# Patient Record
Sex: Male | Born: 1935 | Race: White | Hispanic: No | Marital: Married | Smoking: Former smoker
Health system: Southern US, Community
[De-identification: ages and names within clinical notes are randomized; demographics above are authoritative.]

## PROBLEM LIST (undated history)

## (undated) DIAGNOSIS — K209 Esophagitis, unspecified without bleeding: Secondary | ICD-10-CM

## (undated) DIAGNOSIS — R0989 Other specified symptoms and signs involving the circulatory and respiratory systems: Secondary | ICD-10-CM

## (undated) DIAGNOSIS — R0602 Shortness of breath: Secondary | ICD-10-CM

## (undated) DIAGNOSIS — K529 Noninfective gastroenteritis and colitis, unspecified: Secondary | ICD-10-CM

## (undated) DIAGNOSIS — E785 Hyperlipidemia, unspecified: Secondary | ICD-10-CM

## (undated) DIAGNOSIS — H9190 Unspecified hearing loss, unspecified ear: Secondary | ICD-10-CM

## (undated) DIAGNOSIS — I219 Acute myocardial infarction, unspecified: Secondary | ICD-10-CM

## (undated) DIAGNOSIS — I10 Essential (primary) hypertension: Secondary | ICD-10-CM

## (undated) DIAGNOSIS — J449 Chronic obstructive pulmonary disease, unspecified: Secondary | ICD-10-CM

## (undated) DIAGNOSIS — R9430 Abnormal result of cardiovascular function study, unspecified: Secondary | ICD-10-CM

## (undated) DIAGNOSIS — K5730 Diverticulosis of large intestine without perforation or abscess without bleeding: Secondary | ICD-10-CM

## (undated) DIAGNOSIS — IMO0002 Reserved for concepts with insufficient information to code with codable children: Secondary | ICD-10-CM

## (undated) DIAGNOSIS — R9389 Abnormal findings on diagnostic imaging of other specified body structures: Secondary | ICD-10-CM

## (undated) DIAGNOSIS — G4730 Sleep apnea, unspecified: Secondary | ICD-10-CM

## (undated) DIAGNOSIS — Z720 Tobacco use: Secondary | ICD-10-CM

## (undated) DIAGNOSIS — I2510 Atherosclerotic heart disease of native coronary artery without angina pectoris: Secondary | ICD-10-CM

## (undated) HISTORY — DX: Reserved for concepts with insufficient information to code with codable children: IMO0002

## (undated) HISTORY — DX: Atherosclerotic heart disease of native coronary artery without angina pectoris: I25.10

## (undated) HISTORY — DX: Shortness of breath: R06.02

## (undated) HISTORY — PX: CHOLECYSTECTOMY: SHX55

## (undated) HISTORY — PX: CYST REMOVAL HAND: SHX6279

## (undated) HISTORY — PX: PERCUTANEOUS STENT INTERVENTION: SHX6019

## (undated) HISTORY — PX: APPENDECTOMY: SHX54

## (undated) HISTORY — DX: Tobacco use: Z72.0

## (undated) HISTORY — PX: CATARACT EXTRACTION: SUR2

## (undated) HISTORY — PX: CARDIAC CATHETERIZATION: SHX172

## (undated) HISTORY — DX: Essential (primary) hypertension: I10

## (undated) HISTORY — DX: Noninfective gastroenteritis and colitis, unspecified: K52.9

## (undated) HISTORY — DX: Other specified symptoms and signs involving the circulatory and respiratory systems: R09.89

## (undated) HISTORY — DX: Chronic obstructive pulmonary disease, unspecified: J44.9

## (undated) HISTORY — PX: TONSILLECTOMY: SUR1361

## (undated) HISTORY — DX: Abnormal result of cardiovascular function study, unspecified: R94.30

## (undated) HISTORY — DX: Abnormal findings on diagnostic imaging of other specified body structures: R93.89

## (undated) HISTORY — DX: Hyperlipidemia, unspecified: E78.5

---

## 2000-12-31 ENCOUNTER — Emergency Department (HOSPITAL_COMMUNITY): Admission: EM | Admit: 2000-12-31 | Discharge: 2000-12-31 | Payer: Self-pay | Admitting: Emergency Medicine

## 2000-12-31 ENCOUNTER — Encounter: Payer: Self-pay | Admitting: Emergency Medicine

## 2003-09-15 DIAGNOSIS — I2510 Atherosclerotic heart disease of native coronary artery without angina pectoris: Secondary | ICD-10-CM

## 2003-09-15 HISTORY — DX: Atherosclerotic heart disease of native coronary artery without angina pectoris: I25.10

## 2004-02-16 ENCOUNTER — Emergency Department (HOSPITAL_COMMUNITY): Admission: EM | Admit: 2004-02-16 | Discharge: 2004-02-16 | Payer: Self-pay | Admitting: Emergency Medicine

## 2004-02-18 ENCOUNTER — Inpatient Hospital Stay (HOSPITAL_COMMUNITY): Admission: EM | Admit: 2004-02-18 | Discharge: 2004-02-20 | Payer: Self-pay | Admitting: Emergency Medicine

## 2005-05-05 ENCOUNTER — Ambulatory Visit: Payer: Self-pay | Admitting: Cardiology

## 2005-05-13 ENCOUNTER — Ambulatory Visit: Payer: Self-pay | Admitting: Cardiology

## 2005-07-24 ENCOUNTER — Emergency Department (HOSPITAL_COMMUNITY): Admission: EM | Admit: 2005-07-24 | Discharge: 2005-07-24 | Payer: Self-pay | Admitting: Emergency Medicine

## 2006-07-16 ENCOUNTER — Ambulatory Visit: Payer: Self-pay | Admitting: Cardiology

## 2007-06-06 ENCOUNTER — Encounter: Payer: Self-pay | Admitting: Physician Assistant

## 2007-08-29 ENCOUNTER — Ambulatory Visit: Payer: Self-pay | Admitting: Cardiology

## 2008-10-03 ENCOUNTER — Ambulatory Visit: Payer: Self-pay | Admitting: Cardiology

## 2008-10-08 ENCOUNTER — Encounter: Payer: Self-pay | Admitting: Cardiology

## 2009-09-27 ENCOUNTER — Telehealth (INDEPENDENT_AMBULATORY_CARE_PROVIDER_SITE_OTHER): Payer: Self-pay | Admitting: *Deleted

## 2009-11-20 ENCOUNTER — Encounter: Payer: Self-pay | Admitting: Cardiology

## 2009-11-21 ENCOUNTER — Ambulatory Visit: Payer: Self-pay | Admitting: Cardiology

## 2010-01-06 ENCOUNTER — Telehealth (INDEPENDENT_AMBULATORY_CARE_PROVIDER_SITE_OTHER): Payer: Self-pay | Admitting: *Deleted

## 2010-01-07 ENCOUNTER — Ambulatory Visit: Payer: Self-pay | Admitting: Cardiology

## 2010-01-07 ENCOUNTER — Encounter (INDEPENDENT_AMBULATORY_CARE_PROVIDER_SITE_OTHER): Payer: Self-pay | Admitting: *Deleted

## 2010-01-07 LAB — CONVERTED CEMR LAB
Anion Gap: 11
BUN: 14 mg/dL
CO2: 27 meq/L
Calcium: 8.8 mg/dL
Chloride: 106 meq/L
Creatinine, Ser: 1.36 mg/dL
GFR calc non Af Amer: 52 mL/min
Glucose: 157 mg/dL
HCT: 44.9 %
Hemoglobin: 15.5 g/dL
MCHC: 34.6 g/dL
MCV: 88.8 fL
Platelets: 189 10*3/uL
Potassium: 3.6 meq/L
RBC: 5.06 M/uL
RDW: 14.4 %
Sodium: 140 meq/L
TSH: 0.59 microintl units/mL
WBC: 7.3 10*3/uL

## 2010-01-10 ENCOUNTER — Ambulatory Visit: Payer: Self-pay | Admitting: Cardiology

## 2010-01-13 ENCOUNTER — Encounter: Payer: Self-pay | Admitting: Cardiology

## 2010-01-13 ENCOUNTER — Encounter (INDEPENDENT_AMBULATORY_CARE_PROVIDER_SITE_OTHER): Payer: Self-pay | Admitting: *Deleted

## 2010-01-14 ENCOUNTER — Encounter: Payer: Self-pay | Admitting: Cardiology

## 2010-01-28 ENCOUNTER — Encounter: Payer: Self-pay | Admitting: Cardiology

## 2010-01-29 ENCOUNTER — Ambulatory Visit: Payer: Self-pay | Admitting: Cardiology

## 2010-09-18 ENCOUNTER — Ambulatory Visit (HOSPITAL_COMMUNITY)
Admission: RE | Admit: 2010-09-18 | Discharge: 2010-09-18 | Payer: Self-pay | Source: Home / Self Care | Attending: Ophthalmology | Admitting: Ophthalmology

## 2010-10-14 NOTE — Miscellaneous (Signed)
  Clinical Lists Changes  Problems: Added new problem of COPD (ICD-496) Observations: Added new observation of PAST MED HX: HYPERTENSION, UNSPECIFIED (ICD-401.9) HYPERLIPIDEMIA-MIXED (ICD-272.4) CAD...anterior MI with stent to LAD  1991 /  stent  1996  /  nuclear  no ischemia...2005 EF  55%.....echo  2005 /  55-60%....echo...01/10/2010...could not estimate RV pressure Tobacco abuse SOB  .Marland KitchenMarland KitchenCOPD Abnormal CXRay  12/2009...Marland KitchenMarland KitchenChest CT mod/severe COPD (no mediastinal abnormality)  (01/28/2010 20:29) Added new observation of PRIMARY MD: Dr. Thalia Party McInnis (01/28/2010 20:29)       Past History:  Past Medical History: HYPERTENSION, UNSPECIFIED (ICD-401.9) HYPERLIPIDEMIA-MIXED (ICD-272.4) CAD...anterior MI with stent to LAD  1991 /  stent  1996  /  nuclear  no ischemia...2005 EF  55%.....echo  2005 /  55-60%....echo...01/10/2010...could not estimate RV pressure Tobacco abuse SOB  .Marland KitchenMarland KitchenCOPD Abnormal CXRay  12/2009...Marland KitchenMarland KitchenChest CT mod/severe COPD (no mediastinal abnormality)

## 2010-10-14 NOTE — Assessment & Plan Note (Signed)
Summary: 2 WK   Visit Type:  Follow-up Primary Provider:  Dr. Butch Penny  CC:  shortness of breath.  History of Present Illness: The patient is seen for followup of shortness of breath.  I had seen him on January 07 2010.  At that time we talked about his smoking.  He told me that he would quit.  Today he returns and says that he has not smoked. He admits to the use of a very rare chewing tobacco and says he will complete the quit.  We had ordered a chest x-ray that was read as possibly abnormal.  Followup CT scan was done showing no definite tumor. Echocardiogram was done showing good LV function.  Right heart pressures could not be estimated.  The patient feels much better.  Current Medications (verified): 1)  Lisinopril 10 Mg Tabs (Lisinopril) .... Take 1 Tablet By Mouth Once A Day. Hold For Now. 2)  Simvastatin 40 Mg Tabs (Simvastatin) .... Take 1 Tablet By Mouth Once A Day 3)  Metoprolol Tartrate 50 Mg Tabs (Metoprolol Tartrate) .... Take 1/2 Tablet By Mouth Once A Day 4)  Aspirin 81 Mg Tbec (Aspirin) .... Take One Tablet By Mouth Daily 5)  Isosorbide Mononitrate Cr 60 Mg Xr24h-Tab (Isosorbide Mononitrate) .... Take One Tablet By Mouth Daily. Hold For Now.  Allergies (verified): No Known Drug Allergies  Comments:  Nurse/Medical Assistant: The patient's medications and allergies were reviewed with the patient and were updated in the Medication and Allergy Lists. Bottles reviewed.  Past History:  Past Medical History: Last updated: 01/28/2010 HYPERTENSION, UNSPECIFIED (ICD-401.9) HYPERLIPIDEMIA-MIXED (ICD-272.4) CAD...anterior MI with stent to LAD  1991 /  stent  1996  /  nuclear  no ischemia...2005 EF  55%.....echo  2005 /  55-60%....echo...01/10/2010...could not estimate RV pressure Tobacco abuse SOB  .Marland KitchenMarland KitchenCOPD Abnormal CXRay  12/2009...Marland KitchenMarland KitchenChest CT mod/severe COPD (no mediastinal abnormality)  Review of Systems       Patient denies fever, chills, headache, sweats, rash,  change in vision, change in hearing, chest pain, cough, nausea vomiting, urinary symptoms.  All of the systems are reviewed and are negative  Vital Signs:  Patient profile:   75 year old male Height:      72 inches Weight:      201 pounds Pulse rate:   66 / minute BP sitting:   116 / 80  (left arm) Cuff size:   large  Vitals Entered By: Carlye Grippe (Jan 29, 2010 1:28 PM)  Physical Exam  General:  patient is stable. Eyes:  no xanthelasma. Neck:  no jugular venous distention. Lungs:  lungs reveal decreased breath sounds. Heart:  cardiac exam reveals S1 and S2.  There no clicks or significant murmurs. Abdomen:  The abdomen is soft. Extremities:  no peripheral edema. Psych:  patient is oriented to person time and place.  Affect is normal.   Impression & Recommendations:  Problem # 1:  COPD (ICD-496) By CT scanning the patient appears to have significant COPD.  He should follow with his primary team concerning this.  He feels well currently  Problem # 2:  SHORTNESS OF BREATH (ICD-786.05)  His updated medication list for this problem includes:    Lisinopril 10 Mg Tabs (Lisinopril) .Marland Kitchen... Take 1 tablet by mouth once a day. hold for now.    Metoprolol Tartrate 50 Mg Tabs (Metoprolol tartrate) .Marland Kitchen... Take 1/2 tablet by mouth once a day    Aspirin 81 Mg Tbec (Aspirin) .Marland Kitchen... Take one tablet by mouth daily I believe  that his recent shortness of breath was related to his pulmonary disease.  He has good LV function.  Problem # 3:  HYPERTENSION, UNSPECIFIED (ICD-401.9)  His updated medication list for this problem includes:    Lisinopril 10 Mg Tabs (Lisinopril) .Marland Kitchen... Take 1 tablet by mouth once a day. hold for now.    Metoprolol Tartrate 50 Mg Tabs (Metoprolol tartrate) .Marland Kitchen... Take 1/2 tablet by mouth once a day    Aspirin 81 Mg Tbec (Aspirin) .Marland Kitchen... Take one tablet by mouth daily Blood pressure is under good control.  No change in therapy.  Problem # 4:  CAD, NATIVE VESSEL  (ICD-414.01)  His updated medication list for this problem includes:    Lisinopril 10 Mg Tabs (Lisinopril) .Marland Kitchen... Take 1 tablet by mouth once a day. hold for now.    Metoprolol Tartrate 50 Mg Tabs (Metoprolol tartrate) .Marland Kitchen... Take 1/2 tablet by mouth once a day    Aspirin 81 Mg Tbec (Aspirin) .Marland Kitchen... Take one tablet by mouth daily    Isosorbide Mononitrate Cr 60 Mg Xr24h-tab (Isosorbide mononitrate) .Marland Kitchen... Take one tablet by mouth daily. hold for now.    Nitrostat 0.4 Mg Subl (Nitroglycerin) .Marland Kitchen... 1 tablet under tongue at onset of chest pain; you may repeat every 5 minutes for up to 3 doses. Coronary disease is stable.  I've chosen not to do an exercise test at this point.  I will see him in followup and reconsider later.  Patient Instructions: 1)  Your physician wants you to follow-up in: 1 year. You will receive a reminder letter in the mail one-two months in advance. If you don't receive a letter, please call our office to schedule the follow-up appointment. 2)  Your physician recommends that you continue on your current medications as directed. Please refer to the Current Medication list given to you today. Prescriptions: NITROSTAT 0.4 MG SUBL (NITROGLYCERIN) 1 tablet under tongue at onset of chest pain; you may repeat every 5 minutes for up to 3 doses.  #25 x 3   Entered by:   Cyril Loosen, RN, BSN   Authorized by:   Talitha Givens, MD, [facility]   Signed by:   Cyril Loosen, RN, BSN on 01/29/2010   Method used:   Electronically to        [facility] # [phone]* (retail)       [address]       [place], [place]  [address]       Ph: [phone] or [phone]       Fax: [phone]   RxID:   [id number]

## 2010-10-14 NOTE — Progress Notes (Signed)
Summary: SOB   Phone Note Call from Patient   Summary of Call: Not feeling good x 2-3 days.  Does notice more in the evening.  Sleeps good.  Stated he has funny feeling in chest after coming back inside.  Not sure if related to breathing in the cold air or not.  No fever.  Cough occas, but not productive.  Does notice some SOB after this occas.  Did schedule appt. for first available (March 10).  Did suggest seeing his PMD first, but advised him that we would send note to MD also.  Patient verbalized understanding.  Initial call taken by: Hoover Brunette, LPN,  September 27, 2009 9:59 AM  Follow-up for Phone Call        Agree  Talitha Givens, MD, [facility]  Jan  Additional Follow-up for Phone Call Additional follow up Details #1::        Pt.'s wife notified.  Additional Follow-up by: Hoover Brunette, LPN,  October 02, 2009 9:04 AM

## 2010-10-14 NOTE — Assessment & Plan Note (Signed)
Summary: 1 YR FU PER JAN REMINDER-SRS   Visit Type:  Follow-up Primary Provider:  Dr. Butch Penny  CC:  CAD.  History of Present Illness: Patient is seen for followup of coronary disease.  He is stable.  He has not had any significant chest pain.  He did have an episode of shingles that was treated early.  He is going about full activities.  He stopped smoking 20 years ago.  He does continue to chew tobacco very infrequently and I encouraged him to stop completely.  Preventive Screening-Counseling & Management  Alcohol-Tobacco     Smoking Status: quit     Year Started: 1949     Year Quit: 1990  Comments: Pt chews tobacco. 1/2 pk of tobacco/day  Current Medications (verified): 1)  Alprazolam 0.5 Mg Tabs (Alprazolam) .... Take 1/2-1 Tablet By Mouth At Bedtime 2)  Lisinopril 10 Mg Tabs (Lisinopril) .... Take 1 Tablet By Mouth Once A Day 3)  Simvastatin 40 Mg Tabs (Simvastatin) .... Take 1 Tablet By Mouth Once A Day 4)  Metoprolol Tartrate 50 Mg Tabs (Metoprolol Tartrate) .... Take 1/2 Tablet By Mouth Once A Day 5)  Aspirin 81 Mg Tbec (Aspirin) .... Take One Tablet By Mouth Daily 6)  Isosorbide Mononitrate Cr 60 Mg Xr24h-Tab (Isosorbide Mononitrate) .... Take One Tablet By Mouth Daily 7)  Valacyclovir Hcl 1 Gm Tabs (Valacyclovir Hcl) .... Take 1 Tablet By Mouth Three Times A Day For Shingles  Allergies (verified): No Known Drug Allergies  Comments:  Nurse/Medical Assistant: The patient's medications were reviewed with the patient and were updated in the Medication List. Pt brought medication bottles to office visit.  Cyril Loosen, RN, BSN (November 21, 2009 1:46 PM)  Past History:  Past Medical History: Last updated: 11/20/2009 HYPERTENSION, UNSPECIFIED (ICD-401.9) HYPERLIPIDEMIA-MIXED (ICD-272.4) CAD...anterior MI with stent to LAD  1991 /  stent  1996  /  nuclear  no ischemia...2005 EF  55%.....echo  2005 Tobacco abuse  Social History: Smoking Status:  quit  Review  of Systems       Patient denies fever, chills, headache, sweats, rash, change in vision, change in hearing, chest pain, cough, nausea vomiting, urinary symptoms.  All other systems are reviewed and are negative.  Vital Signs:  Patient profile:   75 year old male Height:      72 inches Weight:      203.25 pounds BMI:     27.67 Pulse rate:   69 / minute BP sitting:   114 / 67  (left arm) Cuff size:   regular  Vitals Entered By: Cyril Loosen, RN, BSN (November 21, 2009 1:40 PM)  Nutrition Counseling: Patient's BMI is greater than 25 and therefore counseled on weight management options. CC: CAD Comments Follow up visit   Physical Exam  General:  patient is stable in general. Eyes:  no xanthelasma. Neck:  no jugular venous distention. Lungs:  lungs are clear.  Respiratory effort is not labored. Heart:  cardiac exam reveals S1-S2.  No clicks or significant murmurs. Abdomen:  abdomen is soft. Extremities:  no peripheral edema. Psych:  patient is oriented to person time and place.  Affect is normal.   Impression & Recommendations:  Problem # 1:  TOBACCO ABUSE (ICD-305.1) The patient stopped smoking 20 years ago.  He does chew tobacco rarely.  I counseled him to stop completely.  Problem # 2:  HYPERLIPIDEMIA-MIXED (ICD-272.4)  His updated medication list for this problem includes:    Simvastatin 40 Mg Tabs (Simvastatin) .Marland KitchenMarland KitchenMarland KitchenMarland Kitchen  Take 1 tablet by mouth once a day Patient's lipids are being treated.  No change in therapy.  Problem # 3:  CAD, NATIVE VESSEL (ICD-414.01)  His updated medication list for this problem includes:    Lisinopril 10 Mg Tabs (Lisinopril) .Marland Kitchen... Take 1 tablet by mouth once a day    Metoprolol Tartrate 50 Mg Tabs (Metoprolol tartrate) .Marland Kitchen... Take 1/2 tablet by mouth once a day    Aspirin 81 Mg Tbec (Aspirin) .Marland Kitchen... Take one tablet by mouth daily    Isosorbide Mononitrate Cr 60 Mg Xr24h-tab (Isosorbide mononitrate) .Marland Kitchen... Take one tablet by mouth  daily  Orders: EKG w/ Interpretation (93000)  Artery disease is stable.  Is not having any significant symptoms.  I feel that exercise testing is not necessary.  EKG is done today and reviewed by me.  There is no significant change.  Follow up in one year.  Patient Instructions: 1)  Your physician recommends that you continue on your current medications as directed. Please refer to the Current Medication list given to you today. 2)  Your physician wants you to follow-up in: . You will receive a reminder letter in the mail about two months in advance. If you don't receive a letter, please call our office to schedule the follow-up appointment.

## 2010-10-14 NOTE — Assessment & Plan Note (Signed)
Summary: ROV   Visit Type:  SOB Primary Provider:  Dr. Butch Penny  CC:  sob and dizziness.  History of Present Illness: The patient is here today as an add-on.  His wife called and stated he had some shortness of breath.  He had some shortness of breath at rest.  It seems to be most notable when he is in his recliner.  It is not affecting his activity.  He does not have true PND or orthopnea.  He has a history of good LV function.  His last echo was done in 2005.  He has coronary disease.  His last nuclear study exercise test was in 2005.  He's not had any significant chest pain.  Patient has also had generalized fatigue.  However he seems to remain active.  Preventive Screening-Counseling & Management  Alcohol-Tobacco     Smoking Status: quit     Year Quit: 1990  Current Medications (verified): 1)  Alprazolam 0.5 Mg Tabs (Alprazolam) .... Take 1/2-1 Tablet By Mouth At Bedtime 2)  Lisinopril 10 Mg Tabs (Lisinopril) .... Take 1 Tablet By Mouth Once A Day 3)  Simvastatin 40 Mg Tabs (Simvastatin) .... Take 1 Tablet By Mouth Once A Day 4)  Metoprolol Tartrate 50 Mg Tabs (Metoprolol Tartrate) .... Take 1/2 Tablet By Mouth Once A Day 5)  Aspirin 81 Mg Tbec (Aspirin) .... Take One Tablet By Mouth Daily 6)  Isosorbide Mononitrate Cr 60 Mg Xr24h-Tab (Isosorbide Mononitrate) .... Take One Tablet By Mouth Daily  Allergies (verified): No Known Drug Allergies  Past History:  Past Medical History: Last updated: 11/20/2009 HYPERTENSION, UNSPECIFIED (ICD-401.9) HYPERLIPIDEMIA-MIXED (ICD-272.4) CAD...anterior MI with stent to LAD  1991 /  stent  1996  /  nuclear  no ischemia...2005 EF  55%.....echo  2005 Tobacco abuse  Review of Systems       Patient denies fever, chills, headache, sweats, rash, change in vision, change in hearing, chest pain, cough, nausea vomiting, urinary symptoms.  All of the systems are reviewed and are negative.  Vital Signs:  Patient profile:   75 year old  male Height:      72 inches Weight:      199 pounds Pulse rate:   67 / minute Pulse (ortho):   73 / minute BP sitting:   98 / 65  (left arm) BP standing:   102 / 68 Cuff size:   large  Vitals Entered By: Carlye Grippe (January 07, 2010 1:59 PM)  Serial Vital Signs/Assessments:  Time      Position  BP       Pulse  Resp  Temp     By           Lying LA  113/67   69                    Carlye Grippe           Sitting   104/68   [address]           Standing  102/68   73                    Carlye Grippe           Standing  103/71   68  Carlye Grippe           Standing  469/62   95                    Carlye Grippe  CC: sob,dizziness   Physical Exam  General:  The patient is stable today. Head:  head is atraumatic. Eyes:  no xanthelasma. Neck:  no jugular venous distention.  No carotid bruits. Chest Wall:  no chest wall tenderness. Lungs:  lungs are clear.  Respiratory effort is nonlabored. Heart:  cardiac exam reveals S1 and S2.  No clicks or significant murmurs. Abdomen:  abdomen is soft. Msk:  no musculoskeletal deformities. Extremities:  no peripheral edema. Skin:  no skin rashes. Psych:  patient is oriented to person time and place.  Affect is normal.   Impression & Recommendations:  Problem # 1:  TOBACCO ABUSE (ICD-305.1) Patient has not smoked for greater than 20 years.  He does chew tobacco infrequently.  Today I asked him to stop, he said he would.  Problem # 2:  HYPERTENSION, UNSPECIFIED (ICD-401.9)  His updated medication list for this problem includes:    Lisinopril 10 Mg Tabs (Lisinopril) .Marland Kitchen... Take 1 tablet by mouth once a day. hold for now.    Metoprolol Tartrate 50 Mg Tabs (Metoprolol tartrate) .Marland Kitchen... Take 1/2 tablet by mouth once a day    Aspirin 81 Mg Tbec (Aspirin) .Marland Kitchen... Take one tablet by mouth daily Historically the patient has been hypertensive.  He has relative hypotension now.  We will put his lisinopril  and Imdur on hold.  Orders: T-CBC No Diff ([id number]) T-Basic Metabolic Panel ([id number]) T-TSH ([id number])  Problem # 3:  HYPERLIPIDEMIA-MIXED (ICD-272.4)  His updated medication list for this problem includes:    Simvastatin 40 Mg Tabs (Simvastatin) .Marland Kitchen... Take 1 tablet by mouth once a day The patient's lipids are being treated.  Problem # 4:  CAD, NATIVE VESSEL (ICD-414.01)  His updated medication list for this problem includes:    Lisinopril 10 Mg Tabs (Lisinopril) .Marland Kitchen... Take 1 tablet by mouth once a day. hold for now.    Metoprolol Tartrate 50 Mg Tabs (Metoprolol tartrate) .Marland Kitchen... Take 1/2 tablet by mouth once a day    Aspirin 81 Mg Tbec (Aspirin) .Marland Kitchen... Take one tablet by mouth daily    Isosorbide Mononitrate Cr 60 Mg Xr24h-tab (Isosorbide mononitrate) .Marland Kitchen... Take one tablet by mouth daily. hold for now.  Orders: EKG w/ Interpretation (93000) T-Chest x-ray, 2 views (71020) T-CBC No Diff ([id number]) T-Basic Metabolic Panel ([id number]) T-TSH ([id number]) 2-D Echocardiogram (2D Echo) Patient has known coronary disease.  His last nuclear scan was done in 2005.  I will consider a followup nuclear scan after I see him next.  Problem # 5:  SHORTNESS OF BREATH (ICD-786.05)  His updated medication list for this problem includes:    Lisinopril 10 Mg Tabs (Lisinopril) .Marland Kitchen... Take 1 tablet by mouth once a day. hold for now.    Metoprolol Tartrate 50 Mg Tabs (Metoprolol tartrate) .Marland Kitchen... Take 1/2 tablet by mouth once a day    Aspirin 81 Mg Tbec (Aspirin) .Marland Kitchen... Take one tablet by mouth daily This is a new problem for him.  He has this at rest.  I'm not convinced that he is fluid overloaded.  I am not convinced that this represents heart failure or ischemia.  EKG is done today and reviewed by me.  There is sinus rhythm with no acute changes.  Etiology is not clear.  Patient will have  chemistry and hematology and a TSH.  We'll also have chest x-ray and 2-D echo.  All then see him for a  followup.  Orders: T-Chest x-ray, 2 views (71020) T-CBC No Diff ([id number]) T-Basic Metabolic Panel ([id number]) T-TSH ([id number]) 2-D Echocardiogram (2D Echo)  Patient Instructions: 1)  Your physician recommends that you go to the [facility] for lab work. 2)  A chest x-ray takes a picture of the organs and structures inside the chest, including the heart, lungs, and blood vessels. This test can show several things, including, whether the heart is enlarged; whether fluid is building up in the lungs; and whether pacemaker / defibrillator leads are still in place. 3)  Your physician has requested that you have an echocardiogram.  Echocardiography is a painless test that uses sound waves to create images of your heart. It provides your doctor with information about the size and shape of your heart and how well your heart's chambers and valves are working.  This procedure takes approximately one hour. There are no restrictions for this procedure. 4)  Hold Lisinopril and Imdur (isosorbide) for now. 5)  Follow up appointment in 2 weeks.

## 2010-10-14 NOTE — Miscellaneous (Signed)
Summary: LABS ENTERED IN FLOWSHEET  Clinical Lists Changes  Observations: Added new observation of MCHC RBC: 34.6 g/dL (16/06/9603 5:40) Added new observation of PLATELETK/UL: 189 K/uL (01/07/2010 9:45) Added new observation of RDW: 14.4 % (01/07/2010 9:45) Added new observation of MCV: 88.8 fL (01/07/2010 9:45) Added new observation of HCT: 44.9 % (01/07/2010 9:45) Added new observation of HGB: 15.5 g/dL (98/07/9146 8:29) Added new observation of RBC M/UL: 5.06 M/uL (01/07/2010 9:45) Added new observation of WBC COUNT: 7.3 10*3/microliter (01/07/2010 9:45) Added new observation of TSH: 0.59 microintl units/mL (01/07/2010 9:45) Added new observation of CALCIUM: 8.8 mg/dL (56/21/3086 5:78) Added new observation of GFR: 52 mL/min (01/07/2010 9:45) Added new observation of CREATININE: 1.36 mg/dL (46/96/2952 8:41) Added new observation of BUN: 14 mg/dL (32/44/0102 7:25) Added new observation of ANION GAP: 11.0  (01/07/2010 9:45) Added new observation of CO2 PLSM/SER: 27.0 meq/L (01/07/2010 9:45) Added new observation of CL SERUM: 106 meq/L (01/07/2010 9:45) Added new observation of K SERUM: 3.6 meq/L (01/07/2010 9:45) Added new observation of NA: 140 meq/L (01/07/2010 9:45) Added new observation of GLU MON POC: 157 mg/dL (36/64/4034 7:42)

## 2010-10-14 NOTE — Progress Notes (Signed)
Summary: Patient is SOB  Phone Note Call from Patient Call back at Home Phone [phone] Call back at [phone]   Caller: wife calling Reason for Call: Talk to Nurse Summary of Call: complaint SOB from walking.   Patient's wife feels he needs to see someone does not want to take to his primary.  No other complaints related to his heart.   Initial call taken by: Claudette Laws,  January 06, 2010 12:25 PM  Follow-up for Phone Call        SOB since Friday with activity only. Patient didn't have problem on Sunday,denies chest pain. Paient is working out in garden today. Nurse advised  her that he did need to be evaluated either by PCP or ED. Wife insist that patient see My[facility]. Wife verbalized understanding of plan. Patient is willing to come to [place] to see MD if possible. Follow-up by: Carlye Grippe,  January 06, 2010 1:10 PM  Additional Follow-up for Phone Call Additional follow up Details #1::        Have him come to office this afternoon. I will see him. Call me to tell me if / when  he is coming.    Additional Follow-up for Phone Call Additional follow up Details #2::    called and informd wife  to have patient here today at 1:30pm.  Follow-up by: Carlye Grippe,  January 07, 2010 11:59 AM

## 2010-10-14 NOTE — Miscellaneous (Signed)
Summary: CT CHEST WITH CONTRAST  Clinical Lists Changes  Orders: Added new Referral order of CT Scan with Contrast (CTw/contrast) - Signed

## 2010-10-14 NOTE — Miscellaneous (Signed)
  Clinical Lists Changes  Problems: Added new problem of TOBACCO ABUSE (ICD-305.1) Observations: Added new observation of PAST MED HX: HYPERTENSION, UNSPECIFIED (ICD-401.9) HYPERLIPIDEMIA-MIXED (ICD-272.4) CAD...anterior MI with stent to LAD  1991 /  stent  1996  /  nuclear  no ischemia...2005 EF  55%.....echo  2005 Tobacco abuse (11/20/2009 17:16) Added new observation of PRIMARY MD: McInnis (11/20/2009 17:16)       Past History:  Past Medical History: HYPERTENSION, UNSPECIFIED (ICD-401.9) HYPERLIPIDEMIA-MIXED (ICD-272.4) CAD...anterior MI with stent to LAD  1991 /  stent  1996  /  nuclear  no ischemia...2005 EF  55%.....echo  2005 Tobacco abuse

## 2010-11-24 LAB — BASIC METABOLIC PANEL
GFR calc Af Amer: >60 mL/min (ref >60)
GFR calc non Af Amer: 58 mL/min — ABNORMAL LOW (ref >60)
Glucose, Bld: 118 mg/dL — ABNORMAL HIGH (ref 70–99)
Potassium: 3.6 mEq/L (ref 3.5–5.1)
Sodium: 142 mEq/L (ref 135–145)

## 2010-11-24 LAB — HEMOGLOBIN AND HEMATOCRIT, BLOOD
HCT: 40.4 % (ref 39.0–52.0)
Hemoglobin: 14.6 g/dL (ref 13.0–17.0)

## 2011-01-09 ENCOUNTER — Other Ambulatory Visit: Payer: Self-pay | Admitting: Cardiology

## 2011-01-09 ENCOUNTER — Other Ambulatory Visit: Payer: Self-pay | Admitting: *Deleted

## 2011-01-09 MED ORDER — SIMVASTATIN 40 MG PO TABS: 40.0000 mg | ORAL_TABLET | Freq: Every evening | ORAL | Status: DC

## 2011-01-09 MED ORDER — METOPROLOL TARTRATE 50 MG PO TABS: 50.0000 mg | ORAL_TABLET | Freq: Every day | ORAL | Status: DC

## 2011-01-27 NOTE — Assessment & Plan Note (Signed)
[facility]                          [facility]   NAME:Jeremy [name]                       MRN:          [id number]  DATE:08/29/2007                            DOB:          08-24-1936    [name] has known coronary disease. I saw him last in 2007. He is  here for a yearly followup. He is not having any chest pain. He is  active. He is going about full activities. He has stopped smoking.  However, I understand today that he is chewing tobacco and he and his  wife had a long discussion about this. He thought he had been told that  chewing tobacco did not have the negative effects of smoking and I  explained that it would have less effect on lung cancer and other  issues, but that from a cardiac viewpoint, the nicotine was still  harmful to his endocardium.   PAST MEDICAL HISTORY:   ALLERGIES:  No known drug allergies.   MEDICATIONS:  1. Fish oil.  2. Isosorbide.  3. Lopressor.  4. Simvastatin.  5. Lisinopril.  6. Aspirin.   OTHER MEDICAL PROBLEMS:  See the list below.   REVIEW OF SYSTEMS:  He is going about full activity. He has a mild  cough, but is stable in that regard. His review of systems otherwise is  negative.   PHYSICAL EXAMINATION:  Weight is 201 pounds, blood pressure 118/72 with  a pulse of 57. The patient is oriented to person, time and place. Affect  is normal. He is here with his wife today.  LUNGS:  Clear. Respiratory effort is not labored.  CARDIAC: Reveals an S1, S2. There are no clicks or significant murmurs.  ABDOMEN: Soft. There are no masses or bruits.  He has no peripheral edema.   EKG: Reveals mild sinus bradycardia.   PROBLEM LIST:  1. Coronary disease status post anterior myocardial infarction with      stent to the left anterior descending artery in 1991.  2. Status post stenting again to the left anterior descending artery      again in 1996.  3. Cardiolite in 2005 with no ischemia.  4. Dyslipidemia on medications.  5. Borderline hypertension. His blood pressure is nicely controlled      today.  6. History of smoking in the past which he stopped. However, he does      chew tobacco and we had a long discussion about this. He does need      to have followup lipids done. He actually had labs in September      2008. His LDL was 84 and his HDL is 32. He is not at this point      inclined to use Niaspan. This should be considered over time. I      have chosen not to change his medications based on these labs.   [name] is stable. I have strongly urged him to stop chewing tobacco  and we will see him back in one year.     Luis Abed, MD, [facility]  Electronically Signed  JDK/MedQ  DD: 08/29/2007  DT: 08/29/2007  Job #: [id number]   cc:   Angus G. Renard Matter, MD

## 2011-01-27 NOTE — Assessment & Plan Note (Signed)
[facility]                          [facility]   NAME:Jeremy [name]                       MRN:          [id number]  DATE:10/03/2008                            DOB:          08-04-1936    [name] is here for Cardiology followup.  He has known coronary  artery disease.  I saw him last in December 2008.  At that time, I had  spoken with him about chewing tobacco.  We had another long discussion  about quitting today.  I documented well on my last note that we had  discussed this.  He does not remember.  He is taking his meds very  reliably.  He had an anterior MI with a stent to the LAD in 1991.  He  had no ischemia by Cardiolite in 2005.  His ejection fraction at that  time was good.  His last echo was also in 2005.   ALLERGIES:  No known drug allergies.   MEDICATIONS:  Fish oil, isosorbide, metoprolol, simvastatin, lisinopril,  and aspirin.   OTHER MEDICAL PROBLEMS:  See the list below.   REVIEW OF SYSTEMS:  He is not having any GI or GU symptoms.  He has no  fevers, skin rashes, or weight loss.  He has no headaches.  His review  of systems is negative.   PHYSICAL EXAMINATION:  VITAL SIGNS:  Blood pressure is 120/80 with a  pulse of 65.  His weight is 200.  GENERAL:  The patient is oriented to person, time, and place.  Affect is  normal.  HEENT:  No xanthelasma.  He has normal extraocular motion.  There are no  carotid bruits.  There is no jugular venous distention.  LUNGS:  Clear.  Respiratory effort is not labored.  CARDIAC:  S1 and S2.  There are no clicks or significant murmurs.  ABDOMEN:  Soft.  EXTREMITIES:  He has no peripheral edema.   EKG is unchanged.  He has some mild nonspecific ST-T wave changes.   PROBLEMS INCLUDE:  1. Coronary disease post anterior MI with a stent to the LAD in 1991.      He then had stenting again to the LAD in 1996.  2. Cardiolite in 2005.  No ischemia.  3. Dyslipidemia.  I have  arranged for fasting lipid profile.  4. Borderline hypertension.  Blood pressure is well controlled.  5. History of smoking.  He did stop smoking, but he continues to chew      tobacco.  I had an extensive discussion with the patient today.      With his wife in the room, we talked at length about stopping      smokeless tobacco.  He did not commit to this, but he said he would      try to improve.   Overall, he is doing well.  I have not arranged for any testing other  than his lipid profile.  This will be carefully reviewed when we obtain  it.  I will see him for Cardiology followup in 1 year.     Tinnie Gens  Sedonia Small, MD, [facility]  Electronically Signed    JDK/MedQ  DD: 10/03/2008  DT: 10/04/2008  Job #: [id number]   cc:   Angus G. Renard Matter, MD

## 2011-01-29 ENCOUNTER — Encounter: Payer: Self-pay | Admitting: Cardiology

## 2011-01-30 ENCOUNTER — Encounter: Payer: Self-pay | Admitting: Cardiology

## 2011-01-30 DIAGNOSIS — Z720 Tobacco use: Secondary | ICD-10-CM | POA: Insufficient documentation

## 2011-01-30 DIAGNOSIS — I10 Essential (primary) hypertension: Secondary | ICD-10-CM | POA: Insufficient documentation

## 2011-01-30 DIAGNOSIS — I2510 Atherosclerotic heart disease of native coronary artery without angina pectoris: Secondary | ICD-10-CM | POA: Insufficient documentation

## 2011-01-30 DIAGNOSIS — E782 Mixed hyperlipidemia: Secondary | ICD-10-CM | POA: Insufficient documentation

## 2011-01-30 DIAGNOSIS — R9389 Abnormal findings on diagnostic imaging of other specified body structures: Secondary | ICD-10-CM | POA: Insufficient documentation

## 2011-01-30 DIAGNOSIS — J449 Chronic obstructive pulmonary disease, unspecified: Secondary | ICD-10-CM | POA: Insufficient documentation

## 2011-01-30 DIAGNOSIS — E785 Hyperlipidemia, unspecified: Secondary | ICD-10-CM | POA: Insufficient documentation

## 2011-01-30 DIAGNOSIS — R9430 Abnormal result of cardiovascular function study, unspecified: Secondary | ICD-10-CM | POA: Insufficient documentation

## 2011-01-30 NOTE — Discharge Summary (Signed)
NAME:  Jeremy [name], Jeremy [name] NO.:  [id number]   MEDICAL RECORD NO.:  [id number]                   PATIENT TYPE:  INP   LOCATION:  A226                                 FACILITY:  [facility]   PHYSICIAN:  Angus G. Renard Matter, M.D.              DATE OF BIRTH:  04-Dec-1935   DATE OF ADMISSION:  02/18/2004  DATE OF DISCHARGE:  02/20/2004                                 DISCHARGE SUMMARY   DIAGNOSES:  1. Chest pain, possible unstable angina.  2. History of coronary artery disease status post stent placement.  3. Hyperlipidemia.  4. Allergic dermatitis.  5. Normocytic anemia.   DISCHARGE CONDITION:  The patient is stable and improved at the time of  discharge.   REASON FOR ADMISSION:  This is a 75 year old white male who has a history of  coronary artery disease and hyperlipidemia and came to the emergency room  with the above complaint of chest pain.  The patient claims to have had two  heart attacks ten to 12 years prior to this admission and had stent  placement.  On the day of admission he had three episodes of chest pain  which lasted a couple of minutes. He took nitroglycerin from a friend and  this relieved his pain. He came to the emergency room with another episode  of chest pain.  He had no nausea and no diaphoresis.  The patient's pain was  mainly in the substernal area and was described as pressure.  The patient  has had itching and has some type of dermatitis over his skin for the last  few days and was given Benadryl.   PHYSICAL EXAMINATION ON ADMISSION:  GENERAL:  Alert male.  VITAL SIGNS:  Blood pressure 115/79, pulse 90, respirations 20, temperature  98.8.  HEENT:  Eyes:  Pupils equal, round and reactive to light and accommodation.  TM's are negative. Oropharynx is benign.  The neck is supple without JVD or  thyroid abnormalities.  LUNGS:  Clear to percussion and auscultation.  HEART:  Regular rhythm with no murmurs.  ABDOMEN:  No palpable organs or  masses.  EXTREMITIES:  Free of edema.   LABORATORY DATA:  Admission CBC showed a WBC of 13,400, hemoglobin 15.8,  hematocrit 45.7.  Chemistries:  Sodium 142, potassium 3.4, chloride 108, C02  25, glucose 125, BUN 20, creatinine 1.4, calcium 8.8, total protein 6.8 and  albumin 4.0.  Liver enzymes:  SGOT 19, SGPT 16, alkaline phosphatase 70,  bilirubin 0.8.  CPK 24, CPK MB 1.5, Troponin 0.02.   Chest x-ray:  Questionable COPD, minimal right basilar atelectasis.  Electrocardiogram:  Normal sinus rhythm, rate 88, sinus bradycardia on one  EKG.   HOSPITAL COURSE:  The patient was placed on a low cholesterol diet.  Cardiac  enzymes were monitored. He was continued on aspirin 325 mg daily, p.r.n.  Nitrostat 0.4 mg and the patient was continued on Zocor  20 mg daily,  lisinopril 10 mg daily, isosorbide 60 mg daily, metoprolol 50 mg daily,  Pepcid 40 mg daily and Benadryl 50 mg p.r.n. for rash and itching.  The  patient had minimal chest pain following admission. He did have significant  symptoms secondary to dermatitis on his hands and trunk. He was given Solu-  Medrol 125 mg intravenously and subsequently prednisone 60 mg daily and  Aveeno bath was given p.r.n. He was placed on Zantac 150 mg b.i.d.  The  patient was seen in consultation by Dr. Dionicio Stall who felt that he should  be evaluated with reference to his heart, but this could be done as an  outpatient. He felt that the patient had a significant allergic contact type  dermatitis and also felt that the chest pain was very atypical.  He felt  that he should be aggressively treated for the rash and dermatitis.  He felt  that he could be followed up as an outpatient after leaving the hospital  with reference to the chest pain.  He was discharged on the second hospital  day.   DISCHARGE MEDICATIONS:  1. Allegra 180 mg daily.  2. Prednisone taper.  3. Zocor 20 mg at bedtime.  4. Lisinopril 10 mg daily.  5. Imdur 60 mg daily.  6.  Metoprolol 50 mg daily.  7. Aspirin 81 mg daily.  8. Pepcid 40 mg daily.  9. P.r.n. nitroglycerin.   FOLLOW UP:  1. A stress test was scheduled to be done in [place], [place].     ___________________________________________                                         Ishmael Holter. Renard Matter, M.D.   AGM/MEDQ  D:  03/03/2004  T:  03/03/2004  Job:  [id number]   cc:   Vida Roller, M.D.  Fax: [phone]

## 2011-01-30 NOTE — Group Therapy Note (Signed)
NAME:  Jeremy [name], CHARTERS NO.:  [id number]   MEDICAL RECORD NO.:  [id number]                   PATIENT TYPE:  INP   LOCATION:  A226                                 FACILITY:  [facility]   PHYSICIAN:  Angus G. Renard Matter, M.D.              DATE OF BIRTH:  [birth year]   DATE OF PROCEDURE:  DATE OF DISCHARGE:                                   PROGRESS NOTE   This patient was admitted through the ED with chest pain.  He was admitted  to rule out coronary artery disease.  He has had minimal chest pain since  admission.  He has had problems with itching, secondary to the dermatitis  which he has.  The stress test was put on hold because of the skin problems.   OBJECTIVE:  VITAL SIGNS:  Blood pressure 95/48; respirations 20; pulse 63;  temperature 97.  HEART:  Regular rhythm.  LUNGS:  Clear to P&A.  ABDOMEN:  No palpable organs or masses.   ASSESSMENT:  The patient has coronary artery disease and hyperlipidemia.   PLAN:  Continue steroids, continue current regimen.      ___________________________________________                                            Ishmael Holter. Renard Matter, M.D.   AGM/MEDQ  D:  02/20/2004  T:  02/20/2004  Job:  [id number]

## 2011-01-30 NOTE — H&P (Signed)
NAME:  Jeremy [name], MAKIN NO.:  [id number]   MEDICAL RECORD NO.:  [id number]                   PATIENT TYPE:  INP   LOCATION:  A226                                 FACILITY:  [facility]   PHYSICIAN:  Tesfaye D. Felecia Shelling, M.D.              DATE OF BIRTH:  [birth year]   DATE OF ADMISSION:  02/18/2004  DATE OF DISCHARGE:                                HISTORY & PHYSICAL   CHIEF COMPLAINT:  Chest pain.   HISTORY OF PRESENT ILLNESS:  This is a 75 year old male patient with a  history of coronary artery disease and hyperlipidemia who came to the  emergency room with the above complaint.  The patient claims he had two  heart attacks about 10 or 12 years back, and had stent placement.  He used  to have regular cardiology followup six months to one year.  He has been  taking his medication regularly, and he has never had chest pain recently.  Today, the patient had three episodes of chest pain which lasted for a  couple of minutes.  He took nitroglycerin from a friend, and his pain was  relieved.  He came to the emergency room and had another chest pain, and he  was given nitroglycerin, and the pain went away.  The patient had no nausea,  vomiting or diaphoresis.  The pain was mainly on substernal area, and it was  like pressure.  The patient also complains of thrush and itching for the  last three days.  He had severe rash and hives about three days and came  into the emergency room.  He was given Benadryl.  However, he continued to  have frequent itching and still the thrush has persisted.   REVIEW OF SYSTEMS:  No headache, fever, cough, shortness of breath, nausea,  vomiting, abdominal pain, dysuria, urgency or frequency of urination.   PAST MEDICAL HISTORY:  1. Coronary artery disease and history of myocardial infarction about 10 or     12 years back.  2. Status post stent placement.  3. Hyperlipidemia.  4. Status post cholecystectomy.   CURRENT MEDICATIONS:  1.  Zocor 20 mg p.o. daily.  2. Lisinopril 10 mg p.o. daily.  3. Isosorbide 60 mg p.o. daily.  4. Metoprolol 50 mg p.o. daily.  5. Pepcid 40 mg p.o. daily.  6. Benadryl 25 mg p.o. p.r.n. for rash and itching.   SOCIAL HISTORY:  The patient is married.  He chews tobacco.  No history of  tobacco smoking, alcohol abuse or substance abuse.  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   PHYSICAL EXAMINATION:  GENERAL:  The patient is alert, awake and pleasant  looking.  VITAL SIGNS:  Blood pressure 115/79, pulse 90, respiratory rate 22,  temperature 98.8 degrees Fahrenheit.  HEENT:  Pupils are equal, reactive.  NECK:  Supple.  CHEST:  Clear lung fields, good air  entry.  CARDIOVASCULAR SYSTEMS:  The first and second heart sounds heard. No murmur,  no gallop.  ABDOMEN:  Soft and relaxed.  Bowel sounds are positive.  No mass or  organomegaly.  EXTREMITIES:  No leg edema.   LABORATORY DATA:  CBC:  WBC 8.8, hemoglobin 10.5, hematocrit 31.4.  Platelets 119,000.  Sodium 134, potassium 4.1.  Chloride 100.  Carbon  dioxide 24.  Glucose 102,  BUN 21, creatinine 1.4.  Calcium 8.1.  PSA 716.   ASSESSMENT:  1. Chest pain to rule out unstable angina.  2. Coronary artery disease, status post stent placement.  3. Hyperlipidemia.  4. Allergic dermatitis.  5. Normocytic anemia, etiology unclear.  6. Elevated PSA.   PLAN:  1. EKG, cardiac enzymes q.8 x3.  2. Continue to monitor on telemetry.  3. Cardiology consultations.  4. Continue the patient on his regular medications.  5. Urology consultation for abnormal PSA.  6. Anemia workup.  7. Continue his regular treatment.     ___________________________________________                                         Eustaquio Maize. Felecia Shelling, M.D.   TDF/MEDQ  D:  02/18/2004  T:  02/18/2004  Job:  [id number]

## 2011-01-30 NOTE — Assessment & Plan Note (Signed)
[facility]                            [facility] NOTE   NAME:Jeremy [name], Jeremy [name]                       MRN:          [id number]  DATE:07/16/2006                            DOB:          Nov 12, 1935    PRIMARY CARDIOLOGIST:  Luis Abed, M.D.   REASON FOR VISIT:  Scheduled annual follow up.   Jeremy [name] is a very pleasant 75 year old male with known coronary artery  disease who now presents for annual follow up.  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   The patient denies any chest discomfort associated with his vigorous  activities.   Electrocardiogram today reveals normal sinus rhythm with left axis deviation  and small Q-waves in the high lateral leads with no ischemic changes.   CURRENT MEDICATIONS:  1. Aspirin 81 mg daily.  2. Imdur 60 daily.  3. Lisinopril 10 daily.  4. Simvastatin 40 q.h.s.  5. Metoprolol 25 daily.   PHYSICAL EXAMINATION:  Blood pressure 130/82, pulse 73, regular, weight 194.  GENERAL:  A 75 year old male sitting upright in no apparent distress.  HEENT:  Normocephalic and atraumatic.  NECK:  Palpable bilateral carotid pulses without bruits.  LUNGS:  Clear to auscultation in all fields.  HEART:  Regular rate and rhythm.  S1-S2.  No murmurs, gallops, or rubs.  ABDOMEN:  Soft and nontender with intact bowel sounds and no bruits or  pulsatile mass.  EXTREMITIES:  Palpable distal pulses without edema.  There are no focal  deficits.   IMPRESSION:  1. Coronary artery disease, status post anterior myocardial      infarction/stent left anterior descending artery.      a.     Status post anterior myocardial infarction in 1991.      b.     Status post stenting of left anterior descending artery in 1996.      c.     Non-ischemic stress Cardiolite with normal  left ventricular June       2005.  2. Dyslipidemia.  3. Borderline hypertension.  4. Remote tobacco.   PLAN:  1. Schedule annual follow-up fasting lipid/liver profile.  2. Return clinic follow up with Dr. Luis Abed in one year.      Gene Serpe, PA-C  Electronically Signed     ______________________________  Luis Abed, MD, [facility]   GS/MedQ  DD: 07/16/2006  DT: 07/17/2006  Job #: [id number]   cc:   Angus G. Renard Matter, MD

## 2011-01-30 NOTE — Group Therapy Note (Signed)
NAME:  Jeremy [name], Jeremy [name] NO.:  [id number]   MEDICAL RECORD NO.:  [id number]                   PATIENT TYPE:  INP   LOCATION:  A226                                 FACILITY:  [facility]   PHYSICIAN:  Angus G. Renard Matter, M.D.              DATE OF BIRTH:  [birth year]   DATE OF PROCEDURE:  DATE OF DISCHARGE:                                   PROGRESS NOTE   This patient was admitted through the ED with chest pain.  He was admitted  to rule out coronary artery disease.  His cardiac markers have remained  normal.   OBJECTIVE:  VITAL SIGNS:  Blood pressure 115/62; respirations 18; pulse 86;  temperature 97.3.  LUNGS:  Clear to P&A.  HEART:  Regular rhythm.  ABDOMEN:  No palpable organs or masses.   ASSESSMENT:  The patient was admitted with chest pain and hyperlipidemia to  rule out coronary artery disease.  The patient does have elevated PSA.   PLAN:  Proceed with a cardiology consult and urology consult.      ___________________________________________                                            Ishmael Holter. Renard Matter, M.D.   AGM/MEDQ  D:  02/19/2004  T:  02/19/2004  Job:  [id number]

## 2011-01-30 NOTE — Procedures (Signed)
NAME:  Jeremy [name], Jeremy [name] NO.:  [id number]   MEDICAL RECORD NO.:  [id number]                   PATIENT TYPE:  INP   LOCATION:  A226                                 FACILITY:  [facility]   PHYSICIAN:  Vida Roller, M.D.                DATE OF BIRTH:  1936/07/29   DATE OF PROCEDURE:  02/19/2004  DATE OF DISCHARGE:                                  ECHOCARDIOGRAM   PRIMARY CARE PHYSICIAN:  Angus G. Renard Matter, M.D.   Tape Number [id number].  Tape count 0 through 433.   A 75 year old man with known coronary artery disease comes in with chest  pain.  This is assessment for ischemia.  Technical quality of the study is  limited.   M-MODE TRACINGS:  1. The aorta is 35 mm.  2. Left atrium is 42 mm.  3. Septum is 14 mm.  4. Posterior wall is 13 mm.  5. Left ventricular diastolic dimension 50 mm.  6. Left ventricular systolic dimension 36 mm.   2-D AND DOPPLER IMAGING:  1. The left ventricle is normal size with preserved left ventricular     systolic function, estimated ejection fraction 50 to 55%.  There is mild     inferior hypokinesis which is difficult to assess due to the technical     limitations of the study.  Diastolic function was not adequately     assessed.  There is mild concentric left ventricular hypertrophy.  2. The right ventricle appears to be top normal in size with normal systolic     function.  3. Both atria appear to be mildly enlarged, left greater than right.  There     is no obvious atrial septal defect.  4. The aortic valve was sclerotic with no stenosis or regurgitation.  5. The mitral valve is morphologically unremarkable with trivial     insufficiency.  No stenosis is seen.  6. The tricuspid valve is morphologically unremarkable with trace     insufficiency, and no stenosis is seen.  7. Pulmonic valve not well seen.  8. Inferior vena cava appears to be normal size.  9. Pericardial structures appear normal.  10.      Aorta not well  assessed.      ___________________________________________                                            Vida Roller, M.D.   JH/MEDQ  D:  02/19/2004  T:  02/19/2004  Job:  [id number]

## 2011-01-30 NOTE — Consult Note (Signed)
NAME:  Jeremy [name], Jeremy [name] NO.:  [id number]   MEDICAL RECORD NO.:  [id number]                   PATIENT TYPE:  INP   LOCATION:  A226                                 FACILITY:  [facility]   PHYSICIAN:  Vida Roller, M.D.                DATE OF BIRTH:  [birth year]   DATE OF CONSULTATION:  02/19/2004  DATE OF DISCHARGE:                                   CONSULTATION   CARDIOLOGIST:  Willa Rough, M.D., primarily followed in [place].   HISTORY OF PRESENT ILLNESS:  Mr. Paver is a 75 year old male with known  coronary disease who presented to the emergency room complaining of chest  discomfort.  Reports three episodes of chest pain at home with exertion, two  at rest.  The episodes lasted about 4-5 minutes and were separated by about  an hour.  He did take a sublingual nitroglycerin with relief of the third  symptom.  Chest pain was described as pressure substernally without  radiation.  No exacerbating or relieving symptoms or diaphoresis.  He had  been told to stop his aspirin because he has a relatively severe cutaneous  reaction which he thinks was from exposure to grass.  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   PAST MEDICAL HISTORY:  1. Coronary disease, myocardial infarction back in 1990 and again in 1996.     Status post percutaneous revascularization at that time, the details of     which are unavailable to me.  Stress test two years ago which was normal,     also, not available to me.  2. Hyperlipidemia.  3. Hypertension.   SOCIAL HISTORY:  Lives in [place] with his wife.  He is a Visual merchandiser.  He is married.  He has three boys.  He used to smoke cigarettes, but he  quite after his second heart attack.  He now chews tobacco.   FAMILY HISTORY:  Father died of a myocardial infarction at 59, mother of  unknown  causes at 18.  He has two brothers, both of whom have had bypass  surgeries.   REVIEW OF SYSTEMS:  Generally, negative.   MEDICATIONS PRIOR TO ADMISSION:  1. Zocor 20 mg.  2. Lisinopril 10 mg.  3. Imdur 60 mg.  4. Metoprolol 50 mg.  5. Pepcid 40 mg.  6. Aspirin 325 mg.  7. Benadryl.   HOSPITAL MEDICATIONS:  1. Aspirin 325.  2. Pepcid 40.  3. Imdur 60.  4. Claritin 10.  5. Lopressor 50.  6. Zocor 20.  7. Ambien 5.  8. Nitroglycerin p.r.n.  9. One dose of Solu-Medrol 125 IM when he presented to ER.   PHYSICAL EXAMINATION:  GENERAL APPEARANCE:  Well-developed, well-nourished  white male in no apparent distress.  Alert and oriented x4.  VITAL SIGNS:  Pulse 51, respirations 20, blood pressure 104/66, afebrile,  weight 194 pounds.  He does have a generalized eruption with hives all over  his body, most pronounced in his arms, but also in his legs and across his  chest.  He does not have any oral lesions.  HEENT:  His dentition is reasonably poor secondary to tobacco chewing.  NECK:  No jugular venous distention or carotid bruits.  CHEST:  Clear to auscultation bilaterally.  CARDIAC:  Regular without any ST-T wave changes.  ABDOMEN:  Soft, nontender.  GU/RECTAL:  Deferred.  EXTREMITIES:  Pulses are 2+ through.  He has no bruits.  NEUROLOGICAL:  Nonfocal.  MUSCULOSKELETAL:  Nonfocal.   STUDIES:  He has not had a chest x-ray.  Electrocardiogram shows sinus  rhythm at a rate of 88 with normal intervals, normal axis.  No ST-T wave  changes concerning for ischemia with a nonspecific pattern and no Q-wave  concerning for an old myocardial infarction.   IMPRESSION:  This is a gentleman with significant cutaneous reaction which  looks to be allergic, probably a contact dermatitis.  He has chest pain with  known coronary disease, but the chest pain is very atypical, and he has been  pretty stable for the last few years on medical therapy.  Hyperlipidemia  which is reasonably well  treated and mild dysphagia.   PLAN:  Aggressively treat his cutaneous rash. I  think he probably needs  steroids for this and a steroid taper.  This is relatively impressive.  Also, will need Benadryl probably or a nonsedating version thereof.  Will  obtain his old records to assess other things.  He probably needs an  echocardiogram to assess his left ventricular function.  If that is normal  and his enzymes are negative, I think he can probably have an outpatient  evaluation once his cutaneous rash is resolved.  This is not a gentleman  that I would necessarily want to give a radiopharmaceutical to with his  rash.  If it is not contraindicated, we could certainly do it, but I think  his chest pain is very unlikely to be coronary in etiology.  In any case, we  will continue to follow him here in the hospital.      ___________________________________________                                            Vida Roller, M.D.   JH/MEDQ  D:  02/19/2004  T:  02/19/2004  Job:  [id number]

## 2011-02-02 ENCOUNTER — Encounter: Payer: Self-pay | Admitting: Cardiology

## 2011-02-02 ENCOUNTER — Ambulatory Visit (INDEPENDENT_AMBULATORY_CARE_PROVIDER_SITE_OTHER): Payer: Medicare Other | Admitting: Cardiology

## 2011-02-02 DIAGNOSIS — I2510 Atherosclerotic heart disease of native coronary artery without angina pectoris: Secondary | ICD-10-CM

## 2011-02-02 DIAGNOSIS — F17200 Nicotine dependence, unspecified, uncomplicated: Secondary | ICD-10-CM

## 2011-02-02 DIAGNOSIS — I10 Essential (primary) hypertension: Secondary | ICD-10-CM

## 2011-02-02 DIAGNOSIS — Z720 Tobacco use: Secondary | ICD-10-CM

## 2011-02-02 MED ORDER — NITROGLYCERIN 0.4 MG SL SUBL: 0.4000 mg | SUBLINGUAL_TABLET | SUBLINGUAL | Status: DC | PRN

## 2011-02-02 NOTE — Assessment & Plan Note (Signed)
Patient's blood pressure is controlled.  No change in therapy.

## 2011-02-02 NOTE — Progress Notes (Signed)
HPI Patient is seen for followup of coronary disease.  Is not having any problems.  His blood pressure is under good control.  There is no chest pain or shortness of breath.  He takes his medicines reliably  .No Known Allergies  Current Outpatient Prescriptions  Medication Sig Dispense Refill  . aspirin 81 MG tablet Take 81 mg by mouth daily.        . metoprolol (LOPRESSOR) 50 MG tablet take 1/2 tablet by mouth once daily  45 tablet  3  . nitroGLYCERIN (NITROSTAT) 0.4 MG SL tablet Place 0.4 mg under the tongue every 5 (five) minutes as needed.        . simvastatin (ZOCOR) 40 MG tablet Take 1 tablet (40 mg total) by mouth every evening.  30 tablet  1  . DISCONTD: simvastatin (ZOCOR) 40 MG tablet Take 40 mg by mouth at bedtime.        Marland Kitchen DISCONTD: isosorbide mononitrate (IMDUR) 60 MG 24 hr tablet Take 60 mg by mouth daily.        Marland Kitchen DISCONTD: lisinopril (PRINIVIL,ZESTRIL) 10 MG tablet Take 10 mg by mouth daily.        Marland Kitchen DISCONTD: metoprolol (LOPRESSOR) 50 MG tablet Take 1 tablet (50 mg total) by mouth daily.  30 tablet  1  . DISCONTD: metoprolol (TOPROL-XL) 50 MG 24 hr tablet Take 50 mg by mouth daily.        Marland Kitchen DISCONTD: simvastatin (ZOCOR) 40 MG tablet take 1 tablet by mouth once daily  90 tablet  3    History   Social History  . Marital Status: Married    Spouse Name: N/A    Number of Children: N/A  . Years of Education: N/A   Occupational History  . Not on file.   Social History Main Topics  . Smoking status: Former Smoker -- 3.0 packs/day for 12 years    Types: Cigarettes    Quit date: 09/15/1995  . Smokeless tobacco: Current User    Types: Chew   Comment: chews one-half pack tobacco per day  . Alcohol Use: Not on file  . Drug Use: Not on file  . Sexually Active: Not on file   Other Topics Concern  . Not on file   Social History Narrative  . No narrative on file    No family history on file.  Past Medical History  Diagnosis Date  . Hypertension   . Hyperlipidemia    . CAD (coronary artery disease) 2005    anterior MI with stent to the LAD in 1991 / stent 1996 / nuclear 2005, no ischemia  . Abnormal CXR 4/20011    Chest  CT mode/serere COPD ( no mediastinal abnormality)  . Ejection fraction     55-60%, echo, April, 2011, could not estimate RV pressure  . Tobacco abuse   . COPD (chronic obstructive pulmonary disease)   . Shortness of breath     No past surgical history on file.  ROS  Patient denies fever, chills, headache, sweats, rash, change in vision, change in hearing, chest pain, cough, nausea vomiting, urinary symptoms.  All other systems are reviewed and are negative  PHYSICAL EXAM Patient is stable.  He is here with his wife.  He is oriented to person time and place.  Affect is normal.  Head is atraumatic.  There is no xanthelasma.  Lungs reveal some scattered rhonchi.  There is no respiratory distress.  Cardiac exam reveals an S1-S2.  No clicks or significant  murmurs.  The abdomen is soft.  There is no peripheral edema. Filed Vitals:   02/02/11 1524  BP: 110/71  Pulse: 71  Height: 6' (1.829 m)  Weight: 198 lb (89.812 kg)    EKG EKG is done today and reviewed by me it is normal.  ASSESSMENT & PLAN

## 2011-02-02 NOTE — Assessment & Plan Note (Signed)
Coronary disease is stable. No change in therapy. 

## 2011-02-02 NOTE — Patient Instructions (Addendum)
Continue all current medications. Your physician wants you to follow up in:  1 year.  You will receive a reminder letter in the mail one-two months in advance.  If you don't receive a letter, please call our office to schedule the follow up appointment   

## 2011-02-02 NOTE — Assessment & Plan Note (Signed)
Patient continues to smoke a small amount.  I have counseled him to stop.

## 2011-06-12 ENCOUNTER — Other Ambulatory Visit (HOSPITAL_COMMUNITY): Payer: Self-pay | Admitting: Family Medicine

## 2011-06-12 ENCOUNTER — Ambulatory Visit (HOSPITAL_COMMUNITY)
Admission: RE | Admit: 2011-06-12 | Discharge: 2011-06-12 | Disposition: A | Discharge status: Home / Self Care | Payer: Medicare Other | Source: Ambulatory Visit | Attending: Family Medicine | Admitting: Family Medicine

## 2011-06-12 DIAGNOSIS — R109 Unspecified abdominal pain: Secondary | ICD-10-CM

## 2011-06-12 DIAGNOSIS — M51379 Other intervertebral disc degeneration, lumbosacral region without mention of lumbar back pain or lower extremity pain: Secondary | ICD-10-CM | POA: Insufficient documentation

## 2011-06-12 DIAGNOSIS — R319 Hematuria, unspecified: Secondary | ICD-10-CM | POA: Insufficient documentation

## 2011-06-12 DIAGNOSIS — K5730 Diverticulosis of large intestine without perforation or abscess without bleeding: Secondary | ICD-10-CM | POA: Insufficient documentation

## 2011-06-12 DIAGNOSIS — M5137 Other intervertebral disc degeneration, lumbosacral region: Secondary | ICD-10-CM | POA: Insufficient documentation

## 2011-06-12 DIAGNOSIS — N200 Calculus of kidney: Secondary | ICD-10-CM | POA: Insufficient documentation

## 2011-06-12 DIAGNOSIS — R1031 Right lower quadrant pain: Secondary | ICD-10-CM | POA: Insufficient documentation

## 2011-11-24 ENCOUNTER — Telehealth: Payer: Self-pay | Admitting: *Deleted

## 2011-11-24 DIAGNOSIS — Z79899 Other long term (current) drug therapy: Secondary | ICD-10-CM

## 2011-11-24 DIAGNOSIS — E785 Hyperlipidemia, unspecified: Secondary | ICD-10-CM

## 2011-11-24 NOTE — Telephone Encounter (Signed)
Pt left message at [phone] asking for paperwork to be sent so he can have some lab work done.  Spoke with pt who states he had FLP/LFT done year before last but not last year. He would like an order sent so that he can have this done. Order will be faced to the [facility] center. Pt is aware to be fasting for this lab work.

## 2012-01-06 ENCOUNTER — Other Ambulatory Visit: Payer: Self-pay | Admitting: *Deleted

## 2012-01-06 MED ORDER — SIMVASTATIN 40 MG PO TABS: 40.0000 mg | ORAL_TABLET | Freq: Every evening | ORAL | Status: DC

## 2012-01-06 MED ORDER — METOPROLOL TARTRATE 50 MG PO TABS: 25.0000 mg | ORAL_TABLET | Freq: Every day | ORAL | Status: DC

## 2012-02-25 ENCOUNTER — Ambulatory Visit (INDEPENDENT_AMBULATORY_CARE_PROVIDER_SITE_OTHER): Payer: Medicare Other | Admitting: Cardiology

## 2012-02-25 ENCOUNTER — Encounter: Payer: Self-pay | Admitting: Cardiology

## 2012-02-25 VITALS — BP 133/67 | HR 63 | Ht 72.0 in | Wt 200.0 lb

## 2012-02-25 DIAGNOSIS — I10 Essential (primary) hypertension: Secondary | ICD-10-CM

## 2012-02-25 DIAGNOSIS — E785 Hyperlipidemia, unspecified: Secondary | ICD-10-CM

## 2012-02-25 DIAGNOSIS — I2510 Atherosclerotic heart disease of native coronary artery without angina pectoris: Secondary | ICD-10-CM

## 2012-02-25 DIAGNOSIS — F17200 Nicotine dependence, unspecified, uncomplicated: Secondary | ICD-10-CM

## 2012-02-25 DIAGNOSIS — Z720 Tobacco use: Secondary | ICD-10-CM

## 2012-02-25 DIAGNOSIS — R0989 Other specified symptoms and signs involving the circulatory and respiratory systems: Secondary | ICD-10-CM

## 2012-02-25 MED ORDER — METOPROLOL TARTRATE 25 MG PO TABS: 25.0000 mg | ORAL_TABLET | Freq: Every day | ORAL | Status: DC

## 2012-02-25 MED ORDER — SIMVASTATIN 40 MG PO TABS: 40.0000 mg | ORAL_TABLET | Freq: Every evening | ORAL | Status: DC

## 2012-02-25 MED ORDER — NITROGLYCERIN 0.4 MG SL SUBL: 0.4000 mg | SUBLINGUAL_TABLET | SUBLINGUAL | Status: DC | PRN

## 2012-02-25 NOTE — Assessment & Plan Note (Signed)
Coronary disease is stable. He does not need any exercise testing at this time. Same therapy.

## 2012-02-25 NOTE — Assessment & Plan Note (Signed)
I talked with the patient about his chewing tobacco. I urged him to stop.

## 2012-02-25 NOTE — Progress Notes (Signed)
   HPI  Patient returns today for followup of coronary artery disease. I saw him last one year ago. He's not having any significant chest pain. He works every day. He is still chewing some tobacco. I talk with him about that and I urged him to stop. He said he will try. He had a stent to his LAD in 1991. Nuclear study in 2005 revealed no ischemia. Echo in April, 2011 revealed an ejection fraction of 55%. I have carefully reviewed the records and there is no record of a carotid Doppler.  No Known Allergies  Current Outpatient Prescriptions  Medication Sig Dispense Refill  . aspirin 81 MG tablet Take 81 mg by mouth daily.        . metoprolol (LOPRESSOR) 50 MG tablet Take 0.5 tablets (25 mg total) by mouth daily.  45 tablet  3  . nitroGLYCERIN (NITROSTAT) 0.4 MG SL tablet Place 1 tablet (0.4 mg total) under the tongue every 5 (five) minutes as needed.  25 tablet  3  . simvastatin (ZOCOR) 40 MG tablet Take 1 tablet (40 mg total) by mouth every evening.  30 tablet  2    History   Social History  . Marital Status: Married    Spouse Name: N/A    Number of Children: N/A  . Years of Education: N/A   Occupational History  . Not on file.   Social History Main Topics  . Smoking status: Former Smoker -- 3.0 packs/day for 12 years    Types: Cigarettes    Quit date: 09/15/1995  . Smokeless tobacco: Current User    Types: Chew   Comment: chews one-half pack tobacco per day  . Alcohol Use: Not on file  . Drug Use: Not on file  . Sexually Active: Not on file   Other Topics Concern  . Not on file   Social History Narrative  . No narrative on file    No family history on file.  Past Medical History  Diagnosis Date  . Hypertension   . Hyperlipidemia   . CAD (coronary artery disease) 2005    anterior MI with stent to the LAD in 1991 / stent 1996 / nuclear 2005, no ischemia  . Abnormal CXR 4/20011    Chest  CT mode/serere COPD ( no mediastinal abnormality)  . Ejection fraction    55-60%, echo, April, 2011, could not estimate RV pressure  . Tobacco abuse   . COPD (chronic obstructive pulmonary disease)   . Shortness of breath     No past surgical history on file.  ROS   Patient denies fever, chills, headache, sweats, rash, change in vision, change in hearing, chest pain, cough, nausea vomiting, urinary symptoms. All other systems are reviewed and are negative.  PHYSICAL EXAM   Patient's ear with his wife. He is oriented to person time and place. Affect is normal. There is no jugulovenous distention. There is question of a soft left carotid bruit. Lungs are clear. Respiratory effort is nonlabored. Cardiac exam reveals S1 and S2. There no clicks or significant murmurs. The abdomen is soft. There is no peripheral edema. There are no musculoskeletal deformities. There are no skin rashes.  Filed Vitals:   02/25/12 0824  BP: 133/67  Pulse: 63  Height: 6' (1.829 m)  Weight: 200 lb (90.719 kg)   EKG is done today and reviewed by me. There is normal sinus rhythm. There is no significant change from the prior tracing.  ASSESSMENT & PLAN

## 2012-02-25 NOTE — Assessment & Plan Note (Signed)
Blood pressure is controlled. No change in therapy. 

## 2012-02-25 NOTE — Assessment & Plan Note (Signed)
The patient takes his cholesterol medication. We do not have labs in greater than one year. Fasting lipid will be obtained.

## 2012-02-25 NOTE — Patient Instructions (Signed)
Your physician recommends that you go to the [facility] lab for blood work for fasting lipid and liver panel.  Reminder:  Nothing to eat or drink after 12 midnight prior to labs. Carotid Dopplers If the results of your test are normal or stable, you will receive a letter.  If they are abnormal, the nurse will contact you by phone. Your physician wants you to follow up in:  1 year.  You will receive a reminder letter in the mail one-two months in advance.  If you don't receive a letter, please call our office to schedule the follow up appointment

## 2012-02-25 NOTE — Assessment & Plan Note (Signed)
There is question of a soft left carotid bruit. The patient has known coronary artery disease. I have no records of a carotid Doppler being done at any time. This is necessary and will be scheduled. Otherwise I'll see him back in one year.

## 2012-03-09 ENCOUNTER — Encounter (INDEPENDENT_AMBULATORY_CARE_PROVIDER_SITE_OTHER): Payer: Medicare Other

## 2012-03-09 DIAGNOSIS — I6529 Occlusion and stenosis of unspecified carotid artery: Secondary | ICD-10-CM

## 2012-03-09 DIAGNOSIS — R0989 Other specified symptoms and signs involving the circulatory and respiratory systems: Secondary | ICD-10-CM

## 2012-03-15 ENCOUNTER — Encounter: Payer: Self-pay | Admitting: Cardiology

## 2012-03-15 ENCOUNTER — Telehealth: Payer: Self-pay | Admitting: *Deleted

## 2012-03-15 NOTE — Telephone Encounter (Signed)
Patient's wife informed

## 2012-03-15 NOTE — Telephone Encounter (Signed)
Message copied by Eustace Moore on Tue Mar 15, 2012  2:09 PM ------      Message from: Myrtis Ser, [place] D      Created: Thu Mar 10, 2012  5:19 PM       Please let him know that his lipids are good. Continue the same medication

## 2012-03-15 NOTE — Telephone Encounter (Signed)
Left message on machine for patient to call office.

## 2012-03-15 NOTE — Telephone Encounter (Signed)
Message copied by Eustace Moore on Tue Mar 15, 2012  5:02 PM ------      Message from: [place], [place] D      Created: Tue Mar 15, 2012  2:23 PM       Please let him know that Doppler of his carotid shows only very slight abnormality. This is a very minor finding

## 2012-03-16 NOTE — Telephone Encounter (Signed)
Patient's wife informed

## 2012-07-14 ENCOUNTER — Encounter (INDEPENDENT_AMBULATORY_CARE_PROVIDER_SITE_OTHER): Payer: Self-pay | Admitting: *Deleted

## 2012-08-17 ENCOUNTER — Other Ambulatory Visit (INDEPENDENT_AMBULATORY_CARE_PROVIDER_SITE_OTHER): Payer: Self-pay | Admitting: *Deleted

## 2012-08-17 DIAGNOSIS — Z1211 Encounter for screening for malignant neoplasm of colon: Secondary | ICD-10-CM

## 2012-08-18 ENCOUNTER — Encounter (INDEPENDENT_AMBULATORY_CARE_PROVIDER_SITE_OTHER): Payer: Self-pay | Admitting: *Deleted

## 2012-08-18 ENCOUNTER — Telehealth (INDEPENDENT_AMBULATORY_CARE_PROVIDER_SITE_OTHER): Payer: Self-pay | Admitting: *Deleted

## 2012-08-18 DIAGNOSIS — Z1211 Encounter for screening for malignant neoplasm of colon: Secondary | ICD-10-CM

## 2012-08-18 MED ORDER — PEG-KCL-NACL-NASULF-NA ASC-C 100 G PO SOLR: 1.0000 | Freq: Once | ORAL | Status: DC

## 2012-08-18 NOTE — Telephone Encounter (Signed)
Patient needs movi prep 

## 2012-09-08 ENCOUNTER — Telehealth (INDEPENDENT_AMBULATORY_CARE_PROVIDER_SITE_OTHER): Payer: Self-pay | Admitting: *Deleted

## 2012-09-08 NOTE — Telephone Encounter (Signed)
agree

## 2012-09-08 NOTE — Telephone Encounter (Signed)
  Procedure: tcs  Reason/Indication:  screening  Has patient had this procedure before?  no  If so, when, by whom and where?    Is there a family history of colon cancer?  no  Who?  What age when diagnosed?    Is patient diabetic?   no      Does patient have prosthetic heart valve?  no  Do you have a pacemaker?  no  Has patient had joint replacement within last 12 months?  no  Is patient on Coumadin, Plavix and/or Aspirin? yes  Medications: asa 81 mg daily, simvastatin 40 mg daily, metoprolol 50 mg 1/2 tab daily, advil pm PRN  Allergies: nkda  Medication Adjustment: asa 2 days  Procedure date & time: 10/05/12 at 1030

## 2012-09-20 ENCOUNTER — Encounter (HOSPITAL_COMMUNITY): Payer: Self-pay | Admitting: Pharmacy Technician

## 2012-09-28 ENCOUNTER — Ambulatory Visit (HOSPITAL_COMMUNITY)
Admission: RE | Admit: 2012-09-28 | Discharge: 2012-09-28 | Disposition: A | Discharge status: Home / Self Care | Payer: Medicare Other | Source: Ambulatory Visit | Attending: Family Medicine | Admitting: Family Medicine

## 2012-09-28 ENCOUNTER — Other Ambulatory Visit (HOSPITAL_COMMUNITY): Payer: Self-pay | Admitting: Family Medicine

## 2012-09-28 DIAGNOSIS — R079 Chest pain, unspecified: Secondary | ICD-10-CM | POA: Insufficient documentation

## 2012-09-28 DIAGNOSIS — R0602 Shortness of breath: Secondary | ICD-10-CM | POA: Insufficient documentation

## 2012-10-05 ENCOUNTER — Encounter (HOSPITAL_COMMUNITY)
Admission: RE | Disposition: A | Discharge status: Home / Self Care | Payer: Self-pay | Source: Ambulatory Visit | Attending: Internal Medicine

## 2012-10-05 ENCOUNTER — Ambulatory Visit (HOSPITAL_COMMUNITY)
Admission: RE | Admit: 2012-10-05 | Discharge: 2012-10-05 | Disposition: A | Discharge status: Home / Self Care | Payer: Medicare Other | Source: Ambulatory Visit | Attending: Internal Medicine | Admitting: Internal Medicine

## 2012-10-05 ENCOUNTER — Encounter (HOSPITAL_COMMUNITY): Payer: Self-pay

## 2012-10-05 DIAGNOSIS — K644 Residual hemorrhoidal skin tags: Secondary | ICD-10-CM

## 2012-10-05 DIAGNOSIS — Z1211 Encounter for screening for malignant neoplasm of colon: Secondary | ICD-10-CM

## 2012-10-05 DIAGNOSIS — D126 Benign neoplasm of colon, unspecified: Secondary | ICD-10-CM

## 2012-10-05 DIAGNOSIS — I10 Essential (primary) hypertension: Secondary | ICD-10-CM | POA: Insufficient documentation

## 2012-10-05 DIAGNOSIS — D128 Benign neoplasm of rectum: Secondary | ICD-10-CM | POA: Insufficient documentation

## 2012-10-05 DIAGNOSIS — D129 Benign neoplasm of anus and anal canal: Secondary | ICD-10-CM | POA: Insufficient documentation

## 2012-10-05 DIAGNOSIS — K5730 Diverticulosis of large intestine without perforation or abscess without bleeding: Secondary | ICD-10-CM

## 2012-10-05 DIAGNOSIS — J449 Chronic obstructive pulmonary disease, unspecified: Secondary | ICD-10-CM | POA: Insufficient documentation

## 2012-10-05 DIAGNOSIS — J4489 Other specified chronic obstructive pulmonary disease: Secondary | ICD-10-CM | POA: Insufficient documentation

## 2012-10-05 HISTORY — PX: COLONOSCOPY: SHX5424

## 2012-10-05 SURGERY — COLONOSCOPY
Anesthesia: Moderate Sedation

## 2012-10-05 MED ORDER — SODIUM CHLORIDE 0.45 % IV SOLN
INTRAVENOUS | Status: DC
  Administered 2012-10-05: 09:00:00 via INTRAVENOUS

## 2012-10-05 MED ORDER — MEPERIDINE HCL 50 MG/ML IJ SOLN
INTRAMUSCULAR | Status: AC
  Filled 2012-10-05: qty 1

## 2012-10-05 MED ORDER — MIDAZOLAM HCL 5 MG/5ML IJ SOLN
INTRAMUSCULAR | Status: AC
  Filled 2012-10-05: qty 10

## 2012-10-05 MED ORDER — MEPERIDINE HCL 50 MG/ML IJ SOLN
INTRAMUSCULAR | Status: DC | PRN
  Administered 2012-10-05 (×2): 25 mg via INTRAVENOUS

## 2012-10-05 MED ORDER — MIDAZOLAM HCL 5 MG/5ML IJ SOLN
INTRAMUSCULAR | Status: DC | PRN
  Administered 2012-10-05 (×3): 1 mg via INTRAVENOUS
  Administered 2012-10-05: 2 mg via INTRAVENOUS

## 2012-10-05 MED ORDER — STERILE WATER FOR IRRIGATION IR SOLN
Status: DC | PRN
  Administered 2012-10-05: 10:00:00

## 2012-10-05 NOTE — Op Note (Signed)
COLONOSCOPY PROCEDURE REPORT  PATIENT:  Jeremy [name]  MR#:  [id number] Birthdate:  June 20, 1936, 77 y.o., male Endoscopist:  Dr. Malissa Hippo, MD Referred By:  Dr. Alice Reichert, MD Procedure Date: 10/05/2012  Procedure:   Colonoscopy with snare polypectomy.  Indications:  Patient is 77 year old Caucasian male who is undergoing average risk screening colonoscopy. This is patient's first exam.  Informed Consent:  The procedure and risks were reviewed with the patient and informed consent was obtained.  Medications:  Demerol 50 mg IV Versed 5 mg IV  Description of procedure:  After a digital rectal exam was performed, that colonoscope was advanced from the anus through the rectum and colon to the area of the cecum, ileocecal valve and appendiceal orifice. The cecum was deeply intubated. These structures were well-seen and photographed for the record. From the level of the cecum and ileocecal valve, the scope was slowly and cautiously withdrawn. The mucosal surfaces were carefully surveyed utilizing scope tip to flexion to facilitate fold flattening as needed. The scope was pulled down into the rectum where a thorough exam including retroflexion was performed.  Findings:   Prep satisfactory. Multiple diverticula noted at sigmoid colon. Two small polyps ablated via cold biopsy and submitted together(transverse and descending colon). Three small polyps ablated via cold biopsy from proximal sigmoid colon and submitted together. One polyp had surface erosion. Two  polyps measuring 7 mm and 5 mm snared and submitted together(still sigmoid colon and rectosigmoid junction. Small hemorrhoids below the dentate line.  Therapeutic/Diagnostic Maneuvers Performed:  See above  Complications:  None  Cecal Withdrawal Time:  28 minutes  Impression:  Examination performed to cecum. Multiple diverticula at sigmoid colon. 7 polyps were removed as follows;    -Two were ablated via cold biopsy and  submitted together      as above.     -Three were ablated via cold biopsy from proximal                   sigmoid and submitted together.      -Two polyps were snared and submitted together(distal           sigmoid colon and rectosigmoid junction). Small external hemorrhoids.  Recommendations:  Standard instructions given. I will contact patient with biopsy results and further recommendations.  REHMAN,NAJEEB U  10/05/2012 10:27 AM  CC: Dr. Alice Reichert, MD & Dr. Bonnetta Barry ref. provider found

## 2012-10-05 NOTE — H&P (Signed)
Jeremy [name] is an 77 y.o. male.   Chief Complaint: Patient is here for colonoscopy. HPI: Patient is 77 year old Caucasian male who is in for screening colonoscopy. He denies abdominal pain change in his bowel habits or rectal bleeding. This is patient's first exam. Family history is negative for colorectal carcinoma.  Past Medical History  Diagnosis Date  . Hypertension   . Hyperlipidemia   . CAD (coronary artery disease) 2005    anterior MI with stent to the LAD in 1991 / stent 1996 / nuclear 2005, no ischemia  . Abnormal CXR 4/20011    Chest  CT mode/serere COPD ( no mediastinal abnormality)  . Ejection fraction     55-60%, echo, April, 2011, could not estimate RV pressure  . Tobacco abuse   . COPD (chronic obstructive pulmonary disease)   . Shortness of breath   . Carotid bruit     Doppler, June, 2013, 0-39% bilateral  . History of cataract surgery     right eye 3 years ago  . Hx of appendectomy     60 years ago    Past Surgical History  Procedure Date  . Cholecystectomy     8 years ago [facility]  . Cyst removal hand     right wrist    History reviewed. No pertinent family history. Social History:  reports that he quit smoking about 17 years ago. His smoking use included Cigarettes. He has a 36 pack-year smoking history. His smokeless tobacco use includes Chew. His alcohol and drug histories not on file.  Allergies: No Known Allergies  Medications Prior to Admission  Medication Sig Dispense Refill  . aspirin 81 MG tablet Take 81 mg by mouth daily.        . metoprolol (LOPRESSOR) 50 MG tablet Take 25 mg by mouth at bedtime.      . simvastatin (ZOCOR) 40 MG tablet Take 1 tablet (40 mg total) by mouth every evening.  90 tablet  3  . nitroGLYCERIN (NITROSTAT) 0.4 MG SL tablet Place 1 tablet (0.4 mg total) under the tongue every 5 (five) minutes as needed.  25 tablet  3    No results found for this or any previous visit (from the past 48 hour(s)). No results  found.  ROS  Blood pressure 154/95, pulse 63, temperature 98.3 F (36.8 C), temperature source Oral, resp. rate 20, height 6' (1.829 m), weight 203 lb (92.08 kg), SpO2 96.00%. Physical Exam  Constitutional: He appears well-developed and well-nourished.  HENT:  Mouth/Throat: Oropharynx is clear and moist.  Eyes: Conjunctivae normal are normal. No scleral icterus.  Neck: No thyromegaly present.  Cardiovascular: Normal rate, regular rhythm and normal heart sounds.   Respiratory: Effort normal and breath sounds normal.  GI: Soft. He exhibits no distension and no mass. There is no tenderness.  Musculoskeletal: He exhibits no edema.  Lymphadenopathy:    He has no cervical adenopathy.  Neurological: He is alert.  Skin: Skin is warm and dry.     Assessment/Plan Average risk screening colonoscopy.   REHMAN,NAJEEB U 10/05/2012, 9:27 AM

## 2012-10-07 ENCOUNTER — Encounter (HOSPITAL_COMMUNITY): Payer: Self-pay | Admitting: Internal Medicine

## 2012-10-10 ENCOUNTER — Encounter (INDEPENDENT_AMBULATORY_CARE_PROVIDER_SITE_OTHER): Payer: Self-pay | Admitting: *Deleted

## 2013-01-04 ENCOUNTER — Other Ambulatory Visit: Payer: Self-pay

## 2013-01-10 ENCOUNTER — Other Ambulatory Visit: Payer: Self-pay

## 2013-01-10 MED ORDER — METOPROLOL TARTRATE 50 MG PO TABS: 25.0000 mg | ORAL_TABLET | Freq: Every day | ORAL | Status: DC

## 2013-01-19 ENCOUNTER — Other Ambulatory Visit: Payer: Self-pay | Admitting: *Deleted

## 2013-01-19 MED ORDER — METOPROLOL TARTRATE 50 MG PO TABS: 25.0000 mg | ORAL_TABLET | Freq: Every day | ORAL | Status: DC

## 2013-02-28 ENCOUNTER — Ambulatory Visit (INDEPENDENT_AMBULATORY_CARE_PROVIDER_SITE_OTHER): Payer: Medicare Other | Admitting: Cardiology

## 2013-02-28 ENCOUNTER — Encounter: Payer: Self-pay | Admitting: Cardiology

## 2013-02-28 VITALS — BP 114/69 | HR 58 | Ht 72.0 in | Wt 194.4 lb

## 2013-02-28 DIAGNOSIS — I2510 Atherosclerotic heart disease of native coronary artery without angina pectoris: Secondary | ICD-10-CM

## 2013-02-28 DIAGNOSIS — F17200 Nicotine dependence, unspecified, uncomplicated: Secondary | ICD-10-CM

## 2013-02-28 DIAGNOSIS — Z720 Tobacco use: Secondary | ICD-10-CM

## 2013-02-28 DIAGNOSIS — E785 Hyperlipidemia, unspecified: Secondary | ICD-10-CM

## 2013-02-28 DIAGNOSIS — Z1329 Encounter for screening for other suspected endocrine disorder: Secondary | ICD-10-CM

## 2013-02-28 DIAGNOSIS — R0989 Other specified symptoms and signs involving the circulatory and respiratory systems: Secondary | ICD-10-CM

## 2013-02-28 DIAGNOSIS — I10 Essential (primary) hypertension: Secondary | ICD-10-CM

## 2013-02-28 DIAGNOSIS — Z79899 Other long term (current) drug therapy: Secondary | ICD-10-CM

## 2013-02-28 MED ORDER — METOPROLOL TARTRATE 50 MG PO TABS: 25.0000 mg | ORAL_TABLET | Freq: Every day | ORAL | Status: DC

## 2013-02-28 MED ORDER — SIMVASTATIN 40 MG PO TABS: 40.0000 mg | ORAL_TABLET | Freq: Every evening | ORAL | Status: DC

## 2013-02-28 MED ORDER — NITROGLYCERIN 0.4 MG SL SUBL: 0.4000 mg | SUBLINGUAL_TABLET | SUBLINGUAL | Status: DC | PRN

## 2013-02-28 NOTE — Assessment & Plan Note (Signed)
He has mild carotid disease. This will be followed.

## 2013-02-28 NOTE — Assessment & Plan Note (Signed)
Fasting lipid profile will be obtained.

## 2013-02-28 NOTE — Assessment & Plan Note (Signed)
Coronary disease is stable. Last nuclear scan 2005. He does not have any of his symptoms. Ejection fraction was normal in 2011. I will plan to not do any tests this year. We will consider a stress test next year.

## 2013-02-28 NOTE — Patient Instructions (Addendum)
Your physician recommends that you schedule a follow-up appointment in: 1 year. You will receive a reminder letter in the mail in about 10 months reminding you to call and schedule your appointment. If you don't receive this letter, please contact our office. Your physician recommends that you continue on your current medications as directed. Please refer to the Current Medication list given to you today. Your physician recommends that you return for a FASTING lipid/liver, &CBC profile: Please have this done at [facility]. Do not eat or drink after midnight when you have this done. Please stop chewing tobacco.

## 2013-02-28 NOTE — Assessment & Plan Note (Signed)
Blood pressure is controlled. No change in therapy. 

## 2013-02-28 NOTE — Progress Notes (Signed)
HPI  Patient is seen in followup coronary disease. He has been very stable over many years. He is still using a small amount of chewing tobacco. He had a stent to his LAD in 1991. Originally he had chest pain and significant chest tightness as his symptoms. He has not had any chest tightness. He works very actively. His last exercise test was 2005 with no ischemia. An echo in April, 2011 revealed an ejection fraction of 55%. We did arrange for carotid Doppler last year. In June, 2014 he had mild disease.  No Known Allergies  Current Outpatient Prescriptions  Medication Sig Dispense Refill  . aspirin 81 MG tablet Take 81 mg by mouth daily.      . metoprolol (LOPRESSOR) 50 MG tablet Take 0.5 tablets (25 mg total) by mouth at bedtime.  15 tablet  2  . nitroGLYCERIN (NITROSTAT) 0.4 MG SL tablet Place 1 tablet (0.4 mg total) under the tongue every 5 (five) minutes as needed.  25 tablet  3  . simvastatin (ZOCOR) 40 MG tablet Take 40 mg by mouth every evening.       No current facility-administered medications for this visit.    History   Social History  . Marital Status: Married    Spouse Name: N/A    Number of Children: N/A  . Years of Education: N/A   Occupational History  . Not on file.   Social History Main Topics  . Smoking status: Former Smoker -- 3.00 packs/day for 12 years    Types: Cigarettes    Quit date: 09/15/1995  . Smokeless tobacco: Current User    Types: Chew     Comment: chews one-half pack tobacco per day  . Alcohol Use: Not on file  . Drug Use: Not on file  . Sexually Active: Not on file   Other Topics Concern  . Not on file   Social History Narrative  . No narrative on file    No family history on file.  Past Medical History  Diagnosis Date  . Hypertension   . Hyperlipidemia   . CAD (coronary artery disease) 2005    anterior MI with stent to the LAD in 1991 / stent 1996 / nuclear 2005, no ischemia  . Abnormal CXR 4/20011    Chest  CT mode/serere  COPD ( no mediastinal abnormality)  . Ejection fraction     55-60%, echo, April, 2011, could not estimate RV pressure  . Tobacco abuse   . COPD (chronic obstructive pulmonary disease)   . Shortness of breath   . Carotid bruit     Doppler, June, 2013, 0-39% bilateral  . History of cataract surgery     right eye 3 years ago  . Hx of appendectomy     60 years ago    Past Surgical History  Procedure Laterality Date  . Cholecystectomy      8 years ago [facility]  . Cyst removal hand      right wrist  . Colonoscopy  10/05/2012    Procedure: COLONOSCOPY;  Surgeon: Malissa Hippo, MD;  Location: [facility];  Service: Endoscopy;  Laterality: N/A;  1030    Patient Active Problem List   Diagnosis Date Noted  . Carotid bruit   . Hypertension   . Hyperlipidemia   . CAD (coronary artery disease)   . Abnormal CXR   . Ejection fraction   . Tobacco abuse   . COPD (chronic obstructive pulmonary disease)  ROS   Patient denies fever, chills, headache, sweats, rash, change in vision, change in hearing, chest pain, cough, nausea vomiting, urinary symptoms. All other systems are reviewed and are negative.  PHYSICAL EXAM  Patient is oriented to person time and place. Affect is normal. He's here with his wife. Lungs reveal decreased breath sounds. There is no respiratory distress. There is no jugulovenous distention. Cardiac exam reveals S1 and S2. There no clicks or significant murmurs. The abdomen is soft. There is no peripheral edema.  Filed Vitals:   02/28/13 1358  BP: 114/69  Pulse: 58  Height: 6' (1.829 m)  Weight: 194 lb 6.4 oz (88.179 kg)  SpO2: 98%   EKG is done today and reviewed by me. There is normal sinus rhythm. There is no significant change. EKG is normal.  ASSESSMENT & PLAN

## 2013-02-28 NOTE — Assessment & Plan Note (Signed)
He's using a small amount of chewing tobacco. I have once again urged him to stop completely.

## 2013-03-14 ENCOUNTER — Encounter (INDEPENDENT_AMBULATORY_CARE_PROVIDER_SITE_OTHER): Payer: Medicare Other

## 2013-03-14 DIAGNOSIS — I6529 Occlusion and stenosis of unspecified carotid artery: Secondary | ICD-10-CM

## 2013-03-28 ENCOUNTER — Telehealth: Payer: Self-pay | Admitting: *Deleted

## 2013-03-28 NOTE — Telephone Encounter (Signed)
Message copied by Eustace Moore on Tue Mar 28, 2013  9:32 AM ------      Message from: Eustace Moore      Created: Fri Mar 24, 2013  1:45 PM                   ----- Message -----         From: Prescott Parma, PA-C         Sent: 03/24/2013  11:02 AM           To: Lesle Chris, LPN            Mild ICA disease. F/u 1 year ------

## 2013-03-28 NOTE — Telephone Encounter (Signed)
Patient informed. 

## 2013-04-04 ENCOUNTER — Telehealth: Payer: Self-pay | Admitting: *Deleted

## 2013-04-04 NOTE — Telephone Encounter (Signed)
Message copied by Eustace Moore on Tue Apr 04, 2013  2:53 PM ------      Message from: [place], [place] D      Created: Tue Apr 04, 2013  1:51 PM       Please let him know that his labs including his cholesterol are good. Plan to continue the same medications. ------

## 2013-04-04 NOTE — Telephone Encounter (Signed)
Patient informed. 

## 2013-09-14 HISTORY — PX: SKIN CANCER EXCISION: SHX779

## 2013-10-22 ENCOUNTER — Encounter (HOSPITAL_COMMUNITY): Payer: Self-pay | Admitting: Emergency Medicine

## 2013-10-22 ENCOUNTER — Emergency Department (HOSPITAL_COMMUNITY)
Admission: EM | Admit: 2013-10-22 | Discharge: 2013-10-22 | Disposition: A | Discharge status: Home / Self Care | Payer: Medicare Other | Attending: Emergency Medicine | Admitting: Emergency Medicine

## 2013-10-22 ENCOUNTER — Emergency Department (HOSPITAL_COMMUNITY): Payer: Medicare Other

## 2013-10-22 DIAGNOSIS — J449 Chronic obstructive pulmonary disease, unspecified: Secondary | ICD-10-CM | POA: Insufficient documentation

## 2013-10-22 DIAGNOSIS — Z87891 Personal history of nicotine dependence: Secondary | ICD-10-CM | POA: Insufficient documentation

## 2013-10-22 DIAGNOSIS — Z9889 Other specified postprocedural states: Secondary | ICD-10-CM | POA: Insufficient documentation

## 2013-10-22 DIAGNOSIS — I2510 Atherosclerotic heart disease of native coronary artery without angina pectoris: Secondary | ICD-10-CM | POA: Insufficient documentation

## 2013-10-22 DIAGNOSIS — I10 Essential (primary) hypertension: Secondary | ICD-10-CM | POA: Insufficient documentation

## 2013-10-22 DIAGNOSIS — J4489 Other specified chronic obstructive pulmonary disease: Secondary | ICD-10-CM | POA: Insufficient documentation

## 2013-10-22 DIAGNOSIS — Z9849 Cataract extraction status, unspecified eye: Secondary | ICD-10-CM | POA: Insufficient documentation

## 2013-10-22 DIAGNOSIS — N200 Calculus of kidney: Secondary | ICD-10-CM

## 2013-10-22 DIAGNOSIS — Z9861 Coronary angioplasty status: Secondary | ICD-10-CM | POA: Insufficient documentation

## 2013-10-22 DIAGNOSIS — Z7982 Long term (current) use of aspirin: Secondary | ICD-10-CM | POA: Insufficient documentation

## 2013-10-22 DIAGNOSIS — E785 Hyperlipidemia, unspecified: Secondary | ICD-10-CM | POA: Insufficient documentation

## 2013-10-22 LAB — CBC WITH DIFFERENTIAL/PLATELET
BASOS ABS: 0 10*3/uL (ref 0.0–0.1)
Basophils Relative: 0 % (ref 0–1)
Eosinophils Absolute: 0.2 10*3/uL (ref 0.0–0.7)
Eosinophils Relative: 4 % (ref 0–5)
HCT: 45.9 % (ref 39.0–52.0)
HEMOGLOBIN: 15.8 g/dL (ref 13.0–17.0)
Lymphocytes Relative: 29 % (ref 12–46)
Lymphs Abs: 1.8 10*3/uL (ref 0.7–4.0)
MCH: 29.8 pg (ref 26.0–34.0)
MCHC: 34.4 g/dL (ref 30.0–36.0)
MCV: 86.4 fL (ref 78.0–100.0)
Monocytes Absolute: 0.4 10*3/uL (ref 0.1–1.0)
Monocytes Relative: 7 % (ref 3–12)
NEUTROS ABS: 3.7 10*3/uL (ref 1.7–7.7)
NEUTROS PCT: 60 % (ref 43–77)
PLATELETS: 193 10*3/uL (ref 150–400)
RBC: 5.31 MIL/uL (ref 4.22–5.81)
RDW: 13.4 % (ref 11.5–15.5)
WBC: 6.2 10*3/uL (ref 4.0–10.5)

## 2013-10-22 LAB — BASIC METABOLIC PANEL
BUN: 12 mg/dL (ref 6–23)
CHLORIDE: 103 meq/L (ref 96–112)
CO2: 26 mEq/L (ref 19–32)
Calcium: 9.1 mg/dL (ref 8.4–10.5)
Creatinine, Ser: 1.19 mg/dL (ref 0.50–1.35)
GFR calc non Af Amer: 58 mL/min — ABNORMAL LOW (ref >90)
GFR, EST AFRICAN AMERICAN: 67 mL/min — AB (ref >90)
Glucose, Bld: 135 mg/dL — ABNORMAL HIGH (ref 70–99)
Potassium: 4.2 mEq/L (ref 3.7–5.3)
SODIUM: 140 meq/L (ref 137–147)

## 2013-10-22 LAB — URINALYSIS, ROUTINE W REFLEX MICROSCOPIC
Glucose, UA: NEGATIVE mg/dL
KETONES UR: NEGATIVE mg/dL
LEUKOCYTES UA: NEGATIVE
NITRITE: NEGATIVE
Specific Gravity, Urine: >1.03 — ABNORMAL HIGH (ref 1.005–1.030)
Urobilinogen, UA: 0.2 mg/dL (ref 0.0–1.0)
pH: 5 (ref 5.0–8.0)

## 2013-10-22 LAB — URINE MICROSCOPIC-ADD ON

## 2013-10-22 MED ORDER — KETOROLAC TROMETHAMINE 30 MG/ML IJ SOLN
30.0000 mg | Freq: Once | INTRAMUSCULAR | Status: AC
  Administered 2013-10-22: 30 mg via INTRAVENOUS
  Filled 2013-10-22: qty 1

## 2013-10-22 MED ORDER — TAMSULOSIN HCL 0.4 MG PO CAPS: 0.4000 mg | ORAL_CAPSULE | Freq: Every day | ORAL | Status: DC

## 2013-10-22 MED ORDER — SODIUM CHLORIDE 0.9 % IV SOLN
Freq: Once | INTRAVENOUS | Status: AC
  Administered 2013-10-22: 09:00:00 via INTRAVENOUS

## 2013-10-22 MED ORDER — HYDROCODONE-ACETAMINOPHEN 5-325 MG PO TABS: ORAL_TABLET | ORAL | Status: DC

## 2013-10-22 MED ORDER — MORPHINE SULFATE 4 MG/ML IJ SOLN
4.0000 mg | Freq: Once | INTRAMUSCULAR | Status: AC
  Administered 2013-10-22: 4 mg via INTRAVENOUS
  Filled 2013-10-22: qty 1

## 2013-10-22 MED ORDER — ONDANSETRON HCL 4 MG PO TABS: 4.0000 mg | ORAL_TABLET | Freq: Four times a day (QID) | ORAL | Status: DC

## 2013-10-22 MED ORDER — ONDANSETRON HCL 4 MG/2ML IJ SOLN
4.0000 mg | Freq: Once | INTRAMUSCULAR | Status: AC
  Administered 2013-10-22: 4 mg via INTRAVENOUS
  Filled 2013-10-22: qty 2

## 2013-10-22 NOTE — ED Provider Notes (Signed)
  Face-to-face evaluation   History: Right flank pain and vomiting started. This morning. Pain is typical of prior kidney stone pain. Recent kidney stones were passed. He does not have a urologist  Physical exam: Elderly, alert, calm, pain free after treatment with morphine, and Toradol. After with mild, midepigastric tenderness. No rebound tenderness. No palpable mass  Medical screening examination/treatment/procedure(s) were conducted as a shared visit with non-physician practitioner(s) and myself.  I personally evaluated the patient during the encounter  Richarda Blade, MD 10/22/13 1944

## 2013-10-22 NOTE — ED Notes (Signed)
Patient with no complaints at this time. Respirations even and unlabored. Skin warm/dry. Discharge instructions reviewed with patient at this time. Patient given opportunity to voice concerns/ask questions. IV removed per policy and band-aid applied to site. Patient discharged at this time and left Emergency Department with steady gait.  

## 2013-10-22 NOTE — ED Notes (Signed)
Patient with c/o right flank pain and vomiting that started this morning at 0700. Reports urinary frequency, but denies pain with urination.

## 2013-10-22 NOTE — ED Provider Notes (Signed)
CSN: [id number]     Arrival date & time 10/22/13  0808 History   First MD Initiated Contact with Patient 10/22/13 [phone]     Chief Complaint  Patient presents with  . Flank Pain   (Consider location/radiation/quality/duration/timing/severity/associated sxs/prior Treatment) Patient is a 78 y.o. male presenting with flank pain. The history is provided by the patient.  Flank Pain This is a new problem. The current episode started today. The problem occurs constantly. The problem has been unchanged. Associated symptoms include nausea, urinary symptoms and vomiting. Pertinent negatives include no abdominal pain, chest pain, chills, diaphoresis, fever, headaches, neck pain, numbness, rash, vertigo or weakness. Exacerbated by: urination. He has tried nothing for the symptoms. The treatment provided no relief.   Patient with hx of recurrent kidney stones, c/o sudden onset of right flank pain that began at 6:30 am this morning.  States he vomited x 2 and has noticed dark colored urine for 2 days.  He states the pain feels similar to previous kidney stones.  He states that he has always been able to pass the stones and has not been evaluated by a urologist before.  He denies chest pain, abdominal pain, upper back pain, dyspnea, diaphoresis, fever, or chills.   Past Medical History  Diagnosis Date  . Hypertension   . Hyperlipidemia   . CAD (coronary artery disease) 2005    anterior MI with stent to the LAD in 1991 / stent 1996 / nuclear 2005, no ischemia  . Abnormal CXR 4/20011    Chest  CT mode/serere COPD ( no mediastinal abnormality)  . Ejection fraction     55-60%, echo, April, 2011, could not estimate RV pressure  . Tobacco abuse   . COPD (chronic obstructive pulmonary disease)   . Shortness of breath   . Carotid bruit     Doppler, June, 2013, 0-39% bilateral  . History of cataract surgery     right eye 3 years ago  . Hx of appendectomy     60 years ago   Past Surgical History  Procedure  Laterality Date  . Cholecystectomy      8 years ago [facility]  . Cyst removal hand      right wrist  . Colonoscopy  10/05/2012    Procedure: COLONOSCOPY;  Surgeon: Rogene Houston, MD;  Location: [facility];  Service: Endoscopy;  Laterality: N/A;  1030  . Appendectomy     No family history on file. History  Substance Use Topics  . Smoking status: Former Smoker -- 3.00 packs/day for 12 years    Types: Cigarettes    Quit date: 09/15/1995  . Smokeless tobacco: Current User    Types: Chew     Comment: chews one-half pack tobacco per day  . Alcohol Use: No    Review of Systems  Constitutional: Negative for fever, chills, diaphoresis and appetite change.  Respiratory: Negative for chest tightness and shortness of breath.   Cardiovascular: Negative for chest pain.  Gastrointestinal: Positive for nausea and vomiting. Negative for abdominal pain and blood in stool.  Genitourinary: Positive for dysuria and flank pain. Negative for hematuria, decreased urine volume, discharge, scrotal swelling, difficulty urinating, penile pain and testicular pain.  Musculoskeletal: Negative for back pain and neck pain.  Skin: Negative for color change and rash.  Neurological: Negative for dizziness, vertigo, syncope, speech difficulty, weakness, numbness and headaches.  Hematological: Negative for adenopathy.  All other systems reviewed and are negative.    Allergies  Review of patient's  allergies indicates no known allergies.  Home Medications   Current Outpatient Rx  Name  Route  Sig  Dispense  Refill  . aspirin 81 MG tablet   Oral   Take 81 mg by mouth daily.         . metoprolol (LOPRESSOR) 50 MG tablet   Oral   Take 0.5 tablets (25 mg total) by mouth at bedtime.   15 tablet   6   . nitroGLYCERIN (NITROSTAT) 0.4 MG SL tablet   Sublingual   Place 1 tablet (0.4 mg total) under the tongue every 5 (five) minutes as needed.   25 tablet   3   . simvastatin (ZOCOR) 40 MG tablet   Oral    Take 1 tablet (40 mg total) by mouth every evening.   30 tablet   6    BP 144/64  Pulse 52  Temp(Src) 98.1 F (36.7 C) (Oral)  Resp 18  Ht 6' (1.829 m)  Wt 203 lb (92.08 kg)  BMI 27.53 kg/m2  SpO2 97% Physical Exam  Nursing note and vitals reviewed. Constitutional: He is oriented to person, place, and time. He appears well-developed and well-nourished. No distress.  HENT:  Head: Normocephalic and atraumatic.  Mouth/Throat: Oropharynx is clear and moist.  Neck: Normal range of motion. Neck supple.  Cardiovascular: Normal rate, regular rhythm, normal heart sounds and intact distal pulses.   No murmur heard. Pulmonary/Chest: Effort normal and breath sounds normal. No respiratory distress.  Abdominal: Soft. Normal appearance and bowel sounds are normal. He exhibits no distension and no mass. There is no hepatosplenomegaly. There is no tenderness. There is no rigidity, no rebound, no guarding, no CVA tenderness and no tenderness at McBurney's point.  Patient points to right flank as area of tenderness.  No CVA tenderness on exam  Musculoskeletal: Normal range of motion. He exhibits no edema.  Neurological: He is alert and oriented to person, place, and time. He exhibits normal muscle tone. Coordination normal.  Skin: Skin is warm and dry.    ED Course  Procedures (including critical care time) Labs Review Labs Reviewed  URINALYSIS, ROUTINE W REFLEX MICROSCOPIC - Abnormal; Notable for the following:    APPearance CLOUDY (*)    Specific Gravity, Urine >1.030 (*)    Hgb urine dipstick LARGE (*)    Bilirubin Urine SMALL (*)    Protein, ur TRACE (*)    All other components within normal limits  BASIC METABOLIC PANEL - Abnormal; Notable for the following:    Glucose, Bld 135 (*)    GFR calc non Af Amer 58 (*)    GFR calc Af Amer 67 (*)    All other components within normal limits  CBC WITH DIFFERENTIAL  URINE MICROSCOPIC-ADD ON   Imaging Review Ct Abdomen Pelvis Wo  Contrast  10/22/2013   CLINICAL DATA:  Right-sided flank pain  EXAM: CT ABDOMEN AND PELVIS WITHOUT CONTRAST  TECHNIQUE: Multidetector CT imaging of the abdomen and pelvis was performed following the standard protocol without intravenous contrast.  COMPARISON:  06/12/2011  FINDINGS: Mild emphysematous changes are noted in the lung bases. No focal infiltrate is seen.  The gallbladder is been surgically removed. The liver, spleen, adrenal glands and pancreas are within normal limits. The left kidney shows a tiny nonobstructing stone in the lower pole. The collecting system and left ureter are within normal limits. On the right, there is mild hydronephrosis secondary to a tiny right mid ureteral stone best seen on image number 44  of series 2. A tiny nonobstructing stone is noted in the lower pole of the right kidney as well. Mild diverticular change is noted without diverticulitis. The appendix is not well visualized. This is consistent with patient's surgical history. The bladder is decompressed. The bony structures show degenerative change of the lumbar spine. No acute abnormality is noted.  IMPRESSION: Tiny bilateral nonobstructing renal stones.  Right mid ureteral stone measuring 1-2 mm with mild hydronephrotic changes.  Diverticular change without diverticulitis.   Electronically Signed   By: Inez Catalina M.D.   On: 10/22/2013 09:33    EKG Interpretation   None       MDM   Patient with sudden onset of right flank pain, typical of previous kidney stones.  Appears uncomfortable, VSS.  Will check labs , CT abd/pelvis and order anti-emetic and pain medication  1000  Patient is resting comfortably, pain free at this time.  Discussed CT findings and lab results with patient and spouse.  He agrees to strain all urine, zofran and percocet for pain, and close f/u with urology.  He appears stable for discharge.     Ishi Danser L. Fotios Amos, PA-C 10/22/13 1015

## 2013-10-22 NOTE — Discharge Instructions (Signed)
Kidney Stones Kidney stones (urolithiasis) are deposits that form inside your kidneys. The intense pain is caused by the stone moving through the urinary tract. When the stone moves, the ureter goes into spasm around the stone. The stone is usually passed in the urine.  CAUSES   A disorder that makes certain neck glands produce too much parathyroid hormone (primary hyperparathyroidism).  A buildup of uric acid crystals, similar to gout in your joints.  Narrowing (stricture) of the ureter.  A kidney obstruction present at birth (congenital obstruction).  Previous surgery on the kidney or ureters.  Numerous kidney infections. SYMPTOMS   Feeling sick to your stomach (nauseous).  Throwing up (vomiting).  Blood in the urine (hematuria).  Pain that usually spreads (radiates) to the groin.  Frequency or urgency of urination. DIAGNOSIS   Taking a history and physical exam.  Blood or urine tests.  CT scan.  Occasionally, an examination of the inside of the urinary bladder (cystoscopy) is performed. TREATMENT   Observation.  Increasing your fluid intake.  Extracorporeal shock wave lithotripsy This is a noninvasive procedure that uses shock waves to break up kidney stones.  Surgery may be needed if you have severe pain or persistent obstruction. There are various surgical procedures. Most of the procedures are performed with the use of small instruments. Only small incisions are needed to accommodate these instruments, so recovery time is minimized. The size, location, and chemical composition are all important variables that will determine the proper choice of action for you. Talk to your health care provider to better understand your situation so that you will minimize the risk of injury to yourself and your kidney.  HOME CARE INSTRUCTIONS   Drink enough water and fluids to keep your urine clear or pale yellow. This will help you to pass the stone or stone fragments.  Strain  all urine through the provided strainer. Keep all particulate matter and stones for your health care provider to see. The stone causing the pain may be as small as a grain of salt. It is very important to use the strainer each and every time you pass your urine. The collection of your stone will allow your health care provider to analyze it and verify that a stone has actually passed. The stone analysis will often identify what you can do to reduce the incidence of recurrences.  Only take over-the-counter or prescription medicines for pain, discomfort, or fever as directed by your health care provider.  Make a follow-up appointment with your health care provider as directed.  Get follow-up X-rays if required. The absence of pain does not always mean that the stone has passed. It may have only stopped moving. If the urine remains completely obstructed, it can cause loss of kidney function or even complete destruction of the kidney. It is your responsibility to make sure X-rays and follow-ups are completed. Ultrasounds of the kidney can show blockages and the status of the kidney. Ultrasounds are not associated with any radiation and can be performed easily in a matter of minutes. SEEK MEDICAL CARE IF:  You experience pain that is progressive and unresponsive to any pain medicine you have been prescribed. SEEK IMMEDIATE MEDICAL CARE IF:   Pain cannot be controlled with the prescribed medicine.  You have a fever or shaking chills.  The severity or intensity of pain increases over 18 hours and is not relieved by pain medicine.  You develop a new onset of abdominal pain.  You feel faint or pass  out.  You are unable to urinate. MAKE SURE YOU:   Understand these instructions.  Will watch your condition.  Will get help right away if you are not doing well or get worse. Document Released: 08/31/2005 Document Revised: 05/03/2013 Document Reviewed: 02/01/2013 [facility] Patient Information 2014  [place].  Diet for Kidney Stones Kidney stones are small, hard masses that form inside your kidneys. They are made up of salts and minerals and often form when high levels build up in the urine. The minerals can then start to build up, crystalize, and stick together to form stones. There are several different types of kidney stones. The following types of stones may be influenced by dietary factors:   Calcium Oxalate Stones. An oxalate is a salt found in certain foods. Within the body, calcium can combine with oxalates to form calcium oxalate stones, which can be excreted in the urine in high amounts. This is the most common type of kidney stone.  Calcium Phosphate Stones. These stones may occur when the pH of the urine becomes too high, or less acidic, from too much calcium being excreted in the urine. The pH is a measure of how acidic or basic a substance is.  Uric Acid Stones. This type of stone occurs when the pH of the urine becomes too low, or very acidic, because substances called purines build up in the urine. Purines are found in animal proteins. When the urine is highly concentrated with acid, uric acid kidney stones can form.  Other risk factors for kidney stones include genetics, environment, and being overweight. Your caregiver may ask you to follow specific diet guidelines based on the type of stone you have to lessen the chances of your body making more kidney stones.  GENERAL GUIDELINES FOR ALL TYPES OF STONES  Drink plenty of fluid. Drink 12 16 cups of fluid a day, drinking mainly water.This is the most important thing you can do to prevent the formation of future kidney stones.  Maintain a healthy weight. Your caregiver or dietitian can help you determine what a healthy weight is for you. If you are overweight, weight loss may help prevent the formation of future kidney stones.  Eat a diet adequate in animal protein. Too much animal protein can contribute to the formation  of stones. Your dietitian can help you determine how much protein you should be eating. Avoid low carbohydrate, high protein diets.  Follow a balanced eating approach. The DASH diet, which stands for "Dietary Approaches to Stop Hypertension," is an effective meal plan for reducing stone formation. This diet is high in fruits, vegetables, dairy, and whole grains and low in animal protein. Ask your caregiver or dietitian for information about the DASH diet. ADDITIONAL DIET GUIDELINES FOR CALCIUM STONES Avoid foods high in salt. This includes table salt, salt seasonings, MSG, soy sauce, cured and processed meats, salted crackers and snack foods, fast food, and canned soups and foods. Ask your caregiver or dietitian for information about reducing sodium in your diet or following the low sodium diet.  Ensure adequate calcium intake. Use the following table for calcium guidelines:  Men 81 years old and younger  1000 mg/day.  Men 46 years old and older  1500 mg/day.  Women 51 76 years old  1000 mg/day.  Women 50 years and older  1500 mg/day. Your dietitian can help you determine if you are getting enough calcium in your diet. Foods that are high in calcium include dairy products, broccoli, cheese, yogurt, and  pudding. If you need to take a calcium supplement, take it only in the form of calcium citrate.  °Avoid foods high in oxalate. Be sure that any supplements you take do not contain more than 500 mg of vitamin C. Vitamin C is converted into oxalate in the body. You do not need to avoid fruits and vegetables high in vitamin C.  °· Grains: High-fiber or bran cereal, whole-wheat bread, grits, barley, buckwheat, amaranth, pretzels, and fruitcake. °· Vegetables: Dried beans, wax beans, dark leafy greens, eggplant, leeks, okra, parsley, rutabaga, tomato paste, watercress, zucchini, and escarole. °· Fruit: Dried apricots, red currants, figs, kiwi, and rhubarb. °· Meat and Meat Substitutes: Soybeans and foods made  from soy (soyburger, miso), dried beans, peanut butter. °· Milk: Chocolate milk mixes and soymilk. °· Fats and Oils: Nuts (peanuts, almonds, pecans, cashews, hazelnuts) and nut butters, sesame seeds, and tDahini paste. °· Condiments/Miscellaneous: Chocolate, carob, marmalade, poppy seeds, instant iced tea, and juice from high-oxalate fruits.    °Document Released: 12/26/2010 Document Revised: 03/01/2012 Document Reviewed: 02/15/2012 °[employer]® Patient Information ©2014 [employer]. ° °

## 2013-10-31 ENCOUNTER — Encounter (HOSPITAL_COMMUNITY): Payer: Self-pay | Admitting: Emergency Medicine

## 2013-10-31 ENCOUNTER — Emergency Department (HOSPITAL_COMMUNITY)
Admission: EM | Admit: 2013-10-31 | Discharge: 2013-10-31 | Disposition: A | Discharge status: Home / Self Care | Payer: Medicare Other | Attending: Emergency Medicine | Admitting: Emergency Medicine

## 2013-10-31 DIAGNOSIS — IMO0002 Reserved for concepts with insufficient information to code with codable children: Secondary | ICD-10-CM | POA: Insufficient documentation

## 2013-10-31 DIAGNOSIS — Z23 Encounter for immunization: Secondary | ICD-10-CM | POA: Insufficient documentation

## 2013-10-31 DIAGNOSIS — Y9389 Activity, other specified: Secondary | ICD-10-CM | POA: Insufficient documentation

## 2013-10-31 DIAGNOSIS — I10 Essential (primary) hypertension: Secondary | ICD-10-CM | POA: Insufficient documentation

## 2013-10-31 DIAGNOSIS — J4489 Other specified chronic obstructive pulmonary disease: Secondary | ICD-10-CM | POA: Insufficient documentation

## 2013-10-31 DIAGNOSIS — S0502XA Injury of conjunctiva and corneal abrasion without foreign body, left eye, initial encounter: Secondary | ICD-10-CM

## 2013-10-31 DIAGNOSIS — Z9089 Acquired absence of other organs: Secondary | ICD-10-CM | POA: Insufficient documentation

## 2013-10-31 DIAGNOSIS — Z87442 Personal history of urinary calculi: Secondary | ICD-10-CM | POA: Insufficient documentation

## 2013-10-31 DIAGNOSIS — Z8669 Personal history of other diseases of the nervous system and sense organs: Secondary | ICD-10-CM | POA: Insufficient documentation

## 2013-10-31 DIAGNOSIS — J449 Chronic obstructive pulmonary disease, unspecified: Secondary | ICD-10-CM | POA: Insufficient documentation

## 2013-10-31 DIAGNOSIS — E785 Hyperlipidemia, unspecified: Secondary | ICD-10-CM | POA: Insufficient documentation

## 2013-10-31 DIAGNOSIS — I2510 Atherosclerotic heart disease of native coronary artery without angina pectoris: Secondary | ICD-10-CM | POA: Insufficient documentation

## 2013-10-31 DIAGNOSIS — Z87891 Personal history of nicotine dependence: Secondary | ICD-10-CM | POA: Insufficient documentation

## 2013-10-31 DIAGNOSIS — S058X9A Other injuries of unspecified eye and orbit, initial encounter: Secondary | ICD-10-CM | POA: Insufficient documentation

## 2013-10-31 DIAGNOSIS — Y929 Unspecified place or not applicable: Secondary | ICD-10-CM | POA: Insufficient documentation

## 2013-10-31 DIAGNOSIS — Z7982 Long term (current) use of aspirin: Secondary | ICD-10-CM | POA: Insufficient documentation

## 2013-10-31 MED ORDER — ERYTHROMYCIN 5 MG/GM OP OINT
TOPICAL_OINTMENT | Freq: Once | OPHTHALMIC | Status: AC
  Administered 2013-10-31: 17:00:00 via OPHTHALMIC
  Filled 2013-10-31: qty 3.5

## 2013-10-31 MED ORDER — TETANUS-DIPHTH-ACELL PERTUSSIS 5-2.5-18.5 LF-MCG/0.5 IM SUSP
INTRAMUSCULAR | Status: AC
  Administered 2013-10-31: 0.5 mL via INTRAMUSCULAR
  Filled 2013-10-31: qty 0.5

## 2013-10-31 MED ORDER — TETRACAINE HCL 0.5 % OP SOLN
OPHTHALMIC | Status: AC
  Administered 2013-10-31: 17:00:00
  Filled 2013-10-31: qty 2

## 2013-10-31 MED ORDER — TOBRAMYCIN 0.3 % OP OINT
TOPICAL_OINTMENT | Freq: Three times a day (TID) | OPHTHALMIC | Status: DC
Start: 2013-10-31 — End: 2013-10-31
  Filled 2013-10-31: qty 3.5

## 2013-10-31 NOTE — Discharge Instructions (Signed)
Corneal Abrasion The cornea is the clear covering at the front and center of the eye. When looking at the colored portion of the eye (iris), you are looking through the cornea. This very thin tissue is made up of many layers. The surface layer is a single layer of cells (corneal epithelium) and is one of the most sensitive tissues in the body. If a scratch or injury causes the corneal epithelium to come off, it is called a corneal abrasion. If the injury extends to the tissues below the epithelium, the condition is called a corneal ulcer. CAUSES   Scratches.  Trauma.  Foreign body in the eye. Some people have recurrences of abrasions in the area of the original injury even after it has healed (recurrent erosion syndrome). Recurrent erosion syndrome generally improves and goes away with time. SYMPTOMS   Eye pain.  Difficulty or inability to keep the injured eye open.  The eye becomes very sensitive to light.  Recurrent erosions tend to happen suddenly, first thing in the morning, usually after waking up and opening the eye. DIAGNOSIS  Your health care provider can diagnose a corneal abrasion during an eye exam. Dye is usually placed in the eye using a drop or a small paper strip moistened by your tears. When the eye is examined with a special light, the abrasion shows up clearly because of the dye. TREATMENT   Small abrasions may be treated with antibiotic drops or ointment alone.  Usually a pressure patch is specially applied. Pressure patches prevent the eye from blinking, allowing the corneal epithelium to heal. A pressure patch also reduces the amount of pain present in the eye during healing. Most corneal abrasions heal within 2 3 days with no effect on vision. If the abrasion becomes infected and spreads to the deeper tissues of the cornea, a corneal ulcer can result. This is serious because it can cause corneal scarring. Corneal scars interfere with light passing through the cornea  and cause a loss of vision in the involved eye. HOME CARE INSTRUCTIONS  Use medicine or ointment as directed. Only take over-the-counter or prescription medicines for pain, discomfort, or fever as directed by your health care provider.  Do not drive or operate machinery while your eye is patched. Your ability to judge distances is impaired.  If your health care provider has given you a follow-up appointment, it is very important to keep that appointment. Not keeping the appointment could result in a severe eye infection or permanent loss of vision. If there is any problem keeping the appointment, let your health care provider know. SEEK MEDICAL CARE IF:   You have pain, light sensitivity, and a scratchy feeling in one eye or both eyes.  Your pressure patch keeps loosening up, and you can blink your eye under the patch after treatment.  Any kind of discharge develops from the eye after treatment or if the lids stick together in the morning.  You have the same symptoms in the morning as you did with the original abrasion days, weeks, or months after the abrasion healed. MAKE SURE YOU:   Understand these instructions.  Will watch your condition.  Will get help right away if you are not doing well or get worse. Document Released: 08/28/2000 Document Revised: 06/21/2013 Document Reviewed: 05/08/2013 [facility] Patient Information 2014 [place].   Apply a small ribbon of the eye antibiotic to your left eye 3 times daily for the next 7 days.

## 2013-10-31 NOTE — ED Notes (Signed)
Per patient working on lawnmower when a piece of metal came off and hit him in the eye. Patient c/o pain and blurred vision. Patient has watery drainage.

## 2013-10-31 NOTE — ED Notes (Signed)
?   FB of lt eye for 2 hours, metallic.  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

## 2013-11-04 ENCOUNTER — Encounter (HOSPITAL_COMMUNITY): Payer: Self-pay | Admitting: Emergency Medicine

## 2013-11-04 NOTE — ED Provider Notes (Signed)
CSN: [id number]     Arrival date & time 10/31/13  1354 History   First MD Initiated Contact with Patient 10/31/13 1540     Chief Complaint  Patient presents with  . Foreign Body in [place]     (Consider location/radiation/quality/duration/timing/severity/associated sxs/prior Treatment) Patient is a 78 y.o. male presenting with foreign body in eye. The history is provided by the patient.  Foreign Body in Eye This is a new problem. The current episode started today. The problem occurs constantly. The problem has been unchanged. Associated symptoms include a visual change. Pertinent negatives include no fever, headaches, numbness, rash, sore throat or weakness. Associated symptoms comments: Watery drainage causing blurred vision.. Exacerbated by: blinking, bright lights. Treatments tried: flushing with water. The treatment provided no relief.    Past Medical History  Diagnosis Date  . Hypertension   . Hyperlipidemia   . CAD (coronary artery disease) 2005    anterior MI with stent to the LAD in 1991 / stent 1996 / nuclear 2005, no ischemia  . Abnormal CXR 4/20011    Chest  CT mode/serere COPD ( no mediastinal abnormality)  . Ejection fraction     55-60%, echo, April, 2011, could not estimate RV pressure  . Tobacco abuse   . COPD (chronic obstructive pulmonary disease)   . Shortness of breath   . Carotid bruit     Doppler, June, 2013, 0-39% bilateral  . History of cataract surgery     right eye 3 years ago  . Hx of appendectomy     60 years ago  . Kidney stone   . Cataract     right eye   Past Surgical History  Procedure Laterality Date  . Cholecystectomy      8 years ago [facility]  . Cyst removal hand      right wrist  . Colonoscopy  10/05/2012    Procedure: COLONOSCOPY;  Surgeon: Rogene Houston, MD;  Location: [facility]ITE;  Service: Endoscopy;  Laterality: N/A;  1030  . Appendectomy     No family history on file. History  Substance Use Topics  . Smoking status: Former Smoker  -- 3.00 packs/day for 12 years    Types: Cigarettes    Quit date: 09/15/1995  . Smokeless tobacco: Current User    Types: Chew     Comment: chews one-half pack tobacco per day  . Alcohol Use: No    Review of Systems  Constitutional: Negative for fever.  HENT: Positive for rhinorrhea. Negative for sore throat.   Eyes: Positive for photophobia, pain and visual disturbance.  Respiratory: Negative.   Cardiovascular: Negative.   Skin: Negative.  Negative for rash and wound.  Neurological: Negative for dizziness, weakness, light-headedness, numbness and headaches.  Psychiatric/Behavioral: Negative.       Allergies  Review of patient's allergies indicates no known allergies.  Home Medications   Current Outpatient Rx  Name  Route  Sig  Dispense  Refill  . aspirin 81 MG tablet   Oral   Take 81 mg by mouth daily.         . metoprolol (LOPRESSOR) 50 MG tablet   Oral   Take 0.5 tablets (25 mg total) by mouth at bedtime.   15 tablet   6   . nitroGLYCERIN (NITROSTAT) 0.4 MG SL tablet   Sublingual   Place 1 tablet (0.4 mg total) under the tongue every 5 (five) minutes as needed.   25 tablet   3   . simvastatin (  ZOCOR) 40 MG tablet   Oral   Take 1 tablet (40 mg total) by mouth every evening.   30 tablet   6    BP 135/70  Pulse 70  Temp(Src) 98.2 F (36.8 C) (Oral)  Resp 18  Ht 6' (1.829 m)  Wt 200 lb (90.719 kg)  BMI 27.12 kg/m2  SpO2 94% Physical Exam  Constitutional: He is oriented to person, place, and time. He appears well-developed and well-nourished.  HENT:  Head: Normocephalic and atraumatic.  Nose: Rhinorrhea present. No mucosal edema.  Mouth/Throat: Uvula is midline, oropharynx is clear and moist and mucous membranes are normal.  Eyes: EOM are normal. Pupils are equal, round, and reactive to light. Lids are everted and swept, no foreign bodies found. Right eye exhibits no chemosis. No foreign body present in the right eye. Left eye exhibits no chemosis.  No foreign body present in the left eye. Left conjunctiva is injected. Left conjunctiva has no hemorrhage.  Slit lamp exam:      The left eye shows corneal abrasion and fluorescein uptake. The left eye shows no foreign body and no hyphema.  Small round corneal abrasion 7 oclock position.  No remaining fb.  Visual Acuity - Bilateral Near: 20 ; Bilateral Distance: 20 ; R Near: 20 (Per patient has cataract) ; R Distance: 100 ; L Near: 20 ; L Distance: 20  Cardiovascular: Normal rate and normal heart sounds.   Pulmonary/Chest: Effort normal. No respiratory distress. He has no wheezes. He has no rales.  Abdominal: Soft. There is no tenderness.  Musculoskeletal: Normal range of motion.  Neurological: He is alert and oriented to person, place, and time.  Skin: Skin is warm and dry. No rash noted.  Psychiatric: He has a normal mood and affect.    ED Course  Procedures (including critical care time) Labs Review Labs Reviewed - No data to display Imaging Review No results found.  EKG Interpretation   None       MDM   Final diagnoses:  Corneal abrasion, left    Pt given erythromycin ointment and applied to left eye,  He will continue treating with this med at home. F/u with his ophthalmologist in 2 days for recheck of this injury.  Pt understands plan.  He obtained improvement in pain with application of the ointment.    Evalee Jefferson, PA-C 11/04/13 [phone]

## 2013-11-05 NOTE — ED Provider Notes (Signed)
Medical screening examination/treatment/procedure(s) were performed by non-physician practitioner and as supervising physician I was immediately available for consultation/collaboration.     Veryl Speak, MD 11/05/13 0157

## 2013-11-10 ENCOUNTER — Other Ambulatory Visit: Payer: Self-pay | Admitting: *Deleted

## 2013-11-10 MED ORDER — METOPROLOL TARTRATE 50 MG PO TABS: 25.0000 mg | ORAL_TABLET | Freq: Every day | ORAL | Status: DC

## 2013-11-10 MED ORDER — SIMVASTATIN 40 MG PO TABS: 40.0000 mg | ORAL_TABLET | Freq: Every evening | ORAL | Status: DC

## 2014-01-09 ENCOUNTER — Encounter (HOSPITAL_COMMUNITY): Payer: Self-pay | Admitting: Pharmacy Technician

## 2014-01-16 ENCOUNTER — Encounter (HOSPITAL_COMMUNITY): Payer: Self-pay

## 2014-01-16 ENCOUNTER — Encounter (HOSPITAL_COMMUNITY)
Admission: RE | Admit: 2014-01-16 | Discharge: 2014-01-16 | Disposition: A | Discharge status: Home / Self Care | Payer: Medicare Other | Source: Ambulatory Visit | Attending: Ophthalmology | Admitting: Ophthalmology

## 2014-01-16 DIAGNOSIS — Z01812 Encounter for preprocedural laboratory examination: Secondary | ICD-10-CM | POA: Insufficient documentation

## 2014-01-16 HISTORY — DX: Sleep apnea, unspecified: G47.30

## 2014-01-16 HISTORY — DX: Unspecified hearing loss, unspecified ear: H91.90

## 2014-01-16 LAB — HEMOGLOBIN AND HEMATOCRIT, BLOOD
HCT: 44.2 % (ref 39.0–52.0)
Hemoglobin: 15.1 g/dL (ref 13.0–17.0)

## 2014-01-16 LAB — BASIC METABOLIC PANEL
BUN: 12 mg/dL (ref 6–23)
CALCIUM: 9.1 mg/dL (ref 8.4–10.5)
CO2: 29 mEq/L (ref 19–32)
Chloride: 106 mEq/L (ref 96–112)
Creatinine, Ser: 1.19 mg/dL (ref 0.50–1.35)
GFR calc Af Amer: 67 mL/min — ABNORMAL LOW (ref >90)
GFR calc non Af Amer: 58 mL/min — ABNORMAL LOW (ref >90)
Glucose, Bld: 96 mg/dL (ref 70–99)
Potassium: 4.2 mEq/L (ref 3.7–5.3)
Sodium: 145 mEq/L (ref 137–147)

## 2014-01-16 NOTE — Progress Notes (Signed)
01/16/14 0756  OBSTRUCTIVE SLEEP APNEA  Have you ever been diagnosed with sleep apnea through a sleep study? No  Do you snore loudly (loud enough to be heard through closed doors)?  1  Do you often feel tired, fatigued, or sleepy during the daytime? 0  Has anyone observed you stop breathing during your sleep? 0  Do you have, or are you being treated for high blood pressure? 1  BMI more than 35 kg/m2? 0  Age over 76 years old? 1  Neck circumference greater than 40 cm/16 inches? 1  Gender: 1  Obstructive Sleep Apnea Score 5  Score 4 or greater  Results sent to PCP

## 2014-01-16 NOTE — Patient Instructions (Addendum)
Your procedure is scheduled on:  01/22/14  Report to [facility] at 06:15 AM.  Call this number if you have problems the morning of surgery: [phone]   Remember:   Do not eat food or drink liquids after midnight.   Take these medicines the morning of surgery with A SIP OF WATER: None   Do not wear jewelry, make-up or nail polish.  Do not wear lotions, powders, or perfumes. You may wear deodorant.  Do not bring valuables to the hospital.  [facility] is not responsible for any belongings or valuables.               Contacts, dentures or bridgework may not be worn into surgery.               Patients discharged the day of surgery will not be allowed to drive home.   Special Instructions: Start using your eye drops prior to surgery as directed by your eye doctor.   Please read over the following fact sheets that you were given: Anesthesia Post-op Instructions and Care and Recovery After Surgery     Cataract Surgery  A cataract is a clouding of the lens of the eye. When a lens becomes cloudy, vision is reduced based on the degree and nature of the clouding. Surgery may be needed to improve vision. Surgery removes the cloudy lens and usually replaces it with a substitute lens (intraocular lens, IOL). LET YOUR EYE DOCTOR KNOW ABOUT:  Allergies to food or medicine.  Medicines taken including herbs, eyedrops, over-the-counter medicines, and creams.  Use of steroids (by mouth or creams).  Previous problems with anesthetics or numbing medicine.  History of bleeding problems or blood clots.  Previous surgery.  Other health problems, including diabetes and kidney problems.  Possibility of pregnancy, if this applies. RISKS AND COMPLICATIONS  Infection.  Inflammation of the eyeball (endophthalmitis) that can spread to both eyes (sympathetic ophthalmia).  Poor wound healing.  If an IOL is inserted, it can later fall out of proper position. This is very uncommon.  Clouding of  the part of your eye that holds an IOL in place. This is called an "after-cataract." These are uncommon, but easily treated. BEFORE THE PROCEDURE  Do not eat or drink anything except small amounts of water for 8 to 12 before your surgery, or as directed by your caregiver.  Unless you are told otherwise, continue any eyedrops you have been prescribed.  Talk to your primary caregiver about all other medicines that you take (both prescription and non-prescription). In some cases, you may need to stop or change medicines near the time of your surgery. This is most important if you are taking blood-thinning medicine.Do not stop medicines unless you are told to do so.  Arrange for someone to drive you to and from the procedure.  Do not put contact lenses in either eye on the day of your surgery. PROCEDURE There is more than one method for safely removing a cataract. Your doctor can explain the differences and help determine which is best for you. Phacoemulsification surgery is the most common form of cataract surgery.  An injection is given behind the eye or eyedrops are given to make this a painless procedure.  A small cut (incision) is made on the edge of the clear, dome-shaped surface that covers the front of the eye (cornea).  A tiny probe is painlessly inserted into the eye. This device gives off ultrasound waves that soften and break up the  cloudy center of the lens. This makes it easier for the cloudy lens to be removed by suction.  An IOL may be implanted.  The normal lens of the eye is covered by a clear capsule. Part of that capsule is intentionally left in the eye to support the IOL.  Your surgeon may or may not use stitches to close the incision. There are other forms of cataract surgery that require a larger incision and stiches to close the eye. This approach is taken in cases where the doctor feels that the cataract cannot be easily removed using phacoemulsification. AFTER THE  PROCEDURE  When an IOL is implanted, it does not need care. It becomes a permanent part of your eye and cannot be seen or felt.  Your doctor will schedule follow-up exams to check on your progress.  Review your other medicines with your doctor to see which can be resumed after surgery.  Use eyedrops or take medicine as prescribed by your doctor. Document Released: 08/20/2011 Document Revised: 11/23/2011 Document Reviewed: 08/20/2011 [facility] Patient Information 2013 [place].    PATIENT INSTRUCTIONS POST-ANESTHESIA  IMMEDIATELY FOLLOWING SURGERY:  Do not drive or operate machinery for the first twenty four hours after surgery.  Do not make any important decisions for twenty four hours after surgery or while taking narcotic pain medications or sedatives.  If you develop intractable nausea and vomiting or a severe headache please notify your doctor immediately.  FOLLOW-UP:  Please make an appointment with your surgeon as instructed. You do not need to follow up with anesthesia unless specifically instructed to do so.  WOUND CARE INSTRUCTIONS (if applicable):  Keep a dry clean dressing on the anesthesia/puncture wound site if there is drainage.  Once the wound has quit draining you may leave it open to air.  Generally you should leave the bandage intact for twenty four hours unless there is drainage.  If the epidural site drains for more than 36-48 hours please call the anesthesia department.  QUESTIONS?:  Please feel free to call your physician or the hospital operator if you have any questions, and they will be happy to assist you.

## 2014-01-22 ENCOUNTER — Ambulatory Visit (HOSPITAL_COMMUNITY)
Admission: RE | Admit: 2014-01-22 | Discharge: 2014-01-22 | Disposition: A | Discharge status: Home / Self Care | Payer: Medicare Other | Source: Ambulatory Visit | Attending: Ophthalmology | Admitting: Ophthalmology

## 2014-01-22 ENCOUNTER — Encounter (HOSPITAL_COMMUNITY): Payer: Self-pay | Admitting: *Deleted

## 2014-01-22 ENCOUNTER — Encounter (HOSPITAL_COMMUNITY)
Admission: RE | Disposition: A | Discharge status: Home / Self Care | Payer: Self-pay | Source: Ambulatory Visit | Attending: Ophthalmology

## 2014-01-22 ENCOUNTER — Encounter (HOSPITAL_COMMUNITY): Payer: Medicare Other | Admitting: Anesthesiology

## 2014-01-22 ENCOUNTER — Ambulatory Visit (HOSPITAL_COMMUNITY): Payer: Medicare Other | Admitting: Anesthesiology

## 2014-01-22 DIAGNOSIS — I252 Old myocardial infarction: Secondary | ICD-10-CM | POA: Insufficient documentation

## 2014-01-22 DIAGNOSIS — J449 Chronic obstructive pulmonary disease, unspecified: Secondary | ICD-10-CM | POA: Insufficient documentation

## 2014-01-22 DIAGNOSIS — J4489 Other specified chronic obstructive pulmonary disease: Secondary | ICD-10-CM | POA: Insufficient documentation

## 2014-01-22 DIAGNOSIS — H2589 Other age-related cataract: Secondary | ICD-10-CM | POA: Insufficient documentation

## 2014-01-22 DIAGNOSIS — Z79899 Other long term (current) drug therapy: Secondary | ICD-10-CM | POA: Insufficient documentation

## 2014-01-22 DIAGNOSIS — I2510 Atherosclerotic heart disease of native coronary artery without angina pectoris: Secondary | ICD-10-CM | POA: Insufficient documentation

## 2014-01-22 DIAGNOSIS — N289 Disorder of kidney and ureter, unspecified: Secondary | ICD-10-CM | POA: Insufficient documentation

## 2014-01-22 DIAGNOSIS — I498 Other specified cardiac arrhythmias: Secondary | ICD-10-CM | POA: Insufficient documentation

## 2014-01-22 DIAGNOSIS — I10 Essential (primary) hypertension: Secondary | ICD-10-CM | POA: Insufficient documentation

## 2014-01-22 DIAGNOSIS — Z87891 Personal history of nicotine dependence: Secondary | ICD-10-CM | POA: Insufficient documentation

## 2014-01-22 DIAGNOSIS — G4730 Sleep apnea, unspecified: Secondary | ICD-10-CM | POA: Insufficient documentation

## 2014-01-22 DIAGNOSIS — Z9861 Coronary angioplasty status: Secondary | ICD-10-CM | POA: Insufficient documentation

## 2014-01-22 HISTORY — PX: CATARACT EXTRACTION W/PHACO: SHX586

## 2014-01-22 SURGERY — PHACOEMULSIFICATION, CATARACT, WITH IOL INSERTION
Anesthesia: Monitor Anesthesia Care | Site: Eye | Laterality: Right

## 2014-01-22 MED ORDER — CYCLOPENTOLATE-PHENYLEPHRINE 0.2-1 % OP SOLN
1.0000 [drp] | OPHTHALMIC | Status: AC
  Administered 2014-01-22 (×3): 1 [drp] via OPHTHALMIC

## 2014-01-22 MED ORDER — LACTATED RINGERS IV SOLN
INTRAVENOUS | Status: DC
  Administered 2014-01-22: 07:00:00 via INTRAVENOUS

## 2014-01-22 MED ORDER — CYCLOPENTOLATE-PHENYLEPHRINE OP SOLN OPTIME - NO CHARGE
OPHTHALMIC | Status: AC
  Filled 2014-01-22: qty 2

## 2014-01-22 MED ORDER — PHENYLEPHRINE HCL 2.5 % OP SOLN
OPHTHALMIC | Status: AC
  Filled 2014-01-22: qty 15

## 2014-01-22 MED ORDER — FENTANYL CITRATE 0.05 MG/ML IJ SOLN
INTRAMUSCULAR | Status: AC
  Filled 2014-01-22: qty 2

## 2014-01-22 MED ORDER — BSS IO SOLN
INTRAOCULAR | Status: DC | PRN
  Administered 2014-01-22: 15 mL via INTRAOCULAR

## 2014-01-22 MED ORDER — MIDAZOLAM HCL 2 MG/2ML IJ SOLN
INTRAMUSCULAR | Status: AC
  Filled 2014-01-22: qty 2

## 2014-01-22 MED ORDER — NEOMYCIN-POLYMYXIN-DEXAMETH 3.5-10000-0.1 OP SUSP
OPHTHALMIC | Status: DC | PRN
  Administered 2014-01-22: 2 [drp] via OPHTHALMIC

## 2014-01-22 MED ORDER — PROVISC 10 MG/ML IO SOLN
INTRAOCULAR | Status: DC | PRN
  Administered 2014-01-22: 0.85 mL via INTRAOCULAR

## 2014-01-22 MED ORDER — LIDOCAINE HCL 3.5 % OP GEL
OPHTHALMIC | Status: AC
  Filled 2014-01-22: qty 1

## 2014-01-22 MED ORDER — TETRACAINE HCL 0.5 % OP SOLN
1.0000 [drp] | OPHTHALMIC | Status: AC
  Administered 2014-01-22 (×3): 1 [drp] via OPHTHALMIC

## 2014-01-22 MED ORDER — LIDOCAINE HCL (PF) 1 % IJ SOLN
INTRAMUSCULAR | Status: AC
  Filled 2014-01-22: qty 2

## 2014-01-22 MED ORDER — EPINEPHRINE HCL 1 MG/ML IJ SOLN
INTRAOCULAR | Status: DC | PRN
  Administered 2014-01-22: 08:00:00

## 2014-01-22 MED ORDER — LIDOCAINE HCL (PF) 1 % IJ SOLN
INTRAMUSCULAR | Status: DC | PRN
  Administered 2014-01-22: .7 mL

## 2014-01-22 MED ORDER — EPINEPHRINE HCL 1 MG/ML IJ SOLN
INTRAMUSCULAR | Status: AC
  Filled 2014-01-22: qty 1

## 2014-01-22 MED ORDER — POVIDONE-IODINE 5 % OP SOLN
OPHTHALMIC | Status: DC | PRN
  Administered 2014-01-22: 1 via OPHTHALMIC

## 2014-01-22 MED ORDER — LIDOCAINE HCL 3.5 % OP GEL: 1.0000 "application " | Freq: Once | OPHTHALMIC | Status: DC

## 2014-01-22 MED ORDER — NEOMYCIN-POLYMYXIN-DEXAMETH 3.5-10000-0.1 OP SUSP
OPHTHALMIC | Status: AC
  Filled 2014-01-22: qty 5

## 2014-01-22 MED ORDER — PHENYLEPHRINE HCL 2.5 % OP SOLN
1.0000 [drp] | OPHTHALMIC | Status: AC
  Administered 2014-01-22 (×3): 1 [drp] via OPHTHALMIC

## 2014-01-22 MED ORDER — FENTANYL CITRATE 0.05 MG/ML IJ SOLN
25.0000 ug | INTRAMUSCULAR | Status: AC
  Administered 2014-01-22 (×2): 25 ug via INTRAVENOUS

## 2014-01-22 MED ORDER — TETRACAINE HCL 0.5 % OP SOLN
OPHTHALMIC | Status: AC
  Filled 2014-01-22: qty 2

## 2014-01-22 MED ORDER — MIDAZOLAM HCL 2 MG/2ML IJ SOLN
1.0000 mg | INTRAMUSCULAR | Status: DC | PRN
  Administered 2014-01-22: 2 mg via INTRAVENOUS

## 2014-01-22 SURGICAL SUPPLY — 33 items
CAPSULAR TENSION RING-AMO (OPHTHALMIC RELATED) IMPLANT
CLOTH BEACON ORANGE TIMEOUT ST (SAFETY) ×3 IMPLANT
EYE SHIELD UNIVERSAL CLEAR (GAUZE/BANDAGES/DRESSINGS) ×3 IMPLANT
GLOVE BIO SURGEON STRL SZ 6.5 (GLOVE) IMPLANT
GLOVE BIO SURGEONS STRL SZ 6.5 (GLOVE)
GLOVE BIOGEL PI IND STRL 6.5 (GLOVE) IMPLANT
GLOVE BIOGEL PI IND STRL 7.0 (GLOVE) ×1 IMPLANT
GLOVE BIOGEL PI IND STRL 7.5 (GLOVE) ×1 IMPLANT
GLOVE BIOGEL PI INDICATOR 6.5 (GLOVE)
GLOVE BIOGEL PI INDICATOR 7.0 (GLOVE) ×2
GLOVE BIOGEL PI INDICATOR 7.5 (GLOVE) ×2
GLOVE ECLIPSE 6.5 STRL STRAW (GLOVE) IMPLANT
GLOVE ECLIPSE 7.0 STRL STRAW (GLOVE) IMPLANT
GLOVE ECLIPSE 7.5 STRL STRAW (GLOVE) IMPLANT
GLOVE EXAM NITRILE LRG STRL (GLOVE) IMPLANT
GLOVE EXAM NITRILE MD LF STRL (GLOVE) IMPLANT
GLOVE SKINSENSE NS SZ6.5 (GLOVE)
GLOVE SKINSENSE NS SZ7.0 (GLOVE)
GLOVE SKINSENSE STRL SZ6.5 (GLOVE) IMPLANT
GLOVE SKINSENSE STRL SZ7.0 (GLOVE) IMPLANT
KIT VITRECTOMY (OPHTHALMIC RELATED) IMPLANT
PAD ARMBOARD 7.5X6 YLW CONV (MISCELLANEOUS) ×3 IMPLANT
PROC W NO LENS (INTRAOCULAR LENS)
PROC W SPEC LENS (INTRAOCULAR LENS)
PROCESS W NO LENS (INTRAOCULAR LENS) IMPLANT
PROCESS W SPEC LENS (INTRAOCULAR LENS) IMPLANT
RING MALYGIN (MISCELLANEOUS) IMPLANT
SIGHTPATH CAT PROC W REG LENS (Ophthalmic Related) ×3 IMPLANT
SYR TB 1ML LL NO SAFETY (SYRINGE) ×3 IMPLANT
TAPE SURG TRANSPORE 1 IN (GAUZE/BANDAGES/DRESSINGS) ×1 IMPLANT
TAPE SURGICAL TRANSPORE 1 IN (GAUZE/BANDAGES/DRESSINGS) ×2
VISCOELASTIC ADDITIONAL (OPHTHALMIC RELATED) IMPLANT
WATER STERILE IRR 250ML POUR (IV SOLUTION) ×3 IMPLANT

## 2014-01-22 NOTE — Anesthesia Preprocedure Evaluation (Addendum)
Anesthesia Evaluation  Patient identified by MRN, date of birth, ID band Patient awake    Reviewed: Allergy & Precautions, H&P , NPO status , Patient's Chart, lab work & pertinent test results, reviewed documented beta blocker date and time   Airway Mallampati: II TM Distance: >3 FB     Dental  (+) Edentulous Upper, Edentulous Lower   Pulmonary shortness of breath and with exertion, sleep apnea , COPDformer smoker,  breath sounds clear to auscultation        Cardiovascular hypertension, Pt. on medications and Pt. on home beta blockers + CAD, + Past MI and + Cardiac Stents Rhythm:Regular Rate:Bradycardia     Neuro/Psych    GI/Hepatic   Endo/Other    Renal/GU Renal disease     Musculoskeletal   Abdominal   Peds  Hematology   Anesthesia Other Findings   Reproductive/Obstetrics                          Anesthesia Physical Anesthesia Plan  ASA: III  Anesthesia Plan: MAC   Post-op Pain Management:    Induction: Intravenous  Airway Management Planned: Nasal Cannula  Additional Equipment:   Intra-op Plan:   Post-operative Plan:   Informed Consent: I have reviewed the patients History and Physical, chart, labs and discussed the procedure including the risks, benefits and alternatives for the proposed anesthesia with the patient or authorized representative who has indicated his/her understanding and acceptance.     Plan Discussed with:   Anesthesia Plan Comments:         Anesthesia Quick Evaluation

## 2014-01-22 NOTE — Op Note (Signed)
Date of Admission: 01/22/2014  Date of Surgery: 01/22/2014   Pre-Op Dx: Cataract Right Eye  Post-Op Dx: Combined Cataract Right  Eye,  Dx Code 366.19  Surgeon: Tonny Branch, M.D.  Assistants: None  Anesthesia: Topical with MAC  Indications: Painless, progressive loss of vision with compromise of daily activities.  Surgery: Cataract Extraction with Intraocular lens Implant Right Eye  Discription: The patient had dilating drops and viscous lidocaine placed into the Right eye in the pre-op holding area. After transfer to the operating room, a time out was performed. The patient was then prepped and draped. Beginning with a 66 degree blade a paracentesis port was made at the surgeon's 2 o'clock position. The anterior chamber was then filled with 1% non-preserved lidocaine. This was followed by filling the anterior chamber with Provisc.  A 2.14mm keratome blade was used to make a clear corneal incision at the temporal limbus.  A bent cystatome needle was used to create a continuous tear capsulotomy. Hydrodissection was performed with balanced salt solution on a Fine canula. The lens nucleus was then removed using the phacoemulsification handpiece. Residual cortex was removed with the I&A handpiece. The anterior chamber and capsular bag were refilled with Provisc. A posterior chamber intraocular lens was placed into the capsular bag with it's injector. The implant was positioned with the Kuglan hook. The Provisc was then removed from the anterior chamber and capsular bag with the I&A handpiece. Stromal hydration of the main incision and paracentesis port was performed with BSS on a Fine canula. The wounds were tested for leak which was negative. The patient tolerated the procedure well. There were no operative complications. The patient was then transferred to the recovery room in stable condition.  Complications: None  Specimen: None  EBL: None  Prosthetic device: Hoya iSert 250, power 21.0 D, SN  [id number].

## 2014-01-22 NOTE — H&P (Signed)
I have reviewed the H&P, the patient was re-examined, and I have identified no interval changes in medical condition and plan of care since the history and physical of record  

## 2014-01-22 NOTE — Anesthesia Postprocedure Evaluation (Signed)
  Anesthesia Post-op Note  Patient: Jeremy [name]  Procedure(s) Performed: Procedure(s) with comments: CATARACT EXTRACTION PHACO AND INTRAOCULAR LENS PLACEMENT (IOC) (Right) - CDE 7.46  Patient Location: Short Stay  Anesthesia Type:MAC  Level of Consciousness: awake, alert , oriented and patient cooperative  Airway and Oxygen Therapy: Patient Spontanous Breathing  Post-op Pain: none  Post-op Assessment: Post-op Vital signs reviewed, Patient's Cardiovascular Status Stable, Respiratory Function Stable, Patent Airway and Pain level controlled  Post-op Vital Signs: Reviewed and stable  Last Vitals:  Filed Vitals:   01/22/14 0622  Temp: 35.6 C    Complications: No apparent anesthesia complications

## 2014-01-22 NOTE — Transfer of Care (Signed)
Immediate Anesthesia Transfer of Care Note  Patient: Jeremy [name]  Procedure(s) Performed: Procedure(s) with comments: CATARACT EXTRACTION PHACO AND INTRAOCULAR LENS PLACEMENT (IOC) (Right) - CDE 7.46  Patient Location: Short Stay  Anesthesia Type:MAC  Level of Consciousness: awake, alert , oriented and patient cooperative  Airway & Oxygen Therapy: Patient Spontanous Breathing  Post-op Assessment: Report given to PACU RN, Post -op Vital signs reviewed and stable and Patient moving all extremities  Post vital signs: Reviewed and stable  Complications: No apparent anesthesia complications

## 2014-01-23 ENCOUNTER — Encounter (HOSPITAL_COMMUNITY): Payer: Self-pay | Admitting: Ophthalmology

## 2014-03-15 ENCOUNTER — Encounter: Payer: Self-pay | Admitting: Cardiology

## 2014-03-15 DIAGNOSIS — I739 Peripheral vascular disease, unspecified: Secondary | ICD-10-CM

## 2014-03-15 DIAGNOSIS — I779 Disorder of arteries and arterioles, unspecified: Secondary | ICD-10-CM | POA: Insufficient documentation

## 2014-03-19 ENCOUNTER — Encounter: Payer: Self-pay | Admitting: *Deleted

## 2014-03-19 ENCOUNTER — Encounter: Payer: Self-pay | Admitting: Cardiology

## 2014-03-19 ENCOUNTER — Ambulatory Visit (INDEPENDENT_AMBULATORY_CARE_PROVIDER_SITE_OTHER): Payer: Medicare Other | Admitting: Cardiology

## 2014-03-19 VITALS — BP 122/80 | HR 51 | Ht 72.0 in | Wt 193.0 lb

## 2014-03-19 DIAGNOSIS — E785 Hyperlipidemia, unspecified: Secondary | ICD-10-CM

## 2014-03-19 DIAGNOSIS — R001 Bradycardia, unspecified: Secondary | ICD-10-CM | POA: Insufficient documentation

## 2014-03-19 DIAGNOSIS — I739 Peripheral vascular disease, unspecified: Secondary | ICD-10-CM

## 2014-03-19 DIAGNOSIS — I2510 Atherosclerotic heart disease of native coronary artery without angina pectoris: Secondary | ICD-10-CM

## 2014-03-19 DIAGNOSIS — F17200 Nicotine dependence, unspecified, uncomplicated: Secondary | ICD-10-CM

## 2014-03-19 DIAGNOSIS — Z720 Tobacco use: Secondary | ICD-10-CM

## 2014-03-19 DIAGNOSIS — I779 Disorder of arteries and arterioles, unspecified: Secondary | ICD-10-CM

## 2014-03-19 DIAGNOSIS — I10 Essential (primary) hypertension: Secondary | ICD-10-CM

## 2014-03-19 NOTE — Patient Instructions (Addendum)
Your physician recommends that you schedule a follow-up appointment in: 3 months. You will receive a reminder letter in the mail in about 4 months reminding you to call and schedule your appointment. If you don't receive this letter, please contact our office. Your physician has recommended you make the following change in your medication:  Stop metoprolol. Continue all other medications the same. Your physician has requested that you have a lexiscan myoview. For further information please visit [email or web address]. Please follow instruction sheet, as given.

## 2014-03-19 NOTE — Assessment & Plan Note (Signed)
He continues to chew some tobacco. I've counseled him to stop.

## 2014-03-19 NOTE — Assessment & Plan Note (Addendum)
The patient has known coronary disease. His last stress study was 2005. Recently he was in extreme heat and he is not felt completely normal since then. He's not having chest pain. His last stress test was done 10 years ago. We will proceed with a limited stress Lexiscan Myoview. Hold be in touch with her with the information. Otherwise I'll see him in 3 months to reassess his overall status.  Part of today's evaluation I spent greater than 25 minutes for this total care. More than half of this time was with a complete discussion with the patient and his wife about his overall symptoms and the plans.

## 2014-03-19 NOTE — Progress Notes (Signed)
Patient ID: Jeremy [name], male   DOB: 01/01/1936, 78 y.o.   MRN: [id number]    HPI  Patient is seen for followup of coronary disease. I saw him last in June, 2014. He has been stable over many many years. He has stent to his LAD in 1991. At that time he had significant chest pain. He has never had return of chest discomfort. He had an exercise test in 2005. He had an echo in 2011 revealing ejection fraction of 55%. His carotid Doppler in July, 2014 was stable with mild disease. Recently during a period of extreme heat he did outside work that was excessive. He had some weakness over several days and is now feeling better. He did not have any chest pain. However he does not yet feel back to normal.  No Known Allergies  Current Outpatient Prescriptions  Medication Sig Dispense Refill  . aspirin 81 MG tablet Take 81 mg by mouth daily.      . metoprolol (LOPRESSOR) 50 MG tablet Take 0.5 tablets (25 mg total) by mouth at bedtime.  15 tablet  6  . nitroGLYCERIN (NITROSTAT) 0.4 MG SL tablet Place 1 tablet (0.4 mg total) under the tongue every 5 (five) minutes as needed.  25 tablet  3  . simvastatin (ZOCOR) 40 MG tablet Take 1 tablet (40 mg total) by mouth every evening.  30 tablet  6   No current facility-administered medications for this visit.    History   Social History  . Marital Status: Married    Spouse Name: N/A    Number of Children: N/A  . Years of Education: N/A   Occupational History  . Not on file.   Social History Main Topics  . Smoking status: Former Smoker -- 3.00 packs/day for 12 years    Types: Cigarettes    Quit date: 09/15/1995  . Smokeless tobacco: Current User    Types: Chew     Comment: chews 80% of a pack tobacco per day  . Alcohol Use: No  . Drug Use: No  . Sexual Activity: Not on file   Other Topics Concern  . Not on file   Social History Narrative  . No narrative on file    No family history on file.  Past Medical History  Diagnosis Date  .  Hypertension   . Hyperlipidemia   . CAD (coronary artery disease) 2005    anterior MI with stent to the LAD in 1991 / stent 1996 / nuclear 2005, no ischemia  . Abnormal CXR 4/20011    Chest  CT mode/serere COPD ( no mediastinal abnormality)  . Ejection fraction     55-60%, echo, April, 2011, could not estimate RV pressure  . Tobacco abuse   . COPD (chronic obstructive pulmonary disease)   . Carotid bruit     Doppler, June, 2013, 0-39% bilateral  . Kidney stone   . Shortness of breath     with exertion  . HOH (hard of hearing)   . Sleep apnea     Stop Bang score of 5    Past Surgical History  Procedure Laterality Date  . Cholecystectomy      8 years ago [facility]  . Cyst removal hand      right wrist  . Colonoscopy  10/05/2012    Procedure: COLONOSCOPY;  Surgeon: Rogene Houston, MD;  Location: [facility];  Service: Endoscopy;  Laterality: N/A;  1030  . Appendectomy    . Cataract extraction  Left   . Cardiac catheterization    . Tonsillectomy    . Skin cancer excision  2015    ear & neck  . Cataract extraction w/phaco Right 01/22/2014    Procedure: CATARACT EXTRACTION PHACO AND INTRAOCULAR LENS PLACEMENT (IOC);  Surgeon: Tonny Branch, MD;  Location: [facility];  Service: Ophthalmology;  Laterality: Right;  CDE 7.46    Patient Active Problem List   Diagnosis Date Noted  . Carotid artery disease 03/15/2014  . Carotid bruit   . Hypertension   . Hyperlipidemia   . CAD (coronary artery disease)   . Abnormal CXR   . Ejection fraction   . Tobacco abuse   . COPD (chronic obstructive pulmonary disease)     ROS   Patient denies fever, chills, headache, sweats, rash, change in vision, change in hearing, chest pain, cough, nausea or vomiting, urinary symptoms. All other systems are reviewed and are negative.  PHYSICAL EXAM  Patient oriented to person time and place. Affect is normal. He's here with his wife. Head is atraumatic. Sclera and conjunctiva are normal. There is no jugulovenous  distention. Lungs are clear. Respiratory effort is nonlabored. Cardiac exam reveals S1 and S2. Abdomen is soft. Is no peripheral edema. There is no musculoskeletal deformities. There are no skin rashes.  Filed Vitals:   03/19/14 1339  BP: 122/80  Pulse: 51  Height: 6' (1.829 m)  Weight: 193 lb (87.544 kg)    EKG is done today and reviewed by me. There is sinus bradycardia with a rate of 51. The QRS is normal. ASSESSMENT & PLAN

## 2014-03-19 NOTE — Assessment & Plan Note (Signed)
Blood pressure is controlled. No change in therapy. 

## 2014-03-19 NOTE — Assessment & Plan Note (Signed)
He has mild carotid disease. This study can be repeated next year.

## 2014-03-19 NOTE — Assessment & Plan Note (Signed)
His lipids are being treated. No change in therapy.   

## 2014-03-19 NOTE — Assessment & Plan Note (Signed)
The patient is having some fatigue and he has mild sinus bradycardia at rest. We will stop the low dose of metoprolol.

## 2014-03-27 ENCOUNTER — Encounter: Payer: Self-pay | Admitting: Cardiology

## 2014-04-04 ENCOUNTER — Ambulatory Visit (HOSPITAL_COMMUNITY): Payer: Medicare Other | Attending: Cardiovascular Disease | Admitting: Radiology

## 2014-04-04 VITALS — Ht 72.0 in | Wt 186.0 lb

## 2014-04-04 DIAGNOSIS — Z87891 Personal history of nicotine dependence: Secondary | ICD-10-CM | POA: Insufficient documentation

## 2014-04-04 DIAGNOSIS — R5383 Other fatigue: Principal | ICD-10-CM

## 2014-04-04 DIAGNOSIS — Z8249 Family history of ischemic heart disease and other diseases of the circulatory system: Secondary | ICD-10-CM | POA: Diagnosis not present

## 2014-04-04 DIAGNOSIS — R42 Dizziness and giddiness: Secondary | ICD-10-CM | POA: Diagnosis not present

## 2014-04-04 DIAGNOSIS — Z9861 Coronary angioplasty status: Secondary | ICD-10-CM | POA: Insufficient documentation

## 2014-04-04 DIAGNOSIS — I2510 Atherosclerotic heart disease of native coronary artery without angina pectoris: Secondary | ICD-10-CM | POA: Diagnosis not present

## 2014-04-04 DIAGNOSIS — R0989 Other specified symptoms and signs involving the circulatory and respiratory systems: Secondary | ICD-10-CM | POA: Insufficient documentation

## 2014-04-04 DIAGNOSIS — I252 Old myocardial infarction: Secondary | ICD-10-CM | POA: Insufficient documentation

## 2014-04-04 DIAGNOSIS — R0602 Shortness of breath: Secondary | ICD-10-CM

## 2014-04-04 DIAGNOSIS — R5381 Other malaise: Secondary | ICD-10-CM | POA: Insufficient documentation

## 2014-04-04 DIAGNOSIS — I10 Essential (primary) hypertension: Secondary | ICD-10-CM | POA: Insufficient documentation

## 2014-04-04 DIAGNOSIS — R0609 Other forms of dyspnea: Secondary | ICD-10-CM | POA: Insufficient documentation

## 2014-04-04 MED ORDER — TECHNETIUM TC 99M SESTAMIBI GENERIC - CARDIOLITE
11.0000 | Freq: Once | INTRAVENOUS | Status: AC | PRN
  Administered 2014-04-04: 11 via INTRAVENOUS

## 2014-04-04 MED ORDER — REGADENOSON 0.4 MG/5ML IV SOLN
0.4000 mg | Freq: Once | INTRAVENOUS | Status: AC
  Administered 2014-04-04: 0.4 mg via INTRAVENOUS

## 2014-04-04 MED ORDER — TECHNETIUM TC 99M SESTAMIBI GENERIC - CARDIOLITE
33.0000 | Freq: Once | INTRAVENOUS | Status: AC | PRN
  Administered 2014-04-04: 33 via INTRAVENOUS

## 2014-04-04 NOTE — Progress Notes (Signed)
Jeremy [name] [address] [place], [place] [address] [phone]    Cardiology Nuclear Med Study  Jeremy [name] is a 78 y.o. male     MRN : [id number]     DOB: 1935/12/17  Procedure Date: 04/04/2014  Nuclear Med Background Indication for Stress Test:  Evaluation for Ischemia and Stent Patency History:CAD;MI;CATH;STENT (LAD 91',STENT 96')ECHO 2011 EF:55%;PREVIOUS NUCLEAR STUDY 2005 (SCAR EF:64%/NO ISCHEMIA;SM PRIOR INFEROAPICAL INFARCT)COPD/OSA Cardiac Risk Factors: Carotid Disease, Family History - CAD, History of Smoking, Hypertension and Lipids  Symptoms:  DOE and Fatigue   Nuclear Pre-Procedure Caffeine/Decaff Intake:  None NPO After: 6 pm   Lungs:  clear O2 Sat: 96% on room air. IV 0.9% NS with Angio Cath:  22g  IV Site: R Hand  IV Started by:  Crissie Figures, RN  Chest Size (in):  46 Cup Size: n/a  Height: 6' (1.829 m)  Weight:  186 lb (84.369 kg)  BMI:  Body mass index is 25.22 kg/(m^2). Tech Comments:  N/A    Nuclear Med Study 1 or 2 day study: 1 day  Stress Test Type:  Lexiscan  Reading MD: N/A  Order Authorizing Provider:  Dola Argyle, MD  Resting Radionuclide: Technetium 74m Sestamibi  Resting Radionuclide Dose: 11.0 mCi   Stress Radionuclide:  Technetium 5m Sestamibi  Stress Radionuclide Dose: 33.0 mCi           Stress Protocol Rest HR: 64 Stress HR: 96  Rest BP: 126/83 Stress BP: 136/77  Exercise Time (min): n/a METS: n/a           Dose of Adenosine (mg):  n/a Dose of Lexiscan: 0.4 mg  Dose of Atropine (mg): n/a Dose of Dobutamine: n/a mcg/kg/min (at max HR)  Stress Test Technologist: Ileene Hutchinson, EMT-P  Nuclear Technologist:  Vedia Pereyra, CNMT     Rest Procedure:  Myocardial perfusion imaging was performed at rest 45 minutes following the intravenous administration of Technetium 69m Sestamibi. Rest ECG: NSR - Normal EKG  Stress Procedure:  The patient received IV Lexiscan 0.4 mg over 15-seconds.  Technetium 24m  Sestamibi injected at 30-seconds.  Quantitative spect images were obtained after a 45 minute delay. Stress ECG: No significant change from baseline ECG  QPS Raw Data Images:  Mild diaphragmatic attenuation.  Normal left ventricular size. Stress Images:  There is decreased uptake in the inferior wall. Rest Images:  There is decreased uptake in the inferior wall. Subtraction (SDS):  No evidence of ischemia. Transient Ischemic Dilatation (Normal <1.22):  1.07 Lung/Heart Ratio (Normal <0.45):  0.28  Quantitative Gated Spect Images QGS EDV:  109 ml QGS ESV:  58 ml  Impression Exercise Capacity:  Lexiscan with no exercise. BP Response:  Normal blood pressure response. Clinical Symptoms:  dizziness ECG Impression:  No significant ST segment change suggestive of ischemia. Comparison with Prior Nuclear Study: No images to compare  Overall Impression:  Low risk stress nuclear study with a fixed defect in the inferior wall consistent with diaphragmatic attenuation..  LV Ejection Fraction: 47%.  LV Wall Motion:  NL LV Function; NL Wall Motion  Signed: Fransico Him, MD [place]

## 2014-04-12 ENCOUNTER — Telehealth: Payer: Self-pay | Admitting: *Deleted

## 2014-04-12 ENCOUNTER — Encounter: Payer: Self-pay | Admitting: Cardiology

## 2014-04-12 NOTE — Telephone Encounter (Signed)
Message copied by Merlene Laughter on Thu Apr 12, 2014  9:03 AM ------      Message from: Ron Parker, [place]      Created: Thu Apr 12, 2014  8:47 AM       Please let the patient know that the nuclear stress test looks good. No further evaluation at this time. ------

## 2014-04-12 NOTE — Telephone Encounter (Signed)
Patient informed. 

## 2014-06-04 ENCOUNTER — Other Ambulatory Visit: Payer: Self-pay

## 2014-06-04 MED ORDER — SIMVASTATIN 40 MG PO TABS: 40.0000 mg | ORAL_TABLET | Freq: Every evening | ORAL | Status: DC

## 2014-06-25 ENCOUNTER — Ambulatory Visit (INDEPENDENT_AMBULATORY_CARE_PROVIDER_SITE_OTHER): Payer: Medicare Other | Admitting: Cardiology

## 2014-06-25 ENCOUNTER — Encounter: Payer: Self-pay | Admitting: Cardiology

## 2014-06-25 VITALS — BP 110/76 | HR 102 | Ht 72.0 in | Wt 191.5 lb

## 2014-06-25 DIAGNOSIS — R001 Bradycardia, unspecified: Secondary | ICD-10-CM

## 2014-06-25 DIAGNOSIS — R9430 Abnormal result of cardiovascular function study, unspecified: Secondary | ICD-10-CM

## 2014-06-25 DIAGNOSIS — I779 Disorder of arteries and arterioles, unspecified: Secondary | ICD-10-CM

## 2014-06-25 DIAGNOSIS — I2510 Atherosclerotic heart disease of native coronary artery without angina pectoris: Secondary | ICD-10-CM

## 2014-06-25 DIAGNOSIS — R0989 Other specified symptoms and signs involving the circulatory and respiratory systems: Secondary | ICD-10-CM

## 2014-06-25 DIAGNOSIS — I739 Peripheral vascular disease, unspecified: Secondary | ICD-10-CM

## 2014-06-25 MED ORDER — NITROGLYCERIN 0.4 MG SL SUBL: 0.4000 mg | SUBLINGUAL_TABLET | SUBLINGUAL | Status: DC | PRN

## 2014-06-25 NOTE — Assessment & Plan Note (Signed)
Patient had mild sinus bradycardia in the office on very low-dose metoprolol. We stopped it he feels much better. However his resting heart rate today in the office is mildly increased. He feels so much better I am not inclined to make any changes at this time.

## 2014-06-25 NOTE — Assessment & Plan Note (Signed)
His ejection fraction by nuclear was lower in July than his prior echo many years ago. When I see him back I will consider followup echo.

## 2014-06-25 NOTE — Assessment & Plan Note (Signed)
Coronary disease is stable. He has no ischemia by recent nuclear study. His EF is low normal binuclear criteria. No further workup at this time.

## 2014-06-25 NOTE — Assessment & Plan Note (Signed)
The patient has very mild carotid disease. I will arrange for a followup Doppler when I see him next.

## 2014-06-25 NOTE — Progress Notes (Signed)
Patient ID: Jeremy [name], male   DOB: [birth year], 78 y.o.   MRN: [id number]    HPI  Patient is seen today to followup coronary disease. I saw him last July, 2015. His resting heart rate was 51. He was having some fatigue. He was on only a small dose of metoprolol. He had not undergone any exercise testing for many many years. We stopped his metoprolol. He says he feels much better. His nuclear stress study showed no ischemia. Ejection fraction by nuclear study was 47%.  No Known Allergies  Current Outpatient Prescriptions  Medication Sig Dispense Refill  . aspirin 81 MG tablet Take 81 mg by mouth daily.      . nitroGLYCERIN (NITROSTAT) 0.4 MG SL tablet Place 1 tablet (0.4 mg total) under the tongue every 5 (five) minutes as needed.  25 tablet  3  . simvastatin (ZOCOR) 40 MG tablet Take 1 tablet (40 mg total) by mouth every evening.  30 tablet  2   No current facility-administered medications for this visit.    History   Social History  . Marital Status: Married    Spouse Name: N/A    Number of Children: N/A  . Years of Education: N/A   Occupational History  . Not on file.   Social History Main Topics  . Smoking status: Former Smoker -- 3.00 packs/day for 12 years    Types: Cigarettes    Quit date: 09/15/1995  . Smokeless tobacco: Current User    Types: Chew     Comment: chews 80% of a pack tobacco per day -- 10/12 doesnt chew too much  . Alcohol Use: No  . Drug Use: No  . Sexual Activity: Not on file   Other Topics Concern  . Not on file   Social History Narrative  . No narrative on file    No family history on file.  Past Medical History  Diagnosis Date  . Hypertension   . Hyperlipidemia   . CAD (coronary artery disease) 2005    anterior MI with stent to the LAD in 1991 / stent 1996 / nuclear 2005, no ischemia  . Abnormal CXR 4/20011    Chest  CT mode/serere COPD ( no mediastinal abnormality)  . Ejection fraction     55-60%, echo, April, 2011, could not  estimate RV pressure  . Tobacco abuse   . COPD (chronic obstructive pulmonary disease)   . Carotid bruit     Doppler, June, 2013, 0-39% bilateral  . Kidney stone   . Shortness of breath     with exertion  . HOH (hard of hearing)   . Sleep apnea     Stop Bang score of 5    Past Surgical History  Procedure Laterality Date  . Cholecystectomy      8 years ago [facility]  . Cyst removal hand      right wrist  . Colonoscopy  10/05/2012    Procedure: COLONOSCOPY;  Surgeon: Rogene Houston, MD;  Location: [facility];  Service: Endoscopy;  Laterality: N/A;  1030  . Appendectomy    . Cataract extraction Left   . Cardiac catheterization    . Tonsillectomy    . Skin cancer excision  2015    ear & neck  . Cataract extraction w/phaco Right 01/22/2014    Procedure: CATARACT EXTRACTION PHACO AND INTRAOCULAR LENS PLACEMENT (IOC);  Surgeon: Tonny Branch, MD;  Location: [facility];  Service: Ophthalmology;  Laterality: Right;  CDE 7.46  Patient Active Problem List   Diagnosis Date Noted  . Sinus bradycardia 03/19/2014  . Carotid artery disease 03/15/2014  . Carotid bruit   . Hypertension   . Hyperlipidemia   . CAD (coronary artery disease)   . Abnormal CXR   . Ejection fraction   . Tobacco abuse   . COPD (chronic obstructive pulmonary disease)     ROS   Patient denies fever, chills, headache, sweats, rash, change in vision, change in hearing, chest pain, cough, nausea or vomiting, urinary symptoms. All other systems are reviewed and are negative.  PHYSICAL EXAM  Patient is oriented to person time and place. Affect is normal. Head is atraumatic. There is no jugulovenous distention. Lungs are clear. Respiratory effort is nonlabored. Cardiac exam reveals S1 and S2. The abdomen is soft. There's no peripheral edema.  Filed Vitals:   06/25/14 1305  BP: 110/76  Pulse: 102  Height: 6' (1.829 m)  Weight: 191 lb 8 oz (86.864 kg)  SpO2: 95%     ASSESSMENT & PLAN

## 2014-06-25 NOTE — Patient Instructions (Signed)
Your physician recommends that you schedule a follow-up appointment in: 9 months. You will receive a reminder letter in the mail in about 6 months reminding you to call and schedule your appointment. If you don't receive this letter, please contact our office. Your physician recommends that you continue on your current medications as directed. Please refer to the Current Medication list given to you today.

## 2014-07-30 ENCOUNTER — Emergency Department (HOSPITAL_COMMUNITY)
Admission: EM | Admit: 2014-07-30 | Discharge: 2014-07-30 | Disposition: A | Discharge status: Home / Self Care | Payer: Medicare Other | Attending: Emergency Medicine | Admitting: Emergency Medicine

## 2014-07-30 ENCOUNTER — Encounter (HOSPITAL_COMMUNITY): Payer: Self-pay | Admitting: Emergency Medicine

## 2014-07-30 DIAGNOSIS — I2510 Atherosclerotic heart disease of native coronary artery without angina pectoris: Secondary | ICD-10-CM | POA: Insufficient documentation

## 2014-07-30 DIAGNOSIS — R000 Tachycardia, unspecified: Secondary | ICD-10-CM | POA: Insufficient documentation

## 2014-07-30 DIAGNOSIS — N200 Calculus of kidney: Secondary | ICD-10-CM | POA: Insufficient documentation

## 2014-07-30 DIAGNOSIS — R05 Cough: Secondary | ICD-10-CM | POA: Diagnosis not present

## 2014-07-30 DIAGNOSIS — J449 Chronic obstructive pulmonary disease, unspecified: Secondary | ICD-10-CM | POA: Diagnosis not present

## 2014-07-30 DIAGNOSIS — H9190 Unspecified hearing loss, unspecified ear: Secondary | ICD-10-CM | POA: Insufficient documentation

## 2014-07-30 DIAGNOSIS — Z9089 Acquired absence of other organs: Secondary | ICD-10-CM | POA: Insufficient documentation

## 2014-07-30 DIAGNOSIS — Z87891 Personal history of nicotine dependence: Secondary | ICD-10-CM | POA: Diagnosis not present

## 2014-07-30 DIAGNOSIS — M79662 Pain in left lower leg: Secondary | ICD-10-CM | POA: Diagnosis present

## 2014-07-30 DIAGNOSIS — Z9049 Acquired absence of other specified parts of digestive tract: Secondary | ICD-10-CM | POA: Insufficient documentation

## 2014-07-30 DIAGNOSIS — Z9889 Other specified postprocedural states: Secondary | ICD-10-CM | POA: Diagnosis not present

## 2014-07-30 DIAGNOSIS — Z87442 Personal history of urinary calculi: Secondary | ICD-10-CM | POA: Insufficient documentation

## 2014-07-30 DIAGNOSIS — Z7982 Long term (current) use of aspirin: Secondary | ICD-10-CM | POA: Diagnosis not present

## 2014-07-30 DIAGNOSIS — I10 Essential (primary) hypertension: Secondary | ICD-10-CM | POA: Insufficient documentation

## 2014-07-30 DIAGNOSIS — E785 Hyperlipidemia, unspecified: Secondary | ICD-10-CM | POA: Insufficient documentation

## 2014-07-30 NOTE — Discharge Instructions (Signed)
Follow-up tomorrow for the ultrasound. Return for shortness of breath or chest pain.

## 2014-07-30 NOTE — ED Notes (Signed)
Pt was walking today, moving something out of a house, pt had sharp pain in left leg and pain will not stop.

## 2014-07-30 NOTE — ED Provider Notes (Signed)
CSN: [id number]     Arrival date & time 07/30/14  1620 History   First MD Initiated Contact with Patient 07/30/14 2002     Chief Complaint  Patient presents with  . Leg Pain     (Consider location/radiation/quality/duration/timing/severity/associated sxs/prior Treatment) Patient is a 78 y.o. male presenting with leg pain. The history is provided by the patient.  Leg Pain Associated symptoms: no neck pain   patient's pain in his left calf. Began rather acutely earlier today while walking. States it felt as if there was a tearing pain. Is worse with walking and with palpitations. Worse with flexing at his calf. No other injury. No chest pain. He has had a cough somewhat recently but states she was treated for bronchitis. No fevers. No chest pain. No difficulty breathing.  Past Medical History  Diagnosis Date  . Hypertension   . Hyperlipidemia   . CAD (coronary artery disease) 2005    anterior MI with stent to the LAD in 1991 / stent 1996 / nuclear 2005, no ischemia  . Abnormal CXR 4/20011    Chest  CT mode/serere COPD ( no mediastinal abnormality)  . Ejection fraction     55-60%, echo, April, 2011, could not estimate RV pressure  . Tobacco abuse   . COPD (chronic obstructive pulmonary disease)   . Carotid bruit     Doppler, June, 2013, 0-39% bilateral  . Kidney stone   . Shortness of breath     with exertion  . HOH (hard of hearing)   . Sleep apnea     Stop Bang score of 5   Past Surgical History  Procedure Laterality Date  . Cholecystectomy      8 years ago [facility]  . Cyst removal hand      right wrist  . Colonoscopy  10/05/2012    Procedure: COLONOSCOPY;  Surgeon: Rogene Houston, MD;  Location: [facility];  Service: Endoscopy;  Laterality: N/A;  1030  . Appendectomy    . Cataract extraction Left   . Cardiac catheterization    . Tonsillectomy    . Skin cancer excision  2015    ear & neck  . Cataract extraction w/phaco Right 01/22/2014    Procedure: CATARACT EXTRACTION  PHACO AND INTRAOCULAR LENS PLACEMENT (IOC);  Surgeon: Tonny Branch, MD;  Location: [facility];  Service: Ophthalmology;  Laterality: Right;  CDE 7.46   No family history on file. History  Substance Use Topics  . Smoking status: Former Smoker -- 3.00 packs/day for 12 years    Types: Cigarettes    Quit date: 09/15/1995  . Smokeless tobacco: Current User    Types: Chew     Comment: chews 80% of a pack tobacco per day -- 10/12 doesnt chew too much  . Alcohol Use: No    Review of Systems  Constitutional: Negative for activity change and appetite change.  Respiratory: Positive for cough.   Cardiovascular: Negative for chest pain.  Gastrointestinal: Negative for anal bleeding.  Musculoskeletal: Negative for joint swelling, gait problem and neck pain.  Skin: Negative for pallor and wound.  Neurological: Negative for weakness and numbness.      Allergies  Review of patient's allergies indicates no known allergies.  Home Medications   Prior to Admission medications   Medication Sig Start Date End Date Taking? Authorizing Provider  aspirin 81 MG tablet Take 81 mg by mouth daily.    Historical Provider, MD  nitroGLYCERIN (NITROSTAT) 0.4 MG SL tablet Place 1 tablet (  0.4 mg total) under the tongue every 5 (five) minutes x 3 doses as needed. 06/25/14   Carlena Bjornstad, MD  simvastatin (ZOCOR) 40 MG tablet Take 1 tablet (40 mg total) by mouth every evening. 06/04/14   Carlena Bjornstad, MD   BP 127/75 mmHg  Pulse 105  Temp(Src) 99.1 F (37.3 C) (Oral)  Resp 18  Ht 6' (1.829 m)  Wt 192 lb (87.091 kg)  BMI 26.03 kg/m2  SpO2 98% Physical Exam  Constitutional: He is oriented to person, place, and time. He appears well-developed.  HENT:  Head: Normocephalic.  Eyes: Pupils are equal, round, and reactive to light.  Cardiovascular: Normal rate and regular rhythm.   Pulmonary/Chest: Effort normal.  Slightly harsh diffuse breath sounds  Abdominal: Soft. There is no tenderness.  Musculoskeletal:  Normal range of motion. He exhibits tenderness.  Tenderness to right calf medially. No deformity. Good flexion extension ankle. Sensation intact over foot. Dorsalis pedis pulse intact. Posterior tibial pulses also intact. Good capillary refill.  Neurological: He is alert and oriented to person, place, and time.  Skin: Skin is warm.    ED Course  Procedures (including critical care time) Labs Review Labs Reviewed - No data to display  Imaging Review No results found.   EKG Interpretation None      MDM   Final diagnoses:  Calf pain, left    Patient with calf pain. Likely strained muscle. DVT felt less likely, however we'll get Doppler tomorrow. Doubt pulmonary embolism. He does have a bronchitis history and has had a little bit of a cough. Slight tachycardia. Will not give anticoagulation pre-Doppler. Will discharge home.    Jasper Riling. Alvino Chapel, MD 07/30/14 2032

## 2014-07-31 ENCOUNTER — Ambulatory Visit (HOSPITAL_COMMUNITY)
Admission: RE | Admit: 2014-07-31 | Discharge: 2014-07-31 | Disposition: A | Discharge status: Home / Self Care | Payer: Medicare Other | Source: Ambulatory Visit | Attending: Emergency Medicine | Admitting: Emergency Medicine

## 2014-07-31 ENCOUNTER — Other Ambulatory Visit (HOSPITAL_COMMUNITY): Payer: Self-pay | Admitting: Emergency Medicine

## 2014-07-31 DIAGNOSIS — M79662 Pain in left lower leg: Secondary | ICD-10-CM | POA: Insufficient documentation

## 2014-07-31 NOTE — ED Provider Notes (Signed)
Pt here for next day US DVT study.  He had left LE pain Left LE DVT study negative He feels improved Stable for d/c home   Sharyon Cable, MD 07/31/14 1253

## 2014-08-30 ENCOUNTER — Other Ambulatory Visit: Payer: Self-pay | Admitting: Cardiology

## 2015-01-15 DIAGNOSIS — K1379 Other lesions of oral mucosa: Secondary | ICD-10-CM | POA: Diagnosis not present

## 2015-01-15 DIAGNOSIS — N200 Calculus of kidney: Secondary | ICD-10-CM | POA: Diagnosis not present

## 2015-01-23 DIAGNOSIS — K1120 Sialoadenitis, unspecified: Secondary | ICD-10-CM | POA: Diagnosis not present

## 2015-02-05 ENCOUNTER — Other Ambulatory Visit: Payer: Self-pay | Admitting: Otolaryngology

## 2015-02-05 DIAGNOSIS — K1120 Sialoadenitis, unspecified: Secondary | ICD-10-CM | POA: Diagnosis not present

## 2015-02-05 DIAGNOSIS — D1039 Benign neoplasm of other parts of mouth: Secondary | ICD-10-CM | POA: Diagnosis not present

## 2015-03-04 ENCOUNTER — Other Ambulatory Visit (HOSPITAL_COMMUNITY): Payer: Self-pay | Admitting: Family Medicine

## 2015-03-04 DIAGNOSIS — R109 Unspecified abdominal pain: Secondary | ICD-10-CM | POA: Diagnosis not present

## 2015-03-04 DIAGNOSIS — N200 Calculus of kidney: Secondary | ICD-10-CM

## 2015-03-06 ENCOUNTER — Ambulatory Visit (HOSPITAL_COMMUNITY)
Admission: RE | Admit: 2015-03-06 | Discharge: 2015-03-06 | Disposition: A | Discharge status: Home / Self Care | Payer: Medicare Other | Source: Ambulatory Visit | Attending: Family Medicine | Admitting: Family Medicine

## 2015-03-06 ENCOUNTER — Other Ambulatory Visit (HOSPITAL_COMMUNITY): Payer: Medicare Other

## 2015-03-06 DIAGNOSIS — K5730 Diverticulosis of large intestine without perforation or abscess without bleeding: Secondary | ICD-10-CM | POA: Diagnosis not present

## 2015-03-06 DIAGNOSIS — J449 Chronic obstructive pulmonary disease, unspecified: Secondary | ICD-10-CM | POA: Diagnosis not present

## 2015-03-06 DIAGNOSIS — Z87442 Personal history of urinary calculi: Secondary | ICD-10-CM | POA: Diagnosis not present

## 2015-03-06 DIAGNOSIS — N200 Calculus of kidney: Secondary | ICD-10-CM

## 2015-03-06 DIAGNOSIS — R109 Unspecified abdominal pain: Secondary | ICD-10-CM | POA: Diagnosis not present

## 2015-03-06 DIAGNOSIS — I723 Aneurysm of iliac artery: Secondary | ICD-10-CM | POA: Diagnosis not present

## 2015-03-06 DIAGNOSIS — E785 Hyperlipidemia, unspecified: Secondary | ICD-10-CM | POA: Insufficient documentation

## 2015-03-06 DIAGNOSIS — Z87891 Personal history of nicotine dependence: Secondary | ICD-10-CM | POA: Diagnosis not present

## 2015-03-06 DIAGNOSIS — I2510 Atherosclerotic heart disease of native coronary artery without angina pectoris: Secondary | ICD-10-CM | POA: Insufficient documentation

## 2015-04-10 ENCOUNTER — Other Ambulatory Visit: Payer: Self-pay | Admitting: Cardiology

## 2015-05-01 ENCOUNTER — Encounter: Payer: Self-pay | Admitting: Cardiology

## 2015-05-01 ENCOUNTER — Ambulatory Visit (INDEPENDENT_AMBULATORY_CARE_PROVIDER_SITE_OTHER): Payer: Medicare Other | Admitting: Cardiology

## 2015-05-01 VITALS — BP 102/70 | HR 70 | Ht 72.0 in | Wt 181.0 lb

## 2015-05-01 DIAGNOSIS — I10 Essential (primary) hypertension: Secondary | ICD-10-CM | POA: Diagnosis not present

## 2015-05-01 DIAGNOSIS — I2510 Atherosclerotic heart disease of native coronary artery without angina pectoris: Secondary | ICD-10-CM | POA: Diagnosis not present

## 2015-05-01 DIAGNOSIS — I779 Disorder of arteries and arterioles, unspecified: Secondary | ICD-10-CM | POA: Diagnosis not present

## 2015-05-01 DIAGNOSIS — I739 Peripheral vascular disease, unspecified: Secondary | ICD-10-CM

## 2015-05-01 DIAGNOSIS — E785 Hyperlipidemia, unspecified: Secondary | ICD-10-CM

## 2015-05-01 NOTE — Assessment & Plan Note (Signed)
The patient is taking the dose of statin that he can tolerate. No change in therapy.

## 2015-05-01 NOTE — Assessment & Plan Note (Signed)
The patient had mild carotid disease in the past. It is now been 2 years since his last study. We will arrange for a follow-up carotid Doppler.

## 2015-05-01 NOTE — Patient Instructions (Signed)

## 2015-05-01 NOTE — Progress Notes (Signed)
Cardiology Office Note   Date:  05/01/2015   ID:  [name], DOB [birth year], MRN [id number]  PCP:  Lanette Hampshire, MD  Cardiologist:  Dola Argyle, MD   Chief Complaint  Patient presents with  . Appointment    Follow-up coronary artery disease      History of Present Illness: Jeremy [name] is a 79 y.o. male who presents today to follow up coronary disease. He continues to work daily and he is very active. He has no significant symptoms. He is taking his medications appropriately.    Past Medical History  Diagnosis Date  . Hypertension   . Hyperlipidemia   . CAD (coronary artery disease) 2005    anterior MI with stent to the LAD in 1991 / stent 1996 / nuclear 2005, no ischemia  . Abnormal CXR 4/20011    Chest  CT mode/serere COPD ( no mediastinal abnormality)  . Ejection fraction     55-60%, echo, April, 2011, could not estimate RV pressure  . Tobacco abuse   . COPD (chronic obstructive pulmonary disease)   . Carotid bruit     Doppler, June, 2013, 0-39% bilateral  . Kidney stone   . Shortness of breath     with exertion  . HOH (hard of hearing)   . Sleep apnea     Stop Bang score of 5    Past Surgical History  Procedure Laterality Date  . Cholecystectomy      8 years ago [facility]  . Cyst removal hand      right wrist  . Colonoscopy  10/05/2012    Procedure: COLONOSCOPY;  Surgeon: Rogene Houston, MD;  Location: [facility];  Service: Endoscopy;  Laterality: N/A;  1030  . Appendectomy    . Cataract extraction Left   . Cardiac catheterization    . Tonsillectomy    . Skin cancer excision  2015    ear & neck  . Cataract extraction w/phaco Right 01/22/2014    Procedure: CATARACT EXTRACTION PHACO AND INTRAOCULAR LENS PLACEMENT (IOC);  Surgeon: Tonny Branch, MD;  Location: [facility];  Service: Ophthalmology;  Laterality: Right;  CDE 7.46    Patient Active Problem List   Diagnosis Date Noted  . Sinus bradycardia 03/19/2014  . Carotid artery disease 03/15/2014    . Hypertension   . Hyperlipidemia   . CAD (coronary artery disease)   . Abnormal CXR   . Ejection fraction   . Tobacco abuse   . COPD (chronic obstructive pulmonary disease)       Current Outpatient Prescriptions  Medication Sig Dispense Refill  . aspirin 81 MG tablet Take 81 mg by mouth daily.    . Ibuprofen (ADVIL PO) Take by mouth at bedtime.    . nitroGLYCERIN (NITROSTAT) 0.4 MG SL tablet Place 1 tablet (0.4 mg total) under the tongue every 5 (five) minutes x 3 doses as needed. 25 tablet 3  . simvastatin (ZOCOR) 40 MG tablet TAKE 1 TABLET BY MOUTH EVERY EVENING 30 tablet 11   No current facility-administered medications for this visit.    Allergies:   Review of patient's allergies indicates no known allergies.    Social History:  The patient  reports that he quit smoking about 19 years ago. His smoking use included Cigarettes. He has a 36 pack-year smoking history. His smokeless tobacco use includes Chew. He reports that he does not drink alcohol or use illicit drugs.   Family History:  The patient's family history includes  Heart disease in his brother, father, and mother.    ROS:  Please see the history of present illness.     Patient denies fever, chills, headache, sweats, rash, change in vision, change in hearing, chest pain, cough, nausea or vomiting, urinary symptoms. All other systems are reviewed and are negative.   PHYSICAL EXAM: VS:  BP 102/70 mmHg  Pulse 70  Ht 6' (1.829 m)  Wt 181 lb (82.101 kg)  BMI 24.54 kg/m2  SpO2 98% , Patient is oriented to person time and place. Affect is normal. He looks good. He is here with his wife. Head is atraumatic. Sclera and conjunctiva are normal. There is no jugulovenous distention. Lungs are clear. Respiratory effort is nonlabored. Cardiac exam reveals an S1 and S2. Abdomen is soft. There is no peripheral edema. There are no musculoskeletal deformities. There are no skin rashes. Neurologic is grossly intact.   EKG:   EKG is  done today and reviewed by me. There is no significant change from the past.   Recent Labs: No results found for requested labs within last 365 days.    Lipid Panel No results found for: CHOL, TRIG, HDL, CHOLHDL, VLDL, LDLCALC, LDLDIRECT    Wt Readings from Last 3 Encounters:  05/01/15 181 lb (82.101 kg)  07/30/14 192 lb (87.091 kg)  06/25/14 191 lb 8 oz (86.864 kg)      Current medicines are reviewed  The patient understands his medications.     ASSESSMENT AND PLAN:

## 2015-05-01 NOTE — Assessment & Plan Note (Signed)
The patient has had stents to the LAD in the past. His nuclear study in 2015 revealed an ejection fraction of 47% with no ischemia. He is stable and needs no further workup.

## 2015-05-15 ENCOUNTER — Ambulatory Visit (INDEPENDENT_AMBULATORY_CARE_PROVIDER_SITE_OTHER): Payer: Medicare Other

## 2015-05-15 DIAGNOSIS — I779 Disorder of arteries and arterioles, unspecified: Secondary | ICD-10-CM | POA: Diagnosis not present

## 2015-05-15 DIAGNOSIS — I739 Peripheral vascular disease, unspecified: Principal | ICD-10-CM

## 2015-05-21 ENCOUNTER — Encounter: Payer: Self-pay | Admitting: Gastroenterology

## 2015-05-21 ENCOUNTER — Ambulatory Visit (INDEPENDENT_AMBULATORY_CARE_PROVIDER_SITE_OTHER): Payer: Medicare Other | Admitting: Gastroenterology

## 2015-05-21 ENCOUNTER — Ambulatory Visit: Payer: Medicare Other | Admitting: Gastroenterology

## 2015-05-21 ENCOUNTER — Other Ambulatory Visit: Payer: Self-pay

## 2015-05-21 ENCOUNTER — Telehealth: Payer: Self-pay | Admitting: General Practice

## 2015-05-21 ENCOUNTER — Telehealth (INDEPENDENT_AMBULATORY_CARE_PROVIDER_SITE_OTHER): Payer: Self-pay | Admitting: Internal Medicine

## 2015-05-21 ENCOUNTER — Telehealth: Payer: Self-pay | Admitting: *Deleted

## 2015-05-21 VITALS — BP 126/75 | HR 78 | Temp 97.4°F | Ht 72.0 in | Wt 182.0 lb

## 2015-05-21 DIAGNOSIS — R1013 Epigastric pain: Secondary | ICD-10-CM

## 2015-05-21 DIAGNOSIS — R1310 Dysphagia, unspecified: Secondary | ICD-10-CM | POA: Insufficient documentation

## 2015-05-21 DIAGNOSIS — K625 Hemorrhage of anus and rectum: Secondary | ICD-10-CM

## 2015-05-21 DIAGNOSIS — I2510 Atherosclerotic heart disease of native coronary artery without angina pectoris: Secondary | ICD-10-CM | POA: Diagnosis not present

## 2015-05-21 DIAGNOSIS — R1314 Dysphagia, pharyngoesophageal phase: Secondary | ICD-10-CM | POA: Insufficient documentation

## 2015-05-21 MED ORDER — HYDROCORTISONE 2.5 % RE CREA: 1.0000 "application " | TOPICAL_CREAM | Freq: Two times a day (BID) | RECTAL | Status: DC

## 2015-05-21 MED ORDER — PANTOPRAZOLE SODIUM 40 MG PO TBEC: 40.0000 mg | DELAYED_RELEASE_TABLET | Freq: Every day | ORAL | Status: DC

## 2015-05-21 MED ORDER — ONDANSETRON HCL 4 MG PO TABS
4.0000 mg | ORAL_TABLET | Freq: Three times a day (TID) | ORAL | Status: DC | PRN
Start: 2015-05-21 — End: 2015-10-18

## 2015-05-21 NOTE — Telephone Encounter (Signed)
I received a call from Dr. Everette Rank office stating Jeremy [name] a patient of Dr. Laural Golden needs to be seen today.  Per RMR in Dr. Olevia Perches absence we will see the patient, however Dr. Laural Golden will stay his primary GI doctor.

## 2015-05-21 NOTE — Telephone Encounter (Signed)
Received call from Dr. Everette Rank this am. Patient had had heavy rectal bleeding this am. I advised Dr. Everette Rank patient should go to the ED for rectal bleeding. Dr. Laural Golden is not here. He stated he would call [facility].

## 2015-05-21 NOTE — Progress Notes (Signed)
Primary Care Physician:  Lanette Hampshire, MD Primary Gastroenterologist:  Dr. Laural Golden, seen urgently in clinic today due to request by PCP. Dr. Gala Romney to perform upcoming procedure  Chief Complaint  Patient presents with  . Rectal Bleeding    HPI:   Jeremy [name] is a 79 y.o. male presenting today at the request of  Dr. Everette Rank as an urgent evaluation. He is a patient of Dr. Laural Golden. Noted to have several days of rectal bleeding. Hgb 13.9 today through Dr. Everette Rank' office. Dr Laural Golden unavailable today, so our practice has agreed to see him.   Noted new onset of rectal bleeding 3-4 days ago. First episode "wasn't bad", second time "tough", this morning the worst. Has had 3-4 episodes of blood mixed in stool. Notes nausea. No abdominal pain. Has lower back discomfort, which has been going on for a few months. Bothered him so bad he woke up with back discomfort. Notes he has been working hard and straining. Mild rectal burning;  recently had rectal exam by Dr. Everette Rank. No diarrhea. 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 Has felt more weak but not dizzy. No pain with eating. Doesn't have much appetite. Has been taking Advil pm for sleep chronically but has stopped in the past 3-4 days. Still taking baby aspirin. Was told by Dr. Everette Rank to come off a baby aspirin.   States when eating, sometimes he will get choked. Meat is the worst culprit. If he doesn't chew well and take his time, he will regurgitate every bit of it. No prior dilation or EGD. Chronic GERD. Nausea for the past 2-3 days.   Last colonoscopy in 2014 by Dr. Laural Golden with multiple tubular adenomas, external hemorrhoids. Multiple sigmoid diverticula.   Past Medical History  Diagnosis Date  . Hypertension   . Hyperlipidemia   . CAD (coronary artery disease) 2005    anterior MI with stent to the LAD in 1991 / stent 1996 / nuclear 2005, no ischemia  . Abnormal CXR 4/20011    Chest   CT mode/serere COPD ( no mediastinal abnormality)  . Ejection fraction     55-60%, echo, April, 2011, could not estimate RV pressure  . Tobacco abuse   . COPD (chronic obstructive pulmonary disease)   . Carotid bruit     Doppler, June, 2013, 0-39% bilateral  . Kidney stone   . Shortness of breath     with exertion  . HOH (hard of hearing)   . Sleep apnea     Stop Bang score of 5    Past Surgical History  Procedure Laterality Date  . Cholecystectomy      8 years ago [facility]  . Cyst removal hand      right wrist  . Colonoscopy  10/05/2012    Dr. Laural Golden: multiple diverticula at sigmoid colon, multiple polyps (tubular adenomas), small external hemorrhoids  . Appendectomy    . Cataract extraction Left   . Cardiac catheterization    . Tonsillectomy    . Skin cancer excision  2015    ear & neck  . Cataract extraction w/phaco Right 01/22/2014    Procedure: CATARACT EXTRACTION PHACO AND INTRAOCULAR LENS PLACEMENT (IOC);  Surgeon: Tonny Branch, MD;  Location: [facility];  Service: Ophthalmology;  Laterality: Right;  CDE 7.46    Current Outpatient Prescriptions  Medication Sig Dispense Refill  . aspirin 81 MG tablet Take 81 mg by mouth daily.    Marland Kitchen  nitroGLYCERIN (NITROSTAT) 0.4 MG SL tablet Place 1 tablet (0.4 mg total) under the tongue every 5 (five) minutes x 3 doses as needed. 25 tablet 3  . simvastatin (ZOCOR) 40 MG tablet TAKE 1 TABLET BY MOUTH EVERY EVENING 30 tablet 11  . hydrocortisone (ANUSOL-HC) 2.5 % rectal cream Place 1 application rectally 2 (two) times daily. 30 g 1  . ondansetron (ZOFRAN) 4 MG tablet Take 1 tablet (4 mg total) by mouth every 8 (eight) hours as needed for nausea or vomiting. 30 tablet 1  . pantoprazole (PROTONIX) 40 MG tablet Take 1 tablet (40 mg total) by mouth daily. 30 minutes before breakfast. 90 tablet 3   No current facility-administered medications for this visit.    Allergies as of 05/21/2015  . (No Known Allergies)    Family History  Problem Relation  Age of Onset  . Heart disease Mother   . Heart disease Father   . Heart disease Brother   . Colon cancer Neg Hx   . Stomach cancer Brother     Social History   Social History  . Marital Status: Married    Spouse Name: N/A  . Number of Children: N/A  . Years of Education: N/A   Occupational History  . Not on file.   Social History Main Topics  . Smoking status: Former Smoker -- 3.00 packs/day for 12 years    Types: Cigarettes    Quit date: 09/15/1995  . Smokeless tobacco: Current User    Types: Chew     Comment: chews 80% of a pack tobacco per day -- 10/12 doesnt chew too much  . Alcohol Use: No  . Drug Use: No  . Sexual Activity: Not on file   Other Topics Concern  . Not on file   Social History Narrative    Review of Systems: Gen: see HPI CV: Denies chest pain, heart palpitations, peripheral edema, syncope.  Resp: +DOE GI: see HPI GU : Denies urinary burning, urinary frequency, urinary hesitancy MS: Denies joint pain, muscle weakness, cramps, or limitation of movement.  Derm: Denies rash, itching, dry skin Psych: Denies depression, anxiety, memory loss, and confusion Heme: see HPI.  Physical Exam: BP 126/75 mmHg  Pulse 78  Temp(Src) 97.4 F (36.3 C) (Oral)  Ht 6' (1.829 m)  Wt 182 lb (82.555 kg)  BMI 24.68 kg/m2 General:   Alert and oriented. Pleasant and cooperative. Well-nourished and well-developed.  Head:  Normocephalic and atraumatic. Eyes:  Without icterus, sclera clear and conjunctiva pink.  Ears:  Normal auditory acuity. Nose:  No deformity, discharge,  or lesions. Mouth:  No deformity or lesions, oral mucosa pink.  Lungs:  Clear to auscultation bilaterally. No wheezes, rales, or rhonchi. No distress.  Heart:  S1, S2 present without murmurs appreciated.  Abdomen:  +BS, soft, mild discomfort epigastric and non-distended. No HSM noted. No guarding or rebound. No masses appreciated.  Rectal:  No obvious fissure, no thrombosed hemorrhoids, query  anterior internal hemorrhoid. Scant amount of burgundy/red blood on gloved finger.  Msk:  Symmetrical without gross deformities. Normal posture.  Extremities:  Without  edema. Neurologic:  Alert and  oriented x4;  grossly normal neurologically. Psych:  Alert and cooperative. Normal mood and affect.  Hgb today through Dr. Everette Rank' office: 87.9

## 2015-05-21 NOTE — Telephone Encounter (Signed)
-----   Message from [place], LPN sent at 03/22/382  7:12 AM EDT -----   ----- Message -----    From: Carlena Bjornstad, MD    Sent: 05/19/2015   6:16 PM      To: Deliah [place] Via, LPN  Please notify patient that doppler looks quite stable.

## 2015-05-21 NOTE — Telephone Encounter (Signed)
Patient informed. 

## 2015-05-21 NOTE — Telephone Encounter (Signed)
error 

## 2015-05-21 NOTE — Patient Instructions (Signed)
For rectal bleeding: start using anusol cream per rectum twice a day for 7 days. IF YOU HAVE SEVERE RECTAL BLEEDING, severe abdominal pain, or other symptoms, go to the ED. For now, your blood counts are stable, and it may be able to be treated with supportive measures only.   STOP TAKING ADVIL PM as you have done.  Start taking Protonix once each morning, 30 minutes before breakfast. I have also provided nausea medication to take every 8 hours as needed.  We have scheduled you for an upper endoscopy with dilation with Dr. Gala Romney.

## 2015-05-22 ENCOUNTER — Encounter: Payer: Self-pay | Admitting: Gastroenterology

## 2015-05-22 NOTE — Assessment & Plan Note (Addendum)
79 year old male seen urgently in office, patient of Dr. Laural Golden, for new onset moderate volume rectal bleeding. Hgb 13.9 today. Does not appear to be hemodynamically significant. Differentials broad to include benign anorectal source, stuttering diverticular bleed, NSAID-induced injury, doubt ischemic colitis. Doubt rapid transit upper GI bleed. Last colonoscopy 2014 with multiple adenomas, diverticula, external hemorrhoids. Discussed in detail with Dr. Gala Romney. As Hgb is stable and colonoscopy fairly up-to-date, will monitor closely for now and provide supportive treatment. Anusol cream sent to pharmacy. Patient counseled on signs/symptoms that would necessitate urgent evaluation. Hold on colonoscopy unless bleeding persists/worsens, or evidence of acute blood loss anemia.

## 2015-05-22 NOTE — Assessment & Plan Note (Signed)
New onset dyspepsia to include nausea, reflux, in setting of chronic Advil pm. Associated dysphagia to solid foods. Recommend EGD with dilation in near future, start Protonix, and add Zofran for supportive measures. Counseled to avoid all NSAIDs.   Proceed with upper endoscopy/dilation in the near future with Dr. Gala Romney. The risks, benefits, and alternatives have been discussed in detail with patient. They have stated understanding and desire to proceed.  Protonix sent to pharmacy Zofran sent to pharmacy

## 2015-05-22 NOTE — Assessment & Plan Note (Signed)
Likely secondary to uncontrolled GERD, unable to rule out web, ring, stricture. EGD/dil as planned.

## 2015-05-23 ENCOUNTER — Ambulatory Visit (HOSPITAL_COMMUNITY)
Admission: RE | Admit: 2015-05-23 | Discharge: 2015-05-23 | Disposition: A | Discharge status: Home / Self Care | Payer: Medicare Other | Source: Ambulatory Visit | Attending: Internal Medicine | Admitting: Internal Medicine

## 2015-05-23 ENCOUNTER — Encounter (HOSPITAL_COMMUNITY): Payer: Self-pay

## 2015-05-23 ENCOUNTER — Encounter (HOSPITAL_COMMUNITY)
Admission: RE | Disposition: A | Discharge status: Home / Self Care | Payer: Self-pay | Source: Ambulatory Visit | Attending: Internal Medicine

## 2015-05-23 DIAGNOSIS — K2950 Unspecified chronic gastritis without bleeding: Secondary | ICD-10-CM | POA: Diagnosis not present

## 2015-05-23 DIAGNOSIS — K2100 Gastro-esophageal reflux disease with esophagitis, without bleeding: Secondary | ICD-10-CM | POA: Insufficient documentation

## 2015-05-23 DIAGNOSIS — R001 Bradycardia, unspecified: Secondary | ICD-10-CM | POA: Insufficient documentation

## 2015-05-23 DIAGNOSIS — K449 Diaphragmatic hernia without obstruction or gangrene: Secondary | ICD-10-CM | POA: Diagnosis not present

## 2015-05-23 DIAGNOSIS — Z87442 Personal history of urinary calculi: Secondary | ICD-10-CM | POA: Insufficient documentation

## 2015-05-23 DIAGNOSIS — K222 Esophageal obstruction: Secondary | ICD-10-CM | POA: Diagnosis not present

## 2015-05-23 DIAGNOSIS — K644 Residual hemorrhoidal skin tags: Secondary | ICD-10-CM | POA: Diagnosis not present

## 2015-05-23 DIAGNOSIS — E785 Hyperlipidemia, unspecified: Secondary | ICD-10-CM | POA: Diagnosis not present

## 2015-05-23 DIAGNOSIS — G4730 Sleep apnea, unspecified: Secondary | ICD-10-CM | POA: Insufficient documentation

## 2015-05-23 DIAGNOSIS — K219 Gastro-esophageal reflux disease without esophagitis: Secondary | ICD-10-CM | POA: Diagnosis present

## 2015-05-23 DIAGNOSIS — I2510 Atherosclerotic heart disease of native coronary artery without angina pectoris: Secondary | ICD-10-CM | POA: Insufficient documentation

## 2015-05-23 DIAGNOSIS — Z9841 Cataract extraction status, right eye: Secondary | ICD-10-CM | POA: Insufficient documentation

## 2015-05-23 DIAGNOSIS — A048 Other specified bacterial intestinal infections: Secondary | ICD-10-CM | POA: Diagnosis not present

## 2015-05-23 DIAGNOSIS — K625 Hemorrhage of anus and rectum: Secondary | ICD-10-CM | POA: Insufficient documentation

## 2015-05-23 DIAGNOSIS — K5730 Diverticulosis of large intestine without perforation or abscess without bleeding: Secondary | ICD-10-CM | POA: Diagnosis not present

## 2015-05-23 DIAGNOSIS — B9681 Helicobacter pylori [H. pylori] as the cause of diseases classified elsewhere: Secondary | ICD-10-CM | POA: Diagnosis not present

## 2015-05-23 DIAGNOSIS — Z87891 Personal history of nicotine dependence: Secondary | ICD-10-CM | POA: Insufficient documentation

## 2015-05-23 DIAGNOSIS — I10 Essential (primary) hypertension: Secondary | ICD-10-CM | POA: Insufficient documentation

## 2015-05-23 DIAGNOSIS — Z7982 Long term (current) use of aspirin: Secondary | ICD-10-CM | POA: Diagnosis not present

## 2015-05-23 DIAGNOSIS — R1013 Epigastric pain: Secondary | ICD-10-CM

## 2015-05-23 DIAGNOSIS — K3189 Other diseases of stomach and duodenum: Secondary | ICD-10-CM | POA: Diagnosis not present

## 2015-05-23 DIAGNOSIS — Z961 Presence of intraocular lens: Secondary | ICD-10-CM | POA: Diagnosis not present

## 2015-05-23 DIAGNOSIS — J449 Chronic obstructive pulmonary disease, unspecified: Secondary | ICD-10-CM | POA: Insufficient documentation

## 2015-05-23 DIAGNOSIS — Z86010 Personal history of colonic polyps: Secondary | ICD-10-CM | POA: Insufficient documentation

## 2015-05-23 DIAGNOSIS — R0989 Other specified symptoms and signs involving the circulatory and respiratory systems: Secondary | ICD-10-CM | POA: Insufficient documentation

## 2015-05-23 DIAGNOSIS — R1314 Dysphagia, pharyngoesophageal phase: Secondary | ICD-10-CM

## 2015-05-23 DIAGNOSIS — I252 Old myocardial infarction: Secondary | ICD-10-CM | POA: Diagnosis not present

## 2015-05-23 HISTORY — PX: ESOPHAGEAL DILATION: SHX303

## 2015-05-23 HISTORY — PX: ESOPHAGOGASTRODUODENOSCOPY: SHX5428

## 2015-05-23 SURGERY — EGD (ESOPHAGOGASTRODUODENOSCOPY)
Anesthesia: Moderate Sedation

## 2015-05-23 MED ORDER — MIDAZOLAM HCL 5 MG/5ML IJ SOLN
INTRAMUSCULAR | Status: DC | PRN
  Administered 2015-05-23: 2 mg via INTRAVENOUS
  Administered 2015-05-23 (×2): 1 mg via INTRAVENOUS

## 2015-05-23 MED ORDER — LIDOCAINE VISCOUS 2 % MT SOLN
OROMUCOSAL | Status: DC | PRN
  Administered 2015-05-23: 3 mL via OROMUCOSAL

## 2015-05-23 MED ORDER — LIDOCAINE VISCOUS 2 % MT SOLN
OROMUCOSAL | Status: AC
  Filled 2015-05-23: qty 15

## 2015-05-23 MED ORDER — ONDANSETRON HCL 4 MG/2ML IJ SOLN
INTRAMUSCULAR | Status: AC
  Filled 2015-05-23: qty 2

## 2015-05-23 MED ORDER — MEPERIDINE HCL 100 MG/ML IJ SOLN
INTRAMUSCULAR | Status: AC
  Filled 2015-05-23: qty 2

## 2015-05-23 MED ORDER — ONDANSETRON HCL 4 MG/2ML IJ SOLN
INTRAMUSCULAR | Status: DC | PRN
  Administered 2015-05-23: 4 mg via INTRAVENOUS

## 2015-05-23 MED ORDER — SODIUM CHLORIDE 0.9 % IV SOLN
INTRAVENOUS | Status: DC
  Administered 2015-05-23: 14:00:00 via INTRAVENOUS

## 2015-05-23 MED ORDER — STERILE WATER FOR IRRIGATION IR SOLN
Status: DC | PRN
  Administered 2015-05-23: 14:00:00

## 2015-05-23 MED ORDER — MEPERIDINE HCL 100 MG/ML IJ SOLN
INTRAMUSCULAR | Status: DC | PRN
  Administered 2015-05-23 (×2): 25 mg via INTRAVENOUS

## 2015-05-23 MED ORDER — MIDAZOLAM HCL 5 MG/5ML IJ SOLN
INTRAMUSCULAR | Status: AC
  Filled 2015-05-23: qty 10

## 2015-05-23 NOTE — H&P (View-Only) (Signed)
Primary Care Physician:  Lanette Hampshire, MD Primary Gastroenterologist:  Dr. Laural Golden, seen urgently in clinic today due to request by PCP. Dr. Gala Romney to perform upcoming procedure  Chief Complaint  Patient presents with  . Rectal Bleeding    HPI:   Jeremy [name] is a 79 y.o. male presenting today at the request of  Dr. Everette Rank as an urgent evaluation. He is a patient of Dr. Laural Golden. Noted to have several days of rectal bleeding. Hgb 13.9 today through Dr. Everette Rank' office. Dr Laural Golden unavailable today, so our practice has agreed to see him.   Noted new onset of rectal bleeding 3-4 days ago. First episode "wasn't bad", second time "tough", this morning the worst. Has had 3-4 episodes of blood mixed in stool. Notes nausea. No abdominal pain. Has lower back discomfort, which has been going on for a few months. Bothered him so bad he woke up with back discomfort. Notes he has been working hard and straining. Mild rectal burning;  recently had rectal exam by Dr. Everette Rank. No diarrhea. 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 Has felt more weak but not dizzy. No pain with eating. Doesn't have much appetite. Has been taking Advil pm for sleep chronically but has stopped in the past 3-4 days. Still taking baby aspirin. Was told by Dr. Everette Rank to come off a baby aspirin.   States when eating, sometimes he will get choked. Meat is the worst culprit. If he doesn't chew well and take his time, he will regurgitate every bit of it. No prior dilation or EGD. Chronic GERD. Nausea for the past 2-3 days.   Last colonoscopy in 2014 by Dr. Laural Golden with multiple tubular adenomas, external hemorrhoids. Multiple sigmoid diverticula.   Past Medical History  Diagnosis Date  . Hypertension   . Hyperlipidemia   . CAD (coronary artery disease) 2005    anterior MI with stent to the LAD in 1991 / stent 1996 / nuclear 2005, no ischemia  . Abnormal CXR 4/20011    Chest   CT mode/serere COPD ( no mediastinal abnormality)  . Ejection fraction     55-60%, echo, April, 2011, could not estimate RV pressure  . Tobacco abuse   . COPD (chronic obstructive pulmonary disease)   . Carotid bruit     Doppler, June, 2013, 0-39% bilateral  . Kidney stone   . Shortness of breath     with exertion  . HOH (hard of hearing)   . Sleep apnea     Stop Bang score of 5    Past Surgical History  Procedure Laterality Date  . Cholecystectomy      8 years ago [facility]  . Cyst removal hand      right wrist  . Colonoscopy  10/05/2012    Dr. Laural Golden: multiple diverticula at sigmoid colon, multiple polyps (tubular adenomas), small external hemorrhoids  . Appendectomy    . Cataract extraction Left   . Cardiac catheterization    . Tonsillectomy    . Skin cancer excision  2015    ear & neck  . Cataract extraction w/phaco Right 01/22/2014    Procedure: CATARACT EXTRACTION PHACO AND INTRAOCULAR LENS PLACEMENT (IOC);  Surgeon: Tonny Branch, MD;  Location: [facility];  Service: Ophthalmology;  Laterality: Right;  CDE 7.46    Current Outpatient Prescriptions  Medication Sig Dispense Refill  . aspirin 81 MG tablet Take 81 mg by mouth daily.    Marland Kitchen  nitroGLYCERIN (NITROSTAT) 0.4 MG SL tablet Place 1 tablet (0.4 mg total) under the tongue every 5 (five) minutes x 3 doses as needed. 25 tablet 3  . simvastatin (ZOCOR) 40 MG tablet TAKE 1 TABLET BY MOUTH EVERY EVENING 30 tablet 11  . hydrocortisone (ANUSOL-HC) 2.5 % rectal cream Place 1 application rectally 2 (two) times daily. 30 g 1  . ondansetron (ZOFRAN) 4 MG tablet Take 1 tablet (4 mg total) by mouth every 8 (eight) hours as needed for nausea or vomiting. 30 tablet 1  . pantoprazole (PROTONIX) 40 MG tablet Take 1 tablet (40 mg total) by mouth daily. 30 minutes before breakfast. 90 tablet 3   No current facility-administered medications for this visit.    Allergies as of 05/21/2015  . (No Known Allergies)    Family History  Problem Relation  Age of Onset  . Heart disease Mother   . Heart disease Father   . Heart disease Brother   . Colon cancer Neg Hx   . Stomach cancer Brother     Social History   Social History  . Marital Status: Married    Spouse Name: N/A  . Number of Children: N/A  . Years of Education: N/A   Occupational History  . Not on file.   Social History Main Topics  . Smoking status: Former Smoker -- 3.00 packs/day for 12 years    Types: Cigarettes    Quit date: 09/15/1995  . Smokeless tobacco: Current User    Types: Chew     Comment: chews 80% of a pack tobacco per day -- 10/12 doesnt chew too much  . Alcohol Use: No  . Drug Use: No  . Sexual Activity: Not on file   Other Topics Concern  . Not on file   Social History Narrative    Review of Systems: Gen: see HPI CV: Denies chest pain, heart palpitations, peripheral edema, syncope.  Resp: +DOE GI: see HPI GU : Denies urinary burning, urinary frequency, urinary hesitancy MS: Denies joint pain, muscle weakness, cramps, or limitation of movement.  Derm: Denies rash, itching, dry skin Psych: Denies depression, anxiety, memory loss, and confusion Heme: see HPI.  Physical Exam: BP 126/75 mmHg  Pulse 78  Temp(Src) 97.4 F (36.3 C) (Oral)  Ht 6' (1.829 m)  Wt 182 lb (82.555 kg)  BMI 24.68 kg/m2 General:   Alert and oriented. Pleasant and cooperative. Well-nourished and well-developed.  Head:  Normocephalic and atraumatic. Eyes:  Without icterus, sclera clear and conjunctiva pink.  Ears:  Normal auditory acuity. Nose:  No deformity, discharge,  or lesions. Mouth:  No deformity or lesions, oral mucosa pink.  Lungs:  Clear to auscultation bilaterally. No wheezes, rales, or rhonchi. No distress.  Heart:  S1, S2 present without murmurs appreciated.  Abdomen:  +BS, soft, mild discomfort epigastric and non-distended. No HSM noted. No guarding or rebound. No masses appreciated.  Rectal:  No obvious fissure, no thrombosed hemorrhoids, query  anterior internal hemorrhoid. Scant amount of burgundy/red blood on gloved finger.  Msk:  Symmetrical without gross deformities. Normal posture.  Extremities:  Without  edema. Neurologic:  Alert and  oriented x4;  grossly normal neurologically. Psych:  Alert and cooperative. Normal mood and affect.  Hgb today through Dr. Everette Rank' office: 38.9

## 2015-05-23 NOTE — Discharge Instructions (Signed)
EGD Discharge instructions Please read the instructions outlined below and refer to this sheet in the next few weeks. These discharge instructions provide you with general information on caring for yourself after you leave the hospital. Your doctor may also give you specific instructions. While your treatment has been planned according to the most current medical practices available, unavoidable complications occasionally occur. If you have any problems or questions after discharge, please call your doctor. ACTIVITY  You may resume your regular activity but move at a slower pace for the next 24 hours.   Take frequent rest periods for the next 24 hours.   Walking will help expel (get rid of) the air and reduce the bloated feeling in your abdomen.   No driving for 24 hours (because of the anesthesia (medicine) used during the test).   You may shower.   Do not sign any important legal documents or operate any machinery for 24 hours (because of the anesthesia used during the test).  NUTRITION  Drink plenty of fluids.   You may resume your normal diet.   Begin with a light meal and progress to your normal diet.   Avoid alcoholic beverages for 24 hours or as instructed by your caregiver.  MEDICATIONS  You may resume your normal medications unless your caregiver tells you otherwise.  WHAT YOU CAN EXPECT TODAY  You may experience abdominal discomfort such as a feeling of fullness or gas pains.  FOLLOW-UP  Your doctor will discuss the results of your test with you.  SEEK IMMEDIATE MEDICAL ATTENTION IF ANY OF THE FOLLOWING OCCUR:  Excessive nausea (feeling sick to your stomach) and/or vomiting.   Severe abdominal pain and distention (swelling).   Trouble swallowing.   Temperature over 101 F (37.8 C).  Rectal bleeding or vomiting of blood.     GERD information provided  Protonix 40 mg twice daily  Chloraseptic spray for any transient sore throat you may  experience  Further recommendations to follow pending review of pathology report  Stop using tobacco products  Office visit with [place] in 3 months   *****APPOINTMENT December 1st, 2016 at 8am.       Gastroesophageal Reflux Disease, Adult Gastroesophageal reflux disease (GERD) happens when acid from your stomach flows up into the esophagus. When acid comes in contact with the esophagus, the acid causes soreness (inflammation) in the esophagus. Over time, GERD may create small holes (ulcers) in the lining of the esophagus. CAUSES   Increased body weight. This puts pressure on the stomach, making acid rise from the stomach into the esophagus.  Smoking. This increases acid production in the stomach.  Drinking alcohol. This causes decreased pressure in the lower esophageal sphincter (valve or ring of muscle between the esophagus and stomach), allowing acid from the stomach into the esophagus.  Late evening meals and a full stomach. This increases pressure and acid production in the stomach.  A malformed lower esophageal sphincter. Sometimes, no cause is found. SYMPTOMS   Burning pain in the lower part of the mid-chest behind the breastbone and in the mid-stomach area. This may occur twice a week or more often.  Trouble swallowing.  Sore throat.  Dry cough.  Asthma-like symptoms including chest tightness, shortness of breath, or wheezing. DIAGNOSIS  Your caregiver may be able to diagnose GERD based on your symptoms. In some cases, X-rays and other tests may be done to check for complications or to check the condition of your stomach and esophagus. TREATMENT  Your caregiver may  recommend over-the-counter or prescription medicines to help decrease acid production. Ask your caregiver before starting or adding any new medicines.  HOME CARE INSTRUCTIONS   Change the factors that you can control. Ask your caregiver for guidance concerning weight loss, quitting smoking, and alcohol  consumption.  Avoid foods and drinks that make your symptoms worse, such as:  Caffeine or alcoholic drinks.  Chocolate.  Peppermint or mint flavorings.  Garlic and onions.  Spicy foods.  Citrus fruits, such as oranges, lemons, or limes.  Tomato-based foods such as sauce, chili, salsa, and pizza.  Fried and fatty foods.  Avoid lying down for the 3 hours prior to your bedtime or prior to taking a nap.  Eat small, frequent meals instead of large meals.  Wear loose-fitting clothing. Do not wear anything tight around your waist that causes pressure on your stomach.  Raise the head of your bed 6 to 8 inches with wood blocks to help you sleep. Extra pillows will not help.  Only take over-the-counter or prescription medicines for pain, discomfort, or fever as directed by your caregiver.  Do not take aspirin, ibuprofen, or other nonsteroidal anti-inflammatory drugs (NSAIDs). SEEK IMMEDIATE MEDICAL CARE IF:   You have pain in your arms, neck, jaw, teeth, or back.  Your pain increases or changes in intensity or duration.  You develop nausea, vomiting, or sweating (diaphoresis).  You develop shortness of breath, or you faint.  Your vomit is green, yellow, black, or looks like coffee grounds or blood.  Your stool is red, bloody, or black. These symptoms could be signs of other problems, such as heart disease, gastric bleeding, or esophageal bleeding. MAKE SURE YOU:   Understand these instructions.  Will watch your condition.  Will get help right away if you are not doing well or get worse. Document Released: 06/10/2005 Document Revised: 11/23/2011 Document Reviewed: 03/20/2011 [facility] Patient Information [address], [place]. This information is not intended to replace advice given to you by your health care provider. Make sure you discuss any questions you have with your health care provider.

## 2015-05-23 NOTE — Progress Notes (Signed)
cc'ed to pcp °

## 2015-05-23 NOTE — Op Note (Signed)
[facility] [address] [place], [address]   ENDOSCOPY PROCEDURE REPORT  PATIENT: Jeremy [name], Jeremy [name]  MR#: [id number] BIRTHDATE: [birth year] , 79  yrs. old GENDER: male ENDOSCOPIST: R.  Garfield Cornea, MD FACP FACG REFERRED BY:  Marjean Donna, M.D. PROCEDURE DATE:  06-10-2015 PROCEDURE:  EGD w/ gastric biopsy and Maloney dilation of esophagus  INDICATIONS:  Long-standing GERD; esophageal dysphagia. MEDICATIONS: Versed 4 mg IV and Demerol 50 mg IV in divided doses. Zofran 4 mg IV.  Xylocaine gel orally. ASA CLASS:      Class II  CONSENT: The risks, benefits, limitations, alternatives and imponderables have been discussed.  The potential for biopsy, esophogeal dilation, etc. have also been reviewed.  Questions have been answered.  All parties agreeable.  Please see the history and physical in the medical record for more information.  DESCRIPTION OF PROCEDURE: After the risks benefits and alternatives of the procedure were thoroughly explained, informed consent was obtained.  The EG-[id number] ([id number]) endoscope was introduced through the mouth and advanced to the second portion of the duodenum , limited by Without limitations. The instrument was slowly withdrawn as the mucosa was fully examined. Estimated blood loss is zero unless otherwise noted in this procedure report.    Patient had a benign-appearing peptic stricture at the GE junction. Overlying erosions and superficial ulcerration along with linear erosions extending 5-7 cm up into the distal esophagus.  No nodularity.  No Barrett's esophagus seen.  EG junction easily traversed with the scope.  When the scope did traverse the GE junction, the stricture was partially dilated with minimal bleeding.  Stomach empty.  Gastric mucosa had somewhat of a mosaic appearance.  No ulcer or infiltrating process.  2 cm hiatal hernia. Patent pylorus.  Normal-appearing first and second portion of the duodenum.  Retroflexed  views revealed a hiatal hernia.  Scope was withdrawn and a 52 [place] Maloney dilator was passed to full insertion with mild resistance.   A look back revealed the stricture had been dilated additionally with minimal bleeding and without apparent complication. Finally, biopsies of the abnormal appearing gastric mucosa were taken for histologic study.  The scope was then withdrawn from the patient and the procedure completed.  COMPLICATIONS: There were no immediate complications. 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 Status post dilation as described. Hiatal hernia. Abnormal gastric mucosa of uncertain significance?"status post gastric biopsy  RECOMMENDATIONS: Protonix 40 mg twice daily. Follow up on pathology.  Patient should stop using tobacco products. Further recommendations to follow.  REPEAT EXAM:  eSigned:  R. Garfield Cornea, MD [facility] 06-10-2015 2:58 PM    CC:  CPT CODES: ICD CODES:  The ICD and CPT codes recommended by this software are interpretations from the data that the clinical staff has captured with the software.  The verification of the translation of this report to the ICD and CPT codes and modifiers is the sole responsibility of the health care institution and practicing physician where this report was generated.  [employer]. will not be held responsible for the validity of the ICD and CPT codes included on this report.  AMA assumes no liability for data contained or not contained herein. CPT is a Designer, television/film set of the [employer].  PATIENT NAME:  Jeremy [name], Jeremy [name] MR#: [id number]

## 2015-05-23 NOTE — Interval H&P Note (Signed)
History and Physical Interval Note:  05/23/2015 2:18 PM  Jeremy [name]  has presented today for surgery, with the diagnosis of DYSPEPSIA/DYSPHAGIA  The various methods of treatment have been discussed with the patient and family. After consideration of risks, benefits and other options for treatment, the patient has consented to  Procedure(s) with comments: ESOPHAGOGASTRODUODENOSCOPY (EGD) (N/A) - 230  ESOPHAGEAL DILATION (N/A) as a surgical intervention .  The patient's history has been reviewed, patient examined, no change in status, stable for surgery.  I have reviewed the patient's chart and labs.  Questions were answered to the patient's satisfaction.     Robert Rourk  No change. EGD with EGD, etc. as appropriate/feasible..  The risks, benefits, limitations, alternatives and imponderables have been reviewed with the patient. Potential for esophageal dilation, biopsy, etc. have also been reviewed.  Questions have been answered. All parties agreeable.

## 2015-05-27 ENCOUNTER — Encounter: Payer: Self-pay | Admitting: Internal Medicine

## 2015-05-28 ENCOUNTER — Encounter (HOSPITAL_COMMUNITY): Payer: Self-pay | Admitting: Internal Medicine

## 2015-05-29 ENCOUNTER — Telehealth: Payer: Self-pay

## 2015-05-29 NOTE — Telephone Encounter (Signed)
Per RMR-  Send letter to patient.  Send copy of letter with path to referring provider and PCP.   Need prevpak or generic equivalent x 14 days ; hold protonix and zocor during treatment and then resume

## 2015-06-03 NOTE — Telephone Encounter (Signed)
Tried to call pt- NA 

## 2015-06-04 ENCOUNTER — Other Ambulatory Visit: Payer: Self-pay | Admitting: Internal Medicine

## 2015-06-04 MED ORDER — AMOXICILL-CLARITHRO-LANSOPRAZ PO MISC: Freq: Two times a day (BID) | ORAL | Status: DC

## 2015-06-04 NOTE — Telephone Encounter (Signed)
Pt is aware. rx sent to the pharmacy. pts wife is aware that he needs to stop his protonix and zocor while taking abx and then restart after finishing abx.

## 2015-08-05 DIAGNOSIS — Z23 Encounter for immunization: Secondary | ICD-10-CM | POA: Diagnosis not present

## 2015-08-15 ENCOUNTER — Encounter: Payer: Self-pay | Admitting: Gastroenterology

## 2015-08-15 ENCOUNTER — Ambulatory Visit (INDEPENDENT_AMBULATORY_CARE_PROVIDER_SITE_OTHER): Payer: Medicare Other | Admitting: Gastroenterology

## 2015-08-15 ENCOUNTER — Telehealth: Payer: Self-pay | Admitting: Gastroenterology

## 2015-08-15 VITALS — BP 142/81 | HR 66 | Temp 97.8°F | Ht 67.0 in | Wt 182.0 lb

## 2015-08-15 DIAGNOSIS — B9681 Helicobacter pylori [H. pylori] as the cause of diseases classified elsewhere: Secondary | ICD-10-CM | POA: Diagnosis not present

## 2015-08-15 DIAGNOSIS — I2510 Atherosclerotic heart disease of native coronary artery without angina pectoris: Secondary | ICD-10-CM | POA: Diagnosis not present

## 2015-08-15 DIAGNOSIS — A048 Other specified bacterial intestinal infections: Secondary | ICD-10-CM

## 2015-08-15 NOTE — Assessment & Plan Note (Signed)
79 year old male with recent EGD as described above, doing well s/p dilation and completion of prevpac therapy for H.pylori. Will check urea breath test now to ensure eradication, then resume Protonix once daily. As of note, he has not been taking Protonix daily or any PPI (known ulcerative esophagitis). Next colonoscopy 2019 if age permits.

## 2015-08-15 NOTE — Progress Notes (Signed)
Referring Provider: Marjean Donna, MD Primary Care Physician:  Lanette Hampshire, MD  Primary GI: Dr. Laural Golden previously, seen urgently by our office in Sept 2016 and underwent endoscopy by Dr. Gala Romney   Chief Complaint  Patient presents with  . Follow-up    HPI:   Jeremy [name] is a 79 y.o. male presenting today with a history of likely benign anorectal bleeding, last colonoscopy 2014, with recent EGD due to dyspepsia by Dr. Gala Romney. Findings of ulcerative/erosive reflux esophagitis s/p dilation of peptic stricture. +H.pylori and prescribed prevpak.   Has not had any issues overall. Last night had some nausea after eating meat but resolved on own. No dysphagia. No rectal bleeding. Takes tums every once in awhile. NOT taking Protonix. Sweet potatoes gives him heartburn. Forgets to take it.   Past Medical History  Diagnosis Date  . Hypertension   . Hyperlipidemia   . CAD (coronary artery disease) 2005    anterior MI with stent to the LAD in 1991 / stent 1996 / nuclear 2005, no ischemia  . Abnormal CXR 4/20011    Chest  CT mode/serere COPD ( no mediastinal abnormality)  . Ejection fraction     55-60%, echo, April, 2011, could not estimate RV pressure  . Tobacco abuse   . COPD (chronic obstructive pulmonary disease) ([place])   . Carotid bruit     Doppler, June, 2013, 0-39% bilateral  . Kidney stone   . Shortness of breath     with exertion  . HOH (hard of hearing)   . Sleep apnea     Stop Bang score of 5    Past Surgical History  Procedure Laterality Date  . Cholecystectomy      8 years ago [facility]  . Cyst removal hand      right wrist  . Colonoscopy  10/05/2012    Dr. Laural Golden: multiple diverticula at sigmoid colon, multiple polyps (tubular adenomas), small external hemorrhoids  . Appendectomy    . Cataract extraction Left   . Cardiac catheterization    . Tonsillectomy    . Skin cancer excision  2015    ear & neck  . Cataract extraction w/phaco Right 01/22/2014    Procedure:  CATARACT EXTRACTION PHACO AND INTRAOCULAR LENS PLACEMENT (IOC);  Surgeon: Tonny Branch, MD;  Location: [facility] ORS;  Service: Ophthalmology;  Laterality: Right;  CDE 7.46  . Esophagogastroduodenoscopy N/A 05/23/2015    Dr. Gala Romney: Ulcerative/ erosive reflux esophagitis with peptic stricture formation. Status post dilation as described. Hiatal hernia. Abnormal gastric mucosa of uncertain significance status post gastric biospy. +H.pylori  . Esophageal dilation N/A 05/23/2015    Procedure: ESOPHAGEAL DILATION;  Surgeon: Daneil Dolin, MD;  Location: [facility] ENDO SUITE;  Service: Endoscopy;  Laterality: N/A;    Current Outpatient Prescriptions  Medication Sig Dispense Refill  . aspirin 81 MG tablet Take 81 mg by mouth daily.    . hydrocortisone (ANUSOL-HC) 2.5 % rectal cream Place 1 application rectally 2 (two) times daily. 30 g 1  . nitroGLYCERIN (NITROSTAT) 0.4 MG SL tablet Place 1 tablet (0.4 mg total) under the tongue every 5 (five) minutes x 3 doses as needed. 25 tablet 3  . ondansetron (ZOFRAN) 4 MG tablet Take 1 tablet (4 mg total) by mouth every 8 (eight) hours as needed for nausea or vomiting. 30 tablet 1  . pantoprazole (PROTONIX) 40 MG tablet Take 1 tablet (40 mg total) by mouth daily. 30 minutes before breakfast. 90 tablet 3  . simvastatin (ZOCOR)  40 MG tablet TAKE 1 TABLET BY MOUTH EVERY EVENING 30 tablet 11  . amoxicillin-clarithromycin-lansoprazole (PREVPAC) combo pack Take by mouth 2 (two) times daily. Follow package directions. (Patient not taking: Reported on 08/15/2015) 1 kit 0   No current facility-administered medications for this visit.    Allergies as of 08/15/2015  . (No Known Allergies)    Family History  Problem Relation Age of Onset  . Heart disease Mother   . Heart disease Father   . Heart disease Brother   . Colon cancer Neg Hx   . Stomach cancer Brother     Social History   Social History  . Marital Status: Married    Spouse Name: N/A  . Number of Children: N/A  .  Years of Education: N/A   Social History Main Topics  . Smoking status: Former Smoker -- 3.00 packs/day for 12 years    Types: Cigarettes    Quit date: 09/15/1995  . Smokeless tobacco: Current User    Types: Chew     Comment: chews 80% of a pack tobacco per day -- 10/12 doesnt chew too much  . Alcohol Use: No  . Drug Use: No  . Sexual Activity: Not Asked   Other Topics Concern  . None   Social History Narrative    Review of Systems: Negative unless mentioned in HPI   Physical Exam: BP 142/81 mmHg  Pulse 66  Temp(Src) 97.8 F (36.6 C) (Oral)  Ht 5' 7"  (1.702 m)  Wt 182 lb (82.555 kg)  BMI 28.50 kg/m2 General:   Alert and oriented. No distress noted. Pleasant and cooperative.  Head:  Normocephalic and atraumatic. Abdomen:  +BS, soft, non-tender and non-distended. No rebound or guarding. No HSM or masses noted. Msk:  Symmetrical without gross deformities. Normal posture. Extremities:  Without edema. Neurologic:  Alert and  oriented x4;  grossly normal neurologically. Psych:  Alert and cooperative. Normal mood and affect.

## 2015-08-15 NOTE — Telephone Encounter (Signed)
Please nic patient to have colonoscopy 2019.

## 2015-08-15 NOTE — Patient Instructions (Signed)
Complete the breath test today.   Then, start taking Protonix once a day. This is important to protect your esophagus due to reflux changes.

## 2015-08-15 NOTE — Telephone Encounter (Signed)
ON RECALL  °

## 2015-08-15 NOTE — Progress Notes (Signed)
CC'D TO PCP °

## 2015-08-28 DIAGNOSIS — B9681 Helicobacter pylori [H. pylori] as the cause of diseases classified elsewhere: Secondary | ICD-10-CM | POA: Diagnosis not present

## 2015-08-29 LAB — H. PYLORI BREATH TEST: H. PYLORI BREATH TEST: NOT DETECTED

## 2015-09-01 NOTE — Progress Notes (Signed)
Quick Note:  H.pylori breath test negative. It has been eradicated. Needs to resume PPI. Otherwise, no further recommendations. ______

## 2015-09-12 DIAGNOSIS — J441 Chronic obstructive pulmonary disease with (acute) exacerbation: Secondary | ICD-10-CM | POA: Diagnosis not present

## 2015-09-12 DIAGNOSIS — J069 Acute upper respiratory infection, unspecified: Secondary | ICD-10-CM | POA: Diagnosis not present

## 2015-10-18 ENCOUNTER — Ambulatory Visit (INDEPENDENT_AMBULATORY_CARE_PROVIDER_SITE_OTHER): Payer: Medicare Other | Admitting: Cardiology

## 2015-10-18 ENCOUNTER — Encounter: Payer: Self-pay | Admitting: Cardiology

## 2015-10-18 VITALS — BP 122/84 | HR 74 | Ht 72.0 in | Wt 187.0 lb

## 2015-10-18 DIAGNOSIS — R0602 Shortness of breath: Secondary | ICD-10-CM

## 2015-10-18 DIAGNOSIS — I2510 Atherosclerotic heart disease of native coronary artery without angina pectoris: Secondary | ICD-10-CM | POA: Diagnosis not present

## 2015-10-18 DIAGNOSIS — I10 Essential (primary) hypertension: Secondary | ICD-10-CM

## 2015-10-18 MED ORDER — ALBUTEROL SULFATE HFA 108 (90 BASE) MCG/ACT IN AERS: 2.0000 | INHALATION_SPRAY | Freq: Four times a day (QID) | RESPIRATORY_TRACT | Status: DC | PRN

## 2015-10-18 MED ORDER — PREDNISONE 20 MG PO TABS: 20.0000 mg | ORAL_TABLET | Freq: Every day | ORAL | Status: DC

## 2015-10-18 MED ORDER — DOXYCYCLINE HYCLATE 50 MG PO CAPS: 100.0000 mg | ORAL_CAPSULE | Freq: Every day | ORAL | Status: DC

## 2015-10-18 NOTE — Progress Notes (Signed)
Patient ID: Jeremy [name], male   DOB: [birth year], 80 y.o.   MRN: [id number]     Clinical Summary [name] is a 80 y.o.male previous patient of Dr Ron Parker, this is our first visit together. He is seen for the following medical problems.   1. CAD - history of prior stenting to LAD - nuclear stress 03/2014 with no ischemia. LVEF 47%.  - denies any recent chest pain. Has had some sob over the last several weeks.    2. SOB - ongoing for 6 weeks. Reports episode of "bronchitis" around Christmas time and has not been right sine. SOB mainly with exertion. DOE at about 1 block, shorter distances with incline or carrying something - no chest pain, no palpitations. No LE edema, no orthopnea - Continues to have intermittent wheezing and cough.  - + tobacco 30 years, quit 20 years ago. Reports has never had formal PFTs.    Past Medical History  Diagnosis Date  . Hypertension   . Hyperlipidemia   . CAD (coronary artery disease) 2005    anterior MI with stent to the LAD in 1991 / stent 1996 / nuclear 2005, no ischemia  . Abnormal CXR 4/20011    Chest  CT mode/serere COPD ( no mediastinal abnormality)  . Ejection fraction     55-60%, echo, April, 2011, could not estimate RV pressure  . Tobacco abuse   . COPD (chronic obstructive pulmonary disease) ([place])   . Carotid bruit     Doppler, June, 2013, 0-39% bilateral  . Kidney stone   . Shortness of breath     with exertion  . HOH (hard of hearing)   . Sleep apnea     Stop Bang score of 5     No Known Allergies   Current Outpatient Prescriptions  Medication Sig Dispense Refill  . aspirin 81 MG tablet Take 81 mg by mouth daily.    . nitroGLYCERIN (NITROSTAT) 0.4 MG SL tablet Place 1 tablet (0.4 mg total) under the tongue every 5 (five) minutes x 3 doses as needed. 25 tablet 3  . pantoprazole (PROTONIX) 40 MG tablet Take 40 mg by mouth daily.    . simvastatin (ZOCOR) 40 MG tablet TAKE 1 TABLET BY MOUTH EVERY EVENING 30 tablet 11   No  current facility-administered medications for this visit.     Past Surgical History  Procedure Laterality Date  . Cholecystectomy      8 years ago [facility]  . Cyst removal hand      right wrist  . Colonoscopy  10/05/2012    Dr. Laural Golden: multiple diverticula at sigmoid colon, multiple polyps (tubular adenomas), small external hemorrhoids  . Appendectomy    . Cataract extraction Left   . Cardiac catheterization    . Tonsillectomy    . Skin cancer excision  2015    ear & neck  . Cataract extraction w/phaco Right 01/22/2014    Procedure: CATARACT EXTRACTION PHACO AND INTRAOCULAR LENS PLACEMENT (IOC);  Surgeon: Tonny Samanthajo Payano, MD;  Location: [facility] ORS;  Service: Ophthalmology;  Laterality: Right;  CDE 7.46  . Esophagogastroduodenoscopy N/A 05/23/2015    Dr. Gala Romney: Ulcerative/ erosive reflux esophagitis with peptic stricture formation. Status post dilation as described. Hiatal hernia. Abnormal gastric mucosa of uncertain significance status post gastric biospy. +H.pylori  . Esophageal dilation N/A 05/23/2015    Procedure: ESOPHAGEAL DILATION;  Surgeon: Daneil Dolin, MD;  Location: [facility];  Service: Endoscopy;  Laterality: N/A;     No Known  Allergies    Family History  Problem Relation Age of Onset  . Heart disease Mother   . Heart disease Father   . Heart disease Brother   . Colon cancer Neg Hx   . Stomach cancer Brother      Social History Jeremy [name] reports that he quit smoking about 20 years ago. His smoking use included Cigarettes. He has a 36 pack-year smoking history. His smokeless tobacco use includes Chew. Jeremy [name] reports that he does not drink alcohol.   Review of Systems CONSTITUTIONAL: No weight loss, fever, chills, weakness or fatigue.  HEENT: Eyes: No visual loss, blurred vision, double vision or yellow sclerae.No hearing loss, sneezing, congestion, runny nose or sore throat.  SKIN: No rash or itching.  CARDIOVASCULAR: per hpi RESPIRATORY: per hpi GASTROINTESTINAL:  No anorexia, nausea, vomiting or diarrhea. No abdominal pain or blood.  GENITOURINARY: No burning on urination, no polyuria NEUROLOGICAL: No headache, dizziness, syncope, paralysis, ataxia, numbness or tingling in the extremities. No change in bowel or bladder control.  MUSCULOSKELETAL: No muscle, back pain, joint pain or stiffness.  LYMPHATICS: No enlarged nodes. No history of splenectomy.  PSYCHIATRIC: No history of depression or anxiety.  ENDOCRINOLOGIC: No reports of sweating, cold or heat intolerance. No polyuria or polydipsia.  Marland Kitchen   Physical Examination Filed Vitals:   10/18/15 0818  BP: 122/84  Pulse: 74   Filed Weights   10/18/15 0818  Weight: 187 lb (84.823 kg)    Gen: resting comfortably, no acute distress HEENT: no scleral icterus, pupils equal round and reactive, no palptable cervical adenopathy,  CV: RRR, no m/r/g, no jvd Resp: Clear to auscultation bilaterally GI: abdomen is soft, non-tender, non-distended, normal bowel sounds, no hepatosplenomegaly MSK: extremities are warm, no edema.  Skin: warm, no rash Neuro:  no focal deficits Psych: appropriate affect   Diagnostic Studies 10/18/15 Clinic EKG (performed and reviewed in clinic): NSR    Assessment and Plan  1. CAD - no current chest pain, continue current meds  2. SOB - symptoms suggestive of pulmonary cause. Given his long smoking history he likely has some component of COPD, potentially exacerbated by recent URI. Will treat with 7 days of doxycycline as well as prednisone 40mg  daily. Will given prn albuterol. Once acute symptoms improve will need formal PFTs.   F/u 3 months      Arnoldo Lenis, M.D.

## 2015-10-18 NOTE — Patient Instructions (Signed)
Your physician recommends that you schedule a follow-up appointment in: 3 MONTHS WITH DR. BRANCH  Your physician has recommended you make the following change in your medication:   TAKE PREDNISONE 40 MG DAILY FOR 7 DAYS  TAKE DOXYCYCLINE 100 MG DAILY FOR 7 DAYS   START ALBUTEROL INHALER 2 PUFFS EVERY 6 HOURS AS NEEDED FOR SHORTNESS OF BREATH  PLEASE CALL us IN 1 WEEK WITH AN UPDATE ON YOUR SYMPTOMS  Thank you for choosing [facility]!!

## 2015-10-29 ENCOUNTER — Telehealth: Payer: Self-pay | Admitting: *Deleted

## 2015-10-29 DIAGNOSIS — R0602 Shortness of breath: Secondary | ICD-10-CM

## 2015-10-29 NOTE — Telephone Encounter (Signed)
1 week f/u with pt on symptoms, pt says he feel much better, finished prednisone and doxycycline and using inhaler daily. Pt appreciative of call, will forward to provider as Juluis Rainier

## 2015-10-29 NOTE — Telephone Encounter (Signed)
Glad to hear he is doing better. Can we order PFTs for him   J Neco Kling MD

## 2015-10-29 NOTE — Addendum Note (Signed)
Addended by: Julian Hy T on: 10/29/2015 04:46 PM   Modules accepted: Orders

## 2015-10-29 NOTE — Telephone Encounter (Signed)
Pt wife called back to confirm that pt did not have a breathing test, had

## 2015-10-29 NOTE — Telephone Encounter (Signed)
Pt says he had PFT's done in the last week. Didn't remember where at but not done at [facility]. Pt is going to have wife call back with info so that we may request those results

## 2015-10-29 NOTE — Telephone Encounter (Signed)
Hydrogen breath test, orders placed for PFT'S, will forward to schedulers and call pt back with date and time.

## 2015-11-01 ENCOUNTER — Telehealth: Payer: Self-pay | Admitting: *Deleted

## 2015-11-01 NOTE — Telephone Encounter (Signed)
-----   Message from Chanda Busing sent at 10/31/2015  1:27 PM EST ----- Friday, 11-08-15 Arrive at 12:45pm for registration No smoking---4 hours pior to appointment no breathing inhalers no caffeine   ----- Message -----    From: Massie Maroon, CMA    Sent: 10/29/2015   4:45 PM      To: Chanda Busing  Can you please schedule PFT's for this pt? I can call pt with date and time. Thank you.  Freddye Cardamone

## 2015-11-01 NOTE — Telephone Encounter (Signed)
Pt aware and voices understanding of instructions

## 2015-11-08 ENCOUNTER — Ambulatory Visit (HOSPITAL_COMMUNITY)
Admission: RE | Admit: 2015-11-08 | Discharge: 2015-11-08 | Disposition: A | Discharge status: Home / Self Care | Payer: Medicare Other | Source: Ambulatory Visit | Attending: Cardiology | Admitting: Cardiology

## 2015-11-08 DIAGNOSIS — R0602 Shortness of breath: Secondary | ICD-10-CM | POA: Insufficient documentation

## 2015-11-08 LAB — PULMONARY FUNCTION TEST
DL/VA % PRED: 50 %
DL/VA: 2.36 ml/min/mmHg/L
DLCO unc % pred: 37 %
DLCO unc: 13.17 ml/min/mmHg
FEF 25-75 POST: 0.99 L/s
FEF 25-75 Pre: 0.69 L/sec
FEF2575-%Change-Post: 44 %
FEF2575-%Pred-Post: 43 %
FEF2575-%Pred-Pre: 30 %
FEV1-%CHANGE-POST: 10 %
FEV1-%PRED-PRE: 52 %
FEV1-%Pred-Post: 58 %
FEV1-POST: 1.89 L
FEV1-PRE: 1.7 L
FEV1FVC-%Change-Post: 0 %
FEV1FVC-%PRED-PRE: 68 %
FEV6-%Change-Post: 10 %
FEV6-%PRED-POST: 88 %
FEV6-%PRED-PRE: 79 %
FEV6-POST: 3.69 L
FEV6-PRE: 3.33 L
FEV6FVC-%CHANGE-POST: 0 %
FEV6FVC-%PRED-POST: 102 %
FEV6FVC-%PRED-PRE: 102 %
FVC-%Change-Post: 10 %
FVC-%PRED-PRE: 77 %
FVC-%Pred-Post: 85 %
FVC-POST: 3.82 L
FVC-Pre: 3.45 L
POST FEV6/FVC RATIO: 96 %
PRE FEV6/FVC RATIO: 97 %
Post FEV1/FVC ratio: 49 %
Pre FEV1/FVC ratio: 49 %
RV % PRED: 115 %
RV: 3.14 L
TLC % PRED: 98 %
TLC: 7.36 L

## 2015-11-08 MED ORDER — ALBUTEROL SULFATE (2.5 MG/3ML) 0.083% IN NEBU
2.5000 mg | INHALATION_SOLUTION | Freq: Once | RESPIRATORY_TRACT | Status: AC
  Administered 2015-11-08: 2.5 mg via RESPIRATORY_TRACT

## 2015-11-11 ENCOUNTER — Telehealth: Payer: Self-pay | Admitting: *Deleted

## 2015-11-11 MED ORDER — TIOTROPIUM BROMIDE MONOHYDRATE 18 MCG IN CAPS: 18.0000 ug | ORAL_CAPSULE | Freq: Every day | RESPIRATORY_TRACT | Status: DC

## 2015-11-11 NOTE — Telephone Encounter (Signed)
Can we look at his insurance tiers and see what inhalers are preferred?   Zandra Abts MD

## 2015-11-11 NOTE — Telephone Encounter (Signed)
Spoke with pharmacy and the cost actually refected a deductible that pt had to meet, and this would apply no matter what medication was sent. LM for pt to call back

## 2015-11-11 NOTE — Telephone Encounter (Signed)
He needs to establish with Dr Maudie Mercury who took Dr Orson Gear place, please ask him to do so   Zandra Abts MD

## 2015-11-11 NOTE — Telephone Encounter (Signed)
Pt went to pick up spiriva inhaler and will cost him $368 (cannot afford this). Would like to know if there is another alternative.

## 2015-11-11 NOTE — Telephone Encounter (Signed)
Pt aware, Dr Everette Rank retired no pcp. Medication sent to pharmacy.

## 2015-11-11 NOTE — Telephone Encounter (Signed)
Pt aware and says he will let us know when he establishes

## 2015-11-11 NOTE — Telephone Encounter (Signed)
-----   Message from Arnoldo Lenis, MD sent at 11/11/2015  9:47 AM EST ----- Breathing tests show COPD. Please start spirivia 51mcg daily, and forward results to pcp  Zandra Abts MD

## 2015-12-09 DIAGNOSIS — I2510 Atherosclerotic heart disease of native coronary artery without angina pectoris: Secondary | ICD-10-CM | POA: Diagnosis not present

## 2015-12-09 DIAGNOSIS — J449 Chronic obstructive pulmonary disease, unspecified: Secondary | ICD-10-CM | POA: Diagnosis not present

## 2015-12-09 DIAGNOSIS — I10 Essential (primary) hypertension: Secondary | ICD-10-CM | POA: Diagnosis not present

## 2015-12-09 DIAGNOSIS — M25511 Pain in right shoulder: Secondary | ICD-10-CM | POA: Diagnosis not present

## 2015-12-17 DIAGNOSIS — H9312 Tinnitus, left ear: Secondary | ICD-10-CM | POA: Insufficient documentation

## 2015-12-17 DIAGNOSIS — R0989 Other specified symptoms and signs involving the circulatory and respiratory systems: Secondary | ICD-10-CM | POA: Insufficient documentation

## 2015-12-17 DIAGNOSIS — H903 Sensorineural hearing loss, bilateral: Secondary | ICD-10-CM | POA: Insufficient documentation

## 2015-12-17 DIAGNOSIS — H6123 Impacted cerumen, bilateral: Secondary | ICD-10-CM | POA: Diagnosis not present

## 2015-12-17 DIAGNOSIS — F17200 Nicotine dependence, unspecified, uncomplicated: Secondary | ICD-10-CM | POA: Insufficient documentation

## 2016-01-03 DIAGNOSIS — H9319 Tinnitus, unspecified ear: Secondary | ICD-10-CM | POA: Diagnosis not present

## 2016-01-03 DIAGNOSIS — H00015 Hordeolum externum left lower eyelid: Secondary | ICD-10-CM | POA: Diagnosis not present

## 2016-01-03 DIAGNOSIS — R05 Cough: Secondary | ICD-10-CM | POA: Diagnosis not present

## 2016-01-03 DIAGNOSIS — J449 Chronic obstructive pulmonary disease, unspecified: Secondary | ICD-10-CM | POA: Diagnosis not present

## 2016-01-13 ENCOUNTER — Ambulatory Visit (INDEPENDENT_AMBULATORY_CARE_PROVIDER_SITE_OTHER): Payer: Medicare Other | Admitting: Cardiology

## 2016-01-13 ENCOUNTER — Encounter: Payer: Self-pay | Admitting: Cardiology

## 2016-01-13 VITALS — BP 126/74 | HR 71 | Ht 72.0 in | Wt 188.0 lb

## 2016-01-13 DIAGNOSIS — E785 Hyperlipidemia, unspecified: Secondary | ICD-10-CM | POA: Diagnosis not present

## 2016-01-13 DIAGNOSIS — I2510 Atherosclerotic heart disease of native coronary artery without angina pectoris: Secondary | ICD-10-CM | POA: Diagnosis not present

## 2016-01-13 DIAGNOSIS — J449 Chronic obstructive pulmonary disease, unspecified: Secondary | ICD-10-CM | POA: Diagnosis not present

## 2016-01-13 NOTE — Progress Notes (Signed)
Patient ID: Jeremy [name], male   DOB: 1936-01-04, 80 y.o.   MRN: [id number]     Clinical Summary Jeremy [name] is a 80 y.o.male seen for the following medical problems.    1. CAD - history of prior stenting to LAD - nuclear stress 03/2014 with no ischemia. LVEF 47%.   - denies any chest pain. No SOB or DOE  2. COPD - PFTs 10/2015 with moderate COPD - recently started on spiriva and albuterol with improved symptoms.    3. Hyperlipidemia - reports upcoming labs with pcp. Has been on zocor for several years   Past Medical History  Diagnosis Date  . Hypertension   . Hyperlipidemia   . CAD (coronary artery disease) 2005    anterior MI with stent to the LAD in 1991 / stent 1996 / nuclear 2005, no ischemia  . Abnormal CXR 4/20011    Chest  CT mode/serere COPD ( no mediastinal abnormality)  . Ejection fraction     55-60%, echo, April, 2011, could not estimate RV pressure  . Tobacco abuse   . COPD (chronic obstructive pulmonary disease) ([place])   . Carotid bruit     Doppler, June, 2013, 0-39% bilateral  . Kidney stone   . Shortness of breath     with exertion  . HOH (hard of hearing)   . Sleep apnea     Stop Bang score of 5     No Known Allergies   Current Outpatient Prescriptions  Medication Sig Dispense Refill  . albuterol (PROVENTIL HFA;VENTOLIN HFA) 108 (90 Base) MCG/ACT inhaler Inhale 2 puffs into the lungs every 6 (six) hours as needed for wheezing or shortness of breath. 1 Inhaler 3  . aspirin 81 MG tablet Take 81 mg by mouth daily.    Marland Kitchen doxycycline (VIBRAMYCIN) 50 MG capsule Take 2 capsules (100 mg total) by mouth daily. 14 capsule 0  . nitroGLYCERIN (NITROSTAT) 0.4 MG SL tablet Place 1 tablet (0.4 mg total) under the tongue every 5 (five) minutes x 3 doses as needed. 25 tablet 3  . pantoprazole (PROTONIX) 40 MG tablet Take 40 mg by mouth daily.    . predniSONE (DELTASONE) 20 MG tablet Take 1 tablet (20 mg total) by mouth daily with breakfast. 14 tablet 0  .  simvastatin (ZOCOR) 40 MG tablet TAKE 1 TABLET BY MOUTH EVERY EVENING 30 tablet 11  . tiotropium (SPIRIVA HANDIHALER) 18 MCG inhalation capsule Place 1 capsule (18 mcg total) into inhaler and inhale daily. 30 capsule 12   No current facility-administered medications for this visit.     Past Surgical History  Procedure Laterality Date  . Cholecystectomy      8 years ago [facility]  . Cyst removal hand      right wrist  . Colonoscopy  10/05/2012    Dr. Laural Golden: multiple diverticula at sigmoid colon, multiple polyps (tubular adenomas), small external hemorrhoids  . Appendectomy    . Cataract extraction Left   . Cardiac catheterization    . Tonsillectomy    . Skin cancer excision  2015    ear & neck  . Cataract extraction w/phaco Right 01/22/2014    Procedure: CATARACT EXTRACTION PHACO AND INTRAOCULAR LENS PLACEMENT (IOC);  Surgeon: Tonny Hatsuko Bizzarro, MD;  Location: [facility];  Service: Ophthalmology;  Laterality: Right;  CDE 7.46  . Esophagogastroduodenoscopy N/A 05/23/2015    Dr. Gala Romney: Ulcerative/ erosive reflux esophagitis with peptic stricture formation. Status post dilation as described. Hiatal hernia. Abnormal gastric mucosa of uncertain significance  status post gastric biospy. +H.pylori  . Esophageal dilation N/A 05/23/2015    Procedure: ESOPHAGEAL DILATION;  Surgeon: Daneil Dolin, MD;  Location: [facility];  Service: Endoscopy;  Laterality: N/A;     No Known Allergies    Family History  Problem Relation Age of Onset  . Heart disease Mother   . Heart disease Father   . Heart disease Brother   . Colon cancer Neg Hx   . Stomach cancer Brother      Social History Jeremy [name] reports that he quit smoking about 20 years ago. His smoking use included Cigarettes. He has a 36 pack-year smoking history. His smokeless tobacco use includes Chew. Jeremy [name] reports that he does not drink alcohol.   Review of Systems CONSTITUTIONAL: No weight loss, fever, chills, weakness or fatigue.  HEENT:  Eyes: No visual loss, blurred vision, double vision or yellow sclerae.No hearing loss, sneezing, congestion, runny nose or sore throat.  SKIN: No rash or itching.  CARDIOVASCULAR: per HPI RESPIRATORY: No shortness of breath, cough or sputum.  GASTROINTESTINAL: No anorexia, nausea, vomiting or diarrhea. No abdominal pain or blood.  GENITOURINARY: No burning on urination, no polyuria NEUROLOGICAL: No headache, dizziness, syncope, paralysis, ataxia, numbness or tingling in the extremities. No change in bowel or bladder control.  MUSCULOSKELETAL: No muscle, back pain, joint pain or stiffness.  LYMPHATICS: No enlarged nodes. No history of splenectomy.  PSYCHIATRIC: No history of depression or anxiety.  ENDOCRINOLOGIC: No reports of sweating, cold or heat intolerance. No polyuria or polydipsia.  Marland Kitchen   Physical Examination Filed Vitals:   01/13/16 0815  BP: 126/74  Pulse: 71   Filed Vitals:   01/13/16 0815  Height: 6' (1.829 m)  Weight: 188 lb (85.276 kg)    Gen: resting comfortably, no acute distress HEENT: no scleral icterus, pupils equal round and reactive, no palptable cervical adenopathy,  CV: RRR, no m/r/g, no jvd Resp: Clear to auscultation bilaterally GI: abdomen is soft, non-tender, non-distended, normal bowel sounds, no hepatosplenomegaly MSK: extremities are warm, no edema.  Skin: warm, no rash Neuro:  no focal deficits Psych: appropriate affect      Assessment and Plan  1. CAD - no current chest pain, we will continue his current meds  2. COPD -  PFTs ordered last visit 10/2015 showed moderate COPD, symptoms improved since starting inhalers - continue current meds  3. Hyperlipidemia - upcoming labs with pcp. Pending results, would consider changing to high dose statin in the setting of known CAD, consider atorva 80mg  daily.     F/u 6 months  Arnoldo Lenis, M.D.

## 2016-01-13 NOTE — Patient Instructions (Signed)

## 2016-01-16 DIAGNOSIS — L570 Actinic keratosis: Secondary | ICD-10-CM | POA: Diagnosis not present

## 2016-01-16 DIAGNOSIS — L118 Other specified acantholytic disorders: Secondary | ICD-10-CM | POA: Diagnosis not present

## 2016-01-16 DIAGNOSIS — D2361 Other benign neoplasm of skin of right upper limb, including shoulder: Secondary | ICD-10-CM | POA: Diagnosis not present

## 2016-01-16 DIAGNOSIS — D0421 Carcinoma in situ of skin of right ear and external auricular canal: Secondary | ICD-10-CM | POA: Diagnosis not present

## 2016-01-16 DIAGNOSIS — D225 Melanocytic nevi of trunk: Secondary | ICD-10-CM | POA: Diagnosis not present

## 2016-01-16 DIAGNOSIS — L820 Inflamed seborrheic keratosis: Secondary | ICD-10-CM | POA: Diagnosis not present

## 2016-01-16 DIAGNOSIS — X32XXXD Exposure to sunlight, subsequent encounter: Secondary | ICD-10-CM | POA: Diagnosis not present

## 2016-04-15 ENCOUNTER — Other Ambulatory Visit: Payer: Self-pay | Admitting: Cardiology

## 2016-05-02 ENCOUNTER — Emergency Department (HOSPITAL_COMMUNITY): Payer: Medicare Other

## 2016-05-02 ENCOUNTER — Encounter (HOSPITAL_COMMUNITY): Payer: Self-pay | Admitting: Emergency Medicine

## 2016-05-02 ENCOUNTER — Observation Stay (HOSPITAL_COMMUNITY)
Admission: EM | Admit: 2016-05-02 | Discharge: 2016-05-03 | Disposition: A | Discharge status: Home / Self Care | Payer: Medicare Other | Attending: Internal Medicine | Admitting: Internal Medicine

## 2016-05-02 DIAGNOSIS — E785 Hyperlipidemia, unspecified: Secondary | ICD-10-CM | POA: Diagnosis not present

## 2016-05-02 DIAGNOSIS — Z87891 Personal history of nicotine dependence: Secondary | ICD-10-CM | POA: Diagnosis not present

## 2016-05-02 DIAGNOSIS — J449 Chronic obstructive pulmonary disease, unspecified: Secondary | ICD-10-CM | POA: Diagnosis not present

## 2016-05-02 DIAGNOSIS — Z7982 Long term (current) use of aspirin: Secondary | ICD-10-CM | POA: Insufficient documentation

## 2016-05-02 DIAGNOSIS — I2510 Atherosclerotic heart disease of native coronary artery without angina pectoris: Secondary | ICD-10-CM | POA: Diagnosis not present

## 2016-05-02 DIAGNOSIS — K2100 Gastro-esophageal reflux disease with esophagitis, without bleeding: Secondary | ICD-10-CM | POA: Diagnosis present

## 2016-05-02 DIAGNOSIS — I10 Essential (primary) hypertension: Secondary | ICD-10-CM | POA: Diagnosis present

## 2016-05-02 DIAGNOSIS — Z23 Encounter for immunization: Secondary | ICD-10-CM | POA: Insufficient documentation

## 2016-05-02 DIAGNOSIS — R072 Precordial pain: Secondary | ICD-10-CM | POA: Diagnosis not present

## 2016-05-02 DIAGNOSIS — R079 Chest pain, unspecified: Secondary | ICD-10-CM | POA: Diagnosis not present

## 2016-05-02 DIAGNOSIS — E782 Mixed hyperlipidemia: Secondary | ICD-10-CM | POA: Diagnosis present

## 2016-05-02 HISTORY — DX: Acute myocardial infarction, unspecified: I21.9

## 2016-05-02 LAB — BASIC METABOLIC PANEL
Anion gap: 5 (ref 5–15)
BUN: 17 mg/dL (ref 6–20)
CALCIUM: 9 mg/dL (ref 8.9–10.3)
CO2: 30 mmol/L (ref 22–32)
CREATININE: 1.13 mg/dL (ref 0.61–1.24)
Chloride: 107 mmol/L (ref 101–111)
Glucose, Bld: 91 mg/dL (ref 65–99)
Potassium: 3.6 mmol/L (ref 3.5–5.1)
SODIUM: 142 mmol/L (ref 135–145)

## 2016-05-02 LAB — CBC
HCT: 46.2 % (ref 39.0–52.0)
HEMOGLOBIN: 15.8 g/dL (ref 13.0–17.0)
MCH: 30.2 pg (ref 26.0–34.0)
MCHC: 34.2 g/dL (ref 30.0–36.0)
MCV: 88.2 fL (ref 78.0–100.0)
PLATELETS: 188 10*3/uL (ref 150–400)
RBC: 5.24 MIL/uL (ref 4.22–5.81)
RDW: 13.7 % (ref 11.5–15.5)
WBC: 7.1 10*3/uL (ref 4.0–10.5)

## 2016-05-02 LAB — TROPONIN I: Troponin I: <0.03 ng/mL (ref <0.03)

## 2016-05-02 LAB — I-STAT TROPONIN, ED: TROPONIN I, POC: 0 ng/mL (ref 0.00–0.08)

## 2016-05-02 MED ORDER — MORPHINE SULFATE (PF) 2 MG/ML IV SOLN: 2.0000 mg | INTRAVENOUS | Status: DC | PRN

## 2016-05-02 MED ORDER — ASPIRIN EC 81 MG PO TBEC
81.0000 mg | DELAYED_RELEASE_TABLET | Freq: Every day | ORAL | Status: DC
  Administered 2016-05-02 – 2016-05-03 (×2): 81 mg via ORAL
  Filled 2016-05-02 (×2): qty 1

## 2016-05-02 MED ORDER — PNEUMOCOCCAL VAC POLYVALENT 25 MCG/0.5ML IJ INJ
0.5000 mL | INJECTION | INTRAMUSCULAR | Status: AC
  Administered 2016-05-03: 0.5 mL via INTRAMUSCULAR
  Filled 2016-05-02: qty 0.5

## 2016-05-02 MED ORDER — LEVOFLOXACIN 750 MG PO TABS
750.0000 mg | ORAL_TABLET | Freq: Once | ORAL | Status: AC
  Administered 2016-05-02: 750 mg via ORAL
  Filled 2016-05-02: qty 1

## 2016-05-02 MED ORDER — ACETAMINOPHEN 325 MG PO TABS: 650.0000 mg | ORAL_TABLET | ORAL | Status: DC | PRN

## 2016-05-02 MED ORDER — ASPIRIN 81 MG PO TABS: 81.0000 mg | ORAL_TABLET | Freq: Every evening | ORAL | Status: DC

## 2016-05-02 MED ORDER — ONDANSETRON HCL 4 MG/2ML IJ SOLN: 4.0000 mg | Freq: Four times a day (QID) | INTRAMUSCULAR | Status: DC | PRN

## 2016-05-02 MED ORDER — LEVOFLOXACIN IN D5W 750 MG/150ML IV SOLN: 750.0000 mg | INTRAVENOUS | Status: DC

## 2016-05-02 MED ORDER — ASPIRIN 81 MG PO CHEW
324.0000 mg | CHEWABLE_TABLET | Freq: Once | ORAL | Status: AC
  Administered 2016-05-02: 324 mg via ORAL
  Filled 2016-05-02: qty 4

## 2016-05-02 MED ORDER — ENOXAPARIN SODIUM 40 MG/0.4ML ~~LOC~~ SOLN
40.0000 mg | Freq: Every day | SUBCUTANEOUS | Status: DC
  Administered 2016-05-02: 40 mg via SUBCUTANEOUS
  Filled 2016-05-02: qty 0.4

## 2016-05-02 MED ORDER — TIOTROPIUM BROMIDE MONOHYDRATE 18 MCG IN CAPS
18.0000 ug | ORAL_CAPSULE | Freq: Every day | RESPIRATORY_TRACT | Status: DC
  Administered 2016-05-02 – 2016-05-03 (×2): 18 ug via RESPIRATORY_TRACT
  Filled 2016-05-02: qty 5

## 2016-05-02 MED ORDER — TIOTROPIUM BROMIDE MONOHYDRATE 18 MCG IN CAPS
ORAL_CAPSULE | RESPIRATORY_TRACT | Status: AC
  Filled 2016-05-02: qty 5

## 2016-05-02 MED ORDER — SIMVASTATIN 20 MG PO TABS
40.0000 mg | ORAL_TABLET | Freq: Every evening | ORAL | Status: DC
  Administered 2016-05-02: 40 mg via ORAL
  Filled 2016-05-02: qty 2

## 2016-05-02 NOTE — Progress Notes (Signed)
Pharmacy Antibiotic Note  Jeremy [name] is a 80 y.o. male admitted on 05/02/2016 with pneumonia.  Pharmacy has been consulted for levaquin dosing.  Plan: Levaquin 750 mg IV q24 hours F/u renal function, cultures and clinical course  Height: 6' (182.9 cm) Weight: 181 lb 11.2 oz (82.4 kg) IBW/kg (Calculated) : 77.6  Temp (24hrs), Avg:98.3 F (36.8 C), Min:98.1 F (36.7 C), Max:98.5 F (36.9 C)   Recent Labs Lab 05/02/16 1711  WBC 7.1  CREATININE 1.13    Estimated Creatinine Clearance: 59.1 mL/min (by C-G formula based on SCr of 1.13 mg/dL).    No Known Allergies  Antimicrobials this admission: levaquin 8/19 >>    Thank you for allowing pharmacy to be a part of this patient's care.  Excell Seltzer Poteet 05/02/2016 9:24 PM

## 2016-05-02 NOTE — ED Triage Notes (Signed)
Pt reports pain in central chest no radiation x30 minutes.  Wife gave pt 1 nitro with little relief.  Pt 4/10 pain at this time.  Pt takes 81 mg asa each day. Pt lightheaded and has been diaphoretic.

## 2016-05-02 NOTE — ED Provider Notes (Signed)
[facility] DEPT Provider Note   CSN: [id number] Arrival date & time: 05/02/16  1631     History   Chief Complaint Chief Complaint  Patient presents with  . Chest Pain    HPI Jeremy [name] is a 80 y.o. male.Presents today complaining of substernal chest pain that began while he was resting. He states that it felt heavy and tight in nature. It was substernal without radiation. The pain lasted approximately 40 minutes during which time he came to the hospital. He states it resolved prior to my evaluation. He took one sublingual nitroglycerin. He takes aspirin daily at night and has not had any today. He states he's had coronary artery disease with 2 stents and MI in 1990 and 1996. He states he has not had any chest pain for a long time. This pain felt like pain with his MI.  HPI  Past Medical History:  Diagnosis Date  . Abnormal CXR 4/20011   Chest  CT mode/serere COPD ( no mediastinal abnormality)  . CAD (coronary artery disease) 2005   anterior MI with stent to the LAD in 1991 / stent 1996 / nuclear 2005, no ischemia  . Carotid bruit    Doppler, June, 2013, 0-39% bilateral  . COPD (chronic obstructive pulmonary disease) ([place])   . Ejection fraction    55-60%, echo, April, 2011, could not estimate RV pressure  . HOH (hard of hearing)   . Hyperlipidemia   . Hypertension   . Kidney stone   . Myocardial infarction ([place])   . Shortness of breath    with exertion  . Sleep apnea    Stop Bang score of 5  . Tobacco abuse     Patient Active Problem List   Diagnosis Date Noted  . H. pylori infection 08/15/2015  . Mucosal abnormality of stomach   . Reflux esophagitis   . Peptic stricture of esophagus   . Rectal bleeding 05/21/2015  . Dyspepsia 05/21/2015  . Dysphagia, pharyngoesophageal phase 05/21/2015  . Sinus bradycardia 03/19/2014  . Carotid artery disease ([place]) 03/15/2014  . Hypertension   . Hyperlipidemia   . CAD (coronary artery disease)   . Abnormal CXR   .  Ejection fraction   . Tobacco abuse   . COPD (chronic obstructive pulmonary disease) ([place])     Past Surgical History:  Procedure Laterality Date  . APPENDECTOMY    . CARDIAC CATHETERIZATION    . CATARACT EXTRACTION Left   . CATARACT EXTRACTION W/PHACO Right 01/22/2014   Procedure: CATARACT EXTRACTION PHACO AND INTRAOCULAR LENS PLACEMENT (IOC);  Surgeon: Tonny Branch, MD;  Location: [facility];  Service: Ophthalmology;  Laterality: Right;  CDE 7.46  . CHOLECYSTECTOMY     8 years ago [facility]  . COLONOSCOPY  10/05/2012   Dr. Laural Golden: multiple diverticula at sigmoid colon, multiple polyps (tubular adenomas), small external hemorrhoids  . CYST REMOVAL HAND     right wrist  . ESOPHAGEAL DILATION N/A 05/23/2015   Procedure: ESOPHAGEAL DILATION;  Surgeon: Daneil Dolin, MD;  Location: [facility];  Service: Endoscopy;  Laterality: N/A;  . ESOPHAGOGASTRODUODENOSCOPY N/A 05/23/2015   Dr. Gala Romney: Ulcerative/ erosive reflux esophagitis with peptic stricture formation. Status post dilation as described. Hiatal hernia. Abnormal gastric mucosa of uncertain significance status post gastric biospy. +H.pylori  . PERCUTANEOUS STENT INTERVENTION    . SKIN CANCER EXCISION  2015   ear & neck  . TONSILLECTOMY         Home Medications    Prior to  Admission medications   Medication Sig Start Date End Date Taking? Authorizing Provider  albuterol (PROVENTIL HFA;VENTOLIN HFA) 108 (90 Base) MCG/ACT inhaler Inhale 2 puffs into the lungs every 6 (six) hours as needed for wheezing or shortness of breath. 10/18/15  Yes Arnoldo Lenis, MD  aspirin 81 MG tablet Take 81 mg by mouth every evening.    Yes Historical Provider, MD  Aspirin-Salicylamide-Caffeine (BC HEADACHE) 325-95-16 MG TABS Take 1 packet by mouth daily as needed (for pain).   Yes Historical Provider, MD  nitroGLYCERIN (NITROSTAT) 0.4 MG SL tablet Place 1 tablet (0.4 mg total) under the tongue every 5 (five) minutes x 3 doses as needed. 06/25/14  Yes Carlena Bjornstad, MD  simvastatin (ZOCOR) 40 MG tablet TAKE 1 TABLET BY MOUTH EVERY EVENING 04/16/16  Yes Arnoldo Lenis, MD  tiotropium (SPIRIVA HANDIHALER) 18 MCG inhalation capsule Place 1 capsule (18 mcg total) into inhaler and inhale daily. Patient taking differently: Place 18 mcg into inhaler and inhale daily as needed (for shortness of breath).  11/11/15   Arnoldo Lenis, MD    Family History Family History  Problem Relation Age of Onset  . Heart disease Mother   . Heart disease Father   . Heart disease Brother   . Stomach cancer Brother   . Colon cancer Neg Hx     Social History Social History  Substance Use Topics  . Smoking status: Former Smoker    Packs/day: 3.00    Years: 12.00    Types: Cigarettes    Start date: 09/15/1983    Quit date: 09/14/1994  . Smokeless tobacco: Current User    Types: Chew     Comment: chews 80% of a pack tobacco per day -- 10/12 doesnt chew too much  . Alcohol use No     Allergies   Review of patient's allergies indicates no known allergies.   Review of Systems Review of Systems  All other systems reviewed and are negative.    Physical Exam Updated Vital Signs BP 119/72   Pulse 60   Temp 98.1 F (36.7 C) (Oral)   Resp 22   Ht 6' (1.829 m)   Wt 86.2 kg   SpO2 96%   BMI 25.77 kg/m   Physical Exam  Constitutional: He is oriented to person, place, and time. He appears well-developed and well-nourished. No distress.  HENT:  Head: Normocephalic and atraumatic.  Right Ear: External ear normal.  Eyes: Conjunctivae and EOM are normal. Pupils are equal, round, and reactive to light.  Neck: Normal range of motion.  Cardiovascular: Normal rate, regular rhythm, normal heart sounds and intact distal pulses.   Pulmonary/Chest: Effort normal and breath sounds normal.  Abdominal: Soft.  Musculoskeletal: Normal range of motion.  Neurological: He is alert and oriented to person, place, and time. He has normal reflexes.  Skin: Skin is warm and  dry. Capillary refill takes less than 2 seconds.  Psychiatric: He has a normal mood and affect.  Nursing note and vitals reviewed.    ED Treatments / Results  Labs (all labs ordered are listed, but only abnormal results are displayed) Labs Reviewed  CBC  BASIC METABOLIC PANEL  I-STAT [place], ED    EKG  EKG Interpretation  Date/Time:  Saturday May 02 2016 16:36:27 EDT Ventricular Rate:  78 PR Interval:    QRS Duration: 95 QT Interval:  381 QTC Calculation: 434 R Axis:   25 Text Interpretation:  Sinus rhythm No significant change since  last tracing 04/04/14 Confirmed by Othmar Ringer MD, Andee Poles [phone]) on 05/02/2016 4:53:33 PM       Radiology Dg Chest 2 View  Result Date: 05/02/2016 CLINICAL DATA:  Acute onset of generalized chest pain. Initial encounter. EXAM: CHEST  2 VIEW COMPARISON:  Chest radiograph performed 09/28/2012 FINDINGS: The lungs are well-aerated. Minimal right basilar and left midlung opacities could reflect atelectasis or possibly mild infection. There is no evidence of pleural effusion or pneumothorax. The heart is normal in size; the mediastinal contour is within normal limits. No acute osseous abnormalities are seen. IMPRESSION: Minimal right basilar and left midlung opacities could reflect atelectasis or possibly mild infection. Electronically Signed   By: Garald Balding M.D.   On: 05/02/2016 18:15    Procedures Procedures (including critical care time)  Medications Ordered in ED Medications  aspirin chewable tablet 324 mg (324 mg Oral Given 05/02/16 1728)     Initial Impression / Assessment and Plan / ED Course  I have reviewed the triage vital signs and the nursing notes.  Pertinent labs & imaging results that were available during my care of the patient were reviewed by me and considered in my medical decision making (see chart for details).  Clinical Course    6:19 PM Patient states feels fine.  Laughing and joking with wife and niece at  bedside.   1- chest pain- Given patient's history of mi, pain similar to previous mi, and no recent evaluation.  Plan admission for monitoring, serial enzymes, and further diagnostic testing.  - history of prior stenting to LAD - nuclear stress 03/2014 with no ischemia. LVEF 47%.        2. COPD- lung opacities on cxr- will treat with levaquin - PFTs 10/2015 with moderate COPD - recently started on spiriva and albuterol with improved symptoms.      3. Hyperlipidemia - reports upcoming labs with pcp. Has been on zocor for several years     Final Clinical Impressions(s) / ED Diagnoses   Final diagnoses:  Chest pain, unspecified chest pain type    New Prescriptions New Prescriptions   No medications on file     Pattricia Boss, MD 05/04/16 1539

## 2016-05-02 NOTE — ED Notes (Signed)
Pt returned from xray/CT/MRI

## 2016-05-02 NOTE — H&P (Signed)
TRH H&P   Patient Demographics:    Jeremy [name], is a 80 y.o. male  MRN: [id number]  DOB - March 07, 1936  Admit Date - 05/02/2016  Outpatient Primary MD for the patient is Jani Gravel, MD  Referring MD/NP/PA: Dr. Jeanell Sparrow  Patient coming from: Home  Chief Complaint  Patient presents with  . Chest Pain      HPI:    Jeremy [name]  is a 80 y.o. male, with history of CAD, status post*stents to LAD in 1996, nuclear study in 2015 showed ejection fraction of 47% with no ischemia, but it came to the ED with chest pain that started after patient mowed the lawn. Patient says that he slept after mowing, and when he woke up he felt chest pressure. He denies shortness of breath. No nausea vomiting. Patient took nitroglycerin and the chest pain is off. At this time he is chest pain-free In the ED EKG showed no significant ST-T changes. Patient admits to coughing up phlegm Chest x-ray showed minimal right basilar and left medial opacities could reflect atelectasis or possible mild infection   Review of systems:    In addition to the HPI above, * No Fever-chills, No Headache, No changes with Vision or hearing, No problems swallowing food or Liquids, No Abdominal pain, No Nausea or Vomiting, bowel movements are regular, No Blood in stool or Urine, No dysuria, No new skin rashes or bruises, No new joints pains-aches,  No new weakness, tingling, numbness in any extremity, No recent weight gain or loss, No polyuria, polydypsia or polyphagia, No significant Mental Stressors.  A full 10 point Review of Systems was done, except as stated above, all other Review of Systems were negative.   With Past History of the following :    Past Medical History:  Diagnosis Date  . Abnormal CXR 4/20011   Chest  CT mode/serere COPD ( no mediastinal abnormality)  . CAD (coronary artery disease) 2005   anterior MI with stent to the  LAD in 1991 / stent 1996 / nuclear 2005, no ischemia  . Carotid bruit    Doppler, June, 2013, 0-39% bilateral  . COPD (chronic obstructive pulmonary disease) ([place])   . Ejection fraction    55-60%, echo, April, 2011, could not estimate RV pressure  . HOH (hard of hearing)   . Hyperlipidemia   . Hypertension   . Kidney stone   . Myocardial infarction ([place])   . Shortness of breath    with exertion  . Sleep apnea    Stop Bang score of 5  . Tobacco abuse       Past Surgical History:  Procedure Laterality Date  . APPENDECTOMY    . CARDIAC CATHETERIZATION    . CATARACT EXTRACTION Left   . CATARACT EXTRACTION W/PHACO Right 01/22/2014   Procedure: CATARACT EXTRACTION PHACO AND INTRAOCULAR LENS PLACEMENT (IOC);  Surgeon: Tonny Branch, MD;  Location: [facility];  Service: Ophthalmology;  Laterality: Right;  CDE 7.46  . CHOLECYSTECTOMY     8 years  ago [facility]  . COLONOSCOPY  10/05/2012   Dr. Laural Golden: multiple diverticula at sigmoid colon, multiple polyps (tubular adenomas), small external hemorrhoids  . CYST REMOVAL HAND     right wrist  . ESOPHAGEAL DILATION N/A 05/23/2015   Procedure: ESOPHAGEAL DILATION;  Surgeon: Daneil Dolin, MD;  Location: [facility];  Service: Endoscopy;  Laterality: N/A;  . ESOPHAGOGASTRODUODENOSCOPY N/A 05/23/2015   Dr. Gala Romney: Ulcerative/ erosive reflux esophagitis with peptic stricture formation. Status post dilation as described. Hiatal hernia. Abnormal gastric mucosa of uncertain significance status post gastric biospy. +H.pylori  . PERCUTANEOUS STENT INTERVENTION    . SKIN CANCER EXCISION  2015   ear & neck  . TONSILLECTOMY        Social History:     Social History  Substance Use Topics  . Smoking status: Former Smoker    Packs/day: 3.00    Years: 12.00    Types: Cigarettes    Start date: 09/15/1983    Quit date: 09/14/1994  . Smokeless tobacco: Current User    Types: Chew     Comment: chews 80% of a pack tobacco per day -- 10/12 doesnt chew too much  .  Alcohol use No        Family History :     Family History  Problem Relation Age of Onset  . Heart disease Mother   . Heart disease Father   . Heart disease Brother   . Stomach cancer Brother   . Colon cancer Neg Hx       Home Medications:   Prior to Admission medications   Medication Sig Start Date End Date Taking? Authorizing Provider  albuterol (PROVENTIL HFA;VENTOLIN HFA) 108 (90 Base) MCG/ACT inhaler Inhale 2 puffs into the lungs every 6 (six) hours as needed for wheezing or shortness of breath. 10/18/15  Yes Arnoldo Lenis, MD  aspirin 81 MG tablet Take 81 mg by mouth every evening.    Yes Historical Provider, MD  Aspirin-Salicylamide-Caffeine (BC HEADACHE) 325-95-16 MG TABS Take 1 packet by mouth daily as needed (for pain).   Yes Historical Provider, MD  nitroGLYCERIN (NITROSTAT) 0.4 MG SL tablet Place 1 tablet (0.4 mg total) under the tongue every 5 (five) minutes x 3 doses as needed. 06/25/14  Yes Carlena Bjornstad, MD  simvastatin (ZOCOR) 40 MG tablet TAKE 1 TABLET BY MOUTH EVERY EVENING 04/16/16  Yes Arnoldo Lenis, MD  tiotropium (SPIRIVA HANDIHALER) 18 MCG inhalation capsule Place 1 capsule (18 mcg total) into inhaler and inhale daily. Patient taking differently: Place 18 mcg into inhaler and inhale daily as needed (for shortness of breath).  11/11/15   Arnoldo Lenis, MD     Allergies:    No Known Allergies   Physical Exam:   Vitals  Blood pressure 124/76, pulse (!) 56, temperature 98.1 F (36.7 C), temperature source Oral, resp. rate 18, height 6' (1.829 m), weight 86.2 kg (190 lb), SpO2 96 %.   1. General Caucasian male* lying in bed in NAD, cooperative with exam  2. Normal affect and insight, Awake Alert, Oriented X 3.  3. No F.N deficits, ALL C.Nerves Intact, Strength 5/5 all 4 extremities, Sensation intact all 4 extremities, Plantars down going.  4. Ears and Eyes appear Normal, Conjunctivae clear, PERRLA. Moist Oral Mucosa.  5. Supple Neck, No  JVD, No cervical lymphadenopathy appriciated, No Carotid Bruits.  6. Symmetrical Chest wall movement, Good air movement bilaterally, CTAB.  7. RRR, No Gallops, Rubs or Murmurs, No Parasternal Heave.No  Leg edema  8. Positive Bowel Sounds, Abdomen Soft, No tenderness, No organomegaly appriciated,No rebound -guarding or rigidity.  9.  No Cyanosis, Normal Skin Turgor, No Skin Rash or Bruise.  10. Good muscle tone,  joints appear normal , no effusions, Normal ROM.      Data Review:    CBC  Recent Labs Lab 05/02/16 1711  WBC 7.1  HGB 15.8  HCT 46.2  PLT 188  MCV 88.2  MCH 30.2  MCHC 34.2  RDW 13.7   ------------------------------------------------------------------------------------------------------------------  Chemistries   Recent Labs Lab 05/02/16 1711  NA 142  K 3.6  CL 107  CO2 30  GLUCOSE 91  BUN 17  CREATININE 1.13  CALCIUM 9.0   ------------------------------------------------------------------------------------------------------------------  ------------------------------------------------------------------------------------------------------------------  --------------------------------------------------------------------------------------------------------------- Urine analysis:    Component Value Date/Time   COLORURINE YELLOW 10/22/2013 0820   APPEARANCEUR CLOUDY (A) 10/22/2013 0820   LABSPEC >1.030 (H) 10/22/2013 0820   PHURINE 5.0 10/22/2013 0820   GLUCOSEU NEGATIVE 10/22/2013 0820   HGBUR LARGE (A) 10/22/2013 0820   BILIRUBINUR SMALL (A) 10/22/2013 0820   KETONESUR NEGATIVE 10/22/2013 0820   PROTEINUR TRACE (A) 10/22/2013 0820   UROBILINOGEN 0.2 10/22/2013 0820   NITRITE NEGATIVE 10/22/2013 0820   LEUKOCYTESUR NEGATIVE 10/22/2013 0820      ----------------------------------------------------------------------------------------------------------------   Imaging Results:    Dg Chest 2 View  Result Date: 05/02/2016 CLINICAL DATA:   Acute onset of generalized chest pain. Initial encounter. EXAM: CHEST  2 VIEW COMPARISON:  Chest radiograph performed 09/28/2012 FINDINGS: The lungs are well-aerated. Minimal right basilar and left midlung opacities could reflect atelectasis or possibly mild infection. There is no evidence of pleural effusion or pneumothorax. The heart is normal in size; the mediastinal contour is within normal limits. No acute osseous abnormalities are seen. IMPRESSION: Minimal right basilar and left midlung opacities could reflect atelectasis or possibly mild infection. Electronically Signed   By: Garald Balding M.D.   On: 05/02/2016 18:15    My personal review of EKG: Rhythm NSR   Assessment & Plan:    Active Problems:   Hypertension   Hyperlipidemia   CAD (coronary artery disease)   Chest pain   1. Chest pain- admit the patient to telemetry to rule out ACS, cycle cardiac enzymes, morphine when necessary for pain. 2. Coronary artery disease- status post stents to LAD, continue aspirin 3. Hyperlipidemia-continue Zocor 4. ? Pneumonia/acute bronchitis-start Levaquin   DVT Prophylaxis-   Lovenox   AM Labs Ordered, also please review Full Orders  Family Communication: Admission, patients condition and plan of care including tests being ordered have been discussed with the patient and *his wife at bedside who indicate understanding and agree with the plan and Code Status.  Code Status:  Full code  Admission status: Observation    Time spent in minutes : 55 minutes   Genesis Paget S M.D on 05/02/2016 at 7:24 PM  Between 7am to 7pm - Pager - [phone]. After 7pm go to [email or web address] - password [facility]  [facility] - Office  [phone]

## 2016-05-03 DIAGNOSIS — R0789 Other chest pain: Secondary | ICD-10-CM | POA: Diagnosis not present

## 2016-05-03 DIAGNOSIS — K210 Gastro-esophageal reflux disease with esophagitis: Secondary | ICD-10-CM

## 2016-05-03 DIAGNOSIS — R072 Precordial pain: Secondary | ICD-10-CM | POA: Diagnosis not present

## 2016-05-03 DIAGNOSIS — E785 Hyperlipidemia, unspecified: Secondary | ICD-10-CM

## 2016-05-03 DIAGNOSIS — I10 Essential (primary) hypertension: Secondary | ICD-10-CM | POA: Diagnosis not present

## 2016-05-03 LAB — TROPONIN I: Troponin I: <0.03 ng/mL (ref <0.03)

## 2016-05-03 MED ORDER — TIOTROPIUM BROMIDE MONOHYDRATE 18 MCG IN CAPS: 18.0000 ug | ORAL_CAPSULE | Freq: Every day | RESPIRATORY_TRACT | Status: DC

## 2016-05-03 MED ORDER — TIOTROPIUM BROMIDE MONOHYDRATE 18 MCG IN CAPS
18.0000 ug | ORAL_CAPSULE | Freq: Every day | RESPIRATORY_TRACT | Status: DC
  Filled 2016-05-03: qty 5

## 2016-05-03 NOTE — Discharge Summary (Signed)
Physician Discharge Summary  Jeremy [name] [id number] DOB: 1935-09-30 DOA: 05/02/2016  PCP: Jani Gravel, MD  Admit date: 05/02/2016 Discharge date: 05/03/2016  Admitted From: Home Disposition:  Home  Recommendations for Outpatient Follow-up:  1. Follow up with PCP in 2-3 weeks  Discharge Condition:Stable, improved CODE STATUS:Full Diet recommendation: Heart healthy   Brief/Interim Summary: 80 y.o. male, with history of CAD, status post*stents to LAD in 1996, nuclear study in 2015 showed ejection fraction of 47% with no ischemia, but it came to the ED with chest pain that started after patient mowed the lawn. Patient says that he slept after mowing, and when he woke up he felt chest pressure. He denies shortness of breath. No nausea vomiting. Patient took nitroglycerin and the chest pain is off. At this time he is chest pain-free In the ED EKG showed no significant ST-T changes. Patient admits to coughing up phlegm Chest x-ray showed minimal right basilar and left medial opacities could reflect atelectasis or possible mild infection  Hospital course Patient was admitted to the floor. Troponins were found to be serially negative 4. During this admission, patient reportedly had a large belching episode which spontaneously resolved chest pain. On further questioning, patient has a history of significant reflux disease and prior history of esophageal narrowing requiring dilatation. Patient's family states that the patient should be taking scheduled PPI, however has not been taking this at home. By the following morning, patient remained medically stable, symptom free, and very eager to go home. Chest x-ray reviewed with suspicion for atelectasis versus early pneumonia. Patient remained without leukocytosis and remained afebrile. Patient also reports no recent worsening cough or sputum production. As such, pneumonia is unlikely and further antibiotics will be discontinued for now. Patient has been  reminded to take his PPI as originally recommended. Patient otherwise clear for discharge with close outpatient follow-up with his PCP.  Discharge Diagnoses:  Principal Problem:   Reflux esophagitis Active Problems:   Hypertension   Hyperlipidemia   CAD (coronary artery disease)   Chest pain    Discharge Instructions     Medication List    TAKE these medications   albuterol 108 (90 Base) MCG/ACT inhaler Commonly known as:  PROVENTIL HFA;VENTOLIN HFA Inhale 2 puffs into the lungs every 6 (six) hours as needed for wheezing or shortness of breath.   aspirin 81 MG tablet Take 81 mg by mouth every evening.   BC HEADACHE 325-95-16 MG Tabs Generic drug:  Aspirin-Salicylamide-Caffeine Take 1 packet by mouth daily as needed (for pain).   nitroGLYCERIN 0.4 MG SL tablet Commonly known as:  NITROSTAT Place 1 tablet (0.4 mg total) under the tongue every 5 (five) minutes x 3 doses as needed.   simvastatin 40 MG tablet Commonly known as:  ZOCOR TAKE 1 TABLET BY MOUTH EVERY EVENING   tiotropium 18 MCG inhalation capsule Commonly known as:  SPIRIVA HANDIHALER Place 1 capsule (18 mcg total) into inhaler and inhale daily. What changed:  when to take this  reasons to take this      Follow-up Information    Jani Gravel, MD. Schedule an appointment as soon as possible for a visit in 2 week(s).   Specialty:  Internal Medicine Contact information: [place] [address] [phone]          No Known Allergies   Procedures/Studies: Dg Chest 2 View  Result Date: 05/02/2016 CLINICAL DATA:  Acute onset of generalized chest pain. Initial encounter. EXAM: CHEST  2 VIEW COMPARISON:  Chest radiograph performed 09/28/2012 FINDINGS: The lungs are well-aerated. Minimal right basilar and left midlung opacities could reflect atelectasis or possibly mild infection. There is no evidence of pleural effusion or pneumothorax. The heart is normal in size; the mediastinal contour  is within normal limits. No acute osseous abnormalities are seen. IMPRESSION: Minimal right basilar and left midlung opacities could reflect atelectasis or possibly mild infection. Electronically Signed   By: Garald Balding M.D.   On: 05/02/2016 18:15    Subjective: No complaints. Denies chest pains. Very eager to go home.  Discharge Exam: Vitals:   05/03/16 0030 05/03/16 0400  BP: 125/77 124/75  Pulse: 67 61  Resp: 18   Temp: 97.8 F (36.6 C) 97.8 F (36.6 C)   Vitals:   05/02/16 2212 05/03/16 0030 05/03/16 0400 05/03/16 0745  BP:  125/77 124/75   Pulse:  67 61   Resp:  18    Temp:  97.8 F (36.6 C) 97.8 F (36.6 C)   TempSrc:  Oral Oral   SpO2: 94% 96% 97% 94%  Weight:      Height:        General: Pt is alert, awake, not in acute distress Cardiovascular: RRR, S1/S2 +, no rubs, no gallops Respiratory: CTA bilaterally, no wheezing, no rhonchi Abdominal: Soft, NT, ND, bowel sounds + Extremities: no edema, no cyanosis   The results of significant diagnostics from this hospitalization (including imaging, microbiology, ancillary and laboratory) are listed below for reference.     Microbiology: No results found for this or any previous visit (from the past 240 hour(s)).   Labs: BNP (last 3 results) No results for input(s): BNP in the last 8760 hours. Basic Metabolic Panel:  Recent Labs Lab 05/02/16 1711  NA 142  K 3.6  CL 107  CO2 30  GLUCOSE 91  BUN 17  CREATININE 1.13  CALCIUM 9.0   Liver Function Tests: No results for input(s): AST, ALT, ALKPHOS, BILITOT, PROT, ALBUMIN in the last 168 hours. No results for input(s): LIPASE, AMYLASE in the last 168 hours. No results for input(s): AMMONIA in the last 168 hours. CBC:  Recent Labs Lab 05/02/16 1711  WBC 7.1  HGB 15.8  HCT 46.2  MCV 88.2  PLT 188   Cardiac Enzymes:  Recent Labs Lab 05/02/16 2133 05/03/16 0308 05/03/16 0644  TROPONINI <0.03 <0.03 <0.03   BNP: Invalid input(s):  POCBNP CBG: No results for input(s): GLUCAP in the last 168 hours. D-Dimer No results for input(s): DDIMER in the last 72 hours. Hgb A1c No results for input(s): HGBA1C in the last 72 hours. Lipid Profile No results for input(s): CHOL, HDL, LDLCALC, TRIG, CHOLHDL, LDLDIRECT in the last 72 hours. Thyroid function studies No results for input(s): TSH, T4TOTAL, T3FREE, THYROIDAB in the last 72 hours.  Invalid input(s): FREET3 Anemia work up No results for input(s): VITAMINB12, FOLATE, FERRITIN, TIBC, IRON, RETICCTPCT in the last 72 hours. Urinalysis    Component Value Date/Time   COLORURINE YELLOW 10/22/2013 0820   APPEARANCEUR CLOUDY (A) 10/22/2013 0820   LABSPEC >1.030 (H) 10/22/2013 0820   PHURINE 5.0 10/22/2013 0820   GLUCOSEU NEGATIVE 10/22/2013 0820   HGBUR LARGE (A) 10/22/2013 0820   BILIRUBINUR SMALL (A) 10/22/2013 0820   KETONESUR NEGATIVE 10/22/2013 0820   PROTEINUR TRACE (A) 10/22/2013 0820   UROBILINOGEN 0.2 10/22/2013 0820   NITRITE NEGATIVE 10/22/2013 0820   LEUKOCYTESUR NEGATIVE 10/22/2013 0820   Sepsis Labs Invalid input(s): PROCALCITONIN,  WBC,  LACTICIDVEN Microbiology No results found  for this or any previous visit (from the past 240 hour(s)).   SIGNED:   Donne Hazel, MD  [facility] 05/03/2016, 11:22 AM  If 7PM-7AM, please contact night-coverage [email or web address] Password [email or web address]

## 2016-05-03 NOTE — Progress Notes (Signed)
IV discontinued, pt. Discharged home with discharge instructions.

## 2016-05-03 NOTE — Care Management Obs Status (Signed)
[place] NOTIFICATION   Patient Details  Name: Jeremy [name] MRN: [id number] Date of Birth: 12/07/1935   Medicare Observation Status Notification Given:  Yes    Briant Sites, RN 05/03/2016, 11:51 AM

## 2016-05-20 ENCOUNTER — Encounter: Payer: Self-pay | Admitting: Physician Assistant

## 2016-05-20 ENCOUNTER — Ambulatory Visit (INDEPENDENT_AMBULATORY_CARE_PROVIDER_SITE_OTHER): Payer: Medicare Other | Admitting: Physician Assistant

## 2016-05-20 VITALS — BP 130/78 | HR 82 | Ht 72.0 in | Wt 186.0 lb

## 2016-05-20 DIAGNOSIS — E785 Hyperlipidemia, unspecified: Secondary | ICD-10-CM

## 2016-05-20 DIAGNOSIS — I2510 Atherosclerotic heart disease of native coronary artery without angina pectoris: Secondary | ICD-10-CM | POA: Diagnosis not present

## 2016-05-20 DIAGNOSIS — I10 Essential (primary) hypertension: Secondary | ICD-10-CM

## 2016-05-20 NOTE — Patient Instructions (Signed)
Medication Instructions:  Your physician recommends that you continue on your current medications as directed. Please refer to the Current Medication list given to you today.   Labwork: none  Testing/Procedures: none  Follow-Up: Your physician recommends that you schedule a follow-up appointment in: November with Dr. Harl Bowie   Any Other Special Instructions Will Be Listed Below (If Applicable).     If you need a refill on your cardiac medications before your next appointment, please call your pharmacy.

## 2016-05-20 NOTE — Progress Notes (Signed)
Cardiology Office Note    Date:  05/20/2016   ID:  Jeremy [name], DOB [birth year], MRN [id number]  PCP:  Jani Gravel, MD  Cardiologist: Dr. Harl Bowie   No chief complaint on file.   History of Present Illness:  Jeremy [name] is a 80 y.o. male  with history of CAD status post stenting of the LAD in 1996, nuclear stress 03/2014 with no ischemia LVEF 47%. Also has COPD and hyperlipidemia. Last seen by Dr. Harl Bowie 01/13/16 and doing well. Patient was admitted to the hospital 05/02/16 with chest pain and had negative troponins. EKG was without change in the chest pain was relieved with belching.History of GERD and erosive esophagitis He was not seen by cardiology.  Patient comes in today accompanied by his wife. He says he had not been taking his Protonix and was having a lot of GI symptoms. Once he started this back his chest pains and GI symptoms greatly improved. 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 He denies any exertional chest pain, palpitations, dyspnea, dizziness or presyncope. He has chronic dyspnea on exertion.    Past Medical History:  Diagnosis Date  . Abnormal CXR 4/20011   Chest  CT mode/serere COPD ( no mediastinal abnormality)  . CAD (coronary artery disease) 2005   anterior MI with stent to the LAD in 1991 / stent 1996 / nuclear 2005, no ischemia  . Carotid bruit    Doppler, June, 2013, 0-39% bilateral  . COPD (chronic obstructive pulmonary disease) ([place])   . Ejection fraction    55-60%, echo, April, 2011, could not estimate RV pressure  . HOH (hard of hearing)   . Hyperlipidemia   . Hypertension   . Kidney stone   . Myocardial infarction ([place])   . Shortness of breath    with exertion  . Sleep apnea    Stop Bang score of 5  . Tobacco abuse     Past Surgical History:  Procedure Laterality Date  . APPENDECTOMY    . CARDIAC CATHETERIZATION    . CATARACT EXTRACTION Left   . CATARACT EXTRACTION W/PHACO Right 01/22/2014   Procedure: CATARACT  EXTRACTION PHACO AND INTRAOCULAR LENS PLACEMENT (IOC);  Surgeon: Tonny Branch, MD;  Location: [facility];  Service: Ophthalmology;  Laterality: Right;  CDE 7.46  . CHOLECYSTECTOMY     8 years ago [facility]  . COLONOSCOPY  10/05/2012   Dr. Laural Golden: multiple diverticula at sigmoid colon, multiple polyps (tubular adenomas), small external hemorrhoids  . CYST REMOVAL HAND     right wrist  . ESOPHAGEAL DILATION N/A 05/23/2015   Procedure: ESOPHAGEAL DILATION;  Surgeon: Daneil Dolin, MD;  Location: [facility];  Service: Endoscopy;  Laterality: N/A;  . ESOPHAGOGASTRODUODENOSCOPY N/A 05/23/2015   Dr. Gala Romney: Ulcerative/ erosive reflux esophagitis with peptic stricture formation. Status post dilation as described. Hiatal hernia. Abnormal gastric mucosa of uncertain significance status post gastric biospy. +H.pylori  . PERCUTANEOUS STENT INTERVENTION    . SKIN CANCER EXCISION  2015   ear & neck  . TONSILLECTOMY      Current Medications: Outpatient Medications Prior to Visit  Medication Sig Dispense Refill  . aspirin 81 MG tablet Take 81 mg by mouth every evening.     . nitroGLYCERIN (NITROSTAT) 0.4 MG SL tablet Place 1 tablet (0.4 mg total) under the tongue every 5 (five) minutes x 3 doses as needed. 25 tablet 3  . simvastatin (ZOCOR) 40 MG tablet TAKE 1 TABLET  BY MOUTH EVERY EVENING 30 tablet 8  . tiotropium (SPIRIVA HANDIHALER) 18 MCG inhalation capsule Place 1 capsule (18 mcg total) into inhaler and inhale daily. (Patient taking differently: Place 18 mcg into inhaler and inhale daily as needed (for shortness of breath). ) 30 capsule 12  . albuterol (PROVENTIL HFA;VENTOLIN HFA) 108 (90 Base) MCG/ACT inhaler Inhale 2 puffs into the lungs every 6 (six) hours as needed for wheezing or shortness of breath. 1 Inhaler 3  . Aspirin-Salicylamide-Caffeine (BC HEADACHE) 325-95-16 MG TABS Take 1 packet by mouth daily as needed (for pain).     No facility-administered medications prior to visit.      Allergies:   Review  of patient's allergies indicates no known allergies.   Social History   Social History  . Marital status: Married    Spouse name: N/A  . Number of children: N/A  . Years of education: N/A   Social History Main Topics  . Smoking status: Former Smoker    Packs/day: 3.00    Years: 12.00    Types: Cigarettes    Start date: 09/15/1983    Quit date: 09/14/1994  . Smokeless tobacco: Current User    Types: Chew     Comment: chews 80% of a pack tobacco per day -- 10/12 doesnt chew too much  . Alcohol use No  . Drug use: No  . Sexual activity: Yes    Partners: Female   Other Topics Concern  . None   Social History Narrative  . None     Family History:  The patient's   family history includes Heart disease in his brother, father, and mother; Stomach cancer in his brother.   ROS:   Please see the history of present illness.    Review of Systems  Constitution: Negative.  HENT: Positive for hearing loss.   Cardiovascular: Negative.   Respiratory: Negative.   Endocrine: Negative.   Hematologic/Lymphatic: Bruises/bleeds easily.  Musculoskeletal: Negative.   Gastrointestinal: Negative.   Genitourinary: Negative.   Neurological: Negative.    All other systems reviewed and are negative.   PHYSICAL EXAM:   VS:  BP 130/78   Pulse 82   Ht 6' (1.829 m)   Wt 186 lb (84.4 kg)   SpO2 91%   BMI 25.23 kg/m   Physical Exam  GEN: Well nourished, well developed, in no acute distress  Neck: no JVD, carotid bruits, or masses Cardiac:RRR; S4, no murmurs, rubs Respiratory:  clear to auscultation bilaterally, normal work of breathing GI: soft, nontender, nondistended, + BS Ext: without cyanosis, clubbing, or edema, Good distal pulses bilaterally MS: no deformity or atrophy  Skin: warm and dry, no rash Psych: euthymic mood, full affect  Wt Readings from Last 3 Encounters:  05/20/16 186 lb (84.4 kg)  05/02/16 181 lb 11.2 oz (82.4 kg)  01/13/16 188 lb (85.3 kg)      Studies/Labs  Reviewed:   EKG:  EKG is  ordered today.  The ekg ordered today demonstrates NSR with old ant MI no acute change  Recent Labs: 05/02/2016: BUN 17; Creatinine, Ser 1.13; Hemoglobin 15.8; Platelets 188; Potassium 3.6; Sodium 142   Lipid Panel No results found for: CHOL, TRIG, HDL, CHOLHDL, VLDL, LDLCALC, LDLDIRECT  Additional studies/ records that were reviewed today include:  Endoscopy 05/2015 ENDOSCOPIC IMPRESSION: Ulcerative / erosive reflux esophagitis with peptic stricture formation. Status post dilation as described. Hiatal hernia. Abnormal gastric mucosa of uncertain significance-status post gastric biopsy RECOMMENDATIONS: Protonix 40 mg twice daily. Follow up  on pathology. Patient should stop using tobacco products. Further recommendations to follow.  Carotid ultrasound 05/15/15 1-39% bilateral ICA stenosis   ASSESSMENT:    1. Coronary artery disease involving native coronary artery of native heart without angina pectoris   2. Essential hypertension   3. Hyperlipidemia      PLAN:  In order of problems listed above:  CAD  status post stenting of the LAD in 1996, nuclear stress 03/2014 with no ischemia LVEF 47%. Recent hospitalization for chest pain, Troponins negative and symptoms improved With Protonix. No exertional symptoms at this time. Continue current medications. Follow-up with Dr.Branch in November.  Essential hypertension controlled  Hyperlipidemia continue Zocor    Medication Adjustments/Labs and Tests Ordered: Current medicines are reviewed at length with the patient today.  Concerns regarding medicines are outlined above.  Medication changes, Labs and Tests ordered today are listed in the Patient Instructions below. Patient Instructions  Medication Instructions:  Your physician recommends that you continue on your current medications as directed. Please refer to the Current Medication list given to you  today.   Labwork: none  Testing/Procedures: none  Follow-Up: Your physician recommends that you schedule a follow-up appointment in: November with Dr. Harl Bowie   Any Other Special Instructions Will Be Listed Below (If Applicable).     If you need a refill on your cardiac medications before your next appointment, please call your pharmacy.      Signed, Ermalinda Barrios, PA-C  05/20/2016 1:48 PM    [facility] [address], [place], [place]  [address] Phone: [phone]; Fax: [phone]

## 2016-08-06 ENCOUNTER — Other Ambulatory Visit: Payer: Self-pay | Admitting: Gastroenterology

## 2016-08-11 ENCOUNTER — Other Ambulatory Visit: Payer: Self-pay | Admitting: Cardiology

## 2016-08-11 ENCOUNTER — Ambulatory Visit (INDEPENDENT_AMBULATORY_CARE_PROVIDER_SITE_OTHER): Payer: Medicare Other | Admitting: Cardiology

## 2016-08-11 ENCOUNTER — Encounter: Payer: Self-pay | Admitting: Cardiology

## 2016-08-11 VITALS — BP 111/68 | HR 68 | Ht 72.0 in | Wt 181.4 lb

## 2016-08-11 DIAGNOSIS — R7309 Other abnormal glucose: Secondary | ICD-10-CM | POA: Diagnosis not present

## 2016-08-11 DIAGNOSIS — I2510 Atherosclerotic heart disease of native coronary artery without angina pectoris: Secondary | ICD-10-CM | POA: Diagnosis not present

## 2016-08-11 DIAGNOSIS — Z136 Encounter for screening for cardiovascular disorders: Secondary | ICD-10-CM | POA: Diagnosis not present

## 2016-08-11 DIAGNOSIS — E785 Hyperlipidemia, unspecified: Secondary | ICD-10-CM

## 2016-08-11 MED ORDER — NITROGLYCERIN 0.4 MG SL SUBL: 0.4000 mg | SUBLINGUAL_TABLET | SUBLINGUAL | 3 refills | Status: DC | PRN

## 2016-08-11 NOTE — Progress Notes (Signed)
Clinical Summary Jeremy [name] is a 80 y.o.male seen for the following medical problems.   1. CAD - history of prior stenting to LAD - nuclear stress 03/2014 with no ischemia. LVEF 47%.  - admit 04/2016 with chest pain, negative EKG and enzymes. Symptoms better with belching, thought GI etiology. He had been off his protonix at the time. Symptoms improved after restarting.    - no recent chest pain. No SOB or DOE.   2. COPD - PFTs 10/2015 with moderate COPD - recently started on spiriva and albuterol with improved symptoms.  - denies any recent SOB/DOE.    3. 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    Past Medical History:  Diagnosis Date  . Abnormal CXR 4/20011   Chest  CT mode/serere COPD ( no mediastinal abnormality)  . CAD (coronary artery disease) 2005   anterior MI with stent to the LAD in 1991 / stent 1996 / nuclear 2005, no ischemia  . Carotid bruit    Doppler, June, 2013, 0-39% bilateral  . COPD (chronic obstructive pulmonary disease) ([place])   . Ejection fraction    55-60%, echo, April, 2011, could not estimate RV pressure  . HOH (hard of hearing)   . Hyperlipidemia   . Hypertension   . Kidney stone   . Myocardial infarction   . Shortness of breath    with exertion  . Sleep apnea    Stop Bang score of 5  . Tobacco abuse      No Known Allergies   Current Outpatient Prescriptions  Medication Sig Dispense Refill  . aspirin 81 MG tablet Take 81 mg by mouth every evening.     . nitroGLYCERIN (NITROSTAT) 0.4 MG SL tablet Place 1 tablet (0.4 mg total) under the tongue every 5 (five) minutes x 3 doses as needed. 25 tablet 3  . pantoprazole (PROTONIX) 40 MG tablet     . simvastatin (ZOCOR) 40 MG tablet TAKE 1 TABLET BY MOUTH EVERY EVENING 30 tablet 8  . tiotropium (SPIRIVA HANDIHALER) 18 MCG inhalation capsule Place 1 capsule (18 mcg total) into inhaler and inhale daily.  (Patient taking differently: Place 18 mcg into inhaler and inhale daily as needed (for shortness of breath). ) 30 capsule 12   No current facility-administered medications for this visit.      Past Surgical History:  Procedure Laterality Date  . APPENDECTOMY    . CARDIAC CATHETERIZATION    . CATARACT EXTRACTION Left   . CATARACT EXTRACTION W/PHACO Right 01/22/2014   Procedure: CATARACT EXTRACTION PHACO AND INTRAOCULAR LENS PLACEMENT (IOC);  Surgeon: Tonny Branch, MD;  Location: [facility];  Service: Ophthalmology;  Laterality: Right;  CDE 7.46  . CHOLECYSTECTOMY     8 years ago [facility]  . COLONOSCOPY  10/05/2012   Dr. Laural Golden: multiple diverticula at sigmoid colon, multiple polyps (tubular adenomas), small external hemorrhoids  . CYST REMOVAL HAND     right wrist  . ESOPHAGEAL DILATION N/A 05/23/2015   Procedure: ESOPHAGEAL DILATION;  Surgeon: Daneil Dolin, MD;  Location: [facility];  Service: Endoscopy;  Laterality: N/A;  . ESOPHAGOGASTRODUODENOSCOPY N/A 05/23/2015   Dr. Gala Romney: Ulcerative/ erosive reflux esophagitis with peptic stricture formation. Status post dilation as described. Hiatal hernia. Abnormal gastric mucosa of uncertain significance status post gastric biospy. +H.pylori  . PERCUTANEOUS STENT INTERVENTION    . SKIN CANCER EXCISION  2015   ear & neck  . TONSILLECTOMY       No Known Allergies    Family History  Problem Relation Age of Onset  . Heart disease Mother   . Heart disease Father   . Heart disease Brother   . Stomach cancer Brother   . Colon cancer Neg Hx      Social History Jeremy [name] reports that he quit smoking about 21 years ago. His smoking use included Cigarettes. He started smoking about 32 years ago. He has a 36.00 pack-year smoking history. His smokeless tobacco use includes Chew. Jeremy [name] reports that he does not drink alcohol.   Review of Systems CONSTITUTIONAL: No weight loss, fever, chills, weakness or fatigue.  HEENT: Eyes: No visual loss,  blurred vision, double vision or yellow sclerae.No hearing loss, sneezing, congestion, runny nose or sore throat.  SKIN: No rash or itching.  CARDIOVASCULAR: per HPI RESPIRATORY: No shortness of breath, cough or sputum.  GASTROINTESTINAL: No anorexia, nausea, vomiting or diarrhea. No abdominal pain or blood.  GENITOURINARY: No burning on urination, no polyuria NEUROLOGICAL: No headache, dizziness, syncope, paralysis, ataxia, numbness or tingling in the extremities. No change in bowel or bladder control.  MUSCULOSKELETAL: No muscle, back pain, joint pain or stiffness.  LYMPHATICS: No enlarged nodes. No history of splenectomy.  PSYCHIATRIC: No history of depression or anxiety.  ENDOCRINOLOGIC: No reports of sweating, cold or heat intolerance. No polyuria or polydipsia.  Marland Kitchen   Physical Examination Vitals:   08/11/16 1456  BP: 111/68  Pulse: 68   Vitals:   08/11/16 1456  Weight: 181 lb 6.4 oz (82.3 kg)  Height: 6' (1.829 m)    Gen: resting comfortably, no acute distress HEENT: no scleral icterus, pupils equal round and reactive, no palptable cervical adenopathy,  CV: RRR, no m/r/g, no jvd Resp: Clear to auscultation bilaterally GI: abdomen is soft, non-tender, non-distended, normal bowel sounds, no hepatosplenomegaly MSK: extremities are warm, no edema.  Skin: warm, no rash Neuro:  no focal deficits Psych: appropriate affect     Assessment and Plan  1. CAD - no current symptoms - continue current meds  2. COPD -  PFTs 10/2015 showed moderate COPD, symptoms improved since starting inhalers - continue current therapy  3. Hyperlipidemia - repeat lipid panel. Consider high dose statin in setting of known CAD, atrova 80mg   4. AAA screen - male over 63 with smoking history, order AAA screening US.    F/u 6 months    Arnoldo Lenis, M.D.

## 2016-08-11 NOTE — Patient Instructions (Signed)
Your physician wants you to follow-up in: [place] DR. BRANCH  You will receive a reminder letter in the mail two months in advance. If you don't receive a letter, please call our office to schedule the follow-up appointment.  Your physician recommends that you continue on your current medications as directed. Please refer to the Current Medication list given to you today.  Your physician has requested that you have an abdominal aorta duplex. During this test, an ultrasound is used to evaluate the aorta. Allow 30 minutes for this exam. Do not eat after midnight the day before and avoid carbonated beverages  Your physician recommends that you return for lab work LIPIDS/CBC/BMP/HGBA1C/MG/TSH - PLEASE FAST PRIOR TO LAB WORK  Thank you for choosing [facility]!!

## 2016-08-15 ENCOUNTER — Encounter (HOSPITAL_COMMUNITY): Payer: Self-pay | Admitting: Emergency Medicine

## 2016-08-15 ENCOUNTER — Emergency Department (HOSPITAL_COMMUNITY): Payer: Medicare Other

## 2016-08-15 ENCOUNTER — Emergency Department (HOSPITAL_COMMUNITY)
Admission: EM | Admit: 2016-08-15 | Discharge: 2016-08-15 | Disposition: A | Discharge status: Home / Self Care | Payer: Medicare Other | Attending: Emergency Medicine | Admitting: Emergency Medicine

## 2016-08-15 DIAGNOSIS — Z85828 Personal history of other malignant neoplasm of skin: Secondary | ICD-10-CM | POA: Insufficient documentation

## 2016-08-15 DIAGNOSIS — R109 Unspecified abdominal pain: Secondary | ICD-10-CM

## 2016-08-15 DIAGNOSIS — Z23 Encounter for immunization: Secondary | ICD-10-CM | POA: Diagnosis not present

## 2016-08-15 DIAGNOSIS — I2510 Atherosclerotic heart disease of native coronary artery without angina pectoris: Secondary | ICD-10-CM | POA: Insufficient documentation

## 2016-08-15 DIAGNOSIS — I10 Essential (primary) hypertension: Secondary | ICD-10-CM | POA: Diagnosis not present

## 2016-08-15 DIAGNOSIS — J449 Chronic obstructive pulmonary disease, unspecified: Secondary | ICD-10-CM | POA: Diagnosis not present

## 2016-08-15 DIAGNOSIS — Z79899 Other long term (current) drug therapy: Secondary | ICD-10-CM | POA: Insufficient documentation

## 2016-08-15 DIAGNOSIS — F17220 Nicotine dependence, chewing tobacco, uncomplicated: Secondary | ICD-10-CM | POA: Diagnosis not present

## 2016-08-15 DIAGNOSIS — R350 Frequency of micturition: Secondary | ICD-10-CM | POA: Insufficient documentation

## 2016-08-15 DIAGNOSIS — I252 Old myocardial infarction: Secondary | ICD-10-CM | POA: Insufficient documentation

## 2016-08-15 DIAGNOSIS — Z7982 Long term (current) use of aspirin: Secondary | ICD-10-CM | POA: Insufficient documentation

## 2016-08-15 DIAGNOSIS — Z791 Long term (current) use of non-steroidal anti-inflammatories (NSAID): Secondary | ICD-10-CM | POA: Insufficient documentation

## 2016-08-15 LAB — URINALYSIS, ROUTINE W REFLEX MICROSCOPIC
BILIRUBIN URINE: NEGATIVE
Glucose, UA: NEGATIVE mg/dL
HGB URINE DIPSTICK: NEGATIVE
KETONES UR: NEGATIVE mg/dL
Leukocytes, UA: NEGATIVE
NITRITE: NEGATIVE
PROTEIN: NEGATIVE mg/dL
Specific Gravity, Urine: 1.015 (ref 1.005–1.030)
pH: 7 (ref 5.0–8.0)

## 2016-08-15 MED ORDER — HYDROCODONE-ACETAMINOPHEN 5-325 MG PO TABS
1.0000 | ORAL_TABLET | Freq: Once | ORAL | Status: AC
  Administered 2016-08-15: 1 via ORAL
  Filled 2016-08-15: qty 1

## 2016-08-15 MED ORDER — METHOCARBAMOL 500 MG PO TABS: 500.0000 mg | ORAL_TABLET | Freq: Three times a day (TID) | ORAL | 0 refills | Status: DC | PRN

## 2016-08-15 MED ORDER — HYDROCODONE-ACETAMINOPHEN 5-325 MG PO TABS: 2.0000 | ORAL_TABLET | ORAL | 0 refills | Status: DC | PRN

## 2016-08-15 NOTE — ED Notes (Signed)
Pt returned from X-ray.  

## 2016-08-15 NOTE — ED Triage Notes (Signed)
Patient c/o right flank pain that started approx 3 days ago. Patient does reports some dysuria and frequency. Denies any fevers, nausea, vomiting, or blood in urine. Hx of kidney stones.

## 2016-08-15 NOTE — ED Provider Notes (Signed)
[facility] DEPT Provider Note   CSN: [id number] Arrival date & time: 08/15/16  1239     History   Chief Complaint Chief Complaint  Patient presents with  . Flank Pain    HPI Jeremy [name] is a 80 y.o. male.  HPI Patient presents with right flank pain over the last 3 days. States it feels like previous kidney stones. It is sharp. Has had some urinary frequency. Denies fevers. Denies nausea. Denies constipation or diarrhea. States that he almost came in last night but felt a little better after taking half of a hydrocodone. States he other night he urinated 17 times in 6 hours. No dysuria now. The pain is sharp in his back. Not worse with movements. The pain is similar to his stones in the past.   Past Medical History:  Diagnosis Date  . Abnormal CXR 4/20011   Chest  CT mode/serere COPD ( no mediastinal abnormality)  . CAD (coronary artery disease) 2005   anterior MI with stent to the LAD in 1991 / stent 1996 / nuclear 2005, no ischemia  . Carotid bruit    Doppler, June, 2013, 0-39% bilateral  . COPD (chronic obstructive pulmonary disease) ([place])   . Ejection fraction    55-60%, echo, April, 2011, could not estimate RV pressure  . HOH (hard of hearing)   . Hyperlipidemia   . Hypertension   . Kidney stone   . Myocardial infarction   . Shortness of breath    with exertion  . Sleep apnea    Stop Bang score of 5  . Tobacco abuse     Patient Active Problem List   Diagnosis Date Noted  . Chest pain 05/02/2016  . H. pylori infection 08/15/2015  . Mucosal abnormality of stomach   . Reflux esophagitis   . Peptic stricture of esophagus   . Rectal bleeding 05/21/2015  . Dyspepsia 05/21/2015  . Dysphagia, pharyngoesophageal phase 05/21/2015  . Sinus bradycardia 03/19/2014  . Carotid artery disease ([place]) 03/15/2014  . Hypertension   . Hyperlipidemia   . CAD (coronary artery disease)   . Abnormal CXR   . Ejection fraction   . Tobacco abuse   . COPD (chronic  obstructive pulmonary disease) ([place])     Past Surgical History:  Procedure Laterality Date  . APPENDECTOMY    . CARDIAC CATHETERIZATION    . CATARACT EXTRACTION Left   . CATARACT EXTRACTION W/PHACO Right 01/22/2014   Procedure: CATARACT EXTRACTION PHACO AND INTRAOCULAR LENS PLACEMENT (IOC);  Surgeon: Tonny Branch, MD;  Location: [facility];  Service: Ophthalmology;  Laterality: Right;  CDE 7.46  . CHOLECYSTECTOMY     8 years ago [facility]  . COLONOSCOPY  10/05/2012   Dr. Laural Golden: multiple diverticula at sigmoid colon, multiple polyps (tubular adenomas), small external hemorrhoids  . CYST REMOVAL HAND     right wrist  . ESOPHAGEAL DILATION N/A 05/23/2015   Procedure: ESOPHAGEAL DILATION;  Surgeon: Daneil Dolin, MD;  Location: [facility];  Service: Endoscopy;  Laterality: N/A;  . ESOPHAGOGASTRODUODENOSCOPY N/A 05/23/2015   Dr. Gala Romney: Ulcerative/ erosive reflux esophagitis with peptic stricture formation. Status post dilation as described. Hiatal hernia. Abnormal gastric mucosa of uncertain significance status post gastric biospy. +H.pylori  . PERCUTANEOUS STENT INTERVENTION    . SKIN CANCER EXCISION  2015   ear & neck  . TONSILLECTOMY         Home Medications    Prior to Admission medications   Medication Sig Start Date End Date  Taking? Authorizing Provider  albuterol (PROVENTIL HFA;VENTOLIN HFA) 108 (90 Base) MCG/ACT inhaler Inhale 1-2 puffs into the lungs every 6 (six) hours as needed for wheezing or shortness of breath.   Yes Historical Provider, MD  aspirin 81 MG tablet Take 81 mg by mouth every evening.    Yes Historical Provider, MD  aspirin-acetaminophen-caffeine (EXCEDRIN MIGRAINE) [phone] MG tablet Take 1 tablet by mouth every 6 (six) hours as needed (pain).   Yes Historical Provider, MD  Aspirin-Salicylamide-Caffeine (ARTHRITIS STRENGTH BC POWDER PO) Take 1 Package by mouth daily as needed (pain).   Yes Historical Provider, MD  ibuprofen (ADVIL,MOTRIN) 200 MG tablet Take 400 mg by  mouth every 6 (six) hours as needed for moderate pain.   Yes Historical Provider, MD  nitroGLYCERIN (NITROSTAT) 0.4 MG SL tablet PLACE 1 TABLET UNDER THE TONGUE EVERY 5 MINUTES FOR 3 DOSES AS NEEDED FOR CHEST PAIN 08/11/16  Yes Arnoldo Lenis, MD  pantoprazole (PROTONIX) 40 MG tablet TAKE 1 TABLET BY MOUTH DAILY 30 MINUTES BEFORE BREAKFAST 08/11/16  Yes Annitta Needs, NP  simvastatin (ZOCOR) 40 MG tablet TAKE 1 TABLET BY MOUTH EVERY EVENING 04/16/16  Yes Arnoldo Lenis, MD  tiotropium (SPIRIVA HANDIHALER) 18 MCG inhalation capsule Place 1 capsule (18 mcg total) into inhaler and inhale daily. Patient taking differently: Place 18 mcg into inhaler and inhale daily as needed (for shortness of breath).  11/11/15  Yes Arnoldo Lenis, MD  HYDROcodone-acetaminophen (NORCO/VICODIN) 5-325 MG tablet Take 2 tablets by mouth every 4 (four) hours as needed. 08/15/16   Davonna Belling, MD  methocarbamol (ROBAXIN) 500 MG tablet Take 1 tablet (500 mg total) by mouth every 8 (eight) hours as needed for muscle spasms. 08/15/16   Davonna Belling, MD    Family History Family History  Problem Relation Age of Onset  . Heart disease Mother   . Heart disease Father   . Heart disease Brother   . Stomach cancer Brother   . Colon cancer Neg Hx     Social History Social History  Substance Use Topics  . Smoking status: Former Smoker    Packs/day: 3.00    Years: 12.00    Types: Cigarettes    Start date: 09/15/1983    Quit date: 09/14/1994  . Smokeless tobacco: Current User    Types: Chew     Comment: chews 80% of a pack tobacco per day -- 10/12 doesnt chew too much  . Alcohol use No     Allergies   Patient has no known allergies.   Review of Systems Review of Systems  Constitutional: Negative for appetite change and fever.  HENT: Negative for congestion.   Respiratory: Negative for shortness of breath.   Cardiovascular: Negative for chest pain.  Gastrointestinal: Negative for abdominal pain.    Genitourinary: Positive for flank pain and frequency.  Musculoskeletal: Negative for back pain.  Skin: Negative for rash.  Neurological: Negative for headaches.  Hematological: Negative for adenopathy.  Psychiatric/Behavioral: Negative for confusion.     Physical Exam Updated Vital Signs BP 144/88 (BP Location: Right Arm)   Pulse (!) 57   Temp 98.4 F (36.9 C) (Oral)   Resp 16   Ht 6' (1.829 m)   Wt 181 lb 6.4 oz (82.3 kg)   SpO2 96%   BMI 24.60 kg/m   Physical Exam  Constitutional: He appears well-developed.  Patient appears uncomfortable with his right hand held to his right flank.  HENT:  Head: Normocephalic.  Neck: Neck supple.  Cardiovascular: Normal rate.   Pulmonary/Chest: Effort normal.  Abdominal: There is no tenderness.  Genitourinary:  Genitourinary Comments: No CVA tenderness.  Musculoskeletal: He exhibits no edema.  Neurological: He is alert.  Patient is somewhat hard of hearing.  Skin: Skin is warm.  Psychiatric: He has a normal mood and affect.     ED Treatments / Results  Labs (all labs ordered are listed, but only abnormal results are displayed) Labs Reviewed  URINALYSIS, ROUTINE W REFLEX MICROSCOPIC (NOT AT [facility])    EKG  EKG Interpretation None       Radiology Ct Renal Stone Study  Result Date: 08/15/2016 CLINICAL DATA:  80 year old male with right flank pain beginning approximately 3 days ago EXAM: CT ABDOMEN AND PELVIS WITHOUT CONTRAST TECHNIQUE: Multidetector CT imaging of the abdomen and pelvis was performed following the standard protocol without IV contrast. COMPARISON:  Prior CT abdomen/pelvis 03/06/2015 and 06/12/2011 FINDINGS: Lower chest: Centrilobular emphysema. No focal airspace consolidation or suspicious nodule or mass. Visualized cardiac structures are within normal limits for size. Unremarkable visualized distal thoracic esophagus. No pericardial effusion. Hepatobiliary: No focal liver abnormality is seen. Status post  cholecystectomy. No biliary dilatation. Pancreas: Unremarkable. No pancreatic ductal dilatation or surrounding inflammatory changes. Spleen: Normal in size without focal abnormality. Adrenals/Urinary Tract: Normal adrenal glands. Punctate nonobstructing stone in the lower pole collecting system of the left kidney. Second punctate stone identified in the upper pole collecting system of the left kidney. No evidence of hydronephrosis. Stable low-attenuation lesion exophytic from the interpolar right kidney most consistent with a simple cyst. Stomach/Bowel: Colonic diverticular disease without CT evidence of active inflammation. No focal bowel wall thickening or evidence of obstruction. Nonspecific 1.4 cm intermediate attenuation nodule in the gastrohepatic ligament remains essentially unchanged dating back to September of 2012 and likely represents a benign lymph node. The appendix is surgically absent. Vascular/Lymphatic: Atherosclerotic vascular calcifications scattered throughout the abdominal aorta. No evidence of aneurysm. Incompletely evaluated in the absence of intravenous contrast. No suspicious lymphadenopathy. Reproductive: Prostate is unremarkable. Other: No abdominal wall hernia or abnormality. No abdominopelvic ascites. Musculoskeletal: No acute or significant osseous findings. Multilevel degenerative disc disease of a mild to moderate severity. Smooth margined erosion in the S3 vertebral body with a narrow zone of transition and thin sclerotic margin. Centrally, there is a water attenuation cystic structure. Overall, this structure has slowly enlarged compared September of 2012 and likely represents a Tarlov cyst. IMPRESSION: 1. No acute abnormality in the abdomen or pelvis to explain the patient's clinical symptoms. Specifically, no evidence of right-sided hydronephrosis or nephroureterolithiasis. 2. There are 2 punctate nonobstructing stones in the left kidney. 3. Colonic diverticular disease without CT  evidence of active inflammation. 4. Centrilobular pulmonary emphysema. 5. Surgical changes of prior cholecystectomy and appendectomy. 6. Slowly enlarging S3 Tarlov cyst. 7.  Aortic Atherosclerosis (ICD10-170.0) 8. Multilevel degenerative disc disease. Electronically Signed   By: Jacqulynn Cadet M.D.   On: 08/15/2016 14:27    Procedures Procedures (including critical care time)  Medications Ordered in ED Medications  HYDROcodone-acetaminophen (NORCO/VICODIN) 5-325 MG per tablet 1 tablet (1 tablet Oral Given 08/15/16 1326)     Initial Impression / Assessment and Plan / ED Course  I have reviewed the triage vital signs and the nursing notes.  Pertinent labs & imaging results that were available during my care of the patient were reviewed by me and considered in my medical decision making (see chart for details).  Clinical Course     patient with flak pain  Urine reassuing. CT scan done and also reassuring.  Dos not appear to need blood work at this tie. Will treat symptomatically. Will follow up as needed. Early zoster considered  Final Clinical Impressions(s) / ED Diagnoses   Final diagnoses:  Flank pain    New Prescriptions Discharge Medication List as of 08/15/2016  2:50 PM    START taking these medications   Details  HYDROcodone-acetaminophen (NORCO/VICODIN) 5-325 MG tablet Take 2 tablets by mouth every 4 (four) hours as needed., Starting Sat 08/15/2016, Print    methocarbamol (ROBAXIN) 500 MG tablet Take 1 tablet (500 mg total) by mouth every 8 (eight) hours as needed for muscle spasms., Starting Sat 08/15/2016, Print         Davonna Belling, MD 08/15/16 [phone]

## 2016-08-15 NOTE — ED Notes (Signed)
Patient transported to X-ray 

## 2016-09-10 ENCOUNTER — Ambulatory Visit: Payer: Medicare Other

## 2016-09-10 DIAGNOSIS — Z136 Encounter for screening for cardiovascular disorders: Secondary | ICD-10-CM | POA: Diagnosis not present

## 2016-09-17 DIAGNOSIS — Z85828 Personal history of other malignant neoplasm of skin: Secondary | ICD-10-CM | POA: Diagnosis not present

## 2016-09-17 DIAGNOSIS — Z08 Encounter for follow-up examination after completed treatment for malignant neoplasm: Secondary | ICD-10-CM | POA: Diagnosis not present

## 2016-09-17 DIAGNOSIS — X32XXXD Exposure to sunlight, subsequent encounter: Secondary | ICD-10-CM | POA: Diagnosis not present

## 2016-09-17 DIAGNOSIS — L570 Actinic keratosis: Secondary | ICD-10-CM | POA: Diagnosis not present

## 2016-09-17 DIAGNOSIS — D225 Melanocytic nevi of trunk: Secondary | ICD-10-CM | POA: Diagnosis not present

## 2016-09-30 ENCOUNTER — Telehealth: Payer: Self-pay | Admitting: *Deleted

## 2016-09-30 NOTE — Telephone Encounter (Signed)
Pt aware - routed to pcp  

## 2016-09-30 NOTE — Telephone Encounter (Signed)
-----   Message from Arnoldo Lenis, MD sent at 09/28/2016  1:21 PM EST ----- Normal abdominal US, no aortic aneurysm  Zandra Abts MD

## 2017-01-11 ENCOUNTER — Other Ambulatory Visit: Payer: Self-pay | Admitting: Cardiology

## 2017-01-31 IMAGING — CT CT RENAL STONE PROTOCOL
2 of 4 series · 16 of 46 positions shown, 18 images · non-contrast
Comparison: 10/22/2013

CLINICAL DATA: RIGHT flank pain for 3 weeks, history kidney stones,
hyperlipidemia, coronary artery disease, COPD, former smoker, prior
cholecystectomy and appendectomy

EXAM:
CT ABDOMEN AND PELVIS WITHOUT CONTRAST
TECHNIQUE: Multidetector CT imaging of the abdomen and pelvis was performed
following the standard protocol without IV contrast. Sagittal and
coronal MPR images reconstructed from axial data set.

[Series 2: standard/full over (age)lbs 5.0 · axial · 0.75mm/px · z∈[-468,-43]mm · 13 of 93 slices shown, 15 images]
[im 4/93  soft-tissue]
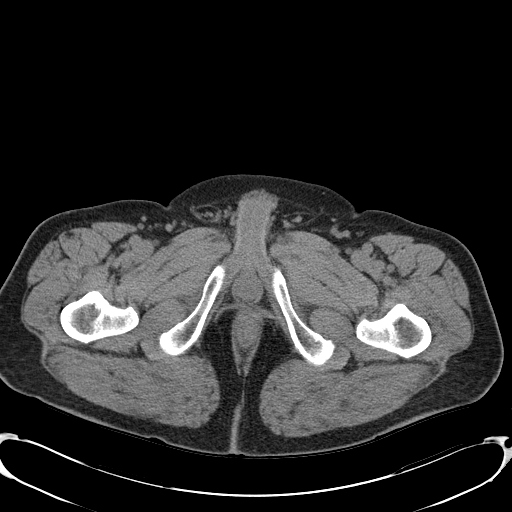
[im 4/93  bone]
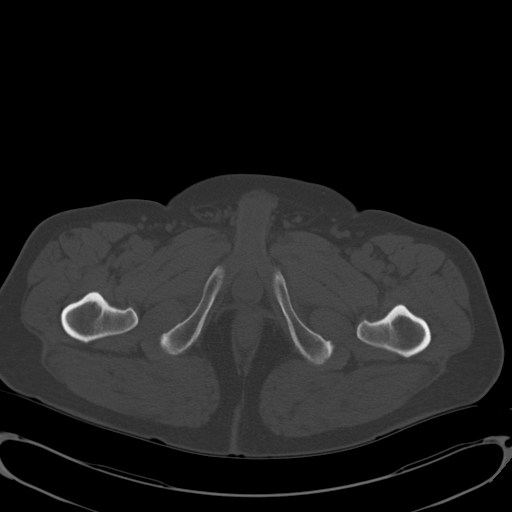
[im 11/93  soft-tissue]
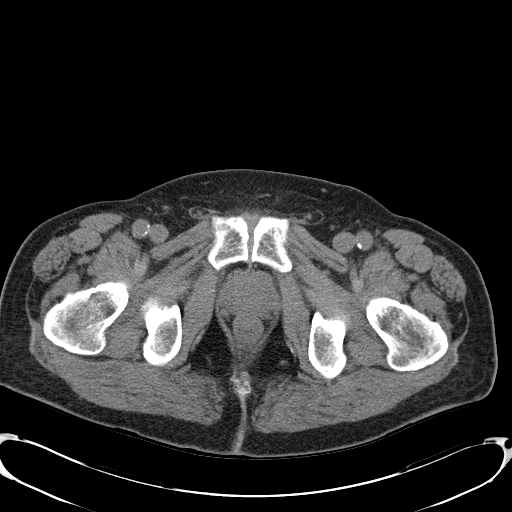
[im 18/93  soft-tissue]
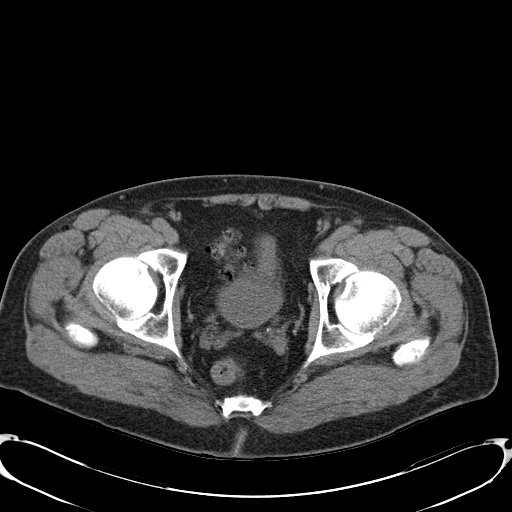
[im 25/93  soft-tissue]
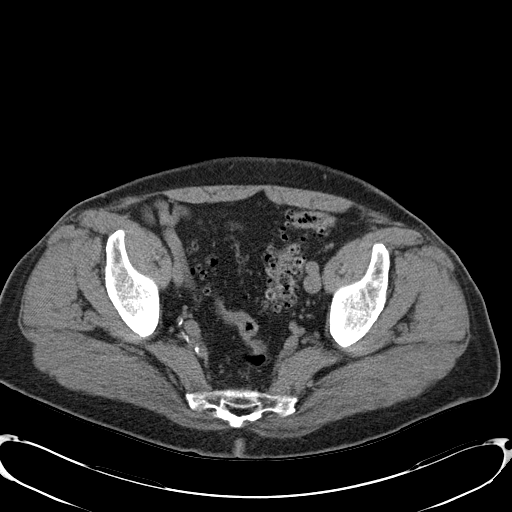
[im 32/93  soft-tissue]
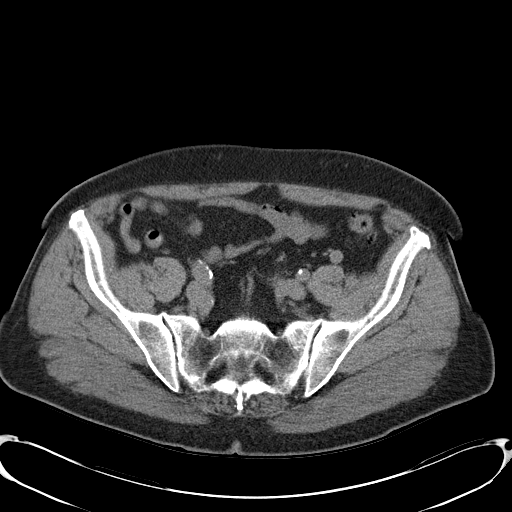
[im 39/93  soft-tissue]
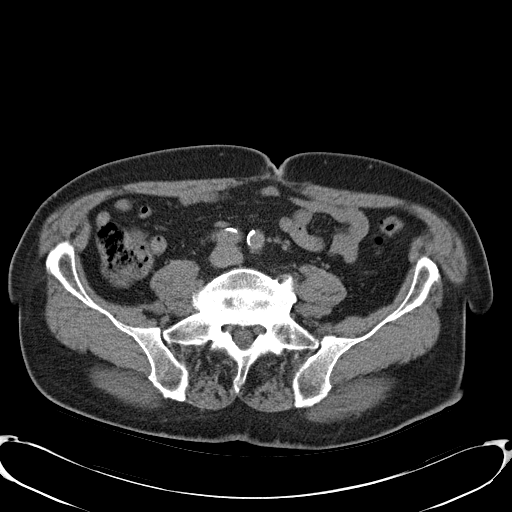
[im 47/93  soft-tissue]
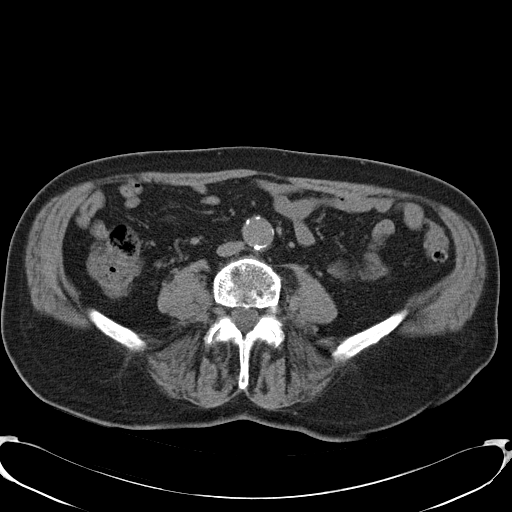
[im 54/93  soft-tissue]
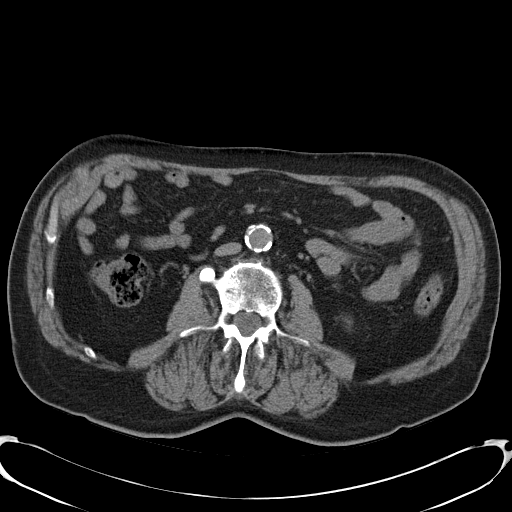
[im 61/93  soft-tissue]
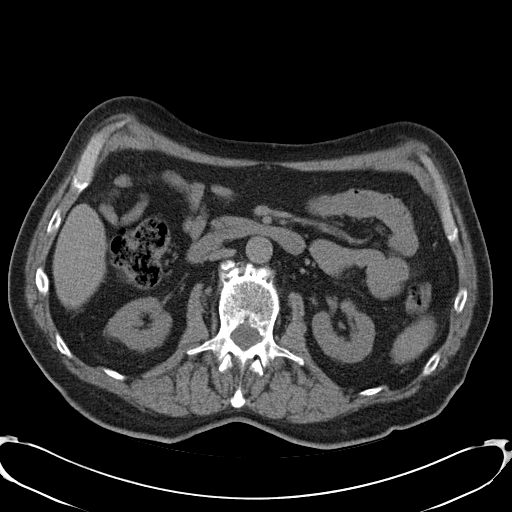
[im 61/93  bone]
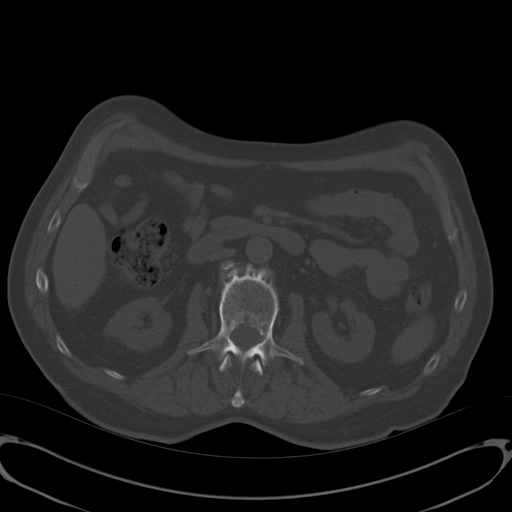
[im 68/93  soft-tissue]
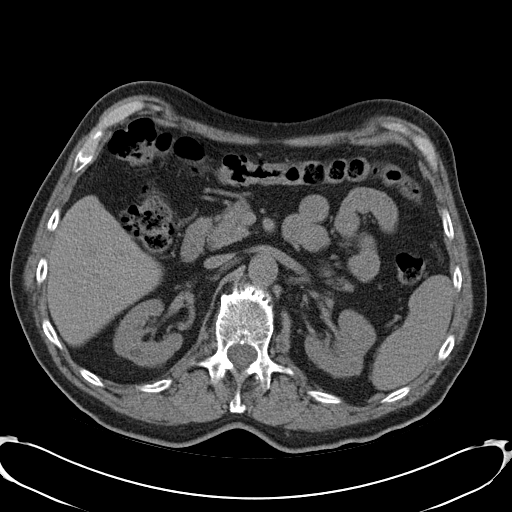
[im 75/93  soft-tissue]
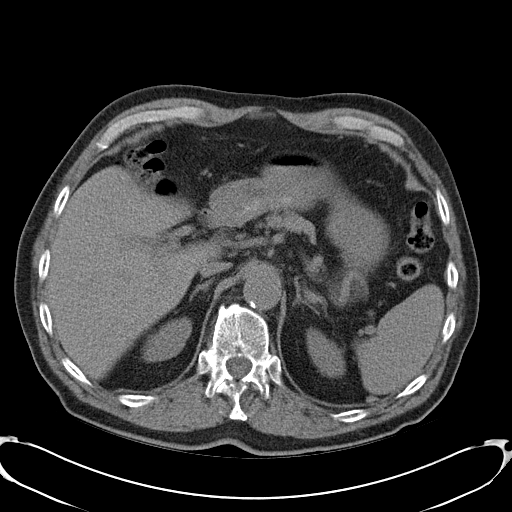
[im 82/93  soft-tissue]
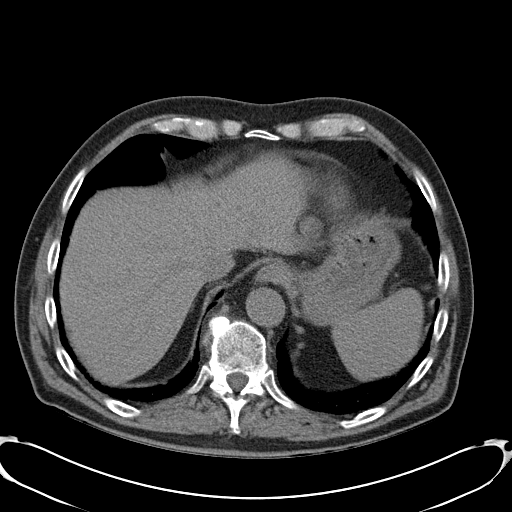
[im 89/93  soft-tissue]
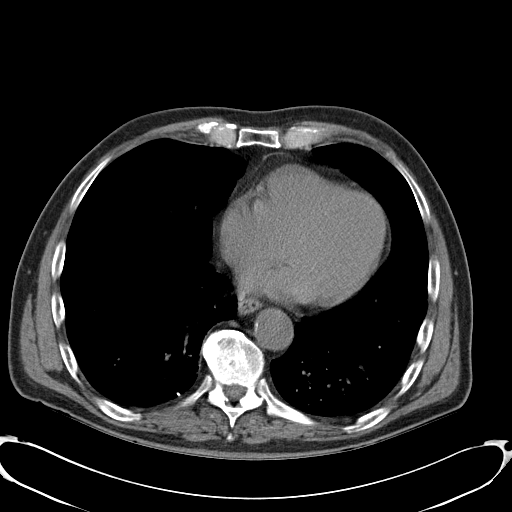

[Series 3: mpr coronal · coronal · 0.76mm/px · 3 of 101 slices shown]
[im 34/101  soft-tissue]
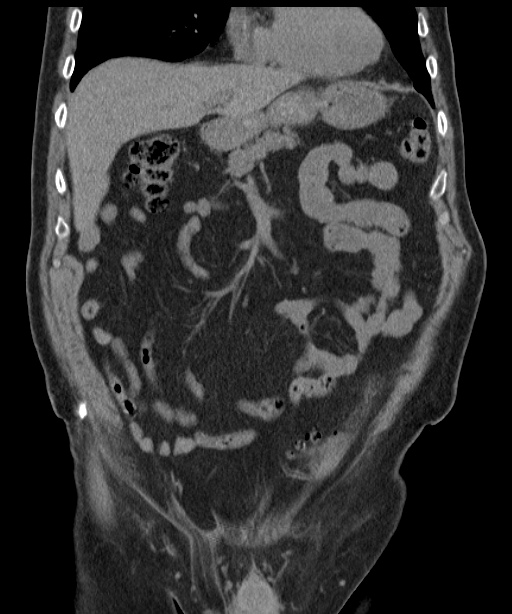
[im 45/101  soft-tissue]
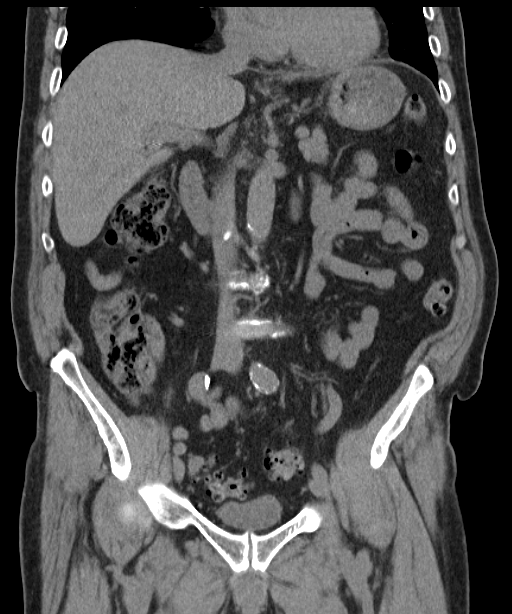
[im 56/101  soft-tissue]
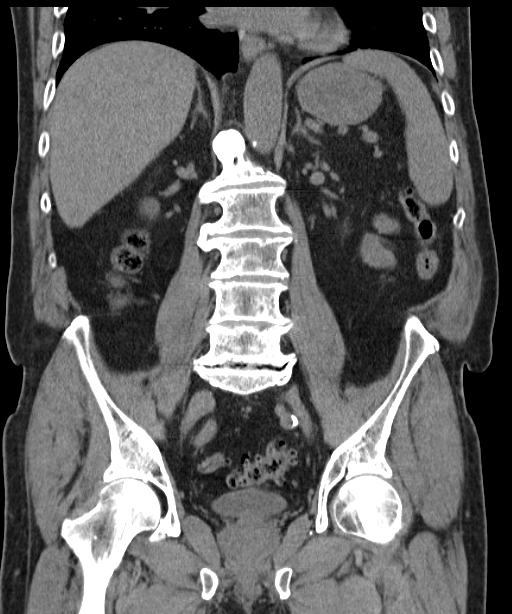

[16 of 46 positions shown; findings below may reference images not displayed]

FINDINGS: Minimal dependent bibasilar atelectasis.

Tiny nonobstructing calculus inferior pole LEFT kidney image 36.

No additional urinary tract calcification, hydronephrosis or
ureteral dilatation.

Gallbladder surgically absent.

Small exophytic RIGHT renal cyst 12 mm diameter image 25.

Question additional tiny mid RIGHT renal cyst 7 mm diameter image
26.

Within limits of a nonenhanced exam no additional focal
abnormalities of the liver, spleen, pancreas, kidneys, or adrenal
glands.

Unremarkable bladder and ureters.

Prostate gland stable, 4.3 x 3.2 cm image 82.

Extensive diverticulosis of sigmoid and descending colon without
evidence of diverticulitis.

Stomach and bowel loops otherwise unremarkable.

Scattered atherosclerotic calcifications without aortic aneurysm;
common iliac arteries are mildly dilated bilaterally up to 19 mm
LEFT and 14 mm RIGHT.

No mass, adenopathy, free air or free fluid.

Bones mildly demineralized.
IMPRESSION: Tiny nonobstructing LEFT renal calculus.

No additional urinary tract calcifications or dilatation.

Probable small RIGHT renal cysts.

Scattered atherosclerotic disease changes with aneurysmal dilatation
of the common iliac arteries.

Distal colonic diverticulosis without evidence of diverticulitis.

## 2017-02-01 ENCOUNTER — Emergency Department (HOSPITAL_COMMUNITY)
Admission: EM | Admit: 2017-02-01 | Discharge: 2017-02-01 | Disposition: A | Discharge status: Home / Self Care | Payer: Medicare Other | Attending: Emergency Medicine | Admitting: Emergency Medicine

## 2017-02-01 ENCOUNTER — Emergency Department (HOSPITAL_COMMUNITY): Payer: Medicare Other

## 2017-02-01 ENCOUNTER — Encounter (HOSPITAL_COMMUNITY): Payer: Self-pay | Admitting: Emergency Medicine

## 2017-02-01 DIAGNOSIS — I2510 Atherosclerotic heart disease of native coronary artery without angina pectoris: Secondary | ICD-10-CM | POA: Insufficient documentation

## 2017-02-01 DIAGNOSIS — J449 Chronic obstructive pulmonary disease, unspecified: Secondary | ICD-10-CM | POA: Insufficient documentation

## 2017-02-01 DIAGNOSIS — Z7982 Long term (current) use of aspirin: Secondary | ICD-10-CM | POA: Insufficient documentation

## 2017-02-01 DIAGNOSIS — Y999 Unspecified external cause status: Secondary | ICD-10-CM | POA: Insufficient documentation

## 2017-02-01 DIAGNOSIS — M25552 Pain in left hip: Secondary | ICD-10-CM | POA: Diagnosis not present

## 2017-02-01 DIAGNOSIS — S3992XA Unspecified injury of lower back, initial encounter: Secondary | ICD-10-CM | POA: Diagnosis present

## 2017-02-01 DIAGNOSIS — S79912A Unspecified injury of left hip, initial encounter: Secondary | ICD-10-CM | POA: Diagnosis not present

## 2017-02-01 DIAGNOSIS — W19XXXA Unspecified fall, initial encounter: Secondary | ICD-10-CM

## 2017-02-01 DIAGNOSIS — Y939 Activity, unspecified: Secondary | ICD-10-CM | POA: Diagnosis not present

## 2017-02-01 DIAGNOSIS — I10 Essential (primary) hypertension: Secondary | ICD-10-CM | POA: Diagnosis not present

## 2017-02-01 DIAGNOSIS — Z79899 Other long term (current) drug therapy: Secondary | ICD-10-CM | POA: Diagnosis not present

## 2017-02-01 DIAGNOSIS — Y9289 Other specified places as the place of occurrence of the external cause: Secondary | ICD-10-CM | POA: Insufficient documentation

## 2017-02-01 DIAGNOSIS — F17220 Nicotine dependence, chewing tobacco, uncomplicated: Secondary | ICD-10-CM | POA: Insufficient documentation

## 2017-02-01 DIAGNOSIS — S300XXA Contusion of lower back and pelvis, initial encounter: Secondary | ICD-10-CM | POA: Diagnosis not present
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
## 2017-02-01 NOTE — Discharge Instructions (Signed)
Use ice on the sore area 3 or 4 times a day for 2 days.  After that use heat.  Use Advil as needed for pain.

## 2017-02-22 ENCOUNTER — Other Ambulatory Visit: Payer: Self-pay | Admitting: *Deleted

## 2017-02-22 MED ORDER — SIMVASTATIN 40 MG PO TABS: 40.0000 mg | ORAL_TABLET | Freq: Every evening | ORAL | 0 refills | Status: DC

## 2017-02-23 ENCOUNTER — Ambulatory Visit (INDEPENDENT_AMBULATORY_CARE_PROVIDER_SITE_OTHER): Payer: Medicare Other | Admitting: Cardiology

## 2017-02-23 ENCOUNTER — Other Ambulatory Visit: Payer: Self-pay | Admitting: Cardiology

## 2017-02-23 ENCOUNTER — Encounter: Payer: Self-pay | Admitting: Cardiology

## 2017-02-23 VITALS — BP 136/78 | HR 65 | Ht 72.0 in | Wt 181.4 lb

## 2017-02-23 DIAGNOSIS — R7309 Other abnormal glucose: Secondary | ICD-10-CM | POA: Diagnosis not present

## 2017-02-23 DIAGNOSIS — I10 Essential (primary) hypertension: Secondary | ICD-10-CM | POA: Diagnosis not present

## 2017-02-23 DIAGNOSIS — E785 Hyperlipidemia, unspecified: Secondary | ICD-10-CM | POA: Diagnosis not present

## 2017-02-23 DIAGNOSIS — I2510 Atherosclerotic heart disease of native coronary artery without angina pectoris: Secondary | ICD-10-CM

## 2017-02-23 LAB — CBC
HCT: 45.3 % (ref 38.5–50.0)
HEMOGLOBIN: 15.1 g/dL (ref 13.2–17.1)
MCH: 29.4 pg (ref 27.0–33.0)
MCHC: 33.3 g/dL (ref 32.0–36.0)
MCV: 88.1 fL (ref 80.0–100.0)
MPV: 10.8 fL (ref 7.5–12.5)
Platelets: 180 10*3/uL (ref 140–400)
RBC: 5.14 MIL/uL (ref 4.20–5.80)
RDW: 14.2 % (ref 11.0–15.0)
WBC: 4.7 10*3/uL (ref 3.8–10.8)

## 2017-02-23 LAB — LIPID PANEL
CHOL/HDL RATIO: 3.4 ratio (ref <5.0)
CHOLESTEROL: 122 mg/dL (ref <200)
HDL: 36 mg/dL — ABNORMAL LOW (ref >40)
LDL Cholesterol: 69 mg/dL (ref <100)
Triglycerides: 84 mg/dL (ref <150)
VLDL: 17 mg/dL (ref <30)

## 2017-02-23 LAB — BASIC METABOLIC PANEL
BUN: 11 mg/dL (ref 7–25)
CO2: 30 mmol/L (ref 20–31)
Calcium: 8.7 mg/dL (ref 8.6–10.3)
Chloride: 107 mmol/L (ref 98–110)
Creat: 1.14 mg/dL (ref 0.70–1.18)
GLUCOSE: 88 mg/dL (ref 65–99)
Potassium: 4 mmol/L (ref 3.5–5.3)
SODIUM: 143 mmol/L (ref 135–146)

## 2017-02-23 LAB — TSH: TSH: 2.17 m[IU]/L (ref 0.40–4.50)

## 2017-02-23 LAB — MAGNESIUM: Magnesium: 2 mg/dL (ref 1.5–2.5)

## 2017-02-23 NOTE — Patient Instructions (Signed)

## 2017-02-23 NOTE — Progress Notes (Signed)
Clinical Summary Mr. Purohit is a 81 y.o.male seen for the following medical problems.   1. CAD - history of prior stenting to LAD - nuclear stress 03/2014 with no ischemia. LVEF 47%.  - admit 04/2016 with chest pain, negative EKG and enzymes. Symptoms better with belching, thought GI etiology. He had been off his protonix at the time. Symptoms improved after restarting.    - - denies any chest pain. No SOB or DOE - compliant with meds  2. COPD - PFTs 10/2015 with moderate COPD - no recent symptoms   3. 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   Past Medical History:  Diagnosis Date  . Abnormal CXR 4/20011   Chest  CT mode/serere COPD ( no mediastinal abnormality)  . CAD (coronary artery disease) 2005   anterior MI with stent to the LAD in 1991 / stent 1996 / nuclear 2005, no ischemia  . Carotid bruit    Doppler, June, 2013, 0-39% bilateral  . COPD (chronic obstructive pulmonary disease) ([place])   . Ejection fraction    55-60%, echo, April, 2011, could not estimate RV pressure  . HOH (hard of hearing)   . Hyperlipidemia   . Hypertension   . Kidney stone   . Myocardial infarction ([place])   . Shortness of breath    with exertion  . Sleep apnea    Stop Bang score of 5  . Tobacco abuse      No Known Allergies   Current Outpatient Prescriptions  Medication Sig Dispense Refill  . albuterol (PROVENTIL HFA;VENTOLIN HFA) 108 (90 Base) MCG/ACT inhaler Inhale 1-2 puffs into the lungs every 6 (six) hours as needed for wheezing or shortness of breath.    Marland Kitchen aspirin 81 MG tablet Take 81 mg by mouth every evening.     Marland Kitchen aspirin-acetaminophen-caffeine (EXCEDRIN MIGRAINE) 250-250-65 MG tablet Take 1 tablet by mouth every 6 (six) hours as needed (pain).    . Aspirin-Salicylamide-Caffeine (ARTHRITIS STRENGTH BC POWDER PO) Take 1 Package by  mouth daily as needed (pain).    Marland Kitchen ibuprofen (ADVIL,MOTRIN) 200 MG tablet Take 400 mg by mouth every 6 (six) hours as needed for moderate pain.    . nitroGLYCERIN (NITROSTAT) 0.4 MG SL tablet PLACE 1 TABLET UNDER THE TONGUE EVERY 5 MINUTES FOR 3 DOSES AS NEEDED FOR CHEST PAIN 75 tablet 3  . pantoprazole (PROTONIX) 40 MG tablet TAKE 1 TABLET BY MOUTH DAILY 30 MINUTES BEFORE BREAKFAST (Patient not taking: Reported on 02/01/2017) 90 tablet 3  . simvastatin (ZOCOR) 40 MG tablet Take 1 tablet (40 mg total) by mouth every evening. 30 tablet 0  . tiotropium (SPIRIVA HANDIHALER) 18 MCG inhalation capsule Place 1 capsule (18 mcg total) into inhaler and inhale daily. (Patient taking differently: Place 18 mcg into inhaler and inhale daily as needed (for shortness of breath). ) 30 capsule 12   No current facility-administered medications for this visit.      Past Surgical History:  Procedure Laterality Date  . APPENDECTOMY    . CARDIAC CATHETERIZATION    . CATARACT EXTRACTION Left   . CATARACT EXTRACTION W/PHACO Right 01/22/2014   Procedure: CATARACT EXTRACTION PHACO AND INTRAOCULAR LENS PLACEMENT (IOC);  Surgeon: Tonny Lovis More, MD;  Location: [facility];  Service: Ophthalmology;  Laterality: Right;  CDE 7.46  . CHOLECYSTECTOMY  8 years ago [facility]  . COLONOSCOPY  10/05/2012   Dr. Laural Golden: multiple diverticula at sigmoid colon, multiple polyps (tubular adenomas), small external hemorrhoids  . CYST REMOVAL HAND     right wrist  . ESOPHAGEAL DILATION N/A 05/23/2015   Procedure: ESOPHAGEAL DILATION;  Surgeon: Daneil Dolin, MD;  Location: [facility];  Service: Endoscopy;  Laterality: N/A;  . ESOPHAGOGASTRODUODENOSCOPY N/A 05/23/2015   Dr. Gala Romney: Ulcerative/ erosive reflux esophagitis with peptic stricture formation. Status post dilation as described. Hiatal hernia. Abnormal gastric mucosa of uncertain significance status post gastric biospy. +H.pylori  . PERCUTANEOUS STENT INTERVENTION    . SKIN CANCER EXCISION   2015   ear & neck  . TONSILLECTOMY       No Known Allergies    Family History  Problem Relation Age of Onset  . Heart disease Mother   . Heart disease Father   . Heart disease Brother   . Stomach cancer Brother   . Colon cancer Neg Hx      Social History Mr. Mctigue reports that he quit smoking about 22 years ago. His smoking use included Cigarettes. He started smoking about 33 years ago. He has a 36.00 pack-year smoking history. His smokeless tobacco use includes Chew. Mr. Vandam reports that he does not drink alcohol.   Review of Systems CONSTITUTIONAL: No weight loss, fever, chills, weakness or fatigue.  HEENT: Eyes: No visual loss, blurred vision, double vision or yellow sclerae.No hearing loss, sneezing, congestion, runny nose or sore throat.  SKIN: No rash or itching.  CARDIOVASCULAR: per hpi RESPIRATORY: No shortness of breath, cough or sputum.  GASTROINTESTINAL: No anorexia, nausea, vomiting or diarrhea. No abdominal pain or blood.  GENITOURINARY: No burning on urination, no polyuria NEUROLOGICAL: No headache, dizziness, syncope, paralysis, ataxia, numbness or tingling in the extremities. No change in bowel or bladder control.  MUSCULOSKELETAL: No muscle, back pain, joint pain or stiffness.  LYMPHATICS: No enlarged nodes. No history of splenectomy.  PSYCHIATRIC: No history of depression or anxiety.  ENDOCRINOLOGIC: No reports of sweating, cold or heat intolerance. No polyuria or polydipsia.  Marland Kitchen   Physical Examination Vitals:   02/23/17 1317  BP: 136/78  Pulse: 65   Vitals:   02/23/17 1317  Weight: 181 lb 6.4 oz (82.3 kg)  Height: 6' (1.829 m)    Gen: resting comfortably, no acute distress HEENT: no scleral icterus, pupils equal round and reactive, no palptable cervical adenopathy,  CV: RRR, no m/r/g, no jvd Resp: Clear to auscultation bilaterally GI: abdomen is soft, non-tender, non-distended, normal bowel sounds, no hepatosplenomegaly MSK: extremities  are warm, no edema.  Skin: warm, no rash Neuro:  no focal deficits Psych: appropriate affect   Diagnostic Studies  08/2016 AAA [place] No aneurysm   Assessment and Plan  1. CAD -denies any symptoms. EKG today shows SR, no ischemic changes - continue current meds  2. Hyperlipidemia - repeat lipid panel pending.  - he will continue current statin.         Arnoldo Lenis, M.D.

## 2017-02-24 LAB — HEMOGLOBIN A1C
HEMOGLOBIN A1C: 5 % (ref <5.7)
Mean Plasma Glucose: 97 mg/dL

## 2017-03-22 DIAGNOSIS — X32XXXD Exposure to sunlight, subsequent encounter: Secondary | ICD-10-CM | POA: Diagnosis not present

## 2017-03-22 DIAGNOSIS — L570 Actinic keratosis: Secondary | ICD-10-CM | POA: Diagnosis not present

## 2017-03-24 ENCOUNTER — Other Ambulatory Visit: Payer: Self-pay | Admitting: Cardiology

## 2017-04-22 ENCOUNTER — Other Ambulatory Visit: Payer: Self-pay | Admitting: Cardiology

## 2017-07-28 DIAGNOSIS — Z23 Encounter for immunization: Secondary | ICD-10-CM | POA: Diagnosis not present

## 2017-08-25 ENCOUNTER — Encounter: Payer: Self-pay | Admitting: Internal Medicine

## 2017-11-16 ENCOUNTER — Inpatient Hospital Stay (HOSPITAL_COMMUNITY)
Admission: EM | Admit: 2017-11-16 | Discharge: 2017-11-18 | DRG: 379 | Disposition: A | Discharge status: Home / Self Care | Payer: Medicare Other | Attending: Internal Medicine | Admitting: Internal Medicine

## 2017-11-16 ENCOUNTER — Emergency Department (HOSPITAL_COMMUNITY): Payer: Medicare Other

## 2017-11-16 ENCOUNTER — Other Ambulatory Visit: Payer: Self-pay

## 2017-11-16 ENCOUNTER — Encounter (HOSPITAL_COMMUNITY): Payer: Self-pay | Admitting: Emergency Medicine

## 2017-11-16 DIAGNOSIS — E876 Hypokalemia: Secondary | ICD-10-CM | POA: Diagnosis not present

## 2017-11-16 DIAGNOSIS — I2510 Atherosclerotic heart disease of native coronary artery without angina pectoris: Secondary | ICD-10-CM | POA: Diagnosis present

## 2017-11-16 DIAGNOSIS — K222 Esophageal obstruction: Secondary | ICD-10-CM

## 2017-11-16 DIAGNOSIS — J449 Chronic obstructive pulmonary disease, unspecified: Secondary | ICD-10-CM | POA: Diagnosis present

## 2017-11-16 DIAGNOSIS — K921 Melena: Secondary | ICD-10-CM | POA: Diagnosis not present

## 2017-11-16 DIAGNOSIS — Z79899 Other long term (current) drug therapy: Secondary | ICD-10-CM

## 2017-11-16 DIAGNOSIS — F17220 Nicotine dependence, chewing tobacco, uncomplicated: Secondary | ICD-10-CM | POA: Diagnosis present

## 2017-11-16 DIAGNOSIS — K5731 Diverticulosis of large intestine without perforation or abscess with bleeding: Secondary | ICD-10-CM | POA: Diagnosis not present

## 2017-11-16 DIAGNOSIS — K2100 Gastro-esophageal reflux disease with esophagitis, without bleeding: Secondary | ICD-10-CM | POA: Diagnosis present

## 2017-11-16 DIAGNOSIS — R1314 Dysphagia, pharyngoesophageal phase: Secondary | ICD-10-CM | POA: Diagnosis present

## 2017-11-16 DIAGNOSIS — G4730 Sleep apnea, unspecified: Secondary | ICD-10-CM | POA: Diagnosis present

## 2017-11-16 DIAGNOSIS — K449 Diaphragmatic hernia without obstruction or gangrene: Secondary | ICD-10-CM | POA: Diagnosis present

## 2017-11-16 DIAGNOSIS — Z955 Presence of coronary angioplasty implant and graft: Secondary | ICD-10-CM

## 2017-11-16 DIAGNOSIS — J310 Chronic rhinitis: Secondary | ICD-10-CM | POA: Diagnosis present

## 2017-11-16 DIAGNOSIS — H9190 Unspecified hearing loss, unspecified ear: Secondary | ICD-10-CM | POA: Diagnosis present

## 2017-11-16 DIAGNOSIS — E785 Hyperlipidemia, unspecified: Secondary | ICD-10-CM | POA: Diagnosis present

## 2017-11-16 DIAGNOSIS — K2950 Unspecified chronic gastritis without bleeding: Secondary | ICD-10-CM | POA: Diagnosis present

## 2017-11-16 DIAGNOSIS — K922 Gastrointestinal hemorrhage, unspecified: Secondary | ICD-10-CM | POA: Diagnosis present

## 2017-11-16 DIAGNOSIS — Z86010 Personal history of colonic polyps: Secondary | ICD-10-CM

## 2017-11-16 DIAGNOSIS — R634 Abnormal weight loss: Secondary | ICD-10-CM | POA: Diagnosis present

## 2017-11-16 DIAGNOSIS — K648 Other hemorrhoids: Secondary | ICD-10-CM | POA: Diagnosis present

## 2017-11-16 DIAGNOSIS — I10 Essential (primary) hypertension: Secondary | ICD-10-CM | POA: Diagnosis present

## 2017-11-16 DIAGNOSIS — B9681 Helicobacter pylori [H. pylori] as the cause of diseases classified elsewhere: Secondary | ICD-10-CM | POA: Diagnosis not present

## 2017-11-16 DIAGNOSIS — Z7982 Long term (current) use of aspirin: Secondary | ICD-10-CM

## 2017-11-16 DIAGNOSIS — T39395A Adverse effect of other nonsteroidal anti-inflammatory drugs [NSAID], initial encounter: Secondary | ICD-10-CM | POA: Diagnosis present

## 2017-11-16 DIAGNOSIS — I252 Old myocardial infarction: Secondary | ICD-10-CM

## 2017-11-16 DIAGNOSIS — Z720 Tobacco use: Secondary | ICD-10-CM | POA: Diagnosis present

## 2017-11-16 DIAGNOSIS — Z85828 Personal history of other malignant neoplasm of skin: Secondary | ICD-10-CM

## 2017-11-16 DIAGNOSIS — Z8249 Family history of ischemic heart disease and other diseases of the circulatory system: Secondary | ICD-10-CM

## 2017-11-16 DIAGNOSIS — K625 Hemorrhage of anus and rectum: Secondary | ICD-10-CM | POA: Diagnosis present

## 2017-11-16 HISTORY — DX: Esophagitis, unspecified: K20.9

## 2017-11-16 HISTORY — DX: Diverticulosis of large intestine without perforation or abscess without bleeding: K57.30

## 2017-11-16 HISTORY — DX: Esophagitis, unspecified without bleeding: K20.90

## 2017-11-16 LAB — COMPREHENSIVE METABOLIC PANEL
ALK PHOS: 86 U/L (ref 38–126)
ALT: 13 U/L — ABNORMAL LOW (ref 17–63)
ANION GAP: 11 (ref 5–15)
AST: 20 U/L (ref 15–41)
Albumin: 3.8 g/dL (ref 3.5–5.0)
BILIRUBIN TOTAL: 0.6 mg/dL (ref 0.3–1.2)
BUN: 10 mg/dL (ref 6–20)
CALCIUM: 8.8 mg/dL — AB (ref 8.9–10.3)
CO2: 25 mmol/L (ref 22–32)
Chloride: 104 mmol/L (ref 101–111)
Creatinine, Ser: 1.02 mg/dL (ref 0.61–1.24)
GFR calc Af Amer: >60 mL/min (ref >60)
Glucose, Bld: 121 mg/dL — ABNORMAL HIGH (ref 65–99)
POTASSIUM: 3.1 mmol/L — AB (ref 3.5–5.1)
Sodium: 140 mmol/L (ref 135–145)
TOTAL PROTEIN: 6.6 g/dL (ref 6.5–8.1)

## 2017-11-16 LAB — POC OCCULT BLOOD, ED: FECAL OCCULT BLD: POSITIVE — AB

## 2017-11-16 LAB — TYPE AND SCREEN
ABO/RH(D): O NEG
Antibody Screen: NEGATIVE

## 2017-11-16 LAB — HEMOGLOBIN AND HEMATOCRIT, BLOOD
HCT: 43.8 % (ref 39.0–52.0)
HCT: 43.8 % (ref 39.0–52.0)
Hemoglobin: 14.4 g/dL (ref 13.0–17.0)
Hemoglobin: 14.6 g/dL (ref 13.0–17.0)

## 2017-11-16 LAB — CBC WITH DIFFERENTIAL/PLATELET
BASOS ABS: 0 10*3/uL (ref 0.0–0.1)
Basophils Relative: 0 %
EOS PCT: 4 %
Eosinophils Absolute: 0.2 10*3/uL (ref 0.0–0.7)
HEMATOCRIT: 45.2 % (ref 39.0–52.0)
Hemoglobin: 15 g/dL (ref 13.0–17.0)
LYMPHS PCT: 19 %
Lymphs Abs: 0.9 10*3/uL (ref 0.7–4.0)
MCH: 29.5 pg (ref 26.0–34.0)
MCHC: 33.2 g/dL (ref 30.0–36.0)
MCV: 89 fL (ref 78.0–100.0)
MONO ABS: 0.3 10*3/uL (ref 0.1–1.0)
MONOS PCT: 6 %
Neutro Abs: 3.4 10*3/uL (ref 1.7–7.7)
Neutrophils Relative %: 71 %
PLATELETS: 169 10*3/uL (ref 150–400)
RBC: 5.08 MIL/uL (ref 4.22–5.81)
RDW: 13.2 % (ref 11.5–15.5)
WBC: 4.7 10*3/uL (ref 4.0–10.5)

## 2017-11-16 LAB — PROTIME-INR
INR: 0.99
Prothrombin Time: 13 seconds (ref 11.4–15.2)

## 2017-11-16 LAB — TROPONIN I

## 2017-11-16 MED ORDER — POTASSIUM CHLORIDE CRYS ER 20 MEQ PO TBCR
40.0000 meq | EXTENDED_RELEASE_TABLET | Freq: Once | ORAL | Status: AC
  Administered 2017-11-16: 40 meq via ORAL
  Filled 2017-11-16: qty 2

## 2017-11-16 MED ORDER — ACETAMINOPHEN 650 MG RE SUPP: 650.0000 mg | Freq: Four times a day (QID) | RECTAL | Status: DC | PRN

## 2017-11-16 MED ORDER — PANTOPRAZOLE SODIUM 40 MG IV SOLR
40.0000 mg | Freq: Once | INTRAVENOUS | Status: AC
  Administered 2017-11-16: 40 mg via INTRAVENOUS
  Filled 2017-11-16: qty 40

## 2017-11-16 MED ORDER — SIMVASTATIN 20 MG PO TABS
40.0000 mg | ORAL_TABLET | Freq: Every evening | ORAL | Status: DC
  Administered 2017-11-16 – 2017-11-17 (×2): 40 mg via ORAL
  Filled 2017-11-16 (×2): qty 2

## 2017-11-16 MED ORDER — ONDANSETRON HCL 4 MG PO TABS: 4.0000 mg | ORAL_TABLET | Freq: Four times a day (QID) | ORAL | Status: DC | PRN

## 2017-11-16 MED ORDER — ALBUTEROL SULFATE (2.5 MG/3ML) 0.083% IN NEBU: 3.0000 mL | INHALATION_SOLUTION | Freq: Four times a day (QID) | RESPIRATORY_TRACT | Status: DC | PRN

## 2017-11-16 MED ORDER — NITROGLYCERIN 0.4 MG SL SUBL: 0.4000 mg | SUBLINGUAL_TABLET | SUBLINGUAL | Status: DC | PRN

## 2017-11-16 MED ORDER — ONDANSETRON HCL 4 MG/2ML IJ SOLN: 4.0000 mg | Freq: Four times a day (QID) | INTRAMUSCULAR | Status: DC | PRN

## 2017-11-16 MED ORDER — PANTOPRAZOLE SODIUM 40 MG IV SOLR
40.0000 mg | Freq: Two times a day (BID) | INTRAVENOUS | Status: DC
  Administered 2017-11-16 – 2017-11-18 (×4): 40 mg via INTRAVENOUS
  Filled 2017-11-16 (×5): qty 40

## 2017-11-16 MED ORDER — ACETAMINOPHEN 325 MG PO TABS
650.0000 mg | ORAL_TABLET | Freq: Four times a day (QID) | ORAL | Status: DC | PRN
  Administered 2017-11-16: 650 mg via ORAL
  Filled 2017-11-16: qty 2

## 2017-11-16 MED ORDER — ONDANSETRON HCL 4 MG/2ML IJ SOLN
4.0000 mg | INTRAMUSCULAR | Status: DC | PRN
  Administered 2017-11-16: 4 mg via INTRAVENOUS
  Filled 2017-11-16: qty 2

## 2017-11-16 MED ORDER — TIOTROPIUM BROMIDE MONOHYDRATE 18 MCG IN CAPS
18.0000 ug | ORAL_CAPSULE | Freq: Every day | RESPIRATORY_TRACT | Status: DC
Start: 2017-11-16 — End: 2017-11-18
  Administered 2017-11-17 – 2017-11-18 (×2): 18 ug via RESPIRATORY_TRACT
  Filled 2017-11-16: qty 5

## 2017-11-16 MED ORDER — SODIUM CHLORIDE 0.9% FLUSH
3.0000 mL | Freq: Two times a day (BID) | INTRAVENOUS | Status: DC
  Administered 2017-11-16 – 2017-11-18 (×4): 3 mL via INTRAVENOUS

## 2017-11-16 NOTE — ED Triage Notes (Signed)
Pt c/o of rectal bleeding starting at 5 am this morning. Bleeding was bright red blood with "clots" per pt.    Wife states that pt has been having headache for a month and has been taking BC powder's every day.

## 2017-11-16 NOTE — ED Notes (Signed)
Patient transported to X-ray 

## 2017-11-16 NOTE — ED Provider Notes (Signed)
[facility] EMERGENCY DEPARTMENT Provider Note   CSN: [id number] Arrival date & time: 11/16/17  5643     History   Chief Complaint Chief Complaint  Patient presents with  . Rectal Bleeding    HPI Jeremy [name] is a 82 y.o. male.  HPI  Pt was seen at [place]. Per pt, c/o sudden onset and persistence of constant "rectal bleeding" starting at 0500 today PTA. Pt states he was already awake, felt he needed to use the bathroom, and passed "black and bloody stool with clots." Has been associated with nausea.  Pt's wife states pt has had "a headache and a sinus infection" for the past 1 month, and has been taking BC powders daily to treat his pain. Denies CP/SOB, no N/V/D, no back pain, no abd pain.   Past Medical History:  Diagnosis Date  . Abnormal CXR 4/20011   Chest  CT mode/serere COPD ( no mediastinal abnormality)  . CAD (coronary artery disease) 2005   anterior MI with stent to the LAD in 1991 / stent 1996 / nuclear 2005, no ischemia  . Carotid bruit    Doppler, June, 2013, 0-39% bilateral  . COPD (chronic obstructive pulmonary disease) ([place])   . Diverticula of colon   . Ejection fraction    55-60%, echo, April, 2011, could not estimate RV pressure  . Esophagitis   . HOH (hard of hearing)   . Hyperlipidemia   . Hypertension   . Myocardial infarction ([place])   . Shortness of breath    with exertion  . Sleep apnea    Stop Bang score of 5  . Tobacco abuse     Patient Active Problem List   Diagnosis Date Noted  . Chest pain 05/02/2016  . H. pylori infection 08/15/2015  . Mucosal abnormality of stomach   . Reflux esophagitis   . Peptic stricture of esophagus   . Rectal bleeding 05/21/2015  . Dyspepsia 05/21/2015  . Dysphagia, pharyngoesophageal phase 05/21/2015  . Sinus bradycardia 03/19/2014  . Carotid artery disease ([place]) 03/15/2014  . Hypertension   . Hyperlipidemia   . CAD (coronary artery disease)   . Abnormal CXR   . Ejection fraction   . Tobacco abuse   .  COPD (chronic obstructive pulmonary disease) ([place])     Past Surgical History:  Procedure Laterality Date  . APPENDECTOMY    . CARDIAC CATHETERIZATION    . CATARACT EXTRACTION Left   . CATARACT EXTRACTION W/PHACO Right 01/22/2014   Procedure: CATARACT EXTRACTION PHACO AND INTRAOCULAR LENS PLACEMENT (IOC);  Surgeon: Tonny Branch, MD;  Location: [facility];  Service: Ophthalmology;  Laterality: Right;  CDE 7.46  . CHOLECYSTECTOMY     8 years ago [facility]  . COLONOSCOPY  10/05/2012   Dr. Laural Golden: multiple diverticula at sigmoid colon, multiple polyps (tubular adenomas), small external hemorrhoids  . CYST REMOVAL HAND     right wrist  . ESOPHAGEAL DILATION N/A 05/23/2015   Procedure: ESOPHAGEAL DILATION;  Surgeon: Daneil Dolin, MD;  Location: [facility];  Service: Endoscopy;  Laterality: N/A;  . ESOPHAGOGASTRODUODENOSCOPY N/A 05/23/2015   Dr. Gala Romney: Ulcerative/ erosive reflux esophagitis with peptic stricture formation. Status post dilation as described. Hiatal hernia. Abnormal gastric mucosa of uncertain significance status post gastric biospy. +H.pylori  . PERCUTANEOUS STENT INTERVENTION    . SKIN CANCER EXCISION  2015   ear & neck  . TONSILLECTOMY         Home Medications    Prior to Admission medications  Medication Sig Start Date End Date Taking? Authorizing Provider  albuterol (PROVENTIL HFA;VENTOLIN HFA) 108 (90 Base) MCG/ACT inhaler Inhale 1-2 puffs into the lungs every 6 (six) hours as needed for wheezing or shortness of breath.    [provider]  aspirin 81 MG tablet Take 81 mg by mouth every evening.     [provider]  aspirin-acetaminophen-caffeine (EXCEDRIN MIGRAINE) [phone] MG tablet Take 1 tablet by mouth every 6 (six) hours as needed (pain).    [provider]  Aspirin-Salicylamide-Caffeine (ARTHRITIS STRENGTH BC POWDER PO) Take 1 Package by mouth daily as needed (pain).    [provider]  ibuprofen (ADVIL,MOTRIN) 200 MG tablet Take 400  mg by mouth every 6 (six) hours as needed for moderate pain.    [provider]  nitroGLYCERIN (NITROSTAT) 0.4 MG SL tablet PLACE 1 TABLET UNDER THE TONGUE EVERY 5 MINUTES FOR 3 DOSES AS NEEDED FOR CHEST PAIN 08/11/16   Arnoldo Lenis, MD  simvastatin (ZOCOR) 40 MG tablet TAKE 1 TABLET BY MOUTH EVERY EVENING 04/22/17   Arnoldo Lenis, MD  tiotropium (SPIRIVA HANDIHALER) 18 MCG inhalation capsule Place 1 capsule (18 mcg total) into inhaler and inhale daily. Patient taking differently: Place 18 mcg into inhaler and inhale daily as needed (for shortness of breath).  11/11/15   Arnoldo Lenis, MD    Family History Family History  Problem Relation Age of Onset  . Heart disease Mother   . Heart disease Father   . Heart disease Brother   . Stomach cancer Brother   . Colon cancer Neg Hx     Social History Social History   Tobacco Use  . Smoking status: Former Smoker    Packs/day: 3.00    Years: 12.00    Pack years: 36.00    Types: Cigarettes    Start date: 09/15/1983    Last attempt to quit: 09/14/1994    Years since quitting: 23.1  . Smokeless tobacco: Current User    Types: Chew  . Tobacco comment: chews 80% of a pack tobacco per day -- 10/12 doesnt chew too much  Substance Use Topics  . Alcohol use: No    Alcohol/week: 0.0 oz  . Drug use: No     Allergies   Patient has no known allergies.   Review of Systems Review of Systems ROS: Statement: All systems negative except as marked or noted in the HPI; Constitutional: Negative for fever and chills. ; ; Eyes: Negative for eye pain, redness and discharge. ; ; ENMT: Negative for ear pain, hoarseness, nasal congestion, sinus pressure and sore throat. ; ; Cardiovascular: Negative for chest pain, palpitations, diaphoresis, dyspnea and peripheral edema. ; ; Respiratory: Negative for cough, wheezing and stridor. ; ; Gastrointestinal: Negative for vomiting, diarrhea, abdominal pain, hematemesis, jaundice and +nausea, rectal  bleeding. . ; ; Genitourinary: Negative for dysuria, flank pain and hematuria. ; ; Musculoskeletal: Negative for back pain and neck pain. Negative for swelling and trauma.; ; Skin: Negative for pruritus, rash, abrasions, blisters, bruising and skin lesion.; ; Neuro: Negative for lightheadedness and neck stiffness. Negative for weakness, altered level of consciousness, altered mental status, extremity weakness, paresthesias, involuntary movement, seizure and syncope.      Physical Exam Updated Vital Signs BP (!) 183/96 (BP Location: Right Arm)   Pulse 88   Temp 98 F (36.7 C) (Oral)   Resp 18   Ht 6' (1.829 m)   Wt 81.6 kg (180 lb)   SpO2 97%  BMI 24.41 kg/m     08:01 Orthostatic Vital Signs HC  Orthostatic Lying   BP- Lying: 176/84  Pulse- Lying: 80      Orthostatic Sitting  BP- Sitting: 165/86  Pulse- Sitting: 92      Orthostatic Standing at 0 minutes  BP- Standing at 0 minutes: 145/88  Pulse- Standing at 0 minutes: 94     Physical Exam 0750: Physical examination:  Nursing notes reviewed; Vital signs and O2 SAT reviewed;  Constitutional: Well developed, Well nourished, Well hydrated, In no acute distress; Head:  Normocephalic, atraumatic; Eyes: EOMI, PERRL, No scleral icterus; ENMT: Mouth and pharynx normal, Mucous membranes moist; Neck: Supple, Full range of motion, No lymphadenopathy; Cardiovascular: Regular rate and rhythm, No gallop; Respiratory: Breath sounds clear & equal bilaterally, No wheezes.  Speaking full sentences with ease, Normal respiratory effort/excursion; Chest: Nontender, Movement normal; Abdomen: Soft, Nontender, Nondistended, Normal bowel sounds. Rectal exam performed w/permission of pt and ED RN chaperone present.  Anal tone normal.  Non-tender, maroon stool in rectal vault, heme positive.  No fissures, no external hemorrhoids, no palp masses.;;; Genitourinary: No CVA tenderness; Extremities: Pulses normal, No tenderness, No edema, No calf edema or  asymmetry.; Neuro: AA&Ox3, Major CN grossly intact.  Speech clear. No gross focal motor or sensory deficits in extremities.; Skin: Color normal, Warm, Dry.   ED Treatments / Results  Labs (all labs ordered are listed, but only abnormal results are displayed)   EKG  EKG Interpretation  Date/Time:  Tuesday November 16 2017 08:06:43 EST Ventricular Rate:  79 PR Interval:    QRS Duration: 93 QT Interval:  392 QTC Calculation: 450 R Axis:   46 Text Interpretation:  Sinus rhythm Consider right atrial enlargement Baseline wander When compared with ECG of 05/02/2016 No significant change was found Confirmed by Francine Graven [phone]) on 11/16/2017 8:12:16 AM       Radiology   Procedures Procedures (including critical care time)  Medications Ordered in ED Medications  pantoprazole (PROTONIX) injection 40 mg (not administered)  ondansetron (ZOFRAN) injection 4 mg (not administered)     Initial Impression / Assessment and Plan / ED Course  I have reviewed the triage vital signs and the nursing notes.  Pertinent labs & imaging results that were available during my care of the patient were reviewed by me and considered in my medical decision making (see chart for details).  MDM Reviewed: previous chart, nursing note and vitals Reviewed previous: labs and ECG Interpretation: labs, x-ray and ECG   Results for orders placed or performed during the hospital encounter of 11/16/17  Comprehensive metabolic panel  Result Value Ref Range   Sodium 140 135 - 145 mmol/L   Potassium 3.1 (L) 3.5 - 5.1 mmol/L   Chloride 104 101 - 111 mmol/L   CO2 25 22 - 32 mmol/L   Glucose, Bld 121 (H) 65 - 99 mg/dL   BUN 10 6 - 20 mg/dL   Creatinine, Ser 1.02 0.61 - 1.24 mg/dL   Calcium 8.8 (L) 8.9 - 10.3 mg/dL   Total Protein 6.6 6.5 - 8.1 g/dL   Albumin 3.8 3.5 - 5.0 g/dL   AST 20 15 - 41 U/L   ALT 13 (L) 17 - 63 U/L   Alkaline Phosphatase 86 38 - 126 U/L   Total Bilirubin 0.6 0.3 - 1.2 mg/dL    GFR calc non Af Amer >60 >60 mL/min   GFR calc Af Amer >60 >60 mL/min   Anion gap 11 5 -  15  CBC with Differential  Result Value Ref Range   WBC 4.7 4.0 - 10.5 K/uL   RBC 5.08 4.22 - 5.81 MIL/uL   Hemoglobin 15.0 13.0 - 17.0 g/dL   HCT 45.2 39.0 - 52.0 %   MCV 89.0 78.0 - 100.0 fL   MCH 29.5 26.0 - 34.0 pg   MCHC 33.2 30.0 - 36.0 g/dL   RDW 13.2 11.5 - 15.5 %   Platelets 169 150 - 400 K/uL   Neutrophils Relative % 71 %   Neutro Abs 3.4 1.7 - 7.7 K/uL   Lymphocytes Relative 19 %   Lymphs Abs 0.9 0.7 - 4.0 K/uL   Monocytes Relative 6 %   Monocytes Absolute 0.3 0.1 - 1.0 K/uL   Eosinophils Relative 4 %   Eosinophils Absolute 0.2 0.0 - 0.7 K/uL   Basophils Relative 0 %   Basophils Absolute 0.0 0.0 - 0.1 K/uL  Protime-INR  Result Value Ref Range   Prothrombin Time 13.0 11.4 - 15.2 seconds   INR 0.99   Troponin I  Result Value Ref Range   Troponin I <0.03 <0.03 ng/mL  POC occult blood, ED  Result Value Ref Range   Fecal Occult Bld POSITIVE (A) NEGATIVE  Type and screen [facility]  Result Value Ref Range   ABO/RH(D) O NEG    Antibody Screen NEG    Sample Expiration      11/19/2017 Performed at [facility], [address] [place], [place] [phone]    Dg Abd Acute W/chest Result Date: 11/16/2017 CLINICAL DATA:  Gastrointestinal bleeding beginning this morning. EXAM: DG ABDOMEN ACUTE W/ 1V CHEST COMPARISON:  CT abdomen and pelvis 08/15/2016. PA and lateral chest 05/02/2016. FINDINGS: Single-view of the chest demonstrates clear lungs and normal heart size. No pneumothorax or pleural effusion. Two views of the abdomen show no free intraperitoneal air. The bowel gas pattern is nonobstructive. Large volume of stool ascending colon is seen. Cholecystectomy clips are identified. Multilevel thoracic and lumbar spondylosis is noted. IMPRESSION: No acute finding chest or abdomen. Large stool burden ascending colon. Electronically Signed   By: Inge Rise M.D.   On: 11/16/2017  08:28    0855:  H/H stable. VS remain stable. Given hx recent daily BC powders use, IV protonix dosed. Pt has seen both GI Dr. Gala Romney and Dr. Laural Golden; family and pt both request Dr. Laural Golden. Dx and testing d/w pt and family.  Questions answered.  Verb understanding, agreeable to admit. T/C returned from GI Dr. Laural Golden, case discussed, including:  HPI, pertinent PM/SHx, VS/PE, dx testing, ED course and treatment:  Agreeable to consult, requests clears OK today, will perform EGD tomorrow.   0920:  T/C returned from [facility] Dr. Clementeen Graham, case discussed, including:  HPI, pertinent PM/SHx, VS/PE, dx testing, ED course and treatment:  Agreeable to admit.     Final Clinical Impressions(s) / ED Diagnoses   Final diagnoses:  None    ED Discharge Orders    None       Francine Graven, DO 11/18/17 2155

## 2017-11-16 NOTE — H&P (Addendum)
TRH H&P   Patient Demographics:    Jeremy [name], is a 82 y.o. male  MRN: [id number]   DOB - 09-21-1935  Admit Date - 11/16/2017  Outpatient Primary MD for the patient is Jani Gravel, MD  Referring MD: Dr. Thurnell Garbe  Outpatient Specialists: Dr. Harl Bowie (cardiology, Dr. Laural Golden (GI)  Patient coming from: Home  Chief Complaint  Patient presents with  . Rectal Bleeding      HPI:    Jeremy [name]  is a 82 y.o. male, with history of coronary artery disease stent in 1996, nuclear study in 2015 with EF of 47% without ischemia, history of benign anal rectal bleeding with colonoscopy in 2014, EGD in 2016 for dyspepsia with findings of ulcerative/erosive reflux esophagitis and dilatation of peptic stricture with positive H. pylori that was treated presented to the ED with painless rectal bleed of 4-5 episodes since this morning.  Patient reports noticing bright red to dark maroon blood mixed with stool initially followed by another 4 episodes of bright red blood.  Denies any abdominal pain or cramping, hematemesis or vomiting.  Reports some nausea.  Denies any chest pain, shortness of breath, dizziness, lightheadedness, fevers, chills, headaches, blurred vision, dysuria, tingling or numbness of his extremities.  He reports that he has noticed almost 10-12 pounds unintentional weight loss in the past 12 months which he thinks is due to not eating properly.  He reports having sensations of food sticking in his esophagus (mainly meat) which is similar to his prior symptoms of peptic stricture. Patient and his wife report that he has been taking BC powder, Excedrin Migraine (contents to 50 mg aspirin) and ibuprofen about 2-3 tablets of one of them for ongoing sinus headache for the past 1 month. In the ED vitals were stable except for elevated blood pressure.  Rectal exam per ED physician showed maroon  blood.  Blood work showed normal hemoglobin, potassium of 3.1.  LFTs were normal.  INR was normal.  Type and screen done and given a dose of IV PPI. GI consulted who recommended hospitalist admission and will follow.       Review of systems:    In addition to the HPI above No Fever-chills, No Headache, No changes with Vision or hearing, Difficulty swallowing some of the solids (especially meat) No Chest pain, Cough or Shortness of Breath, No Abdominal pain, no vomiting, nausea +, Bowel movements are regular, Blood in stool, no hematuria No dysuria, No new skin rashes or bruises, No new joints pains-aches,  No new weakness, tingling, numbness in any extremity, Recent weight loss No polyuria, polydypsia or polyphagia,   A full 10 point Review of Systems was done, except as stated above, all other Review of Systems were negative.   With Past History of the following :    Past Medical History:  Diagnosis Date  . Abnormal CXR 4/20011  Chest  CT mode/serere COPD ( no mediastinal abnormality)  . CAD (coronary artery disease) 2005   anterior MI with stent to the LAD in 1991 / stent 1996 / nuclear 2005, no ischemia  . Carotid bruit    Doppler, June, 2013, 0-39% bilateral  . COPD (chronic obstructive pulmonary disease) ([place])   . Diverticula of colon   . Ejection fraction    55-60%, echo, April, 2011, could not estimate RV pressure  . Esophagitis   . HOH (hard of hearing)   . Hyperlipidemia   . Hypertension   . Myocardial infarction ([place])   . Shortness of breath    with exertion  . Sleep apnea    Stop Bang score of 5  . Tobacco abuse       Past Surgical History:  Procedure Laterality Date  . APPENDECTOMY    . CARDIAC CATHETERIZATION    . CATARACT EXTRACTION Left   . CATARACT EXTRACTION W/PHACO Right 01/22/2014   Procedure: CATARACT EXTRACTION PHACO AND INTRAOCULAR LENS PLACEMENT (IOC);  Surgeon: Tonny Branch, MD;  Location: [facility];  Service: Ophthalmology;  Laterality:  Right;  CDE 7.46  . CHOLECYSTECTOMY     8 years ago [facility]  . COLONOSCOPY  10/05/2012   Dr. Laural Golden: multiple diverticula at sigmoid colon, multiple polyps (tubular adenomas), small external hemorrhoids  . CYST REMOVAL HAND     right wrist  . ESOPHAGEAL DILATION N/A 05/23/2015   Procedure: ESOPHAGEAL DILATION;  Surgeon: Daneil Dolin, MD;  Location: [facility];  Service: Endoscopy;  Laterality: N/A;  . ESOPHAGOGASTRODUODENOSCOPY N/A 05/23/2015   Dr. Gala Romney: Ulcerative/ erosive reflux esophagitis with peptic stricture formation. Status post dilation as described. Hiatal hernia. Abnormal gastric mucosa of uncertain significance status post gastric biospy. +H.pylori  . PERCUTANEOUS STENT INTERVENTION    . SKIN CANCER EXCISION  2015   ear & neck  . TONSILLECTOMY        Social History:     Social History   Tobacco Use  . Smoking status: Former Smoker    Packs/day: 3.00    Years: 12.00    Pack years: 36.00    Types: Cigarettes    Start date: 09/15/1983    Last attempt to quit: 09/14/1994    Years since quitting: 23.1  . Smokeless tobacco: Current User    Types: Chew  . Tobacco comment: chews 80% of a pack tobacco per day -- 10/12 doesnt chew too much  Substance Use Topics  . Alcohol use: No    Alcohol/week: 0.0 oz     Lives -home with wife  Mobility -independent     Family History :     Family History  Problem Relation Age of Onset  . Heart disease Mother   . Heart disease Father   . Heart disease Brother   . Stomach cancer Brother   . Colon cancer Neg Hx       Home Medications:   Prior to Admission medications   Medication Sig Start Date End Date Taking? Authorizing Provider  albuterol (PROVENTIL HFA;VENTOLIN HFA) 108 (90 Base) MCG/ACT inhaler Inhale 1-2 puffs into the lungs every 6 (six) hours as needed for wheezing or shortness of breath.    [provider]  aspirin 81 MG tablet Take 81 mg by mouth every evening.     [provider]    aspirin-acetaminophen-caffeine (EXCEDRIN MIGRAINE) [phone] MG tablet Take 1 tablet by mouth every 6 (six) hours as needed (pain).    [provider]  Aspirin-Salicylamide-Caffeine (ARTHRITIS STRENGTH BC POWDER PO) Take 1 Package by mouth daily as needed (pain).    [provider]  ibuprofen (ADVIL,MOTRIN) 200 MG tablet Take 400 mg by mouth every 6 (six) hours as needed for moderate pain.    [provider]  nitroGLYCERIN (NITROSTAT) 0.4 MG SL tablet PLACE 1 TABLET UNDER THE TONGUE EVERY 5 MINUTES FOR 3 DOSES AS NEEDED FOR CHEST PAIN 08/11/16   Arnoldo Lenis, MD  simvastatin (ZOCOR) 40 MG tablet TAKE 1 TABLET BY MOUTH EVERY EVENING 04/22/17   Arnoldo Lenis, MD  tiotropium (SPIRIVA HANDIHALER) 18 MCG inhalation capsule Place 1 capsule (18 mcg total) into inhaler and inhale daily. Patient taking differently: Place 18 mcg into inhaler and inhale daily as needed (for shortness of breath).  11/11/15   Arnoldo Lenis, MD     Allergies:    No Known Allergies   Physical Exam:   Vitals  Blood pressure (!) 165/84, pulse 67, temperature 98.6 F (37 C), temperature source Oral, resp. rate 20, height 6' (1.829 m), weight 86 kg (189 lb 9.5 oz), SpO2 95 %.   General: Elderly male lying in bed in no acute distress HEENT: No pallor, pupils reactive bilaterally, EOMI, no icterus, moist oral mucosa, supple neck, no cervical lymphadenopathy Chest: Clear to auscultation bilaterally, no added sound CVS: Normal S1-S2, no murmur rub or gallop GI: Soft, nondistended, nontender, bowel sounds present Musculoskeletal: Warm, no edema, normal skin CNS: Alert and oriented, nonfocal    Data Review:    CBC Recent Labs  Lab 11/16/17 0744  WBC 4.7  HGB 15.0  HCT 45.2  PLT 169  MCV 89.0  MCH 29.5  MCHC 33.2  RDW 13.2  LYMPHSABS 0.9  MONOABS 0.3  EOSABS 0.2  BASOSABS 0.0    ------------------------------------------------------------------------------------------------------------------  Chemistries  Recent Labs  Lab 11/16/17 0745  NA 140  K 3.1*  CL 104  CO2 25  GLUCOSE 121*  BUN 10  CREATININE 1.02  CALCIUM 8.8*  AST 20  ALT 13*  ALKPHOS 86  BILITOT 0.6   ------------------------------------------------------------------------------------------------------------------ estimated creatinine clearance is 64.5 mL/min (by C-G formula based on SCr of 1.02 mg/dL). ------------------------------------------------------------------------------------------------------------------ No results for input(s): TSH, T4TOTAL, T3FREE, THYROIDAB in the last 72 hours.  Invalid input(s): FREET3  Coagulation profile Recent Labs  Lab 11/16/17 0744  INR 0.99   ------------------------------------------------------------------------------------------------------------------- No results for input(s): DDIMER in the last 72 hours. -------------------------------------------------------------------------------------------------------------------  Cardiac Enzymes Recent Labs  Lab 11/16/17 0745  TROPONINI <0.03   ------------------------------------------------------------------------------------------------------------------ No results found for: BNP   ---------------------------------------------------------------------------------------------------------------  Urinalysis    Component Value Date/Time   COLORURINE YELLOW 08/15/2016 1250   APPEARANCEUR CLEAR 08/15/2016 1250   LABSPEC 1.015 08/15/2016 1250   PHURINE 7.0 08/15/2016 1250   GLUCOSEU NEGATIVE 08/15/2016 1250   HGBUR NEGATIVE 08/15/2016 1250   BILIRUBINUR NEGATIVE 08/15/2016 1250   KETONESUR NEGATIVE 08/15/2016 1250   PROTEINUR NEGATIVE 08/15/2016 1250   UROBILINOGEN 0.2 10/22/2013 0820   NITRITE NEGATIVE 08/15/2016 1250   LEUKOCYTESUR NEGATIVE 08/15/2016 1250     ----------------------------------------------------------------------------------------------------------------   Imaging Results:    Dg Abd Acute W/chest  Result Date: 11/16/2017 CLINICAL DATA:  Gastrointestinal bleeding beginning this morning. EXAM: DG ABDOMEN ACUTE W/ 1V CHEST COMPARISON:  CT abdomen and pelvis 08/15/2016. PA and lateral chest 05/02/2016. FINDINGS: Single-view of the chest demonstrates clear lungs and normal heart size. No pneumothorax or pleural effusion. Two views of the abdomen show no free intraperitoneal air. The bowel gas pattern is nonobstructive.  Large volume of stool ascending colon is seen. Cholecystectomy clips are identified. Multilevel thoracic and lumbar spondylosis is noted. IMPRESSION: No acute finding chest or abdomen. Large stool burden ascending colon. Electronically Signed   By: Inge Rise M.D.   On: 11/16/2017 08:28    My personal review of EKG: Sinus rhythm at 79, no ST-T changes   Assessment & Plan:    Principal Problem:   Rectal bleed Diverticular bleed versus upper GI bleed due to significant use of NSAIDs for the past month (BC powder, Excedrin and ibuprofen). Placed on observation on telemetry.  Serial H&H every 8 hours.  Type and screen sent.  Does not need transfusion at this time. IV PPI twice daily.  Clear liquids.  N.p.o. after midnight for possible colonoscopy+/-EGD. -GI consulted and will follow up with recommendations.  Active Problems: Essential hypertension Not on medications at home.  Will order as needed hydralazine.    CAD (coronary artery disease) History of cardiac cath several years ago.  Hold aspirin.  Continue statin.  Follows with Dr. Harl Bowie.    Tobacco abuse Quit smoking.  Now uses chewing tobacco.  Counseled on cessation.    COPD (chronic obstructive pulmonary disease) (HCC) Stable.  Continue home inhalers.    Reflux esophagitis   Peptic stricture of esophagus/ H. pylori Had EGD with dilatation in 2016  and treated for H. pylori..     Hypokalemia Replenished.  Weight loss in past 1 year -about 10-12 pounds of unintentional weight loss  follow-up with GI workup.   DVT Prophylaxis: SCDs  AM Labs Ordered, also please review Full Orders  Family Communication: Admission, patients condition and plan of care including tests being ordered have been discussed with the patient, his wife and son at bedside.   Code Status full code  Likely DC to home after workup completed and hemodynamically stable  Condition: Fair  Consults called: GI (Dr Laural Golden)  Admission status: Observation  Time spent in minutes :50   Pratham Cassatt M.D on 11/16/2017 at 11:49 AM  Between 7am to 7pm - Pager - [phone]. After 7pm go to [email or web address] - password [facility]  [facility] - Office  [phone]

## 2017-11-16 NOTE — Consult Note (Signed)
Referring Provider: Louellen Molder, MD  Primary Care Physician:  Jani Gravel, MD Primary Gastroenterologist:  Dr. Laural Golden  Reason for Consultation:    GI bleed.  HPI:   Patient is 82 year old Caucasian male who has history of erosive reflux esophagitis and esophageal stricture who stopped PPIs several months ago was in usual state of health until last night when he experienced some nausea but he did not have any other symptoms.  Patient woke up around 4:30 AM and passed black stool.  He had few more bowel movements every 30-45 minutes when he passed more tarry stools and he also noted some fresh blood.  He did not experience nausea vomiting or abdominal pain.  He did notice mild lightheadedness.  He did not experience chest pain or shortness of breath.  Around 7 AM he asked his wife to bring him to emergency room.  Patient was evaluated by Dr. Thurnell Garbe and noted to be hemodynamically stable.  Rectal examination reveals maroonish stool in the rectum. The patient's H&H was 15 and 45.2.  Platelet count was 169K and INR was 0.99.  Patient was type and screen and hospitalized for further management.  He was begun on IV pantoprazole. Patient has gone about 6 hours and has not had any more bowel movements.  He has no difficulty with clear liquids. According to his wife he has been having frequent heartburn and he also has been experience dysphagia with solids primarily with meat.  He has been taking OTC NSAIDs in the form of BC powder Advil Excedrin Migraine before what is felt to be sinus headache.  He has not gone to see his primary care physician.  His wife states he does not have a good appetite.  He has lost 12-15 pounds in 1 year.  He generally eats 2 meals a day. He is retired but he stays busy.  He is also on low-dose aspirin. He has a history of coronary artery disease with stenting back in 1996 and he sees Dr. Harl Bowie every year.  He has a history of colonic polyps.  He had 4 polyps on his  colonoscopy of 2010 and he had 11 polyps removed on his last colonoscopy of January 2014 and 6 of these polyps are tubular adenomas.  He is in for recall this year.  Last EGD with ED was in September 2016 by Dr. Gala Romney for dysphagia.  He was noted to have erosive/ulcerative reflux esophagitis with stricture at GE junction which was dilated with 52 [place] Maloney dilator.  He also had small sliding hiatal hernia.   Father died of MI at age 80.  Mother was diabetic and lived to be 48.  He had 2 brothers and one sister and they are all disease.  Both brothers had coronary artery disease.  One also had rare blood disorder and died in his late 39s.  No further details available.  Other brother was 57 when he passed away.  Sister had dementia and died at 61.  He is retired from [employer] where he worked for 33-1/2 years.  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  He has 2 sons living.  One son died of auto accident at age 28. He smoked 1-3 packs of cigarettes per day for 40 years.  He quit over 20 years ago.  He does not drink alcohol.    Past Medical History:  Diagnosis Date  .  4/20011     . CAD (coronary artery disease) 2005  anterior MI with stent to the LAD in 1991 / stent 1996 / nuclear 2005, no ischemia  . Carotid bruit    Doppler, June, 2013, 0-39% bilateral  . COPD (chronic obstructive pulmonary disease) ([place])   . Diverticula of colon   . Ejection fraction    55-60%, echo, April, 2011, could not estimate RV pressure  . Esophagitis   . HOH (hard of hearing)   . Hyperlipidemia   . Hypertension   .    .       . Sleep apnea    Stop Bang score of 5        Past Surgical History:  Procedure Laterality Date  . APPENDECTOMY    . CARDIAC CATHETERIZATION    . CATARACT EXTRACTION Left   . CATARACT EXTRACTION W/PHACO Right 01/22/2014   Procedure: CATARACT EXTRACTION PHACO AND INTRAOCULAR LENS PLACEMENT (IOC);  Surgeon: Tonny Branch, MD;  Location: [facility];  Service: Ophthalmology;   Laterality: Right;  CDE 7.46  . CHOLECYSTECTOMY     8 years ago [facility]  . COLONOSCOPY  10/05/2012   Dr. Laural Golden: multiple diverticula at sigmoid colon, multiple polyps (tubular adenomas), small external hemorrhoids  . CYST REMOVAL HAND     right wrist  . ESOPHAGEAL DILATION N/A 05/23/2015   Procedure: ESOPHAGEAL DILATION;  Surgeon: Daneil Dolin, MD;  Location: [facility];  Service: Endoscopy;  Laterality: N/A;  . ESOPHAGOGASTRODUODENOSCOPY N/A 05/23/2015   Dr. Gala Romney: Ulcerative/ erosive reflux esophagitis with peptic stricture formation. Status post dilation as described. Hiatal hernia. Abnormal gastric mucosa of uncertain significance status post gastric biospy. +H.pylori  . PERCUTANEOUS STENT INTERVENTION    . SKIN CANCER EXCISION  2015   ear & neck  . TONSILLECTOMY      Prior to Admission medications   Medication Sig Start Date End Date Taking? Authorizing Provider  albuterol (PROVENTIL HFA;VENTOLIN HFA) 108 (90 Base) MCG/ACT inhaler Inhale 1-2 puffs into the lungs every 6 (six) hours as needed for wheezing or shortness of breath.    [provider]  aspirin 81 MG tablet Take 81 mg by mouth every evening.     [provider]  aspirin-acetaminophen-caffeine (EXCEDRIN MIGRAINE) [phone] MG tablet Take 1 tablet by mouth every 6 (six) hours as needed (pain).    [provider]  Aspirin-Salicylamide-Caffeine (ARTHRITIS STRENGTH BC POWDER PO) Take 1 Package by mouth daily as needed (pain).    [provider]  ibuprofen (ADVIL,MOTRIN) 200 MG tablet Take 400 mg by mouth every 6 (six) hours as needed for moderate pain.    [provider]  nitroGLYCERIN (NITROSTAT) 0.4 MG SL tablet PLACE 1 TABLET UNDER THE TONGUE EVERY 5 MINUTES FOR 3 DOSES AS NEEDED FOR CHEST PAIN 08/11/16   Arnoldo Lenis, MD  simvastatin (ZOCOR) 40 MG tablet TAKE 1 TABLET BY MOUTH EVERY EVENING 04/22/17   Arnoldo Lenis, MD  tiotropium (SPIRIVA HANDIHALER) 18 MCG inhalation  capsule Place 1 capsule (18 mcg total) into inhaler and inhale daily. Patient taking differently: Place 18 mcg into inhaler and inhale daily as needed (for shortness of breath).  11/11/15   Arnoldo Lenis, MD    Current Facility-Administered Medications  Medication Dose Route Frequency Provider Last Rate Last Dose  . acetaminophen (TYLENOL) tablet 650 mg  650 mg Oral Q6H PRN Dhungel, Nishant, MD       Or  . acetaminophen (TYLENOL) suppository 650 mg  650 mg Rectal Q6H PRN Dhungel, Nishant, MD      .  albuterol (PROVENTIL) (2.5 MG/3ML) 0.083% nebulizer solution 3 mL  3 mL Inhalation Q6H PRN Dhungel, Nishant, MD      . nitroGLYCERIN (NITROSTAT) SL tablet 0.4 mg  0.4 mg Sublingual Q5 min PRN Dhungel, Nishant, MD      . ondansetron (ZOFRAN) tablet 4 mg  4 mg Oral Q6H PRN Dhungel, Nishant, MD       Or  . ondansetron (ZOFRAN) injection 4 mg  4 mg Intravenous Q6H PRN Dhungel, Nishant, MD      . pantoprazole (PROTONIX) injection 40 mg  40 mg Intravenous Q12H Dhungel, Nishant, MD      . simvastatin (ZOCOR) tablet 40 mg  40 mg Oral QPM Dhungel, Nishant, MD      . sodium chloride flush (NS) 0.9 % injection 3 mL  3 mL Intravenous Q12H Dhungel, Nishant, MD   3 mL at 11/16/17 1211  . tiotropium (SPIRIVA) inhalation capsule 18 mcg  18 mcg Inhalation Daily Dhungel, Nishant, MD        Allergies as of 11/16/2017  . (No Known Allergies)    Family History  Problem Relation Age of Onset  . Heart disease Mother   . Heart disease Father   . Heart disease Brother   . Stomach cancer Brother   . Colon cancer Neg Hx     Social History   Socioeconomic History  . Marital status: Married    Spouse name: Not on file  . Number of children: Not on file  . Years of education: Not on file  . Highest education level: Not on file  Social Needs  . Financial resource strain: Not on file  . Food insecurity - worry: Not on file  . Food insecurity - inability: Not on file  . Transportation needs - medical: Not  on file  . Transportation needs - non-medical: Not on file  Occupational History  . Not on file  Tobacco Use  . Smoking status: Former Smoker    Packs/day: 3.00    Years: 12.00    Pack years: 36.00    Types: Cigarettes    Start date: 09/15/1983    Last attempt to quit: 09/14/1994    Years since quitting: 23.1  . Smokeless tobacco: Current User    Types: Chew  . Tobacco comment: chews 80% of a pack tobacco per day -- 10/12 doesnt chew too much  Substance and Sexual Activity  . Alcohol use: No    Alcohol/week: 0.0 oz  . Drug use: No  . Sexual activity: Yes    Partners: Female  Other Topics Concern  . Not on file  Social History Narrative  . Not on file    Review of Systems: See HPI, otherwise normal ROS  Physical Exam: Temp:  [98 F (36.7 C)-98.6 F (37 C)] 98.6 F (37 C) (03/05 1032) Pulse Rate:  [67-88] 67 (03/05 1032) Resp:  [18-26] 20 (03/05 1032) BP: (165-183)/(84-98) 165/84 (03/05 1032) SpO2:  [93 %-97 %] 95 % (03/05 1032) Weight:  [180 lb (81.6 kg)-189 lb 9.5 oz (86 kg)] 189 lb 9.5 oz (86 kg) (03/05 1032) Last BM Date: 11/16/17  Patient is alert and in no acute distress. He has hearing impairment. Conjunctiva is pink.  Sclerae nonicteric. Oropharyngeal mucosa is normal.  He has upper dentures in place.  He is edentulous and lower jaw.  He uses lower dentures only at mealtime. No neck masses or thyromegaly noted. Heart exam with regular rhythm normal S1 and S2.  No murmur or gallop  noted. Auscultation of lungs reveal vesicular breath sounds bilaterally.  No rales or rhonchi noted. Abdomen is symmetrical.  Small ecchymosis noted in right mid abdomen at the level of the umbilicus.  Bowel sounds are normal.  On palpation abdomen is soft and nontender without organomegaly or masses. No peripheral edema or clubbing noted.  Lab Results: Recent Labs    11/16/17 0744 11/16/17 1208  WBC 4.7  --   HGB 15.0 14.4  HCT 45.2 43.8  PLT 169  --    BMET Recent Labs     11/16/17 0745  NA 140  K 3.1*  CL 104  CO2 25  GLUCOSE 121*  BUN 10  CREATININE 1.02  CALCIUM 8.8*   LFT Recent Labs    11/16/17 0745  PROT 6.6  ALBUMIN 3.8  AST 20  ALT 13*  ALKPHOS 86  BILITOT 0.6   PT/INR Recent Labs    11/16/17 0744  LABPROT 13.0  INR 0.99   Hepatitis Panel No results for input(s): HEPBSAG, HCVAB, HEPAIGM, HEPBIGM in the last 72 hours.  Studies/Results: Dg Abd Acute W/chest  Result Date: 11/16/2017 CLINICAL DATA:  Gastrointestinal bleeding beginning this morning. EXAM: DG ABDOMEN ACUTE W/ 1V CHEST COMPARISON:  CT abdomen and pelvis 08/15/2016. PA and lateral chest 05/02/2016. FINDINGS: Single-view of the chest demonstrates clear lungs and normal heart size. No pneumothorax or pleural effusion. Two views of the abdomen show no free intraperitoneal air. The bowel gas pattern is nonobstructive. Large volume of stool ascending colon is seen. Cholecystectomy clips are identified. Multilevel thoracic and lumbar spondylosis is noted. IMPRESSION: No acute finding chest or abdomen. Large stool burden ascending colon. Electronically Signed   By: Inge Rise M.D.   On: 11/16/2017 08:28    Assessment;  Patient is 82 year old Caucasian male who presents with acute GI bleed.  To begin with he had tarry stool and towards the hand he also passed some fresh blood.  He has a history of erosive reflux esophagitis with a stricture.  He is on low-dose aspirin but over the last 4 weeks he has been using OTC NSAIDs every day for "sinus headache".  He most likely is bleeding from his upper GI tract.  Less likely sources would be NSAID injury to small bowel or proximal colon.  He is hemodynamically stable.  He is on IV pantoprazole.  If no source of GI bleed noted on EGD would consider colonoscopy during this admission otherwise will be delayed.   History of multiple colonic adenomas.  Last colonoscopy was in January 2014 and he has due for one this  year.  Recommendations;  Diagnostic esophagogastroduodenoscopy with esophageal dilation in a.m unless contraindicated because of findings.   LOS: 0 days   Najeeb Rehman  11/16/2017, 1:44 PM

## 2017-11-16 NOTE — Care Management Obs Status (Signed)
[place] NOTIFICATION   Patient Details  Name: Jeremy [name] MRN: [id number] Date of Birth: [birth year]   Medicare Observation Status Notification Given:  Yes    Sherald Barge, RN 11/16/2017, 2:11 PM

## 2017-11-17 ENCOUNTER — Encounter (HOSPITAL_COMMUNITY): Payer: Self-pay

## 2017-11-17 ENCOUNTER — Encounter (HOSPITAL_COMMUNITY)
Admission: EM | Disposition: A | Discharge status: Home / Self Care | Payer: Self-pay | Source: Home / Self Care | Attending: Internal Medicine

## 2017-11-17 ENCOUNTER — Other Ambulatory Visit: Payer: Self-pay

## 2017-11-17 DIAGNOSIS — K2950 Unspecified chronic gastritis without bleeding: Secondary | ICD-10-CM | POA: Diagnosis present

## 2017-11-17 DIAGNOSIS — K222 Esophageal obstruction: Secondary | ICD-10-CM

## 2017-11-17 DIAGNOSIS — E876 Hypokalemia: Secondary | ICD-10-CM

## 2017-11-17 DIAGNOSIS — K922 Gastrointestinal hemorrhage, unspecified: Secondary | ICD-10-CM | POA: Diagnosis not present

## 2017-11-17 DIAGNOSIS — T39395A Adverse effect of other nonsteroidal anti-inflammatory drugs [NSAID], initial encounter: Secondary | ICD-10-CM | POA: Diagnosis not present

## 2017-11-17 DIAGNOSIS — K449 Diaphragmatic hernia without obstruction or gangrene: Secondary | ICD-10-CM | POA: Diagnosis not present

## 2017-11-17 DIAGNOSIS — B9681 Helicobacter pylori [H. pylori] as the cause of diseases classified elsewhere: Secondary | ICD-10-CM | POA: Diagnosis present

## 2017-11-17 DIAGNOSIS — K210 Gastro-esophageal reflux disease with esophagitis: Secondary | ICD-10-CM | POA: Diagnosis present

## 2017-11-17 DIAGNOSIS — H9190 Unspecified hearing loss, unspecified ear: Secondary | ICD-10-CM | POA: Diagnosis present

## 2017-11-17 DIAGNOSIS — Z720 Tobacco use: Secondary | ICD-10-CM

## 2017-11-17 DIAGNOSIS — I252 Old myocardial infarction: Secondary | ICD-10-CM | POA: Diagnosis not present

## 2017-11-17 DIAGNOSIS — R634 Abnormal weight loss: Secondary | ICD-10-CM | POA: Diagnosis present

## 2017-11-17 DIAGNOSIS — I2510 Atherosclerotic heart disease of native coronary artery without angina pectoris: Secondary | ICD-10-CM | POA: Diagnosis not present

## 2017-11-17 DIAGNOSIS — J449 Chronic obstructive pulmonary disease, unspecified: Secondary | ICD-10-CM

## 2017-11-17 DIAGNOSIS — I10 Essential (primary) hypertension: Secondary | ICD-10-CM | POA: Diagnosis not present

## 2017-11-17 DIAGNOSIS — Z955 Presence of coronary angioplasty implant and graft: Secondary | ICD-10-CM | POA: Diagnosis not present

## 2017-11-17 DIAGNOSIS — K5730 Diverticulosis of large intestine without perforation or abscess without bleeding: Secondary | ICD-10-CM | POA: Diagnosis not present

## 2017-11-17 DIAGNOSIS — K2970 Gastritis, unspecified, without bleeding: Secondary | ICD-10-CM | POA: Diagnosis not present

## 2017-11-17 DIAGNOSIS — K2951 Unspecified chronic gastritis with bleeding: Secondary | ICD-10-CM | POA: Diagnosis not present

## 2017-11-17 DIAGNOSIS — Z86010 Personal history of colonic polyps: Secondary | ICD-10-CM | POA: Diagnosis not present

## 2017-11-17 DIAGNOSIS — Z9119 Patient's noncompliance with other medical treatment and regimen: Secondary | ICD-10-CM | POA: Diagnosis not present

## 2017-11-17 DIAGNOSIS — G4730 Sleep apnea, unspecified: Secondary | ICD-10-CM | POA: Diagnosis present

## 2017-11-17 DIAGNOSIS — F17220 Nicotine dependence, chewing tobacco, uncomplicated: Secondary | ICD-10-CM | POA: Diagnosis present

## 2017-11-17 DIAGNOSIS — Z8249 Family history of ischemic heart disease and other diseases of the circulatory system: Secondary | ICD-10-CM | POA: Diagnosis not present

## 2017-11-17 DIAGNOSIS — J310 Chronic rhinitis: Secondary | ICD-10-CM | POA: Diagnosis present

## 2017-11-17 DIAGNOSIS — Z85828 Personal history of other malignant neoplasm of skin: Secondary | ICD-10-CM | POA: Diagnosis not present

## 2017-11-17 DIAGNOSIS — K5731 Diverticulosis of large intestine without perforation or abscess with bleeding: Secondary | ICD-10-CM | POA: Diagnosis not present

## 2017-11-17 DIAGNOSIS — K625 Hemorrhage of anus and rectum: Secondary | ICD-10-CM | POA: Diagnosis not present

## 2017-11-17 DIAGNOSIS — K648 Other hemorrhoids: Secondary | ICD-10-CM | POA: Diagnosis not present

## 2017-11-17 DIAGNOSIS — E785 Hyperlipidemia, unspecified: Secondary | ICD-10-CM | POA: Diagnosis present

## 2017-11-17 DIAGNOSIS — R1314 Dysphagia, pharyngoesophageal phase: Secondary | ICD-10-CM | POA: Diagnosis not present

## 2017-11-17 DIAGNOSIS — K209 Esophagitis, unspecified: Secondary | ICD-10-CM | POA: Diagnosis not present

## 2017-11-17 HISTORY — PX: ESOPHAGOGASTRODUODENOSCOPY: SHX5428

## 2017-11-17 HISTORY — PX: ESOPHAGEAL DILATION: SHX303

## 2017-11-17 HISTORY — PX: BIOPSY: SHX5522

## 2017-11-17 LAB — BASIC METABOLIC PANEL
Anion gap: 9 (ref 5–15)
BUN: 10 mg/dL (ref 6–20)
CO2: 27 mmol/L (ref 22–32)
Calcium: 8.5 mg/dL — ABNORMAL LOW (ref 8.9–10.3)
Chloride: 105 mmol/L (ref 101–111)
Creatinine, Ser: 1.11 mg/dL (ref 0.61–1.24)
GFR calc Af Amer: >60 mL/min (ref >60)
GFR calc non Af Amer: >60 mL/min (ref >60)
GLUCOSE: 85 mg/dL (ref 65–99)
POTASSIUM: 3.7 mmol/L (ref 3.5–5.1)
Sodium: 141 mmol/L (ref 135–145)

## 2017-11-17 LAB — HEMOGLOBIN AND HEMATOCRIT, BLOOD
HCT: 43.6 % (ref 39.0–52.0)
Hemoglobin: 14.2 g/dL (ref 13.0–17.0)

## 2017-11-17 SURGERY — EGD (ESOPHAGOGASTRODUODENOSCOPY)
Anesthesia: Moderate Sedation

## 2017-11-17 MED ORDER — MIDAZOLAM HCL 5 MG/5ML IJ SOLN
INTRAMUSCULAR | Status: DC | PRN
  Administered 2017-11-17: 1 mg via INTRAVENOUS
  Administered 2017-11-17: 2 mg via INTRAVENOUS
  Administered 2017-11-17: 1 mg via INTRAVENOUS

## 2017-11-17 MED ORDER — STERILE WATER FOR IRRIGATION IR SOLN
Status: DC | PRN
  Administered 2017-11-17: 15:00:00

## 2017-11-17 MED ORDER — MIDAZOLAM HCL 5 MG/5ML IJ SOLN
INTRAMUSCULAR | Status: AC
  Filled 2017-11-17: qty 10

## 2017-11-17 MED ORDER — MEPERIDINE HCL 50 MG/ML IJ SOLN
INTRAMUSCULAR | Status: DC | PRN
  Administered 2017-11-17 (×2): 25 mg via INTRAVENOUS

## 2017-11-17 MED ORDER — SODIUM CHLORIDE 0.9 % IV SOLN
INTRAVENOUS | Status: DC
  Administered 2017-11-17 – 2017-11-18 (×3): via INTRAVENOUS

## 2017-11-17 MED ORDER — LIDOCAINE VISCOUS 2 % MT SOLN
OROMUCOSAL | Status: AC
  Filled 2017-11-17: qty 15

## 2017-11-17 MED ORDER — LIDOCAINE VISCOUS 2 % MT SOLN
OROMUCOSAL | Status: DC | PRN
  Administered 2017-11-17: 4 mL via OROMUCOSAL

## 2017-11-17 MED ORDER — PEG 3350-KCL-NA BICARB-NACL 420 G PO SOLR
4000.0000 mL | Freq: Once | ORAL | Status: AC
  Administered 2017-11-18: 4000 mL via ORAL
  Filled 2017-11-17 (×2): qty 4000

## 2017-11-17 MED ORDER — MEPERIDINE HCL 50 MG/ML IJ SOLN
INTRAMUSCULAR | Status: AC
  Filled 2017-11-17: qty 1

## 2017-11-17 NOTE — Progress Notes (Signed)
[facility] PROGRESS NOTE  Jeremy [name] YWV:[id number] DOB: [birth year] DOA: 11/16/2017 PCP: Jani Gravel, MD  Interim summary and H&P 82 y.o. male, with history of coronary artery disease stent in 1996, nuclear study in 2015 with EF of 47% without ischemia, history of benign anal rectal bleeding with colonoscopy in 2014, EGD in 2016 for dyspepsia with findings of ulcerative/erosive reflux esophagitis and dilatation of peptic stricture with positive H. pylori that was treated presented to the ED with painless rectal bleed of 4-5 episodes since the morning of admission. Patient reported combination of dark maroon blood, follow by fresh bright red blood episodes. Admitted for further evaluation and treatment.   Assessment/Plan: 1-Rectal bleed: -She denies any further signs of bleeding stools -Status post endoscopy which demonstrated esophagitis, gastritis, hiatal hernia and esophageal stricture but no active bleeding seen. -Dilatation provided -Patient advised to stop the use of NSAIDs -Will continue the use of PPI -Following GI recommendations plan is for colonoscopy tomorrow (3/7).  2-essential hypertension -Continue as needed hydralazine -At home no using any antihypertensive regimen -Low-sodium diet encouraged.  3-tobacco abuse -Patient reports that he quit smoking; he was congratulated. -Currently chewing tobacco and cessation counseling was provided.  4-COPD -Stable and currently without any wheezing or complaints of shortness of breath -Continue home inhaler regimen.  5-history of coronary artery disease -No chest pain -Currently holding aspirin in the setting of acute GI bleed. -Continue the use of statins -Patient to follow-up with cardiologist as an outpatient.  6-hypokalemia -Repleted -Follow basic metabolic panel to follow electrolytes.  7-esophageal stricture: With decreased p.o. intake and weight loss -Status post EGD and dilatation -Plan is for colonoscopy  tomorrow -Patient will benefit of nutritional services for evaluation and recommendations on feeding supplements.  Code Status: Full Family Communication: wife at bedside  Disposition Plan: remains in the hospital; continue PPI, bowel prep for colonoscopy tomorrow. Follow GI rec's.   Consultants:  GI  Procedures:  See below for x-ray reports  Endoscopy: Positive LA grade B esophagitis, benign-appearing esophageal stenosis, hiatal hernia and gastritis.  Antibiotics:  None  HPI/Subjective: Afebrile, denies chest pain, no shortness of breath.  Patient reports no further signs of bloody stools and is otherwise tolerating diet.  Objective: Vitals:   11/17/17 1515 11/17/17 1530  BP: 109/71 129/69  Pulse: 64 63  Resp: (!) 22 18  Temp:  97.8 F (36.6 C)  SpO2: (!) 89% 90%    Intake/Output Summary (Last 24 hours) at 11/17/2017 1757 Last data filed at 11/17/2017 1514 Gross per 24 hour  Intake 425 ml  Output -  Net 425 ml   Filed Weights   11/16/17 0737 11/16/17 1032  Weight: 81.6 kg (180 lb) 86 kg (189 lb 9.5 oz)    Exam:   General: Afebrile, no chest pain, no shortness of breath, no nausea, no vomiting.  Patient reports feeling good and denies seeing any further episode of blood in his stools.  Cardiovascular: S1 and S2, no rubs, no gallops, no JVD  Respiratory: Good air movement bilaterally, normal respiratory effort, good oxygen saturation on room air.  Abdomen: Soft, nontender, nondistended, positive bowel sounds.  Musculoskeletal: No edema, no cyanosis, no clubbing.  Data Reviewed: Basic Metabolic Panel: Recent Labs  Lab 11/16/17 0745 11/17/17 0401  NA 140 141  K 3.1* 3.7  CL 104 105  CO2 25 27  GLUCOSE 121* 85  BUN 10 10  CREATININE 1.02 1.11  CALCIUM 8.8* 8.5*   Liver Function Tests: Recent Labs  Lab  11/16/17 0745  AST 20  ALT 13*  ALKPHOS 86  BILITOT 0.6  PROT 6.6  ALBUMIN 3.8   CBC: Recent Labs  Lab 11/16/17 0744 11/16/17 1208  11/16/17 2002 11/17/17 0401  WBC 4.7  --   --   --   NEUTROABS 3.4  --   --   --   HGB 15.0 14.4 14.6 14.2  HCT 45.2 43.8 43.8 43.6  MCV 89.0  --   --   --   PLT 169  --   --   --    Cardiac Enzymes: Recent Labs  Lab 11/16/17 0745  TROPONINI <0.03   Studies: Dg Abd Acute W/chest  Result Date: 11/16/2017 CLINICAL DATA:  Gastrointestinal bleeding beginning this morning. EXAM: DG ABDOMEN ACUTE W/ 1V CHEST COMPARISON:  CT abdomen and pelvis 08/15/2016. PA and lateral chest 05/02/2016. FINDINGS: Single-view of the chest demonstrates clear lungs and normal heart size. No pneumothorax or pleural effusion. Two views of the abdomen show no free intraperitoneal air. The bowel gas pattern is nonobstructive. Large volume of stool ascending colon is seen. Cholecystectomy clips are identified. Multilevel thoracic and lumbar spondylosis is noted. IMPRESSION: No acute finding chest or abdomen. Large stool burden ascending colon. Electronically Signed   By: Inge Rise M.D.   On: 11/16/2017 08:28    Scheduled Meds: . pantoprazole (PROTONIX) IV  40 mg Intravenous Q12H  . [START ON 11/18/2017] polyethylene glycol-electrolytes  4,000 mL Oral Once  . simvastatin  40 mg Oral QPM  . sodium chloride flush  3 mL Intravenous Q12H  . tiotropium  18 mcg Inhalation Daily   Continuous Infusions: . sodium chloride 75 mL/hr at 11/17/17 1531    Principal Problem:   Rectal bleed Active Problems:   Hypertension   CAD (coronary artery disease)   Tobacco abuse   COPD (chronic obstructive pulmonary disease) (HCC)   Reflux esophagitis   Peptic stricture of esophagus   GI bleed   Hypokalemia    Time spent: 25 minutes    [facility] Pager [phone]. If 7PM-7AM, please contact night-coverage at [email or web address], password [email or web address] 11/17/2017, 5:57 PM  LOS: 0 days

## 2017-11-17 NOTE — Op Note (Signed)
[facility] Patient Name: Jeremy [name] Procedure Date: 11/17/2017 2:34 PM MRN: [id number] Date of Birth: 1936/08/01 Attending MD: Hildred Laser , MD CSN: [id number] Age: 82 Admit Type: Inpatient Procedure:                Upper GI endoscopy Indications:              Esophageal dysphagia, Hematochezia, Melena Providers:                Hildred Laser, MD, Otis Peak B. Sharon Seller, RN, Randa Spike, Technician Referring MD:             Louellen Molder, MD Medicines:                Lidocaine spray, Meperidine 50 mg IV, Midazolam 4                            mg IV Complications:            No immediate complications. Estimated Blood Loss:     Estimated blood loss was minimal. Procedure:                Pre-Anesthesia Assessment:                           - Prior to the procedure, a History and Physical                            was performed, and patient medications and                            allergies were reviewed. The patient's tolerance of                            previous anesthesia was also reviewed. The risks                            and benefits of the procedure and the sedation                            options and risks were discussed with the patient.                            All questions were answered, and informed consent                            was obtained. Prior Anticoagulants: The patient                            last took aspirin 2 days and previous NSAID                            medication 4 days prior to the procedure. ASA Grade  Assessment: II - A patient with mild systemic                            disease. After reviewing the risks and benefits,                            the patient was deemed in satisfactory condition to                            undergo the procedure.                           After obtaining informed consent, the endoscope was                            passed under direct  vision. Throughout the                            procedure, the patient's blood pressure, pulse, and                            oxygen saturations were monitored continuously. The                            EG-[id number]) scope was introduced through the                            mouth, and advanced to the second part of duodenum.                            The upper GI endoscopy was accomplished without                            difficulty. The patient tolerated the procedure                            well. Scope In: 2:50:05 PM Scope Out: 3:03:42 PM Total Procedure Duration: 0 hours 13 minutes 37 seconds  Findings:      The proximal esophagus and mid esophagus were normal.      LA Grade B (one or more mucosal breaks greater than 5 mm, not extending       between the tops of two mucosal folds) esophagitis was found 37 to 39 cm       from the incisors.      One moderate benign-appearing, intrinsic stenosis was found 39 cm from       the incisors. This measured 1.1 cm (inner diameter) x less than one cm       (in length) and was traversed. A TTS dilator was passed through the       scope. Dilation with a 15-16.5-18 mm balloon dilator was performed to 15       mm, 16.5 mm and 18 mm. The dilation site was examined and showed       moderate improvement in luminal narrowing and no perforation.      A 2 cm hiatal hernia was present.  Patchy inflammation characterized by congestion (edema), erosions,       erythema and granularity was found in the gastric antrum and in the       prepyloric region of the stomach. Biopsies were taken with a cold       forceps for histology.      The exam of the stomach was otherwise normal.      The duodenal bulb and second portion of the duodenum were normal. Impression:               - Normal proximal esophagus and mid esophagus.                           - LA Grade B esophagitis.                           - Benign-appearing esophageal stenosis.  Dilated.                           - 2 cm hiatal hernia.                           - Gastritis. Biopsied.                           - Normal duodenal bulb and second portion of the                            duodenum.                           Comment: No bleeding source identified on this exam. Moderate Sedation:      Moderate (conscious) sedation was administered by the endoscopy nurse       and supervised by the endoscopist. The following parameters were       monitored: oxygen saturation, heart rate, blood pressure, CO2       capnography and response to care. Total physician intraservice time was       18 minutes. Recommendation:           - Return patient to hospital ward for ongoing care.                           - Full liquid diet today.                           - Continue present medications.                           - Perform a colonoscopy tomorrow. Procedure Code(s):        --- Professional ---                           [phone], Esophagogastroduodenoscopy, flexible,                            transoral; with transendoscopic balloon dilation of  esophagus (less than 30 mm diameter)                           43239, Esophagogastroduodenoscopy, flexible,                            transoral; with biopsy, single or multiple                           99152, Moderate sedation services provided by the                            same physician or other qualified health care                            professional performing the diagnostic or                            therapeutic service that the sedation supports,                            requiring the presence of an independent trained                            observer to assist in the monitoring of the                            patient's level of consciousness and physiological                            status; initial 15 minutes of intraservice time,                            patient age 1 years or  older Diagnosis Code(s):        --- Professional ---                           K20.9, Esophagitis, unspecified                           K22.2, Esophageal obstruction                           K44.9, Diaphragmatic hernia without obstruction or                            gangrene                           K29.70, Gastritis, unspecified, without bleeding                           R13.14, Dysphagia, pharyngoesophageal phase                           K92.1, Melena (includes Hematochezia) CPT copyright 2016 American Medical Association. All rights reserved.  The codes documented in this report are preliminary and upon coder review may  be revised to meet current compliance requirements. Hildred Laser, MD Hildred Laser, MD 11/17/2017 3:22:24 PM This report has been signed electronically. Number of Addenda: 0

## 2017-11-18 ENCOUNTER — Encounter (HOSPITAL_COMMUNITY)
Admission: EM | Disposition: A | Discharge status: Home / Self Care | Payer: Self-pay | Source: Home / Self Care | Attending: Internal Medicine

## 2017-11-18 ENCOUNTER — Encounter (HOSPITAL_COMMUNITY): Payer: Self-pay | Admitting: *Deleted

## 2017-11-18 DIAGNOSIS — K625 Hemorrhage of anus and rectum: Secondary | ICD-10-CM

## 2017-11-18 DIAGNOSIS — K5731 Diverticulosis of large intestine without perforation or abscess with bleeding: Secondary | ICD-10-CM

## 2017-11-18 DIAGNOSIS — K648 Other hemorrhoids: Secondary | ICD-10-CM

## 2017-11-18 DIAGNOSIS — I10 Essential (primary) hypertension: Secondary | ICD-10-CM

## 2017-11-18 DIAGNOSIS — K5730 Diverticulosis of large intestine without perforation or abscess without bleeding: Secondary | ICD-10-CM

## 2017-11-18 DIAGNOSIS — Z9119 Patient's noncompliance with other medical treatment and regimen: Secondary | ICD-10-CM

## 2017-11-18 DIAGNOSIS — K922 Gastrointestinal hemorrhage, unspecified: Secondary | ICD-10-CM

## 2017-11-18 HISTORY — PX: COLONOSCOPY: SHX5424

## 2017-11-18 SURGERY — COLONOSCOPY
Anesthesia: Moderate Sedation

## 2017-11-18 MED ORDER — MIDAZOLAM HCL 5 MG/5ML IJ SOLN
INTRAMUSCULAR | Status: AC
  Filled 2017-11-18: qty 10

## 2017-11-18 MED ORDER — MEPERIDINE HCL 50 MG/ML IJ SOLN
INTRAMUSCULAR | Status: DC | PRN
  Administered 2017-11-18 (×2): 25 mg

## 2017-11-18 MED ORDER — ASPIRIN 81 MG PO TABS: 81.0000 mg | ORAL_TABLET | Freq: Every evening | ORAL | Status: DC

## 2017-11-18 MED ORDER — MEPERIDINE HCL 50 MG/ML IJ SOLN
INTRAMUSCULAR | Status: AC
  Filled 2017-11-18: qty 1

## 2017-11-18 MED ORDER — SODIUM CHLORIDE 0.9 % IV SOLN
INTRAVENOUS | Status: DC
  Administered 2017-11-18: 15:00:00 via INTRAVENOUS

## 2017-11-18 MED ORDER — PANTOPRAZOLE SODIUM 40 MG PO TBEC: 40.0000 mg | DELAYED_RELEASE_TABLET | Freq: Every day | ORAL | 1 refills | Status: DC

## 2017-11-18 MED ORDER — MIDAZOLAM HCL 5 MG/5ML IJ SOLN
INTRAMUSCULAR | Status: DC | PRN
  Administered 2017-11-18 (×2): 1 mg via INTRAVENOUS
  Administered 2017-11-18: 2 mg via INTRAVENOUS
  Administered 2017-11-18: 1 mg via INTRAVENOUS

## 2017-11-18 NOTE — Care Management Important Message (Signed)
Important Message  Patient Details  Name: Jeremy [name] MRN: [id number] Date of Birth: Jan 04, 1936   Medicare Important Message Given:  Yes    Sherald Barge, RN 11/18/2017, 12:37 PM

## 2017-11-18 NOTE — Progress Notes (Signed)
Patient discharged home with personal belongings. IV removed and sie intact. Patient discharged with prescriptions and AVS papers.

## 2017-11-18 NOTE — Progress Notes (Signed)
Brief colonoscopy note.  Poor prep. Complete exam to distal transverse colon. Multiple diverticula at sigmoid colon but none with stigmata of bleed. Internal hemorrhoids and anal papillae.  Suspect colonic diverticular bleed.  He has not bled over 48 hours. Stable for discharge later today. Resume aspirin in 24-48 hours. Advised not to use other OTC NSAIDs. Will bring patient back for elective colonoscopy in 4-8 weeks.

## 2017-11-18 NOTE — Discharge Summary (Addendum)
Physician Discharge Summary  Jeremy [name] YQM:[id number] DOB: 1935/10/24 DOA: 11/16/2017  PCP: Jani Gravel, MD  Admit date: 11/16/2017 Discharge date: 11/18/2017  Time spent: 35 minutes  Recommendations for Outpatient Follow-up:  1. Repeat CBC to follow hemoglobin trend 2. Repeat basic metabolic panel to follow electrolytes and renal function 3. Reassess blood pressure and if needed initiate antihypertensive regimen   Discharge Diagnoses:    Rectal bleed   GI bleed with chronic gastritis and positive H. Pylori    Hypertension   CAD (coronary artery disease)   Tobacco abuse   COPD (chronic obstructive pulmonary disease) (HCC)   Reflux esophagitis   Peptic stricture of esophagus   Hypokalemia   GI bleed due to NSAIDs   Diverticulosis of large intestine with hemorrhage   Discharge Condition: Stable and improved.  No signs of acute bleeding appreciated at time of discharge.  Patient will follow up with GI service in 4-6 weeks; has been instructed to follow-up with PCP in 2 weeks.  Diet recommendation: Heart healthy, low residue diet.  Filed Weights   11/16/17 0737 11/16/17 1032  Weight: 81.6 kg (180 lb) 86 kg (189 lb 9.5 oz)    Brief History of present illness:  82 y.o.male,with history of coronary artery disease stent in 1996, nuclear study in 2015 with EF of 45% without ischemia, history of benign anal rectal bleeding with colonoscopy in 2014, EGD in 2016 for dyspepsia with findings of ulcerative/erosive reflux esophagitis and dilatation of peptic stricture with positive H. pylori that was treated presented to the ED with painless rectal bleed of 4-5 episodes since the morning of admission. Patient reported combination of dark maroon blood, follow by fresh bright red blood episodes. Admitted for further evaluation and treatment.   Hospital Course:  1-Rectal bleed:  -he denies any further signs of bleeding stools -Status post endoscopy which demonstrated esophagitis, gastritis,  hiatal hernia and esophageal stricture but no active bleeding seen. -Dilatation provided and well tolerated  -Patient advised to stop the use of NSAIDs -Will continue the use of PPI at discharge -Follow up with GI in 4-6 weeks  2-essential hypertension -BP remains well controlled  -At home no using any antihypertensive regimen -Low-sodium diet encouraged.  3-tobacco abuse -Patient reports that he quit smoking; he was congratulated. -Currently chewing tobacco and cessation counseling was provided.  4-COPD -Stable and currently without any wheezing or complaints of shortness of breath -Continue home inhaler regimen.  5-history of coronary artery disease -No chest pain -Currently holding aspirin in the setting of acute GI bleed. -Continue the use of statins and resume ASA on 3/9 as per GI rec's -Patient to follow-up with cardiologist as an outpatient.  6-hypokalemia -Repleted -Follow basic metabolic at follow up visit to reassess electrolytes  7-esophageal stricture: With decreased p.o. intake and weight loss -Status post EGD and dilatation -had colonoscopy on 3/7: demonstrating extensive diverticulosis; but no active bleeding seen  -discharge on PPI -instructed to avoid the use of NSAID's  8-rhinitis  -advise to use tylenol/ tylenol sinus as needed for pain and congestion -also instructed to use claritin daily  9-chronic gastritis and positive H. Pylori -patient's biopsies positive for H. Pylori after he was discharged  -patient on BID PPI -GI contacted and would send appropriate antibiotic therapy for H. Pylori infection.  Procedures:  EGD (Positive LA grade B esophagitis, benign-appearing esophageal stenosis, hiatal hernia and gastritis).  Colonoscopy (with extensive diverticulosis)  Consultations:  GI  Discharge Exam: Vitals:   11/18/17 1545 11/18/17 1550  BP:    Pulse:    Resp:    Temp:    SpO2: 93% 98%     General: Afebrile, no chest pain, no  shortness of breath, no nausea, no vomiting.  Patient reports feeling good and denies seeing any further episode of blood in his stools. Tolerated EGD and colonoscopy w/o complications.  Cardiovascular: S1 and S2, no rubs, no gallops, no JVD  Respiratory: Good air movement bilaterally, normal respiratory effort, good oxygen saturation on room air.  Abdomen: Soft, nontender, nondistended, positive bowel sounds.  Musculoskeletal: No edema, no cyanosis, no clubbing.   Discharge Instructions   Discharge Instructions    Diet - low sodium heart healthy   Complete by:  As directed    Discharge instructions   Complete by:  As directed    Keep yourself well-hydrated Follow low residue diet Avoid the use of NSAIDs Take medications as prescribed Stop the use of tobacco For pain and cyanosis discomfort please use Tylenol. Okay to resume the use of aspirin on 11/20/2017. Follow-up with gastroenterology service as instructed Arrange follow-up with your PCP in 2 weeks.     Allergies as of 11/18/2017   No Known Allergies     Medication List    STOP taking these medications   ARTHRITIS STRENGTH BC POWDER PO   aspirin-acetaminophen-caffeine 250-250-65 MG tablet Commonly known as:  EXCEDRIN MIGRAINE   ibuprofen 200 MG tablet Commonly known as:  ADVIL,MOTRIN     TAKE these medications   albuterol 108 (90 Base) MCG/ACT inhaler Commonly known as:  PROVENTIL HFA;VENTOLIN HFA Inhale 1-2 puffs into the lungs every 6 (six) hours as needed for wheezing or shortness of breath.   aspirin 81 MG tablet Take 1 tablet (81 mg total) by mouth every evening. Start taking on:  11/20/2017 What changed:  These instructions start on 11/20/2017. If you are unsure what to do until then, ask your doctor or other care provider.   nitroGLYCERIN 0.4 MG SL tablet Commonly known as:  NITROSTAT PLACE 1 TABLET UNDER THE TONGUE EVERY 5 MINUTES FOR 3 DOSES AS NEEDED FOR CHEST PAIN   pantoprazole 40 MG  tablet Commonly known as:  PROTONIX Take 1 tablet (40 mg total) by mouth daily.   simvastatin 40 MG tablet Commonly known as:  ZOCOR TAKE 1 TABLET BY MOUTH EVERY EVENING   tiotropium 18 MCG inhalation capsule Commonly known as:  SPIRIVA HANDIHALER Place 1 capsule (18 mcg total) into inhaler and inhale daily. What changed:    when to take this  reasons to take this      No Known Allergies Follow-up Information    Jani Gravel, MD. Schedule an appointment as soon as possible for a visit in 2 week(s).   Specialty:  Internal Medicine Contact information: [address] [place] [address] [phone]           The results of significant diagnostics from this hospitalization (including imaging, microbiology, ancillary and laboratory) are listed below for reference.    Significant Diagnostic Studies: Dg Abd Acute W/chest  Result Date: 11/16/2017 CLINICAL DATA:  Gastrointestinal bleeding beginning this morning. EXAM: DG ABDOMEN ACUTE W/ 1V CHEST COMPARISON:  CT abdomen and pelvis 08/15/2016. PA and lateral chest 05/02/2016. FINDINGS: Single-view of the chest demonstrates clear lungs and normal heart size. No pneumothorax or pleural effusion. Two views of the abdomen show no free intraperitoneal air. The bowel gas pattern is nonobstructive. Large volume of stool ascending colon is seen. Cholecystectomy clips are  identified. Multilevel thoracic and lumbar spondylosis is noted. IMPRESSION: No acute finding chest or abdomen. Large stool burden ascending colon. Electronically Signed   By: Inge Rise M.D.   On: 11/16/2017 08:28   Labs: Basic Metabolic Panel: Recent Labs  Lab 11/16/17 0745 11/17/17 0401  NA 140 141  K 3.1* 3.7  CL 104 105  CO2 25 27  GLUCOSE 121* 85  BUN 10 10  CREATININE 1.02 1.11  CALCIUM 8.8* 8.5*   Liver Function Tests: Recent Labs  Lab 11/16/17 0745  AST 20  ALT 13*  ALKPHOS 86  BILITOT 0.6  PROT 6.6  ALBUMIN 3.8    CBC: Recent Labs  Lab 11/16/17 0744 11/16/17 1208 11/16/17 2002 11/17/17 0401  WBC 4.7  --   --   --   NEUTROABS 3.4  --   --   --   HGB 15.0 14.4 14.6 14.2  HCT 45.2 43.8 43.8 43.6  MCV 89.0  --   --   --   PLT 169  --   --   --    Cardiac Enzymes: Recent Labs  Lab 11/16/17 0745  TROPONINI <0.03    Signed:  Barton Dubois MD.  [facility] 11/18/2017, 4:45 PM

## 2017-11-18 NOTE — Op Note (Signed)
[facility] Patient Name: Jeremy [name] Procedure Date: 11/18/2017 2:56 PM MRN: [id number] Date of Birth: October 24, 1935 Attending MD: Hildred Laser , MD CSN: [id number] Age: 82 Admit Type: Inpatient Procedure:                Colonoscopy Indications:              Melena, Rectal bleeding Providers:                Hildred Laser, MD, Janeece Riggers, RN, Nelma Rothman,                            Technician Referring MD:             Louellen Molder, MD Medicines:                Meperidine 50 mg IV, Midazolam 5 mg IV Complications:            No immediate complications. Estimated Blood Loss:     Estimated blood loss: none. Estimated blood loss:                            none. Procedure:                Pre-Anesthesia Assessment:                           - Prior to the procedure, a History and Physical                            was performed, and patient medications and                            allergies were reviewed. The patient's tolerance of                            previous anesthesia was also reviewed. The risks                            and benefits of the procedure and the sedation                            options and risks were discussed with the patient.                            All questions were answered, and informed consent                            was obtained. Prior Anticoagulants: The patient                            last took aspirin 3 days prior to the procedure.                            ASA Grade Assessment: II - A patient with mild  systemic disease. After reviewing the risks and                            benefits, the patient was deemed in satisfactory                            condition to undergo the procedure.                           After obtaining informed consent, the colonoscope                            was passed under direct vision. Throughout the                            procedure, the patient's blood pressure,  pulse, and                            oxygen saturations were monitored continuously. The                            [id number] ([id number]) scope was introduced through                            the anus with the intention of advancing to the                            cecum. The scope was advanced to the transverse                            colon before the procedure was aborted. Medications                            were given. The colonoscopy was technically                            difficult and complex due to poor bowel prep and                            restricted mobility of the colon. The patient                            tolerated the procedure well. The quality of the                            bowel preparation was poor. The rectum was                            photographed. Scope In: 3:24:03 PM Scope Out: 3:53:17 PM Total Procedure Duration: 0 hours 29 minutes 14 seconds  Findings:      The perianal and digital rectal examinations were normal.      Many small and large-mouthed diverticula were found in the sigmoid colon       and  descending colon.      Internal hemorrhoids were found during retroflexion. The hemorrhoids       were small.      Anal papilla(e) were hypertrophied. Impression:               - Preparation of the colon was poor resulting in                            incomplete exam.                           - Diverticulosis in the sigmoid colon and in the                            descending colon.                           - Internal hemorrhoids.                           - No specimens collected.                           Comment: Suspect colonic diverticular bleed. Moderate Sedation:      Moderate (conscious) sedation was administered by the endoscopy nurse       and supervised by the endoscopist. The following parameters were       monitored: oxygen saturation, heart rate, blood pressure, CO2       capnography and response to care. Total physician  intraservice time was       35 minutes. Recommendation:           - Return patient to hospital ward for possible                            discharge same day.                           - Resume previous diet today.                           - Continue present medications.                           - Repeat colonoscopy at appointment to be scheduled                            in few weeks. Procedure Code(s):        --- Professional ---                           [phone], 53, Colonoscopy, flexible; diagnostic,                            including collection of specimen(s) by brushing or                            washing, when performed (separate procedure)  38101, Moderate sedation services provided by the                            same physician or other qualified health care                            professional performing the diagnostic or                            therapeutic service that the sedation supports,                            requiring the presence of an independent trained                            observer to assist in the monitoring of the                            patient's level of consciousness and physiological                            status; initial 15 minutes of intraservice time,                            patient age 58 years or older                           [phone], Moderate sedation services; each additional                            15 minutes intraservice time Diagnosis Code(s):        --- Professional ---                           K64.8, Other hemorrhoids                           K92.1, Melena (includes Hematochezia)                           K62.5, Hemorrhage of anus and rectum                           K57.30, Diverticulosis of large intestine without                            perforation or abscess without bleeding CPT copyright 2016 American Medical Association. All rights reserved. The codes documented in this report  are preliminary and upon coder review may  be revised to meet current compliance requirements. Hildred Laser, MD Hildred Laser, MD 11/18/2017 4:06:28 PM This report has been signed electronically. Number of Addenda: 0

## 2017-11-20 ENCOUNTER — Other Ambulatory Visit (INDEPENDENT_AMBULATORY_CARE_PROVIDER_SITE_OTHER): Payer: Self-pay | Admitting: Internal Medicine

## 2017-11-20 MED ORDER — BIS SUBCIT-METRONID-TETRACYC 140-125-125 MG PO CAPS: 3.0000 | ORAL_CAPSULE | Freq: Three times a day (TID) | ORAL | 0 refills | Status: DC

## 2017-11-23 ENCOUNTER — Encounter (HOSPITAL_COMMUNITY): Payer: Self-pay | Admitting: Internal Medicine

## 2017-11-26 ENCOUNTER — Other Ambulatory Visit: Payer: Self-pay | Admitting: Cardiology

## 2017-12-24 ENCOUNTER — Other Ambulatory Visit: Payer: Self-pay | Admitting: Cardiology

## 2018-02-22 ENCOUNTER — Encounter (INDEPENDENT_AMBULATORY_CARE_PROVIDER_SITE_OTHER): Payer: Self-pay | Admitting: *Deleted

## 2018-02-23 ENCOUNTER — Ambulatory Visit (INDEPENDENT_AMBULATORY_CARE_PROVIDER_SITE_OTHER): Payer: Medicare Other | Admitting: Cardiology

## 2018-02-23 ENCOUNTER — Other Ambulatory Visit: Payer: Self-pay

## 2018-02-23 ENCOUNTER — Encounter: Payer: Self-pay | Admitting: Cardiology

## 2018-02-23 VITALS — BP 150/79 | HR 60 | Ht 72.0 in | Wt 179.0 lb

## 2018-02-23 DIAGNOSIS — I10 Essential (primary) hypertension: Secondary | ICD-10-CM

## 2018-02-23 DIAGNOSIS — I2510 Atherosclerotic heart disease of native coronary artery without angina pectoris: Secondary | ICD-10-CM | POA: Diagnosis not present

## 2018-02-23 DIAGNOSIS — E785 Hyperlipidemia, unspecified: Secondary | ICD-10-CM

## 2018-02-23 MED ORDER — LISINOPRIL 5 MG PO TABS: 5.0000 mg | ORAL_TABLET | Freq: Every day | ORAL | 6 refills | Status: DC

## 2018-02-23 NOTE — Progress Notes (Signed)
Clinical Summary Jeremy [name] is a 82 y.o.male seen for the following medical problems.   1. CAD - history of prior stenting to LAD - nuclear stress 03/2014 with no ischemia. LVEF 47%.  - admit 04/2016 with chest pain, negative EKG and enzymes. Symptoms better with belching, thoughtGI etiology. He had been off his protonix at the time. Symptoms improved after restarting.    - aspirin held 11/2017 due to GI bleed  - no recent chest pain. No SOB or DOE - compliant with meds.    2. COPD - PFTs 10/2015 with moderate COPD - denies recent symptoms.    3. Hyperlipidemia - Has been on zocor for several years - compliant with statin   4. GI bleed - admit 11/2017 with rectal bleeding. EGD showed esophagitis, gastritis but no active bleeding. - advised to stop NSAIDs, started on PPI  5. HTN - orthostatic symptoms at times.     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    Past Medical History:  Diagnosis Date  . Abnormal CXR 4/20011   Chest  CT mode/serere COPD ( no mediastinal abnormality)  . CAD (coronary artery disease) 2005   anterior MI with stent to the LAD in 1991 / stent 1996 / nuclear 2005, no ischemia  . Carotid bruit    Doppler, June, 2013, 0-39% bilateral  . COPD (chronic obstructive pulmonary disease) ([place])   . Diverticula of colon   . Ejection fraction    55-60%, echo, April, 2011, could not estimate RV pressure  . Esophagitis   . HOH (hard of hearing)   . Hyperlipidemia   . Hypertension   . Myocardial infarction ([place])   . Shortness of breath    with exertion  . Sleep apnea    Stop Bang score of 5  . Tobacco abuse      No Known Allergies   Current Outpatient Medications  Medication Sig Dispense Refill  . albuterol (PROVENTIL HFA;VENTOLIN HFA) 108 (90 Base) MCG/ACT inhaler Inhale 1-2 puffs into the lungs every 6 (six) hours as needed for wheezing or shortness of breath.    Marland Kitchen aspirin 81 MG tablet Take 1 tablet (81 mg  total) by mouth every evening.    . nitroGLYCERIN (NITROSTAT) 0.4 MG SL tablet PLACE 1 TABLET UNDER THE TONGUE EVERY 5 MINUTES FOR 3 DOSES AS NEEDED FOR CHEST PAIN 75 tablet 3  . pantoprazole (PROTONIX) 40 MG tablet Take 1 tablet (40 mg total) by mouth daily. 30 tablet 1  . simvastatin (ZOCOR) 40 MG tablet TAKE 1 TABLET BY MOUTH EVERY EVENING 30 tablet 3  . tiotropium (SPIRIVA HANDIHALER) 18 MCG inhalation capsule Place 1 capsule (18 mcg total) into inhaler and inhale daily. (Patient taking differently: Place 18 mcg into inhaler and inhale daily as needed (for shortness of breath). ) 30 capsule 12   No current facility-administered medications for this visit.      Past Surgical History:  Procedure Laterality Date  . APPENDECTOMY    . BIOPSY  11/17/2017   Procedure: BIOPSY;  Surgeon: Rogene Houston, MD;  [facility];  Service: Endoscopy;;  gastric  . CARDIAC CATHETERIZATION    . CATARACT EXTRACTION Left   . CATARACT EXTRACTION W/PHACO Right 01/22/2014   Procedure: CATARACT EXTRACTION PHACO AND INTRAOCULAR LENS PLACEMENT (IOC);  Surgeon: Tonny Madolyn Ackroyd, MD;  Location: [facility];  Service: Ophthalmology;  Laterality: Right;  CDE 7.46  . CHOLECYSTECTOMY  8 years ago [facility]  . COLONOSCOPY  10/05/2012   Dr. Laural Golden: multiple diverticula at sigmoid colon, multiple polyps (tubular adenomas), small external hemorrhoids  . COLONOSCOPY N/A 11/18/2017   Procedure: COLONOSCOPY;  Surgeon: Rogene Houston, MD;  [facility];  Service: Endoscopy;  Laterality: N/A;  . CYST REMOVAL HAND     right wrist  . ESOPHAGEAL DILATION N/A 05/23/2015   Procedure: ESOPHAGEAL DILATION;  Surgeon: Daneil Dolin, MD;  [facility];  Service: Endoscopy;  Laterality: N/A;  . ESOPHAGEAL DILATION N/A 11/17/2017   Procedure: ESOPHAGEAL DILATION;  Surgeon: Rogene Houston, MD;  [facility];  Service: Endoscopy;  Laterality: N/A;  . ESOPHAGOGASTRODUODENOSCOPY N/A 05/23/2015   Dr. Gala Romney:  Ulcerative/ erosive reflux esophagitis with peptic stricture formation. Status post dilation as described. Hiatal hernia. Abnormal gastric mucosa of uncertain significance status post gastric biospy. +H.pylori  . ESOPHAGOGASTRODUODENOSCOPY N/A 11/17/2017   Procedure: ESOPHAGOGASTRODUODENOSCOPY (EGD);  Surgeon: Rogene Houston, MD;  [facility];  Service: Endoscopy;  Laterality: N/A;  . PERCUTANEOUS STENT INTERVENTION    . SKIN CANCER EXCISION  2015   ear & neck  . TONSILLECTOMY       No Known Allergies    Family History  Problem Relation Age of Onset  . Heart disease Mother   . Heart disease Father   . Heart disease Brother   . Stomach cancer Brother   . Colon cancer Neg Hx      Social History Jeremy [name] reports that he quit smoking about 23 years ago. His smoking use included cigarettes. He started smoking about 34 years ago. He has a 36.00 pack-year smoking history. His smokeless tobacco use includes chew. Jeremy [name] reports that he does not drink alcohol.   Review of Systems CONSTITUTIONAL: No weight loss, fever, chills, weakness or fatigue.  HEENT: Eyes: No visual loss, blurred vision, double vision or yellow sclerae.No hearing loss, sneezing, congestion, runny nose or sore throat.  SKIN: No rash or itching.  CARDIOVASCULAR: per hpi RESPIRATORY: No shortness of breath, cough or sputum.  GASTROINTESTINAL: No anorexia, nausea, vomiting or diarrhea. No abdominal pain or blood.  GENITOURINARY: No burning on urination, no polyuria NEUROLOGICAL: No headache, dizziness, syncope, paralysis, ataxia, numbness or tingling in the extremities. No change in bowel or bladder control.  MUSCULOSKELETAL: No muscle, back pain, joint pain or stiffness.  LYMPHATICS: No enlarged nodes. No history of splenectomy.  PSYCHIATRIC: No history of depression or anxiety.  ENDOCRINOLOGIC: No reports of sweating, cold or heat intolerance. No polyuria or polydipsia.  Marland Kitchen   Physical  Examination Vitals:   02/23/18 0805  BP: (!) 150/79  Pulse: 60  SpO2: 96%   Filed Weights   02/23/18 0805  Weight: 179 lb (81.2 kg)    Gen: resting comfortably, no acute distress HEENT: no scleral icterus, pupils equal round and reactive, no palptable cervical adenopathy,  CV: RRR, no m/r/g, no jvd Resp: Clear to auscultation bilaterally GI: abdomen is soft, non-tender, non-distended, normal bowel sounds, no hepatosplenomegaly MSK: extremities are warm, no edema.  Skin: warm, no rash Neuro:  no focal deficits Psych: appropriate affect   Diagnostic Studies  08/2016 [facility] [place] No aneurysm    Assessment and Plan   1. CAD - no recent symptoms, continue current meds  2. Hyperlipidemia - repeat lipid panel, continue statin  3. HTN - above goal, start lisinopril 5mg  daily. Check BMET in 2 weeks.    F/u 1year  Alphonse Guild.  Harl Bowie, M.D.

## 2018-02-23 NOTE — Patient Instructions (Addendum)
Medication Instructions:   Begin Lisinopril 5mg  daily.  Continue all other current medications.  Labwork:  BMET, FLP - orders given today.  Reminder:  Nothing to eat or drink after 12 midnight prior to labs.  Do in 2 weeks, around 03/09/2018.  Office will contact with results via phone or letter.    Testing/Procedures: none  Follow-Up: Your physician wants you to follow up in:  1 year.  You will receive a reminder letter in the mail one-two months in advance.  If you don't receive a letter, please call our office to schedule the follow up appointment   Any Other Special Instructions Will Be Listed Below (If Applicable).  If you need a refill on your cardiac medications before your next appointment, please call your pharmacy.

## 2018-03-01 ENCOUNTER — Encounter: Payer: Self-pay | Admitting: Cardiology

## 2018-03-25 DIAGNOSIS — I10 Essential (primary) hypertension: Secondary | ICD-10-CM | POA: Diagnosis not present

## 2018-03-25 DIAGNOSIS — E785 Hyperlipidemia, unspecified: Secondary | ICD-10-CM | POA: Diagnosis not present

## 2018-03-25 LAB — BASIC METABOLIC PANEL
BUN / CREAT RATIO: 16 (calc) (ref 6–22)
BUN: 18 mg/dL (ref 7–25)
CO2: 30 mmol/L (ref 20–32)
CREATININE: 1.16 mg/dL — AB (ref 0.70–1.11)
Calcium: 8.8 mg/dL (ref 8.6–10.3)
Chloride: 106 mmol/L (ref 98–110)
GLUCOSE: 91 mg/dL (ref 65–99)
Potassium: 4.3 mmol/L (ref 3.5–5.3)
SODIUM: 141 mmol/L (ref 135–146)

## 2018-03-25 LAB — LIPID PANEL
Cholesterol: 129 mg/dL (ref <200)
HDL: 38 mg/dL — AB (ref >40)
LDL Cholesterol (Calc): 75 mg/dL (calc)
NON-HDL CHOLESTEROL (CALC): 91 mg/dL (ref <130)
TRIGLYCERIDES: 76 mg/dL (ref <150)
Total CHOL/HDL Ratio: 3.4 (calc) (ref <5.0)

## 2018-04-05 ENCOUNTER — Telehealth: Payer: Self-pay | Admitting: Cardiology

## 2018-04-05 NOTE — Telephone Encounter (Signed)
Patient returning call.

## 2018-04-05 NOTE — Telephone Encounter (Signed)
Attempted to return call. Unable to LM

## 2018-04-07 NOTE — Telephone Encounter (Signed)
Results given via [place] office

## 2018-04-19 ENCOUNTER — Other Ambulatory Visit: Payer: Self-pay | Admitting: Cardiology

## 2018-06-07 ENCOUNTER — Other Ambulatory Visit: Payer: Self-pay

## 2018-06-07 MED ORDER — PANTOPRAZOLE SODIUM 40 MG PO TBEC: 40.0000 mg | DELAYED_RELEASE_TABLET | Freq: Every day | ORAL | 1 refills | Status: DC

## 2018-07-05 DIAGNOSIS — Z23 Encounter for immunization: Secondary | ICD-10-CM | POA: Diagnosis not present

## 2018-08-02 ENCOUNTER — Other Ambulatory Visit: Payer: Self-pay

## 2018-09-21 ENCOUNTER — Other Ambulatory Visit (INDEPENDENT_AMBULATORY_CARE_PROVIDER_SITE_OTHER): Payer: Self-pay | Admitting: *Deleted

## 2018-09-21 DIAGNOSIS — Z1211 Encounter for screening for malignant neoplasm of colon: Secondary | ICD-10-CM

## 2018-10-05 ENCOUNTER — Encounter (INDEPENDENT_AMBULATORY_CARE_PROVIDER_SITE_OTHER): Payer: Self-pay | Admitting: Internal Medicine

## 2018-10-05 ENCOUNTER — Ambulatory Visit (INDEPENDENT_AMBULATORY_CARE_PROVIDER_SITE_OTHER): Payer: Medicare Other | Admitting: Internal Medicine

## 2018-10-05 VITALS — BP 138/85 | HR 73 | Temp 98.8°F | Resp 18 | Ht 72.0 in | Wt 177.9 lb

## 2018-10-05 DIAGNOSIS — K219 Gastro-esophageal reflux disease without esophagitis: Secondary | ICD-10-CM

## 2018-10-05 NOTE — Patient Instructions (Signed)
Continue the Protonix. PR in 2 weeks.

## 2018-10-05 NOTE — Progress Notes (Signed)
Subjective:    Patient ID: Jeremy [name], male    DOB: 02/07/1936, 83 y.o.   MRN: [id number] PCP Dr. Maudie Mercury. HPI Presents today with c/o abdominal pain. For the last 2-3 weeks, her stomach has been bothering him. Today he is pain free. He went to [employer] about 2-3 weeks ago and ate a hot dog and was nauseated x 2 days.  He has a lot of gas since then his. He stopped the Protonix for a week or so. He appetite is okay.  Underwent an EGD 11/17/2017 for melena, hematochezia. Impression:               - Normal proximal esophagus and mid esophagus.                           - LA Grade B esophagitis.                           - Benign-appearing esophageal stenosis. Dilated.                           - 2 cm hiatal hernia.                           - Gastritis. Biopsied.                           - Normal duodenal bulb and second portion of the                            duodenum.                           Comment: No bleeding source identified on this exam. Treated for H. pylera with Pylera.   11/18/2017 Colonoscopy: Melena, rectal bleeding.  Impression:               - Preparation of the colon was poor resulting in                            incomplete exam.                           - Diverticulosis in the sigmoid colon and in the                            descending colon.                           - Internal hemorrhoids.                           - No specimens collected.                           Comment: Suspect colonic diverticular bleed.   Review of Systems Past Medical History:  Diagnosis Date  . Abnormal CXR 4/20011   Chest  CT mode/serere COPD ( no mediastinal abnormality)  . CAD (coronary artery disease) 2005   anterior MI with  stent to the LAD in 1991 / stent 1996 / nuclear 2005, no ischemia  . Carotid bruit    Doppler, June, 2013, 0-39% bilateral  . COPD (chronic obstructive pulmonary disease) ([place])   . Diverticula of colon   . Ejection fraction    55-60%, echo, April,  2011, could not estimate RV pressure  . Esophagitis   . HOH (hard of hearing)   . Hyperlipidemia   . Hypertension   . Myocardial infarction ([place])   . Shortness of breath    with exertion  . Sleep apnea    Stop Bang score of 5  . Tobacco abuse     Past Surgical History:  Procedure Laterality Date  . APPENDECTOMY    . BIOPSY  11/17/2017   Procedure: BIOPSY;  Surgeon: Rogene Houston, MD;  [facility];  Service: Endoscopy;;  gastric  . CARDIAC CATHETERIZATION    . CATARACT EXTRACTION Left   . CATARACT EXTRACTION W/PHACO Right 01/22/2014   Procedure: CATARACT EXTRACTION PHACO AND INTRAOCULAR LENS PLACEMENT (IOC);  Surgeon: Tonny Branch, MD;  Location: [facility];  Service: Ophthalmology;  Laterality: Right;  CDE 7.46  . CHOLECYSTECTOMY     8 years ago [facility]  . COLONOSCOPY  10/05/2012   Dr. Laural Golden: multiple diverticula at sigmoid colon, multiple polyps (tubular adenomas), small external hemorrhoids  . COLONOSCOPY N/A 11/18/2017   Procedure: COLONOSCOPY;  Surgeon: Rogene Houston, MD;  [facility];  Service: Endoscopy;  Laterality: N/A;  . CYST REMOVAL HAND     right wrist  . ESOPHAGEAL DILATION N/A 05/23/2015   Procedure: ESOPHAGEAL DILATION;  Surgeon: Daneil Dolin, MD;  [facility];  Service: Endoscopy;  Laterality: N/A;  . ESOPHAGEAL DILATION N/A 11/17/2017   Procedure: ESOPHAGEAL DILATION;  Surgeon: Rogene Houston, MD;  [facility];  Service: Endoscopy;  Laterality: N/A;  . ESOPHAGOGASTRODUODENOSCOPY N/A 05/23/2015   Dr. Gala Romney: Ulcerative/ erosive reflux esophagitis with peptic stricture formation. Status post dilation as described. Hiatal hernia. Abnormal gastric mucosa of uncertain significance status post gastric biospy. +H.pylori  . ESOPHAGOGASTRODUODENOSCOPY N/A 11/17/2017   Procedure: ESOPHAGOGASTRODUODENOSCOPY (EGD);  Surgeon: Rogene Houston, MD;  [facility];  Service: Endoscopy;  Laterality: N/A;  . PERCUTANEOUS STENT  INTERVENTION    . SKIN CANCER EXCISION  2015   ear & neck  . TONSILLECTOMY      No Known Allergies  Current Outpatient Medications on File Prior to Visit  Medication Sig Dispense Refill  . albuterol (PROVENTIL HFA;VENTOLIN HFA) 108 (90 Base) MCG/ACT inhaler Inhale 1-2 puffs into the lungs every 6 (six) hours as needed for wheezing or shortness of breath.    Marland Kitchen aspirin 81 MG tablet Take 1 tablet (81 mg total) by mouth every evening.    Marland Kitchen lisinopril (PRINIVIL,ZESTRIL) 5 MG tablet Take 5 mg by mouth daily.    . nitroGLYCERIN (NITROSTAT) 0.4 MG SL tablet PLACE 1 TABLET UNDER THE TONGUE EVERY 5 MINUTES FOR 3 DOSES AS NEEDED FOR CHEST PAIN 75 tablet 3  . pantoprazole (PROTONIX) 40 MG tablet Take 1 tablet (40 mg total) by mouth daily. 90 tablet 1  . simvastatin (ZOCOR) 40 MG tablet TAKE 1 TABLET BY MOUTH EVERY EVENING 90 tablet 1  . tiotropium (SPIRIVA HANDIHALER) 18 MCG inhalation capsule Place 1 capsule (18 mcg total) into inhaler and inhale daily. (Patient taking differently: Place 18 mcg into inhaler and inhale daily as needed (for shortness of breath). ) 30 capsule 12  .  lisinopril (PRINIVIL,ZESTRIL) 5 MG tablet Take 1 tablet (5 mg total) by mouth daily. 30 tablet 6   No current facility-administered medications on file prior to visit.         Objective:   Physical Exam Blood pressure 138/85, pulse 73, temperature 98.8 F (37.1 C), temperature source Oral, resp. rate 18, height 6' (1.829 m), weight 177 lb 14.4 oz (80.7 kg). Alert and oriented. Skin warm and dry. Oral mucosa is moist.   . Sclera anicteric, conjunctivae is pink. Thyroid not enlarged. No cervical lymphadenopathy. Lungs clear. Heart regular rate and rhythm.  Abdomen is soft. Bowel sounds are positive. No hepatomegaly. No abdominal masses felt. No tenderness.  No edema to lower extremities.          Assessment & Plan:  GERD. He will start the Protonix back.  He will let me know how he is doing in about 2 weeks.

## 2018-10-17 ENCOUNTER — Telehealth (INDEPENDENT_AMBULATORY_CARE_PROVIDER_SITE_OTHER): Payer: Self-pay | Admitting: *Deleted

## 2018-10-17 ENCOUNTER — Other Ambulatory Visit: Payer: Self-pay | Admitting: Cardiology

## 2018-10-17 ENCOUNTER — Encounter (INDEPENDENT_AMBULATORY_CARE_PROVIDER_SITE_OTHER): Payer: Self-pay | Admitting: *Deleted

## 2018-10-17 NOTE — Telephone Encounter (Signed)
Patient needs trilyte 

## 2018-10-20 MED ORDER — PEG 3350-KCL-NA BICARB-NACL 420 G PO SOLR: 4000.0000 mL | Freq: Once | ORAL | 0 refills | Status: AC

## 2018-11-03 DIAGNOSIS — J309 Allergic rhinitis, unspecified: Secondary | ICD-10-CM | POA: Diagnosis not present

## 2018-11-03 DIAGNOSIS — I10 Essential (primary) hypertension: Secondary | ICD-10-CM | POA: Diagnosis not present

## 2018-11-03 DIAGNOSIS — I2510 Atherosclerotic heart disease of native coronary artery without angina pectoris: Secondary | ICD-10-CM | POA: Diagnosis not present

## 2018-11-03 DIAGNOSIS — E785 Hyperlipidemia, unspecified: Secondary | ICD-10-CM | POA: Diagnosis not present

## 2018-11-03 DIAGNOSIS — R05 Cough: Secondary | ICD-10-CM | POA: Diagnosis not present

## 2018-11-10 ENCOUNTER — Telehealth (INDEPENDENT_AMBULATORY_CARE_PROVIDER_SITE_OTHER): Payer: Self-pay | Admitting: *Deleted

## 2018-11-10 NOTE — Telephone Encounter (Signed)
Referring MD/PCP: kim   Procedure: tcs  Reason/Indication:  screening  Has patient had this procedure before?  Yes, 2019 -- poor prep  If so, when, by whom and where?    Is there a family history of colon cancer?  no  Who?  What age when diagnosed?    Is patient diabetic?   no      Does patient have prosthetic heart valve or mechanical valve?  no  Do you have a pacemaker?  no  Has patient ever had endocarditis? no  Has patient had joint replacement within last 12 months?  no  Is patient constipated or do they take laxatives? no  Does patient have a history of alcohol/drug use?  no  Is patient on blood thinner such as Coumadin, Plavix and/or Aspirin? yes  Medications: asa 81 mg daily, lisinopril 5 mg daily, pantoprazole 40 mg daily, simvastatin 40 mg daily, tylenol 500 mg 1-2 tab, benadryl 1/2 tab nightly  Allergies: nkda  Medication Adjustment per Dr Lindi Adie, NP: asa 2 days  Procedure date & time: 12/08/18 at 930

## 2018-11-14 NOTE — Telephone Encounter (Signed)
agree

## 2018-12-07 DIAGNOSIS — J441 Chronic obstructive pulmonary disease with (acute) exacerbation: Secondary | ICD-10-CM | POA: Diagnosis not present

## 2018-12-08 ENCOUNTER — Other Ambulatory Visit: Payer: Self-pay | Admitting: Cardiology

## 2019-01-13 ENCOUNTER — Other Ambulatory Visit: Payer: Self-pay | Admitting: Cardiology

## 2019-02-13 ENCOUNTER — Encounter (INDEPENDENT_AMBULATORY_CARE_PROVIDER_SITE_OTHER): Payer: Self-pay | Admitting: *Deleted

## 2019-02-24 ENCOUNTER — Encounter: Payer: Self-pay | Admitting: Cardiology

## 2019-02-24 ENCOUNTER — Telehealth (INDEPENDENT_AMBULATORY_CARE_PROVIDER_SITE_OTHER): Payer: Medicare Other | Admitting: Cardiology

## 2019-02-24 VITALS — Ht 72.0 in | Wt 179.0 lb

## 2019-02-24 DIAGNOSIS — I10 Essential (primary) hypertension: Secondary | ICD-10-CM

## 2019-02-24 DIAGNOSIS — I2510 Atherosclerotic heart disease of native coronary artery without angina pectoris: Secondary | ICD-10-CM

## 2019-02-24 DIAGNOSIS — E785 Hyperlipidemia, unspecified: Secondary | ICD-10-CM

## 2019-02-24 NOTE — Progress Notes (Signed)
Virtual Visit via Telephone Note   This visit type was conducted due to national recommendations for restrictions regarding the COVID-19 Pandemic (e.g. social distancing) in an effort to limit this patient's exposure and mitigate transmission in our community.  Due to his co-morbid illnesses, this patient is at least at moderate risk for complications without adequate follow up.  This format is felt to be most appropriate for this patient at this time.  The patient did not have access to video technology/had technical difficulties with video requiring transitioning to audio format only (telephone).  All issues noted in this document were discussed and addressed.  No physical exam could be performed with this format.  Please refer to the patient's chart for his  consent to telehealth for [facility].   Date:  02/24/2019   ID:  Jeremy [name], DOB [birth year], MRN [id number]  Patient Location: Home Provider Location: Office  PCP:  Jani Gravel, MD  Cardiologist:  Dr Carlyle Dolly MD Electrophysiologist:  None   Evaluation Performed:  Follow-Up Visit  Chief Complaint:  1 year follow up  History of Present Illness:    Jeremy [name] is a 83 y.o. male seen for the following medical problems.   1. CAD - history of prior stenting to LAD - nuclear stress 03/2014 with no ischemia. LVEF 47%.  - admit 04/2016 with chest pain, negative EKG and enzymes. Symptoms better with belching, thoughtGI etiology. He had been off his protonix at the time. Symptoms improved after restarting.    - no recent chest pain. Some SOB mainly with humidity - compliant with meds. He is back on ASA 81mg  daily, prior GI bleed early last year   2. COPD - PFTs 10/2015 with moderate COPD   3. Hyperlipidemia - Has been on zocor for several years 03/2018 TC 129 HDL TG 76 LDL 75 - remains compliant with statin    4. History of GI bleed - admit 11/2017 with rectal bleeding. EGD showed esophagitis,  gastritis but no active bleeding.   5. 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    The patient does not have symptoms concerning for COVID-19 infection (fever, chills, cough, or new shortness of breath).    Past Medical History:  Diagnosis Date  . Abnormal CXR 4/20011   Chest  CT mode/serere COPD ( no mediastinal abnormality)  . CAD (coronary artery disease) 2005   anterior MI with stent to the LAD in 1991 / stent 1996 / nuclear 2005, no ischemia  . Carotid bruit    Doppler, June, 2013, 0-39% bilateral  . COPD (chronic obstructive pulmonary disease) ([place])   . Diverticula of colon   . Ejection fraction    55-60%, echo, April, 2011, could not estimate RV pressure  . Esophagitis   . HOH (hard of hearing)   . Hyperlipidemia   . Hypertension   . Myocardial infarction ([place])   . Shortness of breath    with exertion  . Sleep apnea    Stop Bang score of 5  . Tobacco abuse    Past Surgical History:  Procedure Laterality Date  . APPENDECTOMY    . BIOPSY  11/17/2017   Procedure: BIOPSY;  Surgeon: Rogene Houston, MD;  Location[facility];  Service: Endoscopy;;  gastric  . CARDIAC CATHETERIZATION    . CATARACT EXTRACTION Left   . CATARACT EXTRACTION W/PHACO Right 01/22/2014   Procedure: CATARACT  EXTRACTION PHACO AND INTRAOCULAR LENS PLACEMENT (IOC);  Surgeon: Tonny Emika Tiano, MD;  Location: [facility] ORS;  Service: Ophthalmology;  Laterality: Right;  CDE 7.46  . CHOLECYSTECTOMY     8 years ago [facility]  . COLONOSCOPY  10/05/2012   Dr. Laural Golden: multiple diverticula at sigmoid colon, multiple polyps (tubular adenomas), small external hemorrhoids  . COLONOSCOPY N/A 11/18/2017   Procedure: COLONOSCOPY;  Surgeon: Rogene Houston, MD;  Location[facility];  Service: Endoscopy;  Laterality: N/A;  . CYST REMOVAL HAND     right wrist  . ESOPHAGEAL DILATION N/A 05/23/2015   Procedure: ESOPHAGEAL DILATION;  Surgeon: Daneil Dolin, MD;  [facility]  [facility];  Service: Endoscopy;  Laterality: N/A;  . ESOPHAGEAL DILATION N/A 11/17/2017   Procedure: ESOPHAGEAL DILATION;  Surgeon: Rogene Houston, MD;  [facility];  Service: Endoscopy;  Laterality: N/A;  . ESOPHAGOGASTRODUODENOSCOPY N/A 05/23/2015   Dr. Gala Romney: Ulcerative/ erosive reflux esophagitis with peptic stricture formation. Status post dilation as described. Hiatal hernia. Abnormal gastric mucosa of uncertain significance status post gastric biospy. +H.pylori  . ESOPHAGOGASTRODUODENOSCOPY N/A 11/17/2017   Procedure: ESOPHAGOGASTRODUODENOSCOPY (EGD);  Surgeon: Rogene Houston, MD;  [facility];  Service: Endoscopy;  Laterality: N/A;  . PERCUTANEOUS STENT INTERVENTION    . SKIN CANCER EXCISION  2015   ear & neck  . TONSILLECTOMY       No outpatient medications have been marked as taking for the 02/24/19 encounter (Appointment) with Arnoldo Lenis, MD.     Allergies:   Patient has no known allergies.   Social History   Tobacco Use  . Smoking status: Former Smoker    Packs/day: 3.00    Years: 12.00    Pack years: 36.00    Types: Cigarettes    Start date: 09/15/1983    Quit date: 09/14/1994    Years since quitting: 24.4  . Smokeless tobacco: Current User    Types: Chew  . Tobacco comment: chews 80% of a pack tobacco per day -- 10/12 doesnt chew too much  Substance Use Topics  . Alcohol use: No    Alcohol/week: 0.0 standard drinks  . Drug use: No     Family Hx: The patient's family history includes Heart disease in his brother, father, and mother; Stomach cancer in his brother. There is no history of Colon cancer.  ROS:   Please see the history of present illness.     All other systems reviewed and are negative.   Prior CV studies:   The following studies were reviewed today:   08/2016 AAA [place] No aneurysm  Labs/Other Tests and Data Reviewed:    EKG:  No ECG reviewed.  Recent Labs: 03/25/2018: BUN 18; Creat 1.16; Potassium 4.3;  Sodium 141   Recent Lipid Panel Lab Results  Component Value Date/Time   CHOL 129 03/25/2018 07:43 AM   TRIG 76 03/25/2018 07:43 AM   HDL 38 (L) 03/25/2018 07:43 AM   CHOLHDL 3.4 03/25/2018 07:43 AM   LDLCALC 75 03/25/2018 07:43 AM    Wt Readings from Last 3 Encounters:  10/05/18 177 lb 14.4 oz (80.7 kg)  02/23/18 179 lb (81.2 kg)  11/16/17 189 lb 9.5 oz (86 kg)     Objective:    Vital Signs:   Today's Vitals   02/24/19 1425  Weight: 179 lb (81.2 kg)  Height: 6' (1.829 m)   Body mass index is 24.28 kg/m.  Normal affect. Normal speech pattern and tone. Comfortable, no  apaprent distress, No audible signs of SOB or wheezing.   ASSESSMENT & PLAN:    1. CAD - no significant symptoms, continue current meds  2. Hyperlipidemia - overall LDL at goal. Has been on simva long time, we have elected to continue   3. HTN - continue current meds    COVID-19 Education: The signs and symptoms of COVID-19 were discussed with the patient and how to seek care for testing (follow up with PCP or arrange E-visit).  The importance of social distancing was discussed today.  Time:   Today, I have spent 12 minutes with the patient with telehealth technology discussing the above problems.     Medication Adjustments/Labs and Tests Ordered: Current medicines are reviewed at length with the patient today.  Concerns regarding medicines are outlined above.   Tests Ordered: No orders of the defined types were placed in this encounter.   Medication Changes: No orders of the defined types were placed in this encounter.   Follow Up:  F/u 6 months  Signed, Carlyle Dolly, MD  02/24/2019 12:35 PM    [facility]

## 2019-02-24 NOTE — Patient Instructions (Signed)

## 2019-03-15 ENCOUNTER — Telehealth (INDEPENDENT_AMBULATORY_CARE_PROVIDER_SITE_OTHER): Payer: Self-pay | Admitting: *Deleted

## 2019-03-15 NOTE — Telephone Encounter (Signed)
agree

## 2019-03-15 NOTE — Telephone Encounter (Signed)
Referring MD/PCP: kim   Procedure: tcs  Reason/Indication:  screening  Has patient had this procedure before?  Yes, 2019 -- poor prep             If so, when, by whom and where?    Is there a family history of colon cancer?  no             Who?  What age when diagnosed?    Is patient diabetic?   no                                                  Does patient have prosthetic heart valve or mechanical valve?  no  Do you have a pacemaker?  no  Has patient ever had endocarditis? no  Has patient had joint replacement within last 12 months?  no  Is patient constipated or do they take laxatives? no  Does patient have a history of alcohol/drug use?  no  Is patient on blood thinner such as Coumadin, Plavix and/or Aspirin? yes  Medications: asa 81 mg daily, lisinopril 5 mg daily, pantoprazole 40 mg daily, simvastatin 40 mg daily, tylenol 500 mg 1-2 tab, benadryl 1/2 tab nightly  Allergies: nkda  Medication Adjustment per Dr Lindi Adie, NP: asa 2 days  Procedure date & time: 04/13/19 at 830

## 2019-03-20 DIAGNOSIS — J069 Acute upper respiratory infection, unspecified: Secondary | ICD-10-CM | POA: Diagnosis not present

## 2019-03-20 DIAGNOSIS — J441 Chronic obstructive pulmonary disease with (acute) exacerbation: Secondary | ICD-10-CM | POA: Diagnosis not present

## 2019-03-20 DIAGNOSIS — H6692 Otitis media, unspecified, left ear: Secondary | ICD-10-CM | POA: Diagnosis not present

## 2019-04-10 DIAGNOSIS — H6121 Impacted cerumen, right ear: Secondary | ICD-10-CM | POA: Diagnosis not present

## 2019-04-10 DIAGNOSIS — J441 Chronic obstructive pulmonary disease with (acute) exacerbation: Secondary | ICD-10-CM | POA: Diagnosis not present

## 2019-04-10 DIAGNOSIS — H6122 Impacted cerumen, left ear: Secondary | ICD-10-CM | POA: Diagnosis not present

## 2019-04-11 ENCOUNTER — Other Ambulatory Visit (HOSPITAL_COMMUNITY): Payer: Medicare Other

## 2019-04-13 ENCOUNTER — Encounter (HOSPITAL_COMMUNITY): Admission: RE | Payer: Self-pay | Source: Home / Self Care

## 2019-04-13 ENCOUNTER — Ambulatory Visit (HOSPITAL_COMMUNITY): Admission: RE | Admit: 2019-04-13 | Payer: Medicare Other | Source: Home / Self Care | Admitting: Internal Medicine

## 2019-04-13 DIAGNOSIS — Z1211 Encounter for screening for malignant neoplasm of colon: Principal | ICD-10-CM

## 2019-04-13 SURGERY — COLONOSCOPY
Anesthesia: Moderate Sedation

## 2019-04-17 ENCOUNTER — Observation Stay (HOSPITAL_COMMUNITY): Payer: Medicare Other

## 2019-04-17 ENCOUNTER — Other Ambulatory Visit: Payer: Self-pay

## 2019-04-17 ENCOUNTER — Inpatient Hospital Stay (HOSPITAL_COMMUNITY)
Admission: EM | Admit: 2019-04-17 | Discharge: 2019-04-19 | DRG: 179 | Disposition: A | Discharge status: Home / Self Care | Payer: Medicare Other | Attending: Internal Medicine | Admitting: Internal Medicine

## 2019-04-17 ENCOUNTER — Emergency Department (HOSPITAL_COMMUNITY): Payer: Medicare Other

## 2019-04-17 ENCOUNTER — Encounter (HOSPITAL_COMMUNITY): Payer: Self-pay | Admitting: Emergency Medicine

## 2019-04-17 DIAGNOSIS — E782 Mixed hyperlipidemia: Secondary | ICD-10-CM | POA: Diagnosis present

## 2019-04-17 DIAGNOSIS — I10 Essential (primary) hypertension: Secondary | ICD-10-CM | POA: Diagnosis present

## 2019-04-17 DIAGNOSIS — R1013 Epigastric pain: Secondary | ICD-10-CM | POA: Diagnosis present

## 2019-04-17 DIAGNOSIS — Z9842 Cataract extraction status, left eye: Secondary | ICD-10-CM | POA: Diagnosis not present

## 2019-04-17 DIAGNOSIS — K5730 Diverticulosis of large intestine without perforation or abscess without bleeding: Secondary | ICD-10-CM | POA: Diagnosis present

## 2019-04-17 DIAGNOSIS — K219 Gastro-esophageal reflux disease without esophagitis: Secondary | ICD-10-CM | POA: Diagnosis present

## 2019-04-17 DIAGNOSIS — Z20828 Contact with and (suspected) exposure to other viral communicable diseases: Secondary | ICD-10-CM | POA: Diagnosis not present

## 2019-04-17 DIAGNOSIS — Z8249 Family history of ischemic heart disease and other diseases of the circulatory system: Secondary | ICD-10-CM

## 2019-04-17 DIAGNOSIS — I2510 Atherosclerotic heart disease of native coronary artery without angina pectoris: Secondary | ICD-10-CM | POA: Diagnosis present

## 2019-04-17 DIAGNOSIS — J449 Chronic obstructive pulmonary disease, unspecified: Secondary | ICD-10-CM | POA: Diagnosis not present

## 2019-04-17 DIAGNOSIS — Z720 Tobacco use: Secondary | ICD-10-CM | POA: Diagnosis not present

## 2019-04-17 DIAGNOSIS — Z79899 Other long term (current) drug therapy: Secondary | ICD-10-CM | POA: Diagnosis not present

## 2019-04-17 DIAGNOSIS — U071 COVID-19: Secondary | ICD-10-CM | POA: Diagnosis not present

## 2019-04-17 DIAGNOSIS — Z7982 Long term (current) use of aspirin: Secondary | ICD-10-CM

## 2019-04-17 DIAGNOSIS — I252 Old myocardial infarction: Secondary | ICD-10-CM | POA: Diagnosis not present

## 2019-04-17 DIAGNOSIS — H9190 Unspecified hearing loss, unspecified ear: Secondary | ICD-10-CM | POA: Diagnosis present

## 2019-04-17 DIAGNOSIS — G4730 Sleep apnea, unspecified: Secondary | ICD-10-CM | POA: Diagnosis not present

## 2019-04-17 DIAGNOSIS — E785 Hyperlipidemia, unspecified: Secondary | ICD-10-CM | POA: Diagnosis present

## 2019-04-17 DIAGNOSIS — Z800 Family history of malignant neoplasm of digestive organs: Secondary | ICD-10-CM

## 2019-04-17 DIAGNOSIS — J988 Other specified respiratory disorders: Secondary | ICD-10-CM | POA: Diagnosis not present

## 2019-04-17 DIAGNOSIS — Z9841 Cataract extraction status, right eye: Secondary | ICD-10-CM

## 2019-04-17 DIAGNOSIS — E860 Dehydration: Secondary | ICD-10-CM | POA: Diagnosis present

## 2019-04-17 DIAGNOSIS — Z961 Presence of intraocular lens: Secondary | ICD-10-CM | POA: Diagnosis present

## 2019-04-17 DIAGNOSIS — Z9049 Acquired absence of other specified parts of digestive tract: Secondary | ICD-10-CM | POA: Diagnosis not present

## 2019-04-17 DIAGNOSIS — R5381 Other malaise: Secondary | ICD-10-CM | POA: Diagnosis not present

## 2019-04-17 DIAGNOSIS — R05 Cough: Secondary | ICD-10-CM | POA: Diagnosis not present

## 2019-04-17 DIAGNOSIS — R509 Fever, unspecified: Secondary | ICD-10-CM | POA: Diagnosis not present

## 2019-04-17 DIAGNOSIS — R0602 Shortness of breath: Secondary | ICD-10-CM | POA: Diagnosis not present

## 2019-04-17 LAB — CBC WITH DIFFERENTIAL/PLATELET
Abs Immature Granulocytes: 0.01 10*3/uL (ref 0.00–0.07)
Basophils Absolute: 0 10*3/uL (ref 0.0–0.1)
Basophils Relative: 1 %
Eosinophils Absolute: 0 10*3/uL (ref 0.0–0.5)
Eosinophils Relative: 1 %
HCT: 46.3 % (ref 39.0–52.0)
Hemoglobin: 15.4 g/dL (ref 13.0–17.0)
Immature Granulocytes: 1 %
Lymphocytes Relative: 22 %
Lymphs Abs: 0.5 10*3/uL — ABNORMAL LOW (ref 0.7–4.0)
MCH: 29.8 pg (ref 26.0–34.0)
MCHC: 33.3 g/dL (ref 30.0–36.0)
MCV: 89.7 fL (ref 80.0–100.0)
Monocytes Absolute: 0.2 10*3/uL (ref 0.1–1.0)
Monocytes Relative: 7 %
Neutro Abs: 1.4 10*3/uL — ABNORMAL LOW (ref 1.7–7.7)
Neutrophils Relative %: 68 %
Platelets: 106 10*3/uL — ABNORMAL LOW (ref 150–400)
RBC: 5.16 MIL/uL (ref 4.22–5.81)
RDW: 13.4 % (ref 11.5–15.5)
WBC: 2.1 10*3/uL — ABNORMAL LOW (ref 4.0–10.5)
nRBC: 0 % (ref 0.0–0.2)

## 2019-04-17 LAB — BRAIN NATRIURETIC PEPTIDE: B Natriuretic Peptide: 12 pg/mL (ref 0.0–100.0)

## 2019-04-17 LAB — URINALYSIS, ROUTINE W REFLEX MICROSCOPIC
Bilirubin Urine: NEGATIVE
Glucose, UA: NEGATIVE mg/dL
Hgb urine dipstick: NEGATIVE
Ketones, ur: NEGATIVE mg/dL
Leukocytes,Ua: NEGATIVE
Nitrite: NEGATIVE
Protein, ur: NEGATIVE mg/dL
Specific Gravity, Urine: 1.027 (ref 1.005–1.030)
pH: 5 (ref 5.0–8.0)

## 2019-04-17 LAB — D-DIMER, QUANTITATIVE: D-Dimer, Quant: 0.39 ug/mL-FEU (ref 0.00–0.50)

## 2019-04-17 LAB — FERRITIN: Ferritin: 170 ng/mL (ref 24–336)

## 2019-04-17 LAB — TROPONIN I (HIGH SENSITIVITY)
Troponin I (High Sensitivity): 4 ng/L (ref <18)
Troponin I (High Sensitivity): 3 ng/L (ref <18)

## 2019-04-17 LAB — APTT: aPTT: 33 seconds (ref 24–36)

## 2019-04-17 LAB — COMPREHENSIVE METABOLIC PANEL
ALT: 15 U/L (ref 0–44)
AST: 21 U/L (ref 15–41)
Albumin: 3.6 g/dL (ref 3.5–5.0)
Alkaline Phosphatase: 68 U/L (ref 38–126)
Anion gap: 10 (ref 5–15)
BUN: 17 mg/dL (ref 8–23)
CO2: 25 mmol/L (ref 22–32)
Calcium: 8.5 mg/dL — ABNORMAL LOW (ref 8.9–10.3)
Chloride: 100 mmol/L (ref 98–111)
Creatinine, Ser: 1.16 mg/dL (ref 0.61–1.24)
GFR calc Af Amer: >60 mL/min (ref >60)
GFR calc non Af Amer: 59 mL/min — ABNORMAL LOW (ref >60)
Glucose, Bld: 109 mg/dL — ABNORMAL HIGH (ref 70–99)
Potassium: 3.5 mmol/L (ref 3.5–5.1)
Sodium: 135 mmol/L (ref 135–145)
Total Bilirubin: 0.6 mg/dL (ref 0.3–1.2)
Total Protein: 6.5 g/dL (ref 6.5–8.1)

## 2019-04-17 LAB — C-REACTIVE PROTEIN: CRP: 3.8 mg/dL — ABNORMAL HIGH (ref <1.0)

## 2019-04-17 LAB — LACTIC ACID, PLASMA: Lactic Acid, Venous: 2.1 mmol/L (ref 0.5–1.9)

## 2019-04-17 LAB — ABO/RH: ABO/RH(D): O NEG

## 2019-04-17 LAB — PROTIME-INR
INR: 1 (ref 0.8–1.2)
Prothrombin Time: 12.8 seconds (ref 11.4–15.2)

## 2019-04-17 LAB — SARS CORONAVIRUS 2 BY RT PCR (HOSPITAL ORDER, PERFORMED IN ~~LOC~~ HOSPITAL LAB): SARS Coronavirus 2: POSITIVE — AB

## 2019-04-17 MED ORDER — SODIUM CHLORIDE 0.9 % IV BOLUS
500.0000 mL | Freq: Once | INTRAVENOUS | Status: AC
  Administered 2019-04-17: 500 mL via INTRAVENOUS

## 2019-04-17 MED ORDER — ACETAMINOPHEN 325 MG PO TABS: 650.0000 mg | ORAL_TABLET | Freq: Four times a day (QID) | ORAL | Status: DC | PRN

## 2019-04-17 MED ORDER — ENOXAPARIN SODIUM 40 MG/0.4ML ~~LOC~~ SOLN
40.0000 mg | SUBCUTANEOUS | Status: DC
  Administered 2019-04-18 – 2019-04-19 (×2): 40 mg via SUBCUTANEOUS
  Filled 2019-04-17 (×2): qty 0.4

## 2019-04-17 MED ORDER — METHYLPREDNISOLONE SODIUM SUCC 125 MG IJ SOLR
80.0000 mg | Freq: Three times a day (TID) | INTRAMUSCULAR | Status: DC
  Administered 2019-04-17 – 2019-04-18 (×3): 80 mg via INTRAVENOUS
  Filled 2019-04-17 (×3): qty 2

## 2019-04-17 MED ORDER — ALBUTEROL SULFATE HFA 108 (90 BASE) MCG/ACT IN AERS
1.0000 | INHALATION_SPRAY | Freq: Four times a day (QID) | RESPIRATORY_TRACT | Status: DC | PRN
  Filled 2019-04-17: qty 6.7

## 2019-04-17 MED ORDER — PANTOPRAZOLE SODIUM 40 MG PO TBEC
40.0000 mg | DELAYED_RELEASE_TABLET | Freq: Every day | ORAL | Status: DC
  Administered 2019-04-18 – 2019-04-19 (×2): 40 mg via ORAL
  Filled 2019-04-17 (×2): qty 1

## 2019-04-17 MED ORDER — FAMOTIDINE 20 MG PO TABS
20.0000 mg | ORAL_TABLET | Freq: Two times a day (BID) | ORAL | Status: DC
  Administered 2019-04-17 – 2019-04-19 (×5): 20 mg via ORAL
  Filled 2019-04-17 (×5): qty 1

## 2019-04-17 MED ORDER — ASPIRIN EC 81 MG PO TBEC
81.0000 mg | DELAYED_RELEASE_TABLET | Freq: Every evening | ORAL | Status: DC
  Administered 2019-04-18 (×2): 81 mg via ORAL
  Filled 2019-04-17 (×2): qty 1

## 2019-04-17 MED ORDER — SIMVASTATIN 20 MG PO TABS
40.0000 mg | ORAL_TABLET | Freq: Every evening | ORAL | Status: DC
  Administered 2019-04-18 (×2): 40 mg via ORAL
  Filled 2019-04-17 (×3): qty 2

## 2019-04-17 MED ORDER — DIPHENHYDRAMINE HCL 25 MG PO CAPS: 25.0000 mg | ORAL_CAPSULE | Freq: Every evening | ORAL | Status: DC | PRN

## 2019-04-17 MED ORDER — NITROGLYCERIN 0.4 MG SL SUBL: 0.4000 mg | SUBLINGUAL_TABLET | SUBLINGUAL | Status: DC | PRN

## 2019-04-17 NOTE — ED Provider Notes (Signed)
[facility] EMERGENCY DEPARTMENT Provider Note   CSN: [id number] Arrival date & time: 04/17/19  9379     History   Chief Complaint Chief Complaint  Patient presents with   Shortness of Breath    HPI Jeremy [name] is a 83 y.o. male.     HPI Pt was seen at 0935. Per pt and UCC report to ED:  Pt c/o gradual onset and worsening of persistent generalized weakness/fatigue for the past 2 weeks, worse over the past 1 week. Pt states he has been evaluated at [facility] x2 for same, rx abx without improvement. Pt states he was exposed to COVID+ individual that came to his house and visited him approximately 10 days ago. Pt states his symptoms worsened approximately 7 days after that. Symptoms include: fever to 101.7, chills, loss of taste, SOB, cough, loss of appetite, generalized weakness/fatigue, bilat calves "pains." UCC sent pt to ED today for concern regarding +COVID. Denies CP/palpitations, no abd pain, no N/V/D, no rash, no neck or back pain, no injury, no falls, no focal motor weakness, no tingling/numbness in extremities.     Past Medical History:  Diagnosis Date   Abnormal CXR 4/20011   Chest  CT mode/serere COPD ( no mediastinal abnormality)   CAD (coronary artery disease) 2005   anterior MI with stent to the LAD in 1991 / stent 1996 / nuclear 2005, no ischemia   Carotid bruit    Doppler, June, 2013, 0-39% bilateral   COPD (chronic obstructive pulmonary disease) ([place])    Diverticula of colon    Ejection fraction    55-60%, echo, April, 2011, could not estimate RV pressure   Esophagitis    HOH (hard of hearing)    Hyperlipidemia    Hypertension    Myocardial infarction ([place])    Shortness of breath    with exertion   Sleep apnea    Stop Bang score of 5   Tobacco abuse     Patient Active Problem List   Diagnosis Date Noted   Special screening for malignant neoplasms, colon 09/21/2018   Diverticulosis of large intestine with hemorrhage    GI bleed due to  NSAIDs 11/17/2017   Rectal bleed 11/16/2017   GI bleed 11/16/2017   Hypokalemia 11/16/2017   Chest pain 05/02/2016   H. pylori infection 08/15/2015   Mucosal abnormality of stomach    Reflux esophagitis    Peptic stricture of esophagus    Rectal bleeding 05/21/2015   Dyspepsia 05/21/2015   Dysphagia, pharyngoesophageal phase 05/21/2015   Sinus bradycardia 03/19/2014   Carotid artery disease ([place]) 03/15/2014   Hypertension    Hyperlipidemia    CAD (coronary artery disease)    Abnormal CXR    Ejection fraction    Tobacco abuse    COPD (chronic obstructive pulmonary disease) ([place])     Past Surgical History:  Procedure Laterality Date   APPENDECTOMY     BIOPSY  11/17/2017   Procedure: BIOPSY;  Surgeon: Rogene Houston, MD;  [facility];  Service: Endoscopy;;  gastric   CARDIAC CATHETERIZATION     CATARACT EXTRACTION Left    CATARACT EXTRACTION W/PHACO Right 01/22/2014   Procedure: CATARACT EXTRACTION PHACO AND INTRAOCULAR LENS PLACEMENT ([place]);  Surgeon: Tonny Branch, MD;  Location: [facility];  Service: Ophthalmology;  Laterality: Right;  CDE 7.46   CHOLECYSTECTOMY     8 years ago [facility]   COLONOSCOPY  10/05/2012   Dr. Laural Golden: multiple diverticula at sigmoid colon, multiple polyps (tubular  adenomas), small external hemorrhoids   COLONOSCOPY N/A 11/18/2017   Procedure: COLONOSCOPY;  Surgeon: Rogene Houston, MD;  [facility];  Service: Endoscopy;  Laterality: N/A;   CYST REMOVAL HAND     right wrist   ESOPHAGEAL DILATION N/A 05/23/2015   Procedure: ESOPHAGEAL DILATION;  Surgeon: Daneil Dolin, MD;  [facility];  Service: Endoscopy;  Laterality: N/A;   ESOPHAGEAL DILATION N/A 11/17/2017   Procedure: ESOPHAGEAL DILATION;  Surgeon: Rogene Houston, MD;  [facility];  Service: Endoscopy;  Laterality: N/A;   ESOPHAGOGASTRODUODENOSCOPY N/A 05/23/2015   Dr. Gala Romney: Ulcerative/ erosive reflux esophagitis with peptic  stricture formation. Status post dilation as described. Hiatal hernia. Abnormal gastric mucosa of uncertain significance status post gastric biospy. +H.pylori   ESOPHAGOGASTRODUODENOSCOPY N/A 11/17/2017   Procedure: ESOPHAGOGASTRODUODENOSCOPY (EGD);  Surgeon: Rogene Houston, MD;  [facility];  Service: Endoscopy;  Laterality: N/A;   PERCUTANEOUS STENT INTERVENTION     SKIN CANCER EXCISION  2015   ear & neck   TONSILLECTOMY          Home Medications    Prior to Admission medications   Medication Sig Start Date End Date Taking? Authorizing Provider  acetaminophen (TYLENOL) 500 MG tablet Take 500-1,000 mg by mouth daily as needed for moderate pain or headache.    [provider]  albuterol (PROVENTIL HFA;VENTOLIN HFA) 108 (90 Base) MCG/ACT inhaler Inhale 1-2 puffs into the lungs every 6 (six) hours as needed for wheezing or shortness of breath.    [provider]  aspirin 81 MG tablet Take 1 tablet (81 mg total) by mouth every evening. 11/20/17   Barton Dubois, MD  Aspirin-Caffeine [facility] FAST PAIN RELIEF PO) Take 1 packet by mouth daily as needed (pain).    [provider]  diphenhydrAMINE (BENADRYL) 25 MG tablet Take 25 mg by mouth at bedtime as needed for sleep.    [provider]  lisinopril (PRINIVIL,ZESTRIL) 5 MG tablet TAKE 1 TABLET BY MOUTH DAILY Patient taking differently: Take 5 mg by mouth daily.  12/08/18   Arnoldo Lenis, MD  nitroGLYCERIN (NITROSTAT) 0.4 MG SL tablet PLACE 1 TABLET UNDER THE TONGUE EVERY 5 MINUTES FOR 3 DOSES AS NEEDED FOR CHEST PAIN Patient taking differently: Place 0.4 mg under the tongue every 5 (five) minutes as needed for chest pain.  08/11/16   Arnoldo Lenis, MD  OVER THE COUNTER MEDICATION Apply 1 application topically daily as needed (pain). [place] gel    [provider]  pantoprazole (PROTONIX) 40 MG tablet TAKE 1 TABLET(40 MG) BY MOUTH DAILY Patient taking differently: Take 40 mg by mouth  daily.  01/16/19   Arnoldo Lenis, MD  simvastatin (ZOCOR) 40 MG tablet TAKE 1 TABLET BY MOUTH EVERY EVENING Patient taking differently: Take 40 mg by mouth every evening.  10/17/18   Arnoldo Lenis, MD    Family History Family History  Problem Relation Age of Onset   Heart disease Mother    Heart disease Father    Heart disease Brother    Stomach cancer Brother    Colon cancer Neg Hx     Social History Social History   Tobacco Use   Smoking status: Former Smoker    Packs/day: 3.00    Years: 12.00    Pack years: 36.00    Types: Cigarettes    Start date: 09/15/1983    Quit date: 09/14/1994    Years since quitting: 24.6  Smokeless tobacco: Current User    Types: Chew   Tobacco comment: chews 80% of a pack tobacco per day -- 10/12 doesnt chew too much  Substance Use Topics   Alcohol use: No    Alcohol/week: 0.0 standard drinks   Drug use: No     Allergies   Patient has no known allergies.   Review of Systems Review of Systems ROS: Statement: All systems negative except as marked or noted in the HPI; Constitutional: +fever and chills, generalized weakness/fatigue. ; ; Eyes: Negative for eye pain, redness and discharge. ; ; ENMT: Negative for ear pain, hoarseness, nasal congestion, sinus pressure and sore throat. ; ; Cardiovascular: Negative for chest pain, palpitations, diaphoresis, dyspnea and peripheral edema. ; ; Respiratory: Negative for cough, wheezing and stridor. ; ; Gastrointestinal: Negative for nausea, vomiting, diarrhea, abdominal pain, blood in stool, hematemesis, jaundice and rectal bleeding. . ; ; Genitourinary: Negative for dysuria, flank pain and hematuria. ; ; Musculoskeletal: Negative for back pain and neck pain. Negative for swelling and trauma.; ; Skin: Negative for pruritus, rash, abrasions, blisters, bruising and skin lesion.; ; Neuro: Negative for headache, lightheadedness and neck stiffness. Negative for altered level of consciousness, altered  mental status, extremity weakness, paresthesias, involuntary movement, seizure and syncope.      Physical Exam Updated Vital Signs BP 138/76 (BP Location: Right Arm)    Pulse 85    Temp 99.3 F (37.4 C) (Oral)    Resp 16    Ht 6' (1.829 m)    Wt 81.6 kg    SpO2 97%    BMI 24.41 kg/m    Patient Vitals for the past 24 hrs:  BP Temp Temp src Pulse Resp SpO2 Height Weight  04/17/19 1300 110/83 -- -- -- -- -- -- --  04/17/19 1230 131/67 -- -- 75 (!) 33 96 % -- --  04/17/19 1200 125/66 -- -- 82 (!) 23 95 % -- --  04/17/19 1130 110/72 -- -- 85 (!) 29 91 % -- --  04/17/19 1100 115/63 -- -- 83 (!) 29 92 % -- --  04/17/19 1030 118/64 -- -- 77 (!) 26 92 % -- --  04/17/19 1015 -- -- -- 86 (!) 26 93 % -- --  04/17/19 1000 123/73 -- -- 83 (!) 27 93 % -- --  04/17/19 0933 -- -- -- -- -- -- 6' (1.829 m) 81.6 kg  04/17/19 0932 138/76 99.3 F (37.4 C) Oral 85 16 97 % -- --   12:01:47 Orthostatic Vital Signs HC  Orthostatic Lying   BP- Lying: 136/75  Pulse- Lying: 86      Orthostatic Sitting  BP- Sitting: 118/71  Pulse- Sitting: 87      Orthostatic Standing at 0 minutes  BP- Standing at 0 minutes: 117/69  Pulse- Standing at 0 minutes: 93     Physical Exam 0940: Physical examination:  Nursing notes reviewed; Vital signs and O2 SAT reviewed;  Constitutional: Well developed, Well nourished, In no acute distress; Head:  Normocephalic, atraumatic; Eyes: EOMI, PERRL, No scleral icterus; ENMT: Mouth and pharynx normal, Mucous membranes dry; Neck: Supple, Full range of motion, No lymphadenopathy; Cardiovascular: Regular rate and rhythm, No gallop; Respiratory: Breath sounds coarse & equal bilaterally, No wheezes.  Speaking sentences, tachypnea, Normal respiratory effort/excursion; Chest: Nontender, Movement normal; Abdomen: Soft, Nontender, Nondistended, Normal bowel sounds; Genitourinary: No CVA tenderness; Extremities: Peripheral pulses normal, No tenderness, No edema, No calf tenderness, edema or  asymmetry.; Neuro: AA&Ox3, Major CN grossly intact. No  facial droop. Speech clear. No gross focal motor or sensory deficits in extremities.; Skin: Color normal, Warm, Dry.     ED Treatments / Results  Labs (all labs ordered are listed, but only abnormal results are displayed)   EKG EKG Interpretation  Date/Time:  Monday April 17 2019 09:43:58 EDT Ventricular Rate:  89 PR Interval:    QRS Duration: 96 QT Interval:  346 QTC Calculation: 421 R Axis:   -34 Text Interpretation:  Sinus rhythm Left axis deviation Baseline wander When compared with ECG of 11/16/2017 No significant change was found Confirmed by Francine Graven [phone]) on 04/17/2019 10:16:58 AM   Radiology   Procedures Procedures (including critical care time)  Medications Ordered in ED Medications - No data to display   Initial Impression / Assessment and Plan / ED Course  I have reviewed the triage vital signs and the nursing notes.  Pertinent labs & imaging results that were available during my care of the patient were reviewed by me and considered in my medical decision making (see chart for details).     MDM Reviewed: previous chart, nursing note and vitals Reviewed previous: labs and ECG Interpretation: labs, ECG, x-ray and ultrasound    Results for orders placed or performed during the hospital encounter of 04/17/19  Blood Culture (routine x 2)   Specimen: BLOOD LEFT FOREARM  Result Value Ref Range   Specimen Description BLOOD LEFT FOREARM    Special Requests      BOTTLES DRAWN AEROBIC AND ANAEROBIC Blood Culture adequate volume Performed at [facility], [address] [place], [place] [address]    Culture PENDING    Report Status PENDING   Blood Culture (routine x 2)   Specimen: BLOOD RIGHT FOREARM  Result Value Ref Range   Specimen Description BLOOD RIGHT FOREARM    Special Requests      BOTTLES DRAWN AEROBIC AND ANAEROBIC Blood Culture adequate volume Performed at [facility],  [address], [place], [place] [address]    Culture PENDING    Report Status PENDING   SARS Coronavirus 2 [facility] order, Performed in [place] hospital lab)  Result Value Ref Range   SARS Coronavirus 2 POSITIVE (A) NEGATIVE  Lactic acid, plasma  Result Value Ref Range   Lactic Acid, Venous 2.1 (HH) 0.5 - 1.9 mmol/L  Comprehensive metabolic panel  Result Value Ref Range   Sodium 135 135 - 145 mmol/L   Potassium 3.5 3.5 - 5.1 mmol/L   Chloride 100 98 - 111 mmol/L   CO2 25 22 - 32 mmol/L   Glucose, Bld 109 (H) 70 - 99 mg/dL   BUN 17 8 - 23 mg/dL   Creatinine, Ser 1.16 0.61 - 1.24 mg/dL   Calcium 8.5 (L) 8.9 - 10.3 mg/dL   Total Protein 6.5 6.5 - 8.1 g/dL   Albumin 3.6 3.5 - 5.0 g/dL   AST 21 15 - 41 U/L   ALT 15 0 - 44 U/L   Alkaline Phosphatase 68 38 - 126 U/L   Total Bilirubin 0.6 0.3 - 1.2 mg/dL   GFR calc non Af Amer 59 (L) >60 mL/min   GFR calc Af Amer >60 >60 mL/min   Anion gap 10 5 - 15  CBC WITH DIFFERENTIAL  Result Value Ref Range   WBC 2.1 (L) 4.0 - 10.5 K/uL   RBC 5.16 4.22 - 5.81 MIL/uL   Hemoglobin 15.4 13.0 - 17.0 g/dL   HCT 46.3 39.0 - 52.0 %   MCV 89.7  80.0 - 100.0 fL   MCH 29.8 26.0 - 34.0 pg   MCHC 33.3 30.0 - 36.0 g/dL   RDW 13.4 11.5 - 15.5 %   Platelets 106 (L) 150 - 400 K/uL   nRBC 0.0 0.0 - 0.2 %   Neutrophils Relative % 68 %   Neutro Abs 1.4 (L) 1.7 - 7.7 K/uL   Lymphocytes Relative 22 %   Lymphs Abs 0.5 (L) 0.7 - 4.0 K/uL   Monocytes Relative 7 %   Monocytes Absolute 0.2 0.1 - 1.0 K/uL   Eosinophils Relative 1 %   Eosinophils Absolute 0.0 0.0 - 0.5 K/uL   Basophils Relative 1 %   Basophils Absolute 0.0 0.0 - 0.1 K/uL   Immature Granulocytes 1 %   Abs Immature Granulocytes 0.01 0.00 - 0.07 K/uL  APTT  Result Value Ref Range   aPTT 33 24 - 36 seconds  Protime-INR  Result Value Ref Range   Prothrombin Time 12.8 11.4 - 15.2 seconds   INR 1.0 0.8 - 1.2  Troponin I (High Sensitivity)  Result Value Ref Range   Troponin I (High Sensitivity) 4  <18 ng/L   Dg Chest Port 1 View Result Date: 04/17/2019 CLINICAL DATA:  Shortness of breath.  Productive cough. EXAM: PORTABLE CHEST 1 VIEW COMPARISON:  05/02/2016. FINDINGS: Mediastinum hilar structures normal. Lungs are clear of acute infiltrates. Chronic mild bibasilar interstitial prominence. No pleural effusion or pneumothorax. Biapical pleural thickening again noted consistent scarring. Heart size normal. No acute bony abnormality. Degenerative change thoracic spine. IMPRESSION: Chronic mild bibasilar interstitial prominence. No acute infiltrates. Electronically Signed   By: Marcello Moores  Register   On: 04/17/2019 10:26    Daleen Bo Hoeschen was evaluated in Emergency Department on 04/17/2019 for the symptoms described in the history of present illness. He was evaluated in the context of the global COVID-19 pandemic, which necessitated consideration that the patient might be at risk for infection with the SARS-CoV-2 virus that causes COVID-19. Institutional protocols and algorithms that pertain to the evaluation of patients at risk for COVID-19 are in a state of rapid change based on information released by regulatory bodies including the CDC and federal and state organizations. These policies and algorithms were followed during the patient's care in the ED.   1320:  Pt orthostatic on VS; judicious IVF given. Pt remains tachypneic at rest, O2 Sats 91% R/A while I speak with him as he sits on stretcher. Pt ambulated with O2 Sats 94-96% R/A.  T/C returned from [facility] Labs, case discussed, including:  HPI, pertinent PM/SHx, VS/PE, dx testing, ED course and treatment:  Agreeable to admit.      Final Clinical Impressions(s) / ED Diagnoses   Final diagnoses:  None    ED Discharge Orders    None       Francine Graven, DO 04/19/19 [id number]

## 2019-04-17 NOTE — H&P (Signed)
TRH H&P   Patient Demographics:    Jeremy [name], is a 83 y.o. male  MRN: [id number]   DOB - 1936/07/26  Admit Date - 04/17/2019  Outpatient Primary MD for the patient is Jani Gravel, MD  Referring MD/NP/PA: Dr Thurnell Garbe  Patient coming from: home  Chief Complaint  Patient presents with  . Shortness of Breath      HPI:    Jeremy [name]  is a 83 y.o. male, with past medical history of CAD, COPD on room air, GERD, hyperlipidemia, hypertension, story of GI bleed last year Requiring hospitalization, patient presents secondary to dyspnea, patient complains of multiple symptoms including generalized weakness, fatigue for the last 2 weeks, it has been worsening over the last week, he had urgent care visit x2 over last 10 days, received antibiotics without treatment, patient reports he was exposed to an individual who is covered positive around 10 days ago, patient reports symptoms worsened over last 7 days, febrile 101.7 at urgent care before coming to any pain ED, as well reports progressive dyspnea over the last 3 days, reports some mild diarrhea as well, loss of sense of taste and smell, reports cough, productive, denies any chest pain, nausea, vomiting, rash, neck pain, back pain, falls. -In ED chest x-ray with no opacities or infiltrate, patient was not hypoxic, but he was noted to be tachypneic, mildly orthostatic, so I was consulted to admit.    Review of systems:    In addition to the HPI above,  Reports Fever-chills, generalized weakness, loss of appetite, loss of sense of smell and taste and. No Headache, No changes with Vision or hearing, No problems swallowing food or Liquids, No Chest pain, reports  Cough and Shortness of Breath, No Abdominal pain, No Nausea or Vommitting, Bowel movements are regular, No Blood in stool or Urine, No dysuria, No new skin rashes or bruises, No new  joints pains-aches,  No new weakness, tingling, numbness in any extremity, No recent weight gain or loss, No polyuria, polydypsia or polyphagia, No significant Mental Stressors.  A full 10 point Review of Systems was done, except as stated above, all other Review of Systems were negative.   With Past History of the following :    Past Medical History:  Diagnosis Date  . Abnormal CXR 4/20011   Chest  CT mode/serere COPD ( no mediastinal abnormality)  . CAD (coronary artery disease) 2005   anterior MI with stent to the LAD in 1991 / stent 1996 / nuclear 2005, no ischemia  . Carotid bruit    Doppler, June, 2013, 0-39% bilateral  . COPD (chronic obstructive pulmonary disease) ([place])   . Diverticula of colon   . Ejection fraction    55-60%, echo, April, 2011, could not estimate RV pressure  . Esophagitis   . HOH (hard of hearing)   . Hyperlipidemia   . Hypertension   .  Myocardial infarction ([place])   . Shortness of breath    with exertion  . Sleep apnea    Stop Bang score of 5  . Tobacco abuse       Past Surgical History:  Procedure Laterality Date  . APPENDECTOMY    . BIOPSY  11/17/2017   Procedure: BIOPSY;  Surgeon: Rogene Houston, MD;  Location: [facility];  Service: Endoscopy;;  gastric  . CARDIAC CATHETERIZATION    . CATARACT EXTRACTION Left   . CATARACT EXTRACTION W/PHACO Right 01/22/2014   Procedure: CATARACT EXTRACTION PHACO AND INTRAOCULAR LENS PLACEMENT (IOC);  Surgeon: Tonny Branch, MD;  Location: [facility];  Service: Ophthalmology;  Laterality: Right;  CDE 7.46  . CHOLECYSTECTOMY     8 years ago [facility]  . COLONOSCOPY  10/05/2012   Dr. Laural Golden: multiple diverticula at sigmoid colon, multiple polyps (tubular adenomas), small external hemorrhoids  . COLONOSCOPY N/A 11/18/2017   Procedure: COLONOSCOPY;  Surgeon: Rogene Houston, MD;  [facility];  Service: Endoscopy;  Laterality: N/A;  . CYST REMOVAL HAND     right wrist  . ESOPHAGEAL DILATION N/A 05/23/2015    Procedure: ESOPHAGEAL DILATION;  Surgeon: Daneil Dolin, MD;  [facility];  Service: Endoscopy;  Laterality: N/A;  . ESOPHAGEAL DILATION N/A 11/17/2017   Procedure: ESOPHAGEAL DILATION;  Surgeon: Rogene Houston, MD;  [facility]: [facility];  Service: Endoscopy;  Laterality: N/A;  . ESOPHAGOGASTRODUODENOSCOPY N/A 05/23/2015   Dr. Gala Romney: Ulcerative/ erosive reflux esophagitis with peptic stricture formation. Status post dilation as described. Hiatal hernia. Abnormal gastric mucosa of uncertain significance status post gastric biospy. +H.pylori  . ESOPHAGOGASTRODUODENOSCOPY N/A 11/17/2017   Procedure: ESOPHAGOGASTRODUODENOSCOPY (EGD);  Surgeon: Rogene Houston, MD;  [facility];  Service: Endoscopy;  Laterality: N/A;  . PERCUTANEOUS STENT INTERVENTION    . SKIN CANCER EXCISION  2015   ear & neck  . TONSILLECTOMY        Social History:     Social History   Tobacco Use  . Smoking status: Former Smoker    Packs/day: 3.00    Years: 12.00    Pack years: 36.00    Types: Cigarettes    Start date: 09/15/1983    Quit date: 09/14/1994    Years since quitting: 24.6  . Smokeless tobacco: Current User    Types: Chew  . Tobacco comment: chews 80% of a pack tobacco per day -- 10/12 doesnt chew too much  Substance Use Topics  . Alcohol use: No    Alcohol/week: 0.0 standard drinks       Family History :     Family History  Problem Relation Age of Onset  . Heart disease Mother   . Heart disease Father   . Heart disease Brother   . Stomach cancer Brother   . Colon cancer Neg Hx       Home Medications:   Prior to Admission medications   Medication Sig Start Date End Date Taking? Authorizing Provider  acetaminophen (TYLENOL) 500 MG tablet Take 500-1,000 mg by mouth daily as needed for moderate pain or headache.    [provider]  albuterol (PROVENTIL HFA;VENTOLIN HFA) 108 (90 Base) MCG/ACT inhaler Inhale 1-2 puffs into the lungs every 6 (six) hours as  needed for wheezing or shortness of breath.    [provider]  aspirin 81 MG tablet Take 1 tablet (81 mg total) by mouth every evening. 11/20/17   Barton Dubois, MD  Aspirin-Caffeine (  BC FAST PAIN RELIEF PO) Take 1 packet by mouth daily as needed (pain).    [provider]  diphenhydrAMINE (BENADRYL) 25 MG tablet Take 25 mg by mouth at bedtime as needed for sleep.    [provider]  lisinopril (PRINIVIL,ZESTRIL) 5 MG tablet TAKE 1 TABLET BY MOUTH DAILY Patient taking differently: Take 5 mg by mouth daily.  12/08/18   Arnoldo Lenis, MD  nitroGLYCERIN (NITROSTAT) 0.4 MG SL tablet PLACE 1 TABLET UNDER THE TONGUE EVERY 5 MINUTES FOR 3 DOSES AS NEEDED FOR CHEST PAIN Patient taking differently: Place 0.4 mg under the tongue every 5 (five) minutes as needed for chest pain.  08/11/16   Arnoldo Lenis, MD  OVER THE COUNTER MEDICATION Apply 1 application topically daily as needed (pain). [place] gel    [provider]  pantoprazole (PROTONIX) 40 MG tablet TAKE 1 TABLET(40 MG) BY MOUTH DAILY Patient taking differently: Take 40 mg by mouth daily.  01/16/19   Arnoldo Lenis, MD  simvastatin (ZOCOR) 40 MG tablet TAKE 1 TABLET BY MOUTH EVERY EVENING Patient taking differently: Take 40 mg by mouth every evening.  10/17/18   Arnoldo Lenis, MD     Allergies:    No Known Allergies   Physical Exam:   Vitals  Blood pressure 122/72, pulse 75, temperature 99.3 F (37.4 C), temperature source Oral, resp. rate (!) 26, height 6' (1.829 m), weight 81.6 kg, SpO2 96 %.   1. General well-developed male laying in bed in no apparent distress  2. Normal affect and insight, Not Suicidal or Homicidal, Awake Alert, Oriented X 3.  3. No F.N deficits, ALL C.Nerves Intact, Strength 5/5 all 4 extremities, Sensation intact all 4 extremities, Plantars down going.  4. Ears and Eyes appear Normal, Conjunctivae clear, PERRLA. Moist Oral Mucosa.  5. Supple Neck, No JVD, No  cervical lymphadenopathy appriciated, No Carotid Bruits.  6. Symmetrical Chest wall movement, Good air movement bilaterally, scattered wheezing.  7. RRR, No Gallops, Rubs or Murmurs, No Parasternal Heave.  8. Positive Bowel Sounds, Abdomen Soft, No tenderness, No organomegaly appriciated,No rebound -guarding or rigidity.  9.  No Cyanosis, Normal Skin Turgor, No Skin Rash or Bruise.  10. Good muscle tone,  joints appear normal , no effusions, Normal ROM.  11. No Palpable Lymph Nodes in Neck or Axillae    Data Review:    CBC Recent Labs  Lab 04/17/19 0959  WBC 2.1*  HGB 15.4  HCT 46.3  PLT 106*  MCV 89.7  MCH 29.8  MCHC 33.3  RDW 13.4  LYMPHSABS 0.5*  MONOABS 0.2  EOSABS 0.0  BASOSABS 0.0   ------------------------------------------------------------------------------------------------------------------  Chemistries  Recent Labs  Lab 04/17/19 0959  NA 135  K 3.5  CL 100  CO2 25  GLUCOSE 109*  BUN 17  CREATININE 1.16  CALCIUM 8.5*  AST 21  ALT 15  ALKPHOS 68  BILITOT 0.6   ------------------------------------------------------------------------------------------------------------------ estimated creatinine clearance is 54.8 mL/min (by C-G formula based on SCr of 1.16 mg/dL). ------------------------------------------------------------------------------------------------------------------ No results for input(s): TSH, T4TOTAL, T3FREE, THYROIDAB in the last 72 hours.  Invalid input(s): FREET3  Coagulation profile Recent Labs  Lab 04/17/19 0959  INR 1.0   ------------------------------------------------------------------------------------------------------------------- No results for input(s): DDIMER in the last 72 hours. -------------------------------------------------------------------------------------------------------------------  Cardiac Enzymes No results for input(s): CKMB, TROPONINI, MYOGLOBIN in the last 168 hours.  Invalid input(s): CK  ------------------------------------------------------------------------------------------------------------------ No results found for: BNP   ---------------------------------------------------------------------------------------------------------------  Urinalysis    Component Value Date/Time  COLORURINE YELLOW 08/15/2016 1250   APPEARANCEUR CLEAR 08/15/2016 1250   LABSPEC 1.015 08/15/2016 1250   PHURINE 7.0 08/15/2016 1250   GLUCOSEU NEGATIVE 08/15/2016 1250   HGBUR NEGATIVE 08/15/2016 1250   BILIRUBINUR NEGATIVE 08/15/2016 1250   KETONESUR NEGATIVE 08/15/2016 1250   PROTEINUR NEGATIVE 08/15/2016 1250   UROBILINOGEN 0.2 10/22/2013 0820   NITRITE NEGATIVE 08/15/2016 1250   LEUKOCYTESUR NEGATIVE 08/15/2016 1250    ----------------------------------------------------------------------------------------------------------------   Imaging Results:    Dg Chest Port 1 View  Result Date: 04/17/2019 CLINICAL DATA:  Shortness of breath.  Productive cough. EXAM: PORTABLE CHEST 1 VIEW COMPARISON:  05/02/2016. FINDINGS: Mediastinum hilar structures normal. Lungs are clear of acute infiltrates. Chronic mild bibasilar interstitial prominence. No pleural effusion or pneumothorax. Biapical pleural thickening again noted consistent scarring. Heart size normal. No acute bony abnormality. Degenerative change thoracic spine. IMPRESSION: Chronic mild bibasilar interstitial prominence. No acute infiltrates. Electronically Signed   By: Marcello Moores  Register   On: 04/17/2019 10:26    My personal review of EKG: Rhythm NSR, Rate  89 /min, QTc 421 , no Acute ST changes   Assessment & Plan:    Active Problems:   Hypertension   Hyperlipidemia   CAD (coronary artery disease)   COPD (chronic obstructive pulmonary disease) ([place])   Dyspepsia   COVID-19   COVID 19 infection -Patient presents with fever, cough and shortness of breath, COVID-19 positive, is with progressive dyspnea, but fortunately he is not  hypoxic, with no opacities on chest x-ray. -Inflammatory markers including CRP, ferritin, D-dimers, and will trend daily. -He had minimal wheezing, I will start him on Solu-Medrol. -Will be admitted to [place] for observation. -No hypoxia or or opacity on imaging, no indication for Actemra or Remdesivir currently -We will start vitamin C and zinc  Hypertension -Shunt was mildly orthostatic on presentation, will hold medications for now  COPD -Minimal wheezing, but no hypoxia, continue with albuterol inhaler, will start on steroids  GERD -Continue with PPI  History of CAD -He denies any chest pain, continue with aspirin, statin    DVT Prophylaxis   Lovenox - SCDs  AM Labs Ordered, also please review Full Orders  Family Communication: Admission, patients condition and plan of care including tests being ordered have been discussed with the patient  who indicate understanding and agree with the plan and Code Status.  Code Status Full  Likely DC to  Home  Condition GUARDED    Consults called: None  Admission status:  Observation  Time spent in minutes : 55 minutes   Phillips Climes M.D on 04/17/2019 at 2:15 PM  Between 7am to 7pm - Pager - [phone]. After 7pm go to [email or web address] - password [facility]  [facility] - Office  [phone]

## 2019-04-17 NOTE — ED Notes (Signed)
Hospitalist at bedside 

## 2019-04-17 NOTE — Plan of Care (Signed)
  Problem: Education: Goal: Knowledge of risk factors and measures for prevention of condition will improve Outcome: Progressing   Problem: Coping: Goal: Psychosocial and spiritual needs will be supported Outcome: Progressing   Problem: Respiratory: Goal: Will maintain a patent airway Outcome: Progressing Goal: Complications related to the disease process, condition or treatment will be avoided or minimized Outcome: Progressing   

## 2019-04-17 NOTE — ED Notes (Signed)
Pt ambulatory in room, O2 sats range 94-96% on room air. Pt denies SOB

## 2019-04-17 NOTE — ED Notes (Signed)
Pt transported off unit by [facility].

## 2019-04-17 NOTE — ED Notes (Signed)
Date and time results received: 04/17/19 1215 (use smartphrase ".now" to insert current time)  Test: Covid Critical Value: positive  Name of Provider Notified: Thurnell Garbe  Orders Received? Or Actions Taken?: Orders Received - See Orders for details

## 2019-04-17 NOTE — ED Notes (Signed)
Pt given dinner tray.

## 2019-04-17 NOTE — ED Triage Notes (Signed)
Pt sent over by UC for possible COVID. Pt had a COVID exposure about 10 days ago. Pt c/o increased SOB, productive cough, generalized weakness, chills, loss of taste, fever, & malaise. UC reports pt had fever of 101.7 and sats were 92% on RA.

## 2019-04-17 NOTE — ED Notes (Signed)
Date and time results received: 04/17/19 1019 (use smartphrase ".now" to insert current time)  Test: lactic acid Critical Value: 2.1  Name of Provider Notified: Dr. Thurnell Garbe  Orders Received? Or Actions Taken?: n/a

## 2019-04-17 NOTE — ED Notes (Signed)
ED TO INPATIENT HANDOFF REPORT  ED Nurse Name and Phone #: [phone]  S Name/Age/Gender [name] 83 y.o. male Room/Bed: APA10/APA10  Code Status   Code Status: Prior  Home/SNF/Other Home Patient oriented to: self, place, time and situation Is this baseline? Yes   Triage Complete: Triage complete  Chief Complaint covid  Triage Note Pt sent over by UC for possible COVID. Pt had a COVID exposure about 10 days ago. Pt c/o increased SOB, productive cough, generalized weakness, chills, loss of taste, fever, & malaise. UC reports pt had fever of 101.7 and sats were 92% on RA.   Allergies No Known Allergies  Level of Care/Admitting Diagnosis ED Disposition    ED Disposition Condition [facility] Area: [place] [100101]  Level of Care: Med-Surg [16]  Covid Evaluation: Confirmed COVID Positive  Diagnosis: COVID-19 [[id number]]  Admitting Physician: Manfred Shirts  Attending Physician: Waldron Labs, DAWOOD S [[id number]  PT Class (Do Not Modify): Observation [104]  PT Acc Code (Do Not Modify): Observation [10022]       B Medical/Surgery History Past Medical History:  Diagnosis Date  . Abnormal CXR 4/20011   Chest  CT mode/serere COPD ( no mediastinal abnormality)  . CAD (coronary artery disease) 2005   anterior MI with stent to the LAD in 1991 / stent 1996 / nuclear 2005, no ischemia  . Carotid bruit    Doppler, June, 2013, 0-39% bilateral  . COPD (chronic obstructive pulmonary disease) ([place])   . Diverticula of colon   . Ejection fraction    55-60%, echo, April, 2011, could not estimate RV pressure  . Esophagitis   . HOH (hard of hearing)   . Hyperlipidemia   . Hypertension   . Myocardial infarction ([place])   . Shortness of breath    with exertion  . Sleep apnea    Stop Bang score of 5  . Tobacco abuse    Past Surgical History:  Procedure Laterality Date  . APPENDECTOMY    . BIOPSY  11/17/2017   Procedure: BIOPSY;   Surgeon: Rogene Houston, MD;  [facility];  Service: Endoscopy;;  gastric  . CARDIAC CATHETERIZATION    . CATARACT EXTRACTION Left   . CATARACT EXTRACTION W/PHACO Right 01/22/2014   Procedure: CATARACT EXTRACTION PHACO AND INTRAOCULAR LENS PLACEMENT (IOC);  Surgeon: Tonny Branch, MD;  Location: [facility];  Service: Ophthalmology;  Laterality: Right;  CDE 7.46  . CHOLECYSTECTOMY     8 years ago [facility]  . COLONOSCOPY  10/05/2012   Dr. Laural Golden: multiple diverticula at sigmoid colon, multiple polyps (tubular adenomas), small external hemorrhoids  . COLONOSCOPY N/A 11/18/2017   Procedure: COLONOSCOPY;  Surgeon: Rogene Houston, MD;  [facility];  Service: Endoscopy;  Laterality: N/A;  . CYST REMOVAL HAND     right wrist  . ESOPHAGEAL DILATION N/A 05/23/2015   Procedure: ESOPHAGEAL DILATION;  Surgeon: Daneil Dolin, MD;  [facility];  Service: Endoscopy;  Laterality: N/A;  . ESOPHAGEAL DILATION N/A 11/17/2017   Procedure: ESOPHAGEAL DILATION;  Surgeon: Rogene Houston, MD;  [facility];  Service: Endoscopy;  Laterality: N/A;  . ESOPHAGOGASTRODUODENOSCOPY N/A 05/23/2015   Dr. Gala Romney: Ulcerative/ erosive reflux esophagitis with peptic stricture formation. Status post dilation as described. Hiatal hernia. Abnormal gastric mucosa of uncertain significance status post gastric biospy. +H.pylori  . ESOPHAGOGASTRODUODENOSCOPY N/A 11/17/2017   Procedure: ESOPHAGOGASTRODUODENOSCOPY (EGD);  Surgeon: Rogene Houston, MD;  [facility];  Service: Endoscopy;  Laterality: N/A;  . PERCUTANEOUS STENT INTERVENTION    . SKIN CANCER EXCISION  2015   ear & neck  . TONSILLECTOMY       A IV Location/Drains/Wounds Patient Lines/Drains/Airways Status   Active Line/Drains/Airways    Name:   Placement date:   Placement time:   Site:   Days:   Peripheral IV 04/17/19 Right Forearm   04/17/19    1000    Forearm   less than 1   Peripheral IV 04/17/19 Left Forearm   04/17/19     0950    Forearm   less than 1          Intake/Output Last 24 hours  Intake/Output Summary (Last 24 hours) at 04/17/2019 1453 Last data filed at 04/17/2019 1313 Gross per 24 hour  Intake 500 ml  Output -  Net 500 ml    Labs/Imaging Results for orders placed or performed during the hospital encounter of 04/17/19 (from the past 48 hour(s))  Lactic acid, plasma     Status: Abnormal   Collection Time: 04/17/19  9:59 AM  Result Value Ref Range   Lactic Acid, Venous 2.1 (HH) 0.5 - 1.9 mmol/L    Comment: CRITICAL RESULT CALLED TO, READ BACK BY AND VERIFIED WITH: MOORE,M AT 10:20AM ON 04/17/19 BY [facility] Performed at [facility], [address] [place], [place] [address]   Comprehensive metabolic panel     Status: Abnormal   Collection Time: 04/17/19  9:59 AM  Result Value Ref Range   Sodium 135 135 - 145 mmol/L   Potassium 3.5 3.5 - 5.1 mmol/L   Chloride 100 98 - 111 mmol/L   CO2 25 22 - 32 mmol/L   Glucose, Bld 109 (H) 70 - 99 mg/dL   BUN 17 8 - 23 mg/dL   Creatinine, Ser 1.16 0.61 - 1.24 mg/dL   Calcium 8.5 (L) 8.9 - 10.3 mg/dL   Total Protein 6.5 6.5 - 8.1 g/dL   Albumin 3.6 3.5 - 5.0 g/dL   AST 21 15 - 41 U/L   ALT 15 0 - 44 U/L   Alkaline Phosphatase 68 38 - 126 U/L   Total Bilirubin 0.6 0.3 - 1.2 mg/dL   GFR calc non Af Amer 59 (L) >60 mL/min   GFR calc Af Amer >60 >60 mL/min   Anion gap 10 5 - 15    Comment: Performed at [facility], [address], [place], [place] [address]  CBC WITH DIFFERENTIAL     Status: Abnormal   Collection Time: 04/17/19  9:59 AM  Result Value Ref Range   WBC 2.1 (L) 4.0 - 10.5 K/uL   RBC 5.16 4.22 - 5.81 MIL/uL   Hemoglobin 15.4 13.0 - 17.0 g/dL   HCT 46.3 39.0 - 52.0 %   MCV 89.7 80.0 - 100.0 fL   MCH 29.8 26.0 - 34.0 pg   MCHC 33.3 30.0 - 36.0 g/dL   RDW 13.4 11.5 - 15.5 %   Platelets 106 (L) 150 - 400 K/uL    Comment: PLATELET COUNT CONFIRMED BY SMEAR SPECIMEN CHECKED FOR CLOTS Immature Platelet Fraction may be clinically  indicated, consider ordering this additional test [id number]    nRBC 0.0 0.0 - 0.2 %   Neutrophils Relative % 68 %   Neutro Abs 1.4 (L) 1.7 - 7.7 K/uL   Lymphocytes Relative 22 %   Lymphs Abs 0.5 (L) 0.7 - 4.0 K/uL   Monocytes Relative 7 %  Monocytes Absolute 0.2 0.1 - 1.0 K/uL   Eosinophils Relative 1 %   Eosinophils Absolute 0.0 0.0 - 0.5 K/uL   Basophils Relative 1 %   Basophils Absolute 0.0 0.0 - 0.1 K/uL   Immature Granulocytes 1 %   Abs Immature Granulocytes 0.01 0.00 - 0.07 K/uL    Comment: Performed at [facility], [address], [place], [place] [address]  APTT     Status: None   Collection Time: 04/17/19  9:59 AM  Result Value Ref Range   aPTT 33 24 - 36 seconds    Comment: Performed at [facility], [address], [place], [place] [address]  Protime-INR     Status: None   Collection Time: 04/17/19  9:59 AM  Result Value Ref Range   Prothrombin Time 12.8 11.4 - 15.2 seconds   INR 1.0 0.8 - 1.2    Comment: (NOTE) INR goal varies based on device and disease states. Performed at [facility], [address], [place], [place] [address]   Blood Culture (routine x 2)     Status: None (Preliminary result)   Collection Time: 04/17/19  9:59 AM   Specimen: BLOOD LEFT FOREARM  Result Value Ref Range   Specimen Description BLOOD LEFT FOREARM    Special Requests      BOTTLES DRAWN AEROBIC AND ANAEROBIC Blood Culture adequate volume Performed at [facility], [address], [place], [place] [address]    Culture PENDING    Report Status PENDING   Troponin I (High Sensitivity)     Status: None   Collection Time: 04/17/19  9:59 AM  Result Value Ref Range   Troponin I (High Sensitivity) 4 <18 ng/L    Comment: (NOTE) Elevated high sensitivity troponin I (hsTnI) values and significant  changes across serial measurements may suggest ACS but many other  chronic and acute conditions are known to elevate hsTnI results.  Refer to the "Links" section for chest pain algorithms and  additional  guidance. Performed at [facility], [address] [place], [place] [address]   Blood Culture (routine x 2)     Status: None (Preliminary result)   Collection Time: 04/17/19 10:10 AM   Specimen: BLOOD RIGHT FOREARM  Result Value Ref Range   Specimen Description BLOOD RIGHT FOREARM    Special Requests      BOTTLES DRAWN AEROBIC AND ANAEROBIC Blood Culture adequate volume Performed at [facility], [address] [place],  [address]    Culture PENDING    Report Status PENDING   SARS Coronavirus 2 [facility] order, Performed in [place] hospital lab)     Status: Abnormal   Collection Time: 04/17/19 10:19 AM  Result Value Ref Range   SARS Coronavirus 2 POSITIVE (A) NEGATIVE    Comment: RESULT CALLED TO, READ BACK BY AND VERIFIED WITH: CREWS M. AT 1207P ON [id number] BY THOMPSON S. Performed at [facility], [address] [place],  [address]   Urinalysis, Routine w reflex microscopic     Status: Abnormal   Collection Time: 04/17/19  1:11 PM  Result Value Ref Range   Color, Urine AMBER (A) YELLOW    Comment: BIOCHEMICALS MAY BE AFFECTED BY COLOR   APPearance HAZY (A) CLEAR   Specific Gravity, Urine 1.027 1.005 - 1.030   pH 5.0 5.0 - 8.0   Glucose, UA NEGATIVE NEGATIVE mg/dL   Hgb urine dipstick NEGATIVE NEGATIVE   Bilirubin Urine NEGATIVE NEGATIVE   Ketones, ur NEGATIVE NEGATIVE mg/dL   Protein,  ur NEGATIVE NEGATIVE mg/dL   Nitrite NEGATIVE NEGATIVE   Leukocytes,Ua NEGATIVE NEGATIVE    Comment: Performed at [facility], [address], [place], [place] [address]  ABO/Rh     Status: None   Collection Time: 04/17/19  2:14 PM  Result Value Ref Range   ABO/RH(D)      Jenetta Downer NEG Performed at [facility], [address] [place], [place] [address]    Dg Chest Port 1 View  Result Date: 04/17/2019 CLINICAL DATA:  Shortness of breath.  Productive cough. EXAM: PORTABLE CHEST 1 VIEW COMPARISON:  05/02/2016. FINDINGS: Mediastinum hilar structures normal. Lungs are  clear of acute infiltrates. Chronic mild bibasilar interstitial prominence. No pleural effusion or pneumothorax. Biapical pleural thickening again noted consistent scarring. Heart size normal. No acute bony abnormality. Degenerative change thoracic spine. IMPRESSION: Chronic mild bibasilar interstitial prominence. No acute infiltrates. Electronically Signed   By: Marcello Moores  Register   On: 04/17/2019 10:26    Pending Labs Unresulted Labs (From admission, onward)    Start     Ordered   04/17/19 1324  Ferritin  Once,   STAT     04/17/19 1324   04/17/19 1324  D-dimer, quantitative (not at [facility])  Once,   STAT     04/17/19 1324   04/17/19 1324  C-reactive protein  Once,   STAT     04/17/19 1324   04/17/19 1324  Brain natriuretic peptide  Once,   STAT     04/17/19 1324   04/17/19 0955  Lactic acid, plasma  Now then every 2 hours,   STAT     04/17/19 0955   04/17/19 0955  Urine culture  ONCE - STAT,   STAT     04/17/19 0955   04/17/19 0953  SARS Coronavirus 2 [facility] order, Performed in [facility] hospital lab) Nasopharyngeal Nasopharyngeal Swab  (Symptomatic/High Risk of Exposure Patients Labs with Precautions)  Once,   STAT    Question Answer Comment  Is this test for diagnosis or screening Diagnosis of ill patient   Symptomatic for COVID-19 as defined by CDC Yes   Date of Symptom Onset 04/06/2019   Hospitalized for COVID-19 No   Admitted to ICU for COVID-19 No   Previously tested for COVID-19 No   Resident in a congregate (group) care setting No   Employed in healthcare setting No      04/17/19 0954   Signed and Held  ABO/Rh  Once,   R     Signed and Held   Signed and Held  CBC  (enoxaparin (LOVENOX)    CrCl >/= 30 ml/min)  Once,   R    Comments: Baseline for enoxaparin therapy IF NOT ALREADY DRAWN.  Notify MD if PLT < 100 K.    Signed and Held   Signed and Held  Creatinine, serum  (enoxaparin (LOVENOX)    CrCl >/= 30 ml/min)  Once,   R    Comments: Baseline for enoxaparin therapy IF  NOT ALREADY DRAWN.    Signed and Held   Signed and Held  Creatinine, serum  (enoxaparin (LOVENOX)    CrCl >/= 30 ml/min)  Weekly,   R    Comments: while on enoxaparin therapy    Signed and Held   Signed and Held  CBC with Differential/Platelet  Daily,   R     Signed and Held   Signed and Held  Comprehensive metabolic panel  Daily,   R     Signed and Held  Signed and Held  C-reactive protein  Daily,   R     Signed and Held   Signed and Held  D-dimer, quantitative (not at [facility])  Daily,   R     Signed and Held   Signed and Held  Ferritin  Daily,   R     Signed and Held          Vitals/Pain Today's Vitals   04/17/19 1300 04/17/19 1330 04/17/19 1400 04/17/19 1430  BP: 110/83 128/69 122/72 133/71  Pulse:  71 75 74  Resp:  (!) 30 (!) 26 (!) 32  Temp:      TempSrc:      SpO2:  95% 96% 92%  Weight:      Height:      PainSc:        Isolation Precautions Airborne and Contact precautions  Medications Medications  methylPREDNISolone sodium succinate (SOLU-MEDROL) 125 mg/2 mL injection 80 mg (80 mg Intravenous Given 04/17/19 1404)  famotidine (PEPCID) tablet 20 mg (20 mg Oral Given 04/17/19 1404)  sodium chloride 0.9 % bolus 500 mL (0 mLs Intravenous Stopped 04/17/19 1313)    Mobility walks Low fall risk   Focused Assessments    R Recommendations: See Admitting Provider Note  Report given to:   Additional Notes:

## 2019-04-17 NOTE — ED Notes (Signed)
Spoke with pt's wife and updated her on his condition.

## 2019-04-18 DIAGNOSIS — I2510 Atherosclerotic heart disease of native coronary artery without angina pectoris: Secondary | ICD-10-CM | POA: Diagnosis present

## 2019-04-18 DIAGNOSIS — J449 Chronic obstructive pulmonary disease, unspecified: Secondary | ICD-10-CM | POA: Diagnosis present

## 2019-04-18 DIAGNOSIS — Z9049 Acquired absence of other specified parts of digestive tract: Secondary | ICD-10-CM | POA: Diagnosis not present

## 2019-04-18 DIAGNOSIS — H9190 Unspecified hearing loss, unspecified ear: Secondary | ICD-10-CM | POA: Diagnosis present

## 2019-04-18 DIAGNOSIS — Z9842 Cataract extraction status, left eye: Secondary | ICD-10-CM | POA: Diagnosis not present

## 2019-04-18 DIAGNOSIS — K5730 Diverticulosis of large intestine without perforation or abscess without bleeding: Secondary | ICD-10-CM | POA: Diagnosis present

## 2019-04-18 DIAGNOSIS — Z720 Tobacco use: Secondary | ICD-10-CM | POA: Diagnosis not present

## 2019-04-18 DIAGNOSIS — I10 Essential (primary) hypertension: Secondary | ICD-10-CM

## 2019-04-18 DIAGNOSIS — G4730 Sleep apnea, unspecified: Secondary | ICD-10-CM | POA: Diagnosis present

## 2019-04-18 DIAGNOSIS — Z79899 Other long term (current) drug therapy: Secondary | ICD-10-CM | POA: Diagnosis not present

## 2019-04-18 DIAGNOSIS — R1013 Epigastric pain: Secondary | ICD-10-CM | POA: Diagnosis not present

## 2019-04-18 DIAGNOSIS — U071 COVID-19: Secondary | ICD-10-CM | POA: Diagnosis present

## 2019-04-18 DIAGNOSIS — Z8249 Family history of ischemic heart disease and other diseases of the circulatory system: Secondary | ICD-10-CM | POA: Diagnosis not present

## 2019-04-18 DIAGNOSIS — I252 Old myocardial infarction: Secondary | ICD-10-CM | POA: Diagnosis not present

## 2019-04-18 DIAGNOSIS — Z800 Family history of malignant neoplasm of digestive organs: Secondary | ICD-10-CM | POA: Diagnosis not present

## 2019-04-18 DIAGNOSIS — Z7982 Long term (current) use of aspirin: Secondary | ICD-10-CM | POA: Diagnosis not present

## 2019-04-18 DIAGNOSIS — Z961 Presence of intraocular lens: Secondary | ICD-10-CM | POA: Diagnosis present

## 2019-04-18 DIAGNOSIS — Z9841 Cataract extraction status, right eye: Secondary | ICD-10-CM | POA: Diagnosis not present

## 2019-04-18 DIAGNOSIS — K219 Gastro-esophageal reflux disease without esophagitis: Secondary | ICD-10-CM | POA: Diagnosis present

## 2019-04-18 DIAGNOSIS — E785 Hyperlipidemia, unspecified: Secondary | ICD-10-CM | POA: Diagnosis present

## 2019-04-18 DIAGNOSIS — E860 Dehydration: Secondary | ICD-10-CM | POA: Diagnosis present

## 2019-04-18 LAB — COMPREHENSIVE METABOLIC PANEL
ALT: 13 U/L (ref 0–44)
AST: 18 U/L (ref 15–41)
Albumin: 3.1 g/dL — ABNORMAL LOW (ref 3.5–5.0)
Alkaline Phosphatase: 59 U/L (ref 38–126)
Anion gap: 8 (ref 5–15)
BUN: 18 mg/dL (ref 8–23)
CO2: 27 mmol/L (ref 22–32)
Calcium: 8.3 mg/dL — ABNORMAL LOW (ref 8.9–10.3)
Chloride: 102 mmol/L (ref 98–111)
Creatinine, Ser: 0.92 mg/dL (ref 0.61–1.24)
GFR calc Af Amer: >60 mL/min (ref >60)
GFR calc non Af Amer: >60 mL/min (ref >60)
Glucose, Bld: 134 mg/dL — ABNORMAL HIGH (ref 70–99)
Potassium: 4.1 mmol/L (ref 3.5–5.1)
Sodium: 137 mmol/L (ref 135–145)
Total Bilirubin: 0.5 mg/dL (ref 0.3–1.2)
Total Protein: 6 g/dL — ABNORMAL LOW (ref 6.5–8.1)

## 2019-04-18 LAB — CBC WITH DIFFERENTIAL/PLATELET
Abs Immature Granulocytes: 0.01 10*3/uL (ref 0.00–0.07)
Basophils Absolute: 0 10*3/uL (ref 0.0–0.1)
Basophils Relative: 0 %
Eosinophils Absolute: 0 10*3/uL (ref 0.0–0.5)
Eosinophils Relative: 0 %
HCT: 41.6 % (ref 39.0–52.0)
Hemoglobin: 14.2 g/dL (ref 13.0–17.0)
Immature Granulocytes: 1 %
Lymphocytes Relative: 21 %
Lymphs Abs: 0.3 10*3/uL — ABNORMAL LOW (ref 0.7–4.0)
MCH: 30.5 pg (ref 26.0–34.0)
MCHC: 34.1 g/dL (ref 30.0–36.0)
MCV: 89.3 fL (ref 80.0–100.0)
Monocytes Absolute: 0.1 10*3/uL (ref 0.1–1.0)
Monocytes Relative: 7 %
Neutro Abs: 1.1 10*3/uL — ABNORMAL LOW (ref 1.7–7.7)
Neutrophils Relative %: 71 %
Platelets: 115 10*3/uL — ABNORMAL LOW (ref 150–400)
RBC: 4.66 MIL/uL (ref 4.22–5.81)
RDW: 13.3 % (ref 11.5–15.5)
WBC: 1.5 10*3/uL — ABNORMAL LOW (ref 4.0–10.5)
nRBC: 0 % (ref 0.0–0.2)

## 2019-04-18 LAB — FERRITIN: Ferritin: 200 ng/mL (ref 24–336)

## 2019-04-18 LAB — ABO/RH: ABO/RH(D): O NEG

## 2019-04-18 LAB — URINE CULTURE: Culture: NO GROWTH

## 2019-04-18 LAB — C-REACTIVE PROTEIN: CRP: 3.3 mg/dL — ABNORMAL HIGH (ref <1.0)

## 2019-04-18 LAB — D-DIMER, QUANTITATIVE: D-Dimer, Quant: 0.61 ug/mL-FEU — ABNORMAL HIGH (ref 0.00–0.50)

## 2019-04-18 MED ORDER — DEXAMETHASONE 6 MG PO TABS
6.0000 mg | ORAL_TABLET | Freq: Every day | ORAL | Status: DC
  Administered 2019-04-18 – 2019-04-19 (×2): 6 mg via ORAL
  Filled 2019-04-18 (×2): qty 1

## 2019-04-18 MED ORDER — ALBUTEROL SULFATE HFA 108 (90 BASE) MCG/ACT IN AERS
1.0000 | INHALATION_SPRAY | RESPIRATORY_TRACT | Status: DC | PRN
  Filled 2019-04-18: qty 6.7

## 2019-04-18 MED ORDER — ACETAMINOPHEN 325 MG PO TABS: 650.0000 mg | ORAL_TABLET | Freq: Four times a day (QID) | ORAL | Status: DC | PRN

## 2019-04-18 NOTE — Evaluation (Signed)
Occupational Therapy Evaluation Patient Details Name: Jeremy [name] MRN: [id number] DOB: 19-Apr-1936 Today's Date: 04/18/2019    History of Present Illness 83 yo male presents with dyspnea, patient complains of multiple symptoms including generalized weakness, fatigue for the last 2 weeks. Tested COVID positive. PMH including CAD, COPD on room air, GERD, hyperlipidemia, hypertension, and GI bleed last year.   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 Pt currently requiring ADLs at supervision level and functional mobility with Min Guard-Min A. Pt near baseline functional with mild balance deficits. Discussed with pt use of shower seat for bathing and to take seated rest breaks as needed throughout the day. Pt would benefit from further acute OT to facilitate safe dc. Recommend dc to home once medically stable per physician.      Follow Up Recommendations  No OT follow up    Equipment Recommendations  None recommended by OT    Recommendations for Other Services       Precautions / Restrictions        Mobility Bed Mobility Overal bed mobility: Independent                Transfers Overall transfer level: Needs assistance Equipment used: None Transfers: Sit to/from Stand Sit to Stand: Supervision         General transfer comment: supervision for safety    Balance Overall balance assessment: Mild deficits observed, not formally tested                                         ADL either performed or assessed with clinical judgement   ADL Overall ADL's : Needs assistance/impaired Eating/Feeding: Independent;Sitting   Grooming: Oral care;Set up;Supervision/safety;Standing   Upper Body Bathing: Independent;Sitting   Lower Body Bathing: Supervison/ safety;Sit to/from stand   Upper Body Dressing : Independent;Sitting Upper Body Dressing Details (indicate cue type and reason):  Donning second gown like a jacket Lower Body Dressing: Supervision/safety;Sit to/from stand   Toilet Transfer: Supervision/safety;Ambulation(simulated to recliner)           Functional mobility during ADLs: Min guard;Minimal assistance General ADL Comments: Pt with slight balance deficits benefiting from single UE support. Pt also attepmting to lean closer to therapist to hear which caused him to stagger      Vision         Perception     Praxis      Pertinent Vitals/Pain Pain Assessment: No/denies pain     Hand Dominance     Extremity/Trunk Assessment Upper Extremity Assessment Upper Extremity Assessment: Overall WFL for tasks assessed   Lower Extremity Assessment Lower Extremity Assessment: Overall WFL for tasks assessed   Cervical / Trunk Assessment Cervical / Trunk Assessment: Normal   Communication Communication Communication: HOH   Cognition Arousal/Alertness: Awake/alert Behavior During Therapy: WFL for tasks assessed/performed Overall Cognitive Status: Within Functional Limits for tasks assessed                                 General Comments: Having to repeat because of HOH   General Comments  SpO2 >91% on RA throughout    Exercises     Shoulder Instructions      Home Living Family/patient expects to be discharged to:: Private residence Living Arrangements: Spouse/significant  other Available Help at Discharge: Family;Available 24 hours/day Type of Home: House Home Access: Stairs to enter [employer] of Steps: 4 Entrance Stairs-Rails: Can reach both Home Layout: One level     Bathroom Shower/Tub: Teacher, early years/pre: Standard     Home Equipment: Marine scientist - single point          Prior Functioning/Environment Level of Independence: Independent        Comments: Pt performed all ADLs, IADLs, 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        OT Problem List: Decreased activity tolerance;Impaired balance  (sitting and/or standing);Decreased knowledge of precautions;Decreased knowledge of use of DME or AE      OT Treatment/Interventions: Self-care/ADL training;Therapeutic exercise;Energy conservation;DME and/or AE instruction;Patient/family education;Therapeutic activities    OT Goals(Current goals can be found in the care plan section) Acute Rehab OT Goals Patient Stated Goal: "Go home" OT Goal Formulation: With patient Time For Goal Achievement: 05/02/19 Potential to Achieve Goals: Good  OT Frequency: Min 2X/week   Barriers to D/C:            Co-evaluation              AM-PAC OT "6 Clicks" Daily Activity     Outcome Measure Help from another person eating meals?: None Help from another person taking care of personal grooming?: A Little Help from another person toileting, which includes using toliet, bedpan, or urinal?: A Little Help from another person bathing (including washing, rinsing, drying)?: A Little Help from another person to put on and taking off regular upper body clothing?: None Help from another person to put on and taking off regular lower body clothing?: A Little 6 Click Score: 20   End of Session Nurse Communication: Mobility status  Activity Tolerance: Patient tolerated treatment well Patient left: in chair;with call bell/phone within reach  OT Visit Diagnosis: Unsteadiness on feet (R26.81);Muscle weakness (generalized) (M62.81)                Time: 3817-7116 OT Time Calculation (min): 21 min Charges:  OT General Charges $OT Visit: 1 Visit OT Evaluation $OT Eval Low Complexity: 1 Low  Elonda Giuliano MSOT, OTR/L Acute Rehab Pager: [phone] Office: [place] 04/18/2019, 2:47 PM

## 2019-04-18 NOTE — Progress Notes (Signed)
Called pt son on arrival. Pt would like to walk in hallway once he eat breakfast.

## 2019-04-18 NOTE — Plan of Care (Signed)
  Problem: Education: Goal: Knowledge of risk factors and measures for prevention of condition will improve Outcome: Progressing   Problem: Coping: Goal: Psychosocial and spiritual needs will be supported Outcome: Progressing   Problem: Respiratory: Goal: Will maintain a patent airway Outcome: Progressing Goal: Complications related to the disease process, condition or treatment will be avoided or minimized Outcome: Progressing   Problem: Education: Goal: Knowledge of General Education information will improve Description: Including pain rating scale, medication(s)/side effects and non-pharmacologic comfort measures Outcome: Progressing   Problem: Clinical Measurements: Goal: Ability to maintain clinical measurements within normal limits will improve Outcome: Progressing Goal: Will remain free from infection Outcome: Progressing Goal: Diagnostic test results will improve Outcome: Progressing Goal: Respiratory complications will improve Outcome: Progressing Goal: Cardiovascular complication will be avoided Outcome: Progressing   Problem: Activity: Goal: Risk for activity intolerance will decrease Outcome: Progressing   Problem: Nutrition: Goal: Adequate nutrition will be maintained Outcome: Progressing   Problem: Coping: Goal: Level of anxiety will decrease Outcome: Progressing   Problem: Elimination: Goal: Will not experience complications related to bowel motility Outcome: Progressing Goal: Will not experience complications related to urinary retention Outcome: Progressing   Problem: Pain Managment: Goal: General experience of comfort will improve Outcome: Progressing   Problem: Safety: Goal: Ability to remain free from injury will improve Outcome: Progressing   Problem: Skin Integrity: Goal: Risk for impaired skin integrity will decrease Outcome: Progressing

## 2019-04-18 NOTE — Progress Notes (Addendum)
[place] TEAM 1 - Stepdown/ICU TEAM  Jeremy [name]  MEQ:[id number] DOB: 27-Feb-1936 DOA: 04/17/2019 PCP: Jani Gravel, MD    Brief Narrative:  83 year old with a history of CAD, COPD, GERD, HLD, HTN, and GIB who presented with 2 weeks of worsening generalized weakness and fatigue.  He reported a known COVID exposure 10 days prior.  He decided to come to the emergency room when he began to develop shortness of breath which persisted over a 3-day period.  In the ED a chest x-ray revealed no evidence of infiltrate and the patient was not hypoxic.  He was however noted to be tachypneic and significantly dehydrated.  Significant Events: 8/3 admit to [facility] - transfered to the [facility]  COVID-19 specific Treatment: Solu-Medrol 8/3 >  Subjective: Resting comfortably in bed.  Patient states that he is feeling much better in general.  He is currently not short of breath.  He denies headache, dizziness, nausea vomiting, or abdominal pain.  He is very hard of hearing which makes communication difficult.  Assessment & Plan:  SARS-CoV-2 positive Not hypoxic with no acute infiltrates on chest x-ray  - is not presently a candidate for Remdesivir or Actemra - continue steroids only for now -monitor closely on IV steroids as the patient is at high risk for possible acute severe respiratory decline -will repeat chest x-ray in a.m. to assure significant infiltrate has not developed status post volume resuscitation  Recent Labs    04/17/19 1414 04/18/19 0155  DDIMER 0.39 0.61*  FERRITIN 170 200  CRP 3.8* 3.3*    Lymphopenia Likely a manifestation of COVID - WBC and lymphocyte count March 2019 was normal - follow-up in a.m. - this is a worrisome potential indicator of progressive disease   Orthostasis / dehydration Due to an acute viral infection as noted above -corrected with IV fluid resuscitation  HTN Blood pressure presently reasonably well controlled  COPD Well compensated at this time  GERD  Continue home medical therapy  CAD Asymptomatic  DVT prophylaxis: Lovenox Code Status: FULL CODE Family Communication:  Disposition Plan:   Consultants:  none  Antimicrobials:  None  Objective: Blood pressure 131/86, pulse 69, temperature 98.4 F (36.9 C), resp. rate 18, height 6' (1.829 m), weight 76.3 kg, SpO2 95 %.  Intake/Output Summary (Last 24 hours) at 04/18/2019 0948 Last data filed at 04/17/2019 1313 Gross per 24 hour  Intake 500 ml  Output -  Net 500 ml   Filed Weights   04/17/19 0933 04/17/19 2233  Weight: 81.6 kg 76.3 kg    Examination: General: No acute respiratory distress  Lungs: Clear to auscultation bilaterally without wheezes or crackles Cardiovascular: Regular rate and rhythm without murmur gallop or rub normal S1 and S2 Abdomen: Nontender, nondistended, soft, bowel sounds positive, no rebound, no ascites, no appreciable mass Extremities: No significant cyanosis, clubbing, or edema bilateral lower extremities  CBC: Recent Labs  Lab 04/17/19 0959 04/18/19 0155  WBC 2.1* 1.5*  NEUTROABS 1.4* 1.1*  HGB 15.4 14.2  HCT 46.3 41.6  MCV 89.7 89.3  PLT 106* 297*   Basic Metabolic Panel: Recent Labs  Lab 04/17/19 0959 04/18/19 0155  NA 135 137  K 3.5 4.1  CL 100 102  CO2 25 27  GLUCOSE 109* 134*  BUN 17 18  CREATININE 1.16 0.92  CALCIUM 8.5* 8.3*   GFR: Estimated Creatinine Clearance: 68 mL/min (by C-G formula based on SCr of 0.92 mg/dL).  Liver Function Tests: Recent Labs  Lab 04/17/19 0959 04/18/19  0155  AST 21 18  ALT 15 13  ALKPHOS 68 59  BILITOT 0.6 0.5  PROT 6.5 6.0*  ALBUMIN 3.6 3.1*   Coagulation Profile: Recent Labs  Lab 04/17/19 0959  INR 1.0    HbA1C: Hgb A1c MFr Bld  Date/Time Value Ref Range Status  02/23/2017 07:42 AM 5.0 <5.7 % Final    Comment:      For the purpose of screening for the presence of diabetes:   <5.7%       Consistent with the absence of diabetes 5.7-6.4 %   Consistent with increased  risk for diabetes (prediabetes) >=6.5 %     Consistent with diabetes   This assay result is consistent with a decreased risk of diabetes.   Currently, no consensus exists regarding use of hemoglobin A1c for diagnosis of diabetes in children.   According to American Diabetes Association (ADA) guidelines, hemoglobin A1c <7.0% represents optimal control in non-pregnant diabetic patients. Different metrics may apply to specific patient populations. Standards of Medical Care in Diabetes (ADA).       Recent Results (from the past 240 hour(s))  Blood Culture (routine x 2)     Status: None (Preliminary result)   Collection Time: 04/17/19  9:59 AM   Specimen: BLOOD LEFT FOREARM  Result Value Ref Range Status   Specimen Description BLOOD LEFT FOREARM  Final   Special Requests   Final    BOTTLES DRAWN AEROBIC AND ANAEROBIC Blood Culture adequate volume   Culture   Final    NO GROWTH < 24 HOURS Performed at [facility], [address] [place], [place] [address]    Report Status PENDING  Incomplete  Blood Culture (routine x 2)     Status: None (Preliminary result)   Collection Time: 04/17/19 10:10 AM   Specimen: BLOOD RIGHT FOREARM  Result Value Ref Range Status   Specimen Description BLOOD RIGHT FOREARM  Final   Special Requests   Final    BOTTLES DRAWN AEROBIC AND ANAEROBIC Blood Culture adequate volume   Culture   Final    NO GROWTH < 24 HOURS Performed at [facility], [address], [place], [place] [address]    Report Status PENDING  Incomplete  SARS Coronavirus 2 [facility] order, Performed in [place] hospital lab)     Status: Abnormal   Collection Time: 04/17/19 10:19 AM  Result Value Ref Range Status   SARS Coronavirus 2 POSITIVE (A) NEGATIVE Final    Comment: RESULT CALLED TO, READ BACK BY AND VERIFIED WITH: CREWS M. AT 1207P ON [id number] BY THOMPSON S. Performed at [facility], [address], [place], [place] [address]      Scheduled Meds: . aspirin EC  81 mg Oral  QPM  . enoxaparin (LOVENOX) injection  40 mg Subcutaneous Q24H  . famotidine  20 mg Oral BID  . methylPREDNISolone (SOLU-MEDROL) injection  80 mg Intravenous Q8H  . pantoprazole  40 mg Oral Daily  . simvastatin  40 mg Oral QPM     LOS: 0 days   Cherene Altes, MD [facility] Office  [phone] Pager - Text Page per Amion  If 7PM-7AM, please contact night-coverage per Amion 04/18/2019, 9:48 AM

## 2019-04-19 ENCOUNTER — Inpatient Hospital Stay (HOSPITAL_COMMUNITY): Payer: Medicare Other

## 2019-04-19 LAB — CBC WITH DIFFERENTIAL/PLATELET
Abs Immature Granulocytes: 0.06 10*3/uL (ref 0.00–0.07)
Basophils Absolute: 0 10*3/uL (ref 0.0–0.1)
Basophils Relative: 0 %
Eosinophils Absolute: 0 10*3/uL (ref 0.0–0.5)
Eosinophils Relative: 0 %
HCT: 39.5 % (ref 39.0–52.0)
Hemoglobin: 13.7 g/dL (ref 13.0–17.0)
Immature Granulocytes: 1 %
Lymphocytes Relative: 8 %
Lymphs Abs: 0.5 10*3/uL — ABNORMAL LOW (ref 0.7–4.0)
MCH: 30.6 pg (ref 26.0–34.0)
MCHC: 34.7 g/dL (ref 30.0–36.0)
MCV: 88.2 fL (ref 80.0–100.0)
Monocytes Absolute: 0.3 10*3/uL (ref 0.1–1.0)
Monocytes Relative: 4 %
Neutro Abs: 5.8 10*3/uL (ref 1.7–7.7)
Neutrophils Relative %: 87 %
Platelets: 124 10*3/uL — ABNORMAL LOW (ref 150–400)
RBC: 4.48 MIL/uL (ref 4.22–5.81)
RDW: 13 % (ref 11.5–15.5)
WBC: 6.6 10*3/uL (ref 4.0–10.5)
nRBC: 0 % (ref 0.0–0.2)

## 2019-04-19 LAB — COMPREHENSIVE METABOLIC PANEL
ALT: 14 U/L (ref 0–44)
AST: 16 U/L (ref 15–41)
Albumin: 3 g/dL — ABNORMAL LOW (ref 3.5–5.0)
Alkaline Phosphatase: 57 U/L (ref 38–126)
Anion gap: 7 (ref 5–15)
BUN: 28 mg/dL — ABNORMAL HIGH (ref 8–23)
CO2: 27 mmol/L (ref 22–32)
Calcium: 8.3 mg/dL — ABNORMAL LOW (ref 8.9–10.3)
Chloride: 103 mmol/L (ref 98–111)
Creatinine, Ser: 0.85 mg/dL (ref 0.61–1.24)
GFR calc Af Amer: >60 mL/min (ref >60)
GFR calc non Af Amer: >60 mL/min (ref >60)
Glucose, Bld: 134 mg/dL — ABNORMAL HIGH (ref 70–99)
Potassium: 4.1 mmol/L (ref 3.5–5.1)
Sodium: 137 mmol/L (ref 135–145)
Total Bilirubin: 0.4 mg/dL (ref 0.3–1.2)
Total Protein: 5.7 g/dL — ABNORMAL LOW (ref 6.5–8.1)

## 2019-04-19 LAB — FERRITIN: Ferritin: 198 ng/mL (ref 24–336)

## 2019-04-19 LAB — D-DIMER, QUANTITATIVE: D-Dimer, Quant: <0.27 ug/mL-FEU (ref 0.00–0.50)

## 2019-04-19 LAB — C-REACTIVE PROTEIN: CRP: 1 mg/dL — ABNORMAL HIGH (ref <1.0)

## 2019-04-19 LAB — MAGNESIUM: Magnesium: 2.1 mg/dL (ref 1.7–2.4)

## 2019-04-19 MED ORDER — PREDNISONE 5 MG PO TABS: ORAL_TABLET | ORAL | 0 refills | Status: DC

## 2019-04-19 NOTE — Progress Notes (Signed)
Discharge instructions given to patient at this time patient verbalized understanding. Informed him to start prednisone as prescribed. ivs removed at this time and patient transported to private vehicle per wheelchair. No acute distress noted.

## 2019-04-19 NOTE — Evaluation (Signed)
Physical Therapy Evaluation Patient Details Name: Jeremy [name] MRN: [id number] DOB: [birth year] Today's Date: 04/19/2019   History of Present Illness  83 yo male presents with dyspnea, patient complains of multiple symptoms including generalized weakness, fatigue for the last 2 weeks. Tested COVID positive. PMH including CAD, COPD on room air, GERD, hyperlipidemia, hypertension, and GI bleed last year.  Clinical Impression  The patient is mildly unsteady during ambulation. Instructed and provided written HEP  Ready fro Dc.    Follow Up Recommendations No PT follow up    Equipment Recommendations  None recommended by PT    Recommendations for Other Services       Precautions / Restrictions Precautions Precautions: Fall      Mobility  Bed Mobility               General bed mobility comments: oob  Transfers Overall transfer level: Needs assistance   Transfers: Sit to/from Stand Sit to Stand: Supervision            Ambulation/Gait Ambulation/Gait assistance: Min guard Gait Distance (Feet): 400 Feet Assistive device: None(occassional steady  assist by PT,) Gait Pattern/deviations: Step-through pattern;Drifts right/left Gait velocity: decr   General Gait Details: when turning, looking to L/R to hear  Stairs            Wheelchair Mobility    Modified Rankin (Stroke Patients Only)       Balance Overall balance assessment: Mild deficits observed, not formally tested                                           Pertinent Vitals/Pain Pain Assessment: No/denies pain    Home Living Family/patient expects to be discharged to:: Private residence Living Arrangements: Spouse/significant other Available Help at Discharge: Family;Available 24 hours/day Type of Home: House Home Access: Stairs to enter Entrance Stairs-Rails: Can reach both Entrance Stairs-Number of Steps: 4 Home Layout: One level Home Equipment: Shower seat;Cane - single  point      Prior Function Level of Independence: Independent         Comments: Pt performed all ADLs, IADLs, 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     Hand Dominance        Extremity/Trunk Assessment   Upper Extremity Assessment Upper Extremity Assessment: Overall WFL for tasks assessed    Lower Extremity Assessment Lower Extremity Assessment: Generalized weakness    Cervical / Trunk Assessment Cervical / Trunk Assessment: Kyphotic  Communication      Cognition   Behavior During Therapy: WFL for tasks assessed/performed Overall Cognitive Status: Within Functional Limits for tasks assessed                                        General Comments General comments (skin integrity, edema, etc.): lists to sides, reaches  out to steady self    Exercises General Exercises - Lower Extremity Hip ABduction/ADduction: AROM;Both;Standing Hip Flexion/Marching: AROM;Both;5 reps;Standing Toe Raises: AROM;Both;Standing;5 reps Heel Raises: Both;5 reps;Standing;AROM Mini-Sqauts: AROM;Standing;5 reps   Assessment/Plan    PT Assessment Patent does not need any further PT services  PT Problem List         PT Treatment Interventions      PT Goals (Current goals can be found in the Care Plan section)  Acute Rehab PT Goals Patient Stated  Goal: "Go home" PT Goal Formulation: All assessment and education complete, DC therapy    Frequency     Barriers to discharge        Co-evaluation               AM-PAC PT "6 Clicks" Mobility  Outcome Measure Help needed turning from your back to your side while in a flat bed without using bedrails?: None Help needed moving from lying on your back to sitting on the side of a flat bed without using bedrails?: None Help needed moving to and from a bed to a chair (including a wheelchair)?: None Help needed standing up from a chair using your arms (e.g., wheelchair or bedside chair)?: None Help needed to walk in hospital room?: A  Little Help needed climbing 3-5 steps with a railing? : A Little 6 Click Score: 22    End of Session   Activity Tolerance: Patient tolerated treatment well Patient left: in chair Nurse Communication: Mobility status PT Visit Diagnosis: Difficulty in walking, not elsewhere classified (R26.2)    Time: 3838-1840 PT Time Calculation (min) (ACUTE ONLY): 44 min   Charges:   PT Evaluation $PT Eval Low Complexity: 1 Low PT Treatments $Gait Training: 8-22 mins $Therapeutic Exercise: 8-22 mins        [place]  Office [phone]   Claretha Cooper 04/19/2019, 3:07 PM

## 2019-04-19 NOTE — Discharge Summary (Signed)
Jeremy [name] FIE:[id number] DOB: Dec 16, 1935 DOA: 04/17/2019  PCP: Jeremy Gravel, MD  Admit date: 04/17/2019  Discharge date: 04/19/2019  Admitted From: Home  Disposition:  Home   Recommendations for Outpatient Follow-up:   Follow up with PCP in 1-2 weeks  PCP Please obtain BMP/CBC, 2 view CXR in 1week,  (see Discharge instructions)   PCP Please follow up on the following pending results: None   Home Health: None   Equipment/Devices: None  Consultations: None  Discharge Condition: Stable    CODE STATUS: Full    Diet Recommendation: Heart Healthy     Chief Complaint  Patient presents with   Shortness of Breath     Brief history of present illness from the day of admission and additional interim summary    Jeremy [name]  is a 83 y.o. male, with past medical history of CAD, COPD on room air, GERD, hyperlipidemia, hypertension, story of GI bleed last year Requiring hospitalization, patient presents secondary to dyspnea, patient complains of multiple symptoms including generalized weakness, fatigue for the last 2 weeks, it has been worsening over the last week, he had urgent care visit x2 over last 10 days, received antibiotics without treatment, patient reports he was exposed to an individual who is COVID-19 positive around 10 days ago.  He presented to the ER with some nonspecific viral infection related symptoms including body aches fever, loss of smell and mild shortness of breath.  He was also dehydrated and subsequently admitted for further work-up.                                                                 Hospital Course    COVID-19 infection.  Minimal to no lung involvement, was placed on steroids with good effect, inflammatory markers stable, currently no cough or shortness of breath stable on room air  and wants to go home.  Will be discharged home on a short course of oral steroid taper with outpatient PCP follow-up.  COPD.  Stable no wheezing continue home medications.  GERD.  On PPI.  History of CAD and hypertension.  Continue home medications unchanged no acute issues.  On aspirin and statin for secondary prevention along with ACE inhibitor for hypertension.  Dehydration.  Resolved with IV fluids and hydration.   Discharge diagnosis     Active Problems:   Hypertension   Hyperlipidemia   CAD (coronary artery disease)   COPD (chronic obstructive pulmonary disease) ([place])   Dyspepsia   COVID-19   COVID-19 virus infection    Discharge instructions    Discharge Instructions    Diet - low sodium heart healthy   Complete by: As directed    Discharge instructions   Complete by: As directed    Follow with Primary MD Jeremy Gravel, MD in 7 days  Get CBC, CMP, 2 view Chest X ray -  checked next visit within 1 week by Primary MD    Activity: As tolerated with Full fall precautions use walker/cane & assistance as needed  Disposition Home    Diet: Heart Healthy    Special Instructions: If you have smoked or chewed Tobacco  in the last 2 yrs please stop smoking, stop any regular Alcohol  and or any Recreational drug use.  On your next visit with your primary care physician please Get Medicines reviewed and adjusted.  Please request your Prim.MD to go over all Hospital Tests and Procedure/Radiological results at the follow up, please get all Hospital records sent to your Prim MD by signing hospital release before you go home.  If you experience worsening of your admission symptoms, develop shortness of breath, life threatening emergency, suicidal or homicidal thoughts you must seek medical attention immediately by calling 911 or calling your MD immediately  if symptoms less severe.  You Must read complete instructions/literature along with all the possible adverse reactions/side  effects for all the Medicines you take and that have been prescribed to you. Take any new Medicines after you have completely understood and accpet all the possible adverse reactions/side effects.   Increase activity slowly   Complete by: As directed       Discharge Medications   Allergies as of 04/19/2019   No Known Allergies     Medication List    TAKE these medications   acetaminophen 500 MG tablet Commonly known as: TYLENOL Take 500-1,000 mg by mouth daily as needed for moderate pain or headache.   albuterol 108 (90 Base) MCG/ACT inhaler Commonly known as: VENTOLIN HFA Inhale 1-2 puffs into the lungs every 6 (six) hours as needed for wheezing or shortness of breath.   aspirin 81 MG tablet Take 1 tablet (81 mg total) by mouth every evening.   diphenhydrAMINE 25 MG tablet Commonly known as: BENADRYL Take 25 mg by mouth at bedtime as needed for sleep.   guaiFENesin 600 MG 12 hr tablet Commonly known as: MUCINEX Take 600 mg by mouth 2 (two) times daily as needed for cough or to loosen phlegm.   lisinopril 5 MG tablet Commonly known as: ZESTRIL TAKE 1 TABLET BY MOUTH DAILY   loratadine 10 MG tablet Commonly known as: CLARITIN Take 10 mg by mouth daily.   nitroGLYCERIN 0.4 MG SL tablet Commonly known as: NITROSTAT PLACE 1 TABLET UNDER THE TONGUE EVERY 5 MINUTES FOR 3 DOSES AS NEEDED FOR CHEST PAIN What changed: See the new instructions.   OVER THE COUNTER MEDICATION Apply 1 application topically daily as needed (pain). [place] gel FOR BACK PAIN   pantoprazole 40 MG tablet Commonly known as: PROTONIX TAKE 1 TABLET(40 MG) BY MOUTH DAILY What changed: See the new instructions.   predniSONE 5 MG tablet Commonly known as: DELTASONE Label  & dispense according to the schedule below. take 8 Pills PO for 3 days, 6 Pills PO for 3 days, 4 Pills PO for 3 days, 2 Pills PO for 3 days, 1 Pills PO for 3 days, 1/2 Pill  PO for 3 days then STOP. Total 65 pills.   simvastatin 40 MG  tablet Commonly known as: [place] 1 TABLET BY MOUTH EVERY EVENING       Follow-up Information    Jeremy Gravel, MD. Schedule an appointment as soon as possible for a visit in 1 week(s).   Specialty: Internal Medicine Contact information: [address]  [address] [place] [address] [phone]           Major procedures and Radiology Reports - PLEASE review detailed and final reports thoroughly  -       Dg Chest Port 1 View  Result Date: 04/19/2019 CLINICAL DATA:  COVID-19 infection. EXAM: PORTABLE CHEST 1 VIEW COMPARISON:  04/17/2019 05/02/2016. FINDINGS: Mediastinum hilar structures are normal. Heart size stable. Chronic mild bilateral interstitial prominence again noted. No focal alveolar infiltrate. No pleural effusion or pneumothorax. Degenerative change thoracic spine. IMPRESSION: Chronic mild bilateral interstitial prominence again noted. No evidence of acute infiltrate. Electronically Signed   By: Marcello Moores  Register   On: 04/19/2019 08:40   Dg Chest Port 1 View  Result Date: 04/17/2019 CLINICAL DATA:  Shortness of breath.  Productive cough. EXAM: PORTABLE CHEST 1 VIEW COMPARISON:  05/02/2016. FINDINGS: Mediastinum hilar structures normal. Lungs are clear of acute infiltrates. Chronic mild bibasilar interstitial prominence. No pleural effusion or pneumothorax. Biapical pleural thickening again noted consistent scarring. Heart size normal. No acute bony abnormality. Degenerative change thoracic spine. IMPRESSION: Chronic mild bibasilar interstitial prominence. No acute infiltrates. Electronically Signed   By: Marcello Moores  Register   On: 04/17/2019 10:26    Micro Results      Recent Results (from the past 240 hour(s))  Blood Culture (routine x 2)     Status: None (Preliminary result)   Collection Time: 04/17/19  9:59 AM   Specimen: BLOOD LEFT FOREARM  Result Value Ref Range Status   Specimen Description BLOOD LEFT FOREARM  Final   Special Requests   Final    BOTTLES  DRAWN AEROBIC AND ANAEROBIC Blood Culture adequate volume   Culture   Final    NO GROWTH < 24 HOURS Performed at [facility], [address], [place], [place] [address]    Report Status PENDING  Incomplete  Blood Culture (routine x 2)     Status: None (Preliminary result)   Collection Time: 04/17/19 10:10 AM   Specimen: BLOOD RIGHT FOREARM  Result Value Ref Range Status   Specimen Description BLOOD RIGHT FOREARM  Final   Special Requests   Final    BOTTLES DRAWN AEROBIC AND ANAEROBIC Blood Culture adequate volume   Culture   Final    NO GROWTH < 24 HOURS Performed at [facility], [address], [place], [place] [address]    Report Status PENDING  Incomplete  SARS Coronavirus 2 [facility] order, Performed in [place] hospital lab)     Status: Abnormal   Collection Time: 04/17/19 10:19 AM  Result Value Ref Range Status   SARS Coronavirus 2 POSITIVE (A) NEGATIVE Final    Comment: RESULT CALLED TO, READ BACK BY AND VERIFIED WITH: CREWS M. AT 1207P ON [id number] BY THOMPSON S. Performed at [facility], [address] [place], [place] [address]   Urine culture     Status: None   Collection Time: 04/17/19  1:11 PM   Specimen: In/Out Cath Urine  Result Value Ref Range Status   Specimen Description   Final    IN/OUT CATH URINE Performed at [facility], [address] [place], [place] [address]    Special Requests   Final    NONE Performed at [facility], [address], [place], [place] [address]    Culture   Final    NO GROWTH Performed at [facility], [place] [address] [place], [place] [address]    Report Status 04/18/2019 FINAL  Final    Today  Subjective    Jeneen Rinks [place] today has no headache,no chest abdominal pain,no new weakness tingling or numbness, feels much better wants to go home today.     Objective   Blood pressure 121/71, pulse (!) 59, temperature 98 F (36.7 C), temperature source Oral, resp. rate 20, height 6' (1.829 m), weight 76.3 kg, SpO2 95  %.   Intake/Output Summary (Last 24 hours) at 04/19/2019 0919 Last data filed at 04/19/2019 7588 Gross per 24 hour  Intake --  Output 700 ml  Net -700 ml    Exam  Awake Alert, Oriented x 3, No new F.N deficits, Normal affect [place].AT,PERRAL Supple Neck,No JVD, No cervical lymphadenopathy appriciated.  Symmetrical Chest wall movement, Good air movement bilaterally, CTAB RRR,No Gallops,Rubs or new Murmurs, No Parasternal Heave +ve B.Sounds, Abd Soft, Non tender, No organomegaly appriciated, No rebound -guarding or rigidity. No Cyanosis, Clubbing or edema, No new Rash or bruise   Data Review   CBC w Diff:  Lab Results  Component Value Date   WBC 6.6 04/19/2019   HGB 13.7 04/19/2019   HCT 39.5 04/19/2019   PLT 124 (L) 04/19/2019   LYMPHOPCT 8 04/19/2019   MONOPCT 4 04/19/2019   EOSPCT 0 04/19/2019   BASOPCT 0 04/19/2019    CMP:  Lab Results  Component Value Date   NA 137 04/19/2019   K 4.1 04/19/2019   CL 103 04/19/2019   CO2 27 04/19/2019   BUN 28 (H) 04/19/2019   CREATININE 0.85 04/19/2019   CREATININE 1.16 (H) 03/25/2018   PROT 5.7 (L) 04/19/2019   ALBUMIN 3.0 (L) 04/19/2019   BILITOT 0.4 04/19/2019   ALKPHOS 57 04/19/2019   AST 16 04/19/2019   ALT 14 04/19/2019  . COVID-19 Labs  Recent Labs    04/17/19 1414 04/18/19 0155 04/19/19 0330  DDIMER 0.39 0.61* <0.27  FERRITIN 170 200 198  CRP 3.8* 3.3* 1.0*    Lab Results  Component Value Date   SARSCOV2NAA POSITIVE (A) 04/17/2019     Total Time in preparing paper work, data evaluation and todays exam - 48 minutes  Lala Lund M.D on 04/19/2019 at 9:19 AM  [facility]   Office  [phone]

## 2019-04-19 NOTE — Discharge Instructions (Signed)
Follow with Primary MD Jani Gravel, MD in 7 days   Get CBC, CMP, 2 view Chest X ray -  checked next visit within 1 week by Primary MD    Activity: As tolerated with Full fall precautions use walker/cane & assistance as needed  Disposition Home    Diet: Heart Healthy    Special Instructions: If you have smoked or chewed Tobacco  in the last 2 yrs please stop smoking, stop any regular Alcohol  and or any Recreational drug use.  On your next visit with your primary care physician please Get Medicines reviewed and adjusted.  Please request your Prim.MD to go over all Hospital Tests and Procedure/Radiological results at the follow up, please get all Hospital records sent to your Prim MD by signing hospital release before you go home.  If you experience worsening of your admission symptoms, develop shortness of breath, life threatening emergency, suicidal or homicidal thoughts you must seek medical attention immediately by calling 911 or calling your MD immediately  if symptoms less severe.  You Must read complete instructions/literature along with all the possible adverse reactions/side effects for all the Medicines you take and that have been prescribed to you. Take any new Medicines after you have completely understood and accpet all the possible adverse reactions/side effects.      Person Under Monitoring Name: Jeremy [name]  Location: [place] [address]   Infection Prevention Recommendations for Individuals Confirmed to have, or Being Evaluated for, 2019 Novel Coronavirus (COVID-19) Infection Who Receive Care at Home  Individuals who are confirmed to have, or are being evaluated for, COVID-19 should follow the prevention steps below until a healthcare provider or local or state health department says they can return to normal activities.  Stay home except to get medical care You should restrict activities outside your home, except for getting medical care. Do not  go to work, school, or public areas, and do not use public transportation or taxis.  Call ahead before visiting your doctor Before your medical appointment, call the healthcare provider and tell them that you have, or are being evaluated for, COVID-19 infection. This will help the healthcare providers office take steps to keep other people from getting infected. Ask your healthcare provider to call the local or state health department.  Monitor your symptoms Seek prompt medical attention if your illness is worsening (e.g., difficulty breathing). Before going to your medical appointment, call the healthcare provider and tell them that you have, or are being evaluated for, COVID-19 infection. Ask your healthcare provider to call the local or state health department.  Wear a facemask You should wear a facemask that covers your nose and mouth when you are in the same room with other people and when you visit a healthcare provider. People who live with or visit you should also wear a facemask while they are in the same room with you.  Separate yourself from other people in your home As much as possible, you should stay in a different room from other people in your home. Also, you should use a separate bathroom, if available.  Avoid sharing household items You should not share dishes, drinking glasses, cups, eating utensils, towels, bedding, or other items with other people in your home. After using these items, you should wash them thoroughly with soap and water.  Cover your coughs and sneezes Cover your mouth and nose with a tissue when you cough or sneeze, or you can cough or  sneeze into your sleeve. Throw used tissues in a lined trash can, and immediately wash your hands with soap and water for at least 20 seconds or use an alcohol-based hand rub.  Wash your [facility] your hands often and thoroughly with soap and water for at least 20 seconds. You can use an alcohol-based  hand sanitizer if soap and water are not available and if your hands are not visibly dirty. Avoid touching your eyes, nose, and mouth with unwashed hands.   Prevention Steps for Caregivers and Household Members of Individuals Confirmed to have, or Being Evaluated for, COVID-19 Infection Being Cared for in the Home  If you live with, or provide care at home for, a person confirmed to have, or being evaluated for, COVID-19 infection please follow these guidelines to prevent infection:  Follow healthcare providers instructions Make sure that you understand and can help the patient follow any healthcare provider instructions for all care.  Provide for the patients basic needs You should help the patient with basic needs in the home and provide support for getting groceries, prescriptions, and other personal needs.  Monitor the patients symptoms If they are getting sicker, call his or her medical provider and tell them that the patient has, or is being evaluated for, COVID-19 infection. This will help the healthcare providers office take steps to keep other people from getting infected. Ask the healthcare provider to call the local or state health department.  Limit the number of people who have contact with the patient  If possible, have only one caregiver for the patient.  Other household members should stay in another home or place of residence. If this is not possible, they should stay  in another room, or be separated from the patient as much as possible. Use a separate bathroom, if available.  Restrict visitors who do not have an essential need to be in the home.  Keep older adults, very young children, and other sick people away from the patient Keep older adults, very young children, and those who have compromised immune systems or chronic health conditions away from the patient. This includes people with chronic heart, lung, or kidney conditions, diabetes, and  cancer.  Ensure good ventilation Make sure that shared spaces in the home have good air flow, such as from an air conditioner or an opened window, weather permitting.  Wash your hands often  Wash your hands often and thoroughly with soap and water for at least 20 seconds. You can use an alcohol based hand sanitizer if soap and water are not available and if your hands are not visibly dirty.  Avoid touching your eyes, nose, and mouth with unwashed hands.  Use disposable paper towels to dry your hands. If not available, use dedicated cloth towels and replace them when they become wet.  Wear a facemask and gloves  Wear a disposable facemask at all times in the room and gloves when you touch or have contact with the patients blood, body fluids, and/or secretions or excretions, such as sweat, saliva, sputum, nasal mucus, vomit, urine, or feces.  Ensure the mask fits over your nose and mouth tightly, and do not touch it during use.  Throw out disposable facemasks and gloves after using them. Do not reuse.  Wash your hands immediately after removing your facemask and gloves.  If your personal clothing becomes contaminated, carefully remove clothing and launder. Wash your hands after handling contaminated clothing.  Place all used disposable facemasks, gloves, and other waste  in a lined container before disposing them with other household waste.  Remove gloves and wash your hands immediately after handling these items.  Do not share dishes, glasses, or other household items with the patient  Avoid sharing household items. You should not share dishes, drinking glasses, cups, eating utensils, towels, bedding, or other items with a patient who is confirmed to have, or being evaluated for, COVID-19 infection.  After the person uses these items, you should wash them thoroughly with soap and water.  Wash laundry thoroughly  Immediately remove and wash clothes or bedding that have blood, body  fluids, and/or secretions or excretions, such as sweat, saliva, sputum, nasal mucus, vomit, urine, or feces, on them.  Wear gloves when handling laundry from the patient.  Read and follow directions on labels of laundry or clothing items and detergent. In general, wash and dry with the warmest temperatures recommended on the label.  Clean all areas the individual has used often  Clean all touchable surfaces, such as counters, tabletops, doorknobs, bathroom fixtures, toilets, phones, keyboards, tablets, and bedside tables, every day. Also, clean any surfaces that may have blood, body fluids, and/or secretions or excretions on them.  Wear gloves when cleaning surfaces the patient has come in contact with.  Use a diluted bleach solution (e.g., dilute bleach with 1 part bleach and 10 parts water) or a household disinfectant with a label that says EPA-registered for coronaviruses. To make a bleach solution at home, add 1 tablespoon of bleach to 1 quart (4 cups) of water. For a larger supply, add  cup of bleach to 1 gallon (16 cups) of water.  Read labels of cleaning products and follow recommendations provided on product labels. Labels contain instructions for safe and effective use of the cleaning product including precautions you should take when applying the product, such as wearing gloves or eye protection and making sure you have good ventilation during use of the product.  Remove gloves and wash hands immediately after cleaning.  Monitor yourself for signs and symptoms of illness Caregivers and household members are considered close contacts, should monitor their health, and will be asked to limit movement outside of the home to the extent possible. Follow the monitoring steps for close contacts listed on the symptom monitoring form.   ? If you have additional questions, contact your local health department or call the epidemiologist on call at [phone] (available 24/7). ? This  guidance is subject to change. For the most up-to-date guidance from [facility], please refer to their website: [email or web address]

## 2019-04-22 LAB — CULTURE, BLOOD (ROUTINE X 2)
Culture: NO GROWTH
Culture: NO GROWTH
Special Requests: ADEQUATE
Special Requests: ADEQUATE

## 2019-05-02 ENCOUNTER — Other Ambulatory Visit: Payer: Self-pay

## 2019-05-02 DIAGNOSIS — Z20822 Contact with and (suspected) exposure to covid-19: Secondary | ICD-10-CM

## 2019-05-04 LAB — NOVEL CORONAVIRUS, NAA: SARS-CoV-2, NAA: NOT DETECTED

## 2019-05-10 ENCOUNTER — Other Ambulatory Visit: Payer: Self-pay | Admitting: Cardiology

## 2019-05-18 ENCOUNTER — Other Ambulatory Visit: Payer: Self-pay

## 2019-05-18 DIAGNOSIS — R6889 Other general symptoms and signs: Secondary | ICD-10-CM | POA: Diagnosis not present

## 2019-05-18 DIAGNOSIS — Z20822 Contact with and (suspected) exposure to covid-19: Secondary | ICD-10-CM

## 2019-05-19 LAB — NOVEL CORONAVIRUS, NAA: SARS-CoV-2, NAA: NOT DETECTED

## 2019-07-18 DIAGNOSIS — Z23 Encounter for immunization: Secondary | ICD-10-CM | POA: Diagnosis not present

## 2019-08-09 ENCOUNTER — Other Ambulatory Visit: Payer: Self-pay | Admitting: *Deleted

## 2019-08-09 MED ORDER — LISINOPRIL 5 MG PO TABS: 5.0000 mg | ORAL_TABLET | Freq: Every day | ORAL | 0 refills | Status: DC

## 2019-09-06 ENCOUNTER — Telehealth: Payer: Self-pay | Admitting: Cardiology

## 2019-09-06 MED ORDER — PANTOPRAZOLE SODIUM 40 MG PO TBEC: DELAYED_RELEASE_TABLET | ORAL | 1 refills | Status: DC

## 2019-09-06 NOTE — Telephone Encounter (Signed)
Refill sent.

## 2019-09-06 NOTE — Telephone Encounter (Signed)
New message   Patient needs a new prescription for pantoprazole (PROTONIX) 40 MG tablet sent to [facility], [place]. HARRISON S

## 2019-10-13 ENCOUNTER — Other Ambulatory Visit: Payer: Self-pay

## 2019-10-13 MED ORDER — SIMVASTATIN 40 MG PO TABS: 40.0000 mg | ORAL_TABLET | Freq: Every evening | ORAL | 1 refills | Status: DC

## 2019-10-20 ENCOUNTER — Other Ambulatory Visit: Payer: Self-pay

## 2019-10-30 ENCOUNTER — Other Ambulatory Visit: Payer: Self-pay

## 2019-10-30 ENCOUNTER — Emergency Department (HOSPITAL_COMMUNITY): Payer: Medicare Other

## 2019-10-30 ENCOUNTER — Emergency Department (HOSPITAL_COMMUNITY)
Admission: EM | Admit: 2019-10-30 | Discharge: 2019-10-30 | Disposition: A | Discharge status: Home / Self Care | Payer: Medicare Other | Attending: Emergency Medicine | Admitting: Emergency Medicine

## 2019-10-30 ENCOUNTER — Encounter (HOSPITAL_COMMUNITY): Payer: Self-pay | Admitting: Emergency Medicine

## 2019-10-30 DIAGNOSIS — Z8616 Personal history of COVID-19: Secondary | ICD-10-CM | POA: Diagnosis not present

## 2019-10-30 DIAGNOSIS — J449 Chronic obstructive pulmonary disease, unspecified: Secondary | ICD-10-CM | POA: Insufficient documentation

## 2019-10-30 DIAGNOSIS — S51812A Laceration without foreign body of left forearm, initial encounter: Secondary | ICD-10-CM | POA: Insufficient documentation

## 2019-10-30 DIAGNOSIS — Y9289 Other specified places as the place of occurrence of the external cause: Secondary | ICD-10-CM | POA: Insufficient documentation

## 2019-10-30 DIAGNOSIS — Y939 Activity, unspecified: Secondary | ICD-10-CM | POA: Insufficient documentation

## 2019-10-30 DIAGNOSIS — I10 Essential (primary) hypertension: Secondary | ICD-10-CM | POA: Diagnosis not present

## 2019-10-30 DIAGNOSIS — Z9049 Acquired absence of other specified parts of digestive tract: Secondary | ICD-10-CM | POA: Diagnosis not present

## 2019-10-30 DIAGNOSIS — Y998 Other external cause status: Secondary | ICD-10-CM | POA: Insufficient documentation

## 2019-10-30 DIAGNOSIS — W0110XA Fall on same level from slipping, tripping and stumbling with subsequent striking against unspecified object, initial encounter: Secondary | ICD-10-CM | POA: Insufficient documentation

## 2019-10-30 DIAGNOSIS — I252 Old myocardial infarction: Secondary | ICD-10-CM | POA: Insufficient documentation

## 2019-10-30 DIAGNOSIS — Z87891 Personal history of nicotine dependence: Secondary | ICD-10-CM | POA: Diagnosis not present

## 2019-10-30 DIAGNOSIS — Z7982 Long term (current) use of aspirin: Secondary | ICD-10-CM | POA: Diagnosis not present

## 2019-10-30 DIAGNOSIS — Z23 Encounter for immunization: Secondary | ICD-10-CM | POA: Diagnosis not present

## 2019-10-30 DIAGNOSIS — Z85828 Personal history of other malignant neoplasm of skin: Secondary | ICD-10-CM | POA: Diagnosis not present

## 2019-10-30 DIAGNOSIS — I2510 Atherosclerotic heart disease of native coronary artery without angina pectoris: Secondary | ICD-10-CM | POA: Diagnosis not present

## 2019-10-30 DIAGNOSIS — Z79899 Other long term (current) drug therapy: Secondary | ICD-10-CM | POA: Diagnosis not present

## 2019-10-30 DIAGNOSIS — S59912A Unspecified injury of left forearm, initial encounter: Secondary | ICD-10-CM | POA: Diagnosis present

## 2019-10-30 MED ORDER — DIPHTH-ACELL PERTUSSIS-TETANUS 25-58-10 LF-MCG/0.5 IM SUSP
0.5000 mL | Freq: Once | INTRAMUSCULAR | Status: AC
  Administered 2019-10-30: 11:00:00 0.5 mL via INTRAMUSCULAR
  Filled 2019-10-30: qty 0.5

## 2019-10-30 MED ORDER — POVIDONE-IODINE 10 % EX SOLN
CUTANEOUS | Status: AC
  Filled 2019-10-30: qty 15

## 2019-10-30 MED ORDER — LIDOCAINE-EPINEPHRINE (PF) 1 %-1:200000 IJ SOLN
20.0000 mL | Freq: Once | INTRAMUSCULAR | Status: AC
  Administered 2019-10-30: 20 mL via INTRADERMAL
  Filled 2019-10-30: qty 30

## 2019-10-30 MED ORDER — FENTANYL CITRATE (PF) 100 MCG/2ML IJ SOLN
50.0000 ug | Freq: Once | INTRAMUSCULAR | Status: AC
  Administered 2019-10-30: 11:00:00 50 ug via INTRAVENOUS
  Filled 2019-10-30: qty 2

## 2019-10-30 MED ORDER — SODIUM CHLORIDE 0.9 % IV BOLUS
500.0000 mL | Freq: Once | INTRAVENOUS | Status: AC
  Administered 2019-10-30: 11:00:00 500 mL via INTRAVENOUS

## 2019-10-30 MED ORDER — CEFAZOLIN SODIUM-DEXTROSE 2-4 GM/100ML-% IV SOLN
2.0000 g | Freq: Once | INTRAVENOUS | Status: AC
  Administered 2019-10-30: 11:00:00 2 g via INTRAVENOUS
  Filled 2019-10-30: qty 100

## 2019-10-30 NOTE — ED Provider Notes (Signed)
[facility] EMERGENCY DEPARTMENT Provider Note   CSN: [id number] Arrival date & time: 10/30/19  0957     History Chief Complaint  Patient presents with  . Laceration    Jeremy [name] is a 84 y.o. male.  HPI      Jeremy [name] is a 84 y.o. male who presents to the Emergency Department complaining of laceration of his left forearm secondary to a mechanical fall that occurred just prior to ER arrival.  He states that he was working in his shop and tripped over something and his left arm struck something as he fell.  He states that he has a lot of tools in his shop and he is unclear what he struck his arm on.  He denies pain of his arm, head injury, LOC, dizziness, numbness or weakness of his extremities and symptoms preceding the fall.   Past Medical History:  Diagnosis Date  . Abnormal CXR 4/20011   Chest  CT mode/serere COPD ( no mediastinal abnormality)  . CAD (coronary artery disease) 2005   anterior MI with stent to the LAD in 1991 / stent 1996 / nuclear 2005, no ischemia  . Carotid bruit    Doppler, June, 2013, 0-39% bilateral  . COPD (chronic obstructive pulmonary disease) ([place])   . Diverticula of colon   . Ejection fraction    55-60%, echo, April, 2011, could not estimate RV pressure  . Esophagitis   . HOH (hard of hearing)   . Hyperlipidemia   . Hypertension   . Myocardial infarction ([place])   . Shortness of breath    with exertion  . Sleep apnea    Stop Bang score of 5  . Tobacco abuse     Patient Active Problem List   Diagnosis Date Noted  . COVID-19 virus infection 04/18/2019  . COVID-19 04/17/2019  . Special screening for malignant neoplasms, colon 09/21/2018  . Diverticulosis of large intestine with hemorrhage   . GI bleed due to NSAIDs 11/17/2017  . Rectal bleed 11/16/2017  . GI bleed 11/16/2017  . Hypokalemia 11/16/2017  . Chest pain 05/02/2016  . H. pylori infection 08/15/2015  . Mucosal abnormality of stomach   . Reflux esophagitis   . Peptic  stricture of esophagus   . Rectal bleeding 05/21/2015  . Dyspepsia 05/21/2015  . Dysphagia, pharyngoesophageal phase 05/21/2015  . Sinus bradycardia 03/19/2014  . Carotid artery disease ([place]) 03/15/2014  . Hypertension   . Hyperlipidemia   . CAD (coronary artery disease)   . Abnormal CXR   . Ejection fraction   . Tobacco abuse   . COPD (chronic obstructive pulmonary disease) ([place])     Past Surgical History:  Procedure Laterality Date  . APPENDECTOMY    . BIOPSY  11/17/2017   Procedure: BIOPSY;  Surgeon: Rogene Houston, MD;  Location: [facility];  Service: Endoscopy;;  gastric  . CARDIAC CATHETERIZATION    . CATARACT EXTRACTION Left   . CATARACT EXTRACTION W/PHACO Right 01/22/2014   Procedure: CATARACT EXTRACTION PHACO AND INTRAOCULAR LENS PLACEMENT (IOC);  Surgeon: Tonny Branch, MD;  Location: [facility] ORS;  Service: Ophthalmology;  Laterality: Right;  CDE 7.46  . CHOLECYSTECTOMY     8 years ago [facility]  . COLONOSCOPY  10/05/2012   Dr. Laural Golden: multiple diverticula at sigmoid colon, multiple polyps (tubular adenomas), small external hemorrhoids  . COLONOSCOPY N/A 11/18/2017   Procedure: COLONOSCOPY;  Surgeon: Rogene Houston, MD;  Location: [facility];  Service: Endoscopy;  Laterality: N/A;  .  CYST REMOVAL HAND     right wrist  . ESOPHAGEAL DILATION N/A 05/23/2015   Procedure: ESOPHAGEAL DILATION;  Surgeon: Daneil Dolin, MD;  Location: [facility];  Service: Endoscopy;  Laterality: N/A;  . ESOPHAGEAL DILATION N/A 11/17/2017   Procedure: ESOPHAGEAL DILATION;  Surgeon: Rogene Houston, MD;  Location: [facility];  Service: Endoscopy;  Laterality: N/A;  . ESOPHAGOGASTRODUODENOSCOPY N/A 05/23/2015   Dr. Gala Romney: Ulcerative/ erosive reflux esophagitis with peptic stricture formation. Status post dilation as described. Hiatal hernia. Abnormal gastric mucosa of uncertain significance status post gastric biospy. +H.pylori  . ESOPHAGOGASTRODUODENOSCOPY N/A 11/17/2017   Procedure:  ESOPHAGOGASTRODUODENOSCOPY (EGD);  Surgeon: Rogene Houston, MD;  Location: [facility];  Service: Endoscopy;  Laterality: N/A;  . PERCUTANEOUS STENT INTERVENTION    . SKIN CANCER EXCISION  2015   ear & neck  . TONSILLECTOMY         Family History  Problem Relation Age of Onset  . Heart disease Mother   . Heart disease Father   . Heart disease Brother   . Stomach cancer Brother   . Colon cancer Neg Hx     Social History   Tobacco Use  . Smoking status: Former Smoker    Packs/day: 3.00    Years: 12.00    Pack years: 36.00    Types: Cigarettes    Start date: 09/15/1983    Quit date: 09/14/1994    Years since quitting: 25.1  . Smokeless tobacco: Current User    Types: Chew  . Tobacco comment: chews 80% of a pack tobacco per day -- 10/12 doesnt chew too much  Substance Use Topics  . Alcohol use: No    Alcohol/week: 0.0 standard drinks  . Drug use: No    Home Medications Prior to Admission medications   Medication Sig Start Date End Date Taking? Authorizing Provider  acetaminophen (TYLENOL) 500 MG tablet Take 500-1,000 mg by mouth daily as needed for moderate pain or headache.   Yes [provider]  albuterol (PROVENTIL HFA;VENTOLIN HFA) 108 (90 Base) MCG/ACT inhaler Inhale 1-2 puffs into the lungs every 6 (six) hours as needed for wheezing or shortness of breath.   Yes [provider]  aspirin 81 MG tablet Take 1 tablet (81 mg total) by mouth every evening. 11/20/17  Yes Barton Dubois, MD  diphenhydrAMINE (BENADRYL) 25 MG tablet Take 25 mg by mouth at bedtime as needed for sleep.   Yes [provider]  guaiFENesin (MUCINEX) 600 MG 12 hr tablet Take 600 mg by mouth 2 (two) times daily as needed for cough or to loosen phlegm.   Yes [provider]  lisinopril (ZESTRIL) 5 MG tablet Take 1 tablet (5 mg total) by mouth daily. 08/09/19  Yes BranchAlphonse Guild, MD  loratadine (CLARITIN) 10 MG tablet Take 10 mg by mouth daily.   Yes [provider]  nitroGLYCERIN (NITROSTAT) 0.4 MG SL tablet PLACE 1 TABLET UNDER THE TONGUE EVERY 5 MINUTES FOR 3 DOSES AS NEEDED FOR CHEST PAIN Patient taking differently: Place 0.4 mg under the tongue every 5 (five) minutes as needed for chest pain.  08/11/16  Yes BranchAlphonse Guild, MD  OVER THE COUNTER MEDICATION Apply 1 application topically daily as needed (pain). [place] gel FOR BACK PAIN   Yes [provider]  pantoprazole (PROTONIX) 40 MG tablet TAKE 1 TABLET(40 MG) BY MOUTH DAILY 09/06/19  Yes Branch, Alphonse Guild, MD  simvastatin (ZOCOR) 40 MG tablet Take 1 tablet (  40 mg total) by mouth every evening. 10/13/19  Yes Branch, Alphonse Guild, MD  predniSONE (DELTASONE) 5 MG tablet Label  & dispense according to the schedule below. take 8 Pills PO for 3 days, 6 Pills PO for 3 days, 4 Pills PO for 3 days, 2 Pills PO for 3 days, 1 Pills PO for 3 days, 1/2 Pill  PO for 3 days then STOP. Total 65 pills. Patient not taking: Reported on 10/30/2019 04/19/19   Thurnell Lose, MD    Allergies    Patient has no known allergies.  Review of Systems   Review of Systems  Constitutional: Negative for chills and fever.  Respiratory: Negative for shortness of breath.   Cardiovascular: Negative for chest pain.  Gastrointestinal: Negative for nausea and vomiting.  Musculoskeletal: Negative for arthralgias, back pain and joint swelling.  Skin: Positive for wound.       Laceration left forearm  Neurological: Negative for dizziness, syncope, speech difficulty, weakness, numbness and headaches.  Hematological: Does not bruise/bleed easily.    Physical Exam Updated Vital Signs BP (!) 182/89   Pulse 60   Temp 98 F (36.7 C) (Oral)   Resp 18   Ht 6' (1.829 m)   Wt 83.9 kg   SpO2 94%   BMI 25.09 kg/m   Physical Exam Vitals and nursing note reviewed.  Constitutional:      General: He is not in acute distress.    Appearance: Normal appearance. He is well-developed.  HENT:     Head: Atraumatic.       Mouth/Throat:     Mouth: Mucous membranes are moist.  Eyes:     Extraocular Movements: Extraocular movements intact.     Pupils: Pupils are equal, round, and reactive to light.  Cardiovascular:     Rate and Rhythm: Normal rate and regular rhythm.     Pulses: Normal pulses.  Pulmonary:     Effort: Pulmonary effort is normal. No respiratory distress.     Breath sounds: Normal breath sounds.  Chest:     Chest wall: No tenderness.  Abdominal:     General: There is no distension.     Palpations: Abdomen is soft.     Tenderness: There is no abdominal tenderness.  Musculoskeletal:        General: Signs of injury present. No tenderness.     Left forearm: Laceration present. No swelling, edema, tenderness or bony tenderness.     Cervical back: Normal range of motion. No tenderness.     Comments: 5 x 7 cm flap type laceration of the mid left forearm.  Bleeding controlled.  No edema.  Left elbow, wrist and shoulder are non-tender.  He has full ROM of the elbow and fingers of left hand.  nml finger/thumb opposition.  Compartments are soft. Wound explored, no FB's.  Fascia exposed, but w/o visible injury.    Skin:    General: Skin is warm.  Neurological:     General: No focal deficit present.     Mental Status: He is alert.     Sensory: Sensation is intact. No sensory deficit.     Motor: Motor function is intact. No weakness or abnormal muscle tone.     Coordination: Coordination is intact. Coordination normal.     Comments: CN II-XII grossly intact.  Speech clear     ED Results / Procedures / Treatments   Labs (all labs ordered are listed, but only abnormal results are displayed) Labs Reviewed - No data  to display  EKG None  Radiology DG Elbow Complete Left  Result Date: 10/30/2019 CLINICAL DATA:  Possible radial head fracture on form film earlier today. EXAM: LEFT ELBOW - COMPLETE 3+ VIEW COMPARISON:  Form film from earlier today. FINDINGS: Four views study shows acute angle  anteriorly at the junction of the radial neck and head. Imaging features raise the question of a subtle impacted nondisplaced fracture. There is minimal spurring at the radial head. No substantial joint effusion. No subluxation or dislocation. IMPRESSION: As noted on the forearm film earlier today, there is a question of minimally impacted radial head fracture at the junction with the neck. MRI would be more definitive means to further evaluate as clinically warranted. Electronically Signed   By: Misty Stanley M.D.   On: 10/30/2019 12:43   DG Forearm Left  Result Date: 10/30/2019 CLINICAL DATA:  Fall.  Arm laceration EXAM: LEFT FOREARM - 2 VIEW COMPARISON:  None. FINDINGS: Probable fracture radial head. No other fracture. Dorsal soft tissue laceration without foreign body. IMPRESSION: Probable fracture radial head. Recommend four views of the left elbow for further evaluation. Electronically Signed   By: Franchot Gallo M.D.   On: 10/30/2019 11:10    Procedures Procedures (including critical care time)    LACERATION REPAIR Performed by: Kem Parkinson, PA-C Authorized by: [place]ina Consent: Verbal consent obtained. Risks and benefits: risks, benefits and alternatives were discussed Consent given by: patient Patient identity confirmed: provided demographic data Prepped and Draped in normal sterile fashion Wound explored  Laceration Location: left forearm  Laceration Length: 5 x 7 cm flap  No Foreign Bodies seen or palpated  Anesthesia: local infiltration  Local anesthetic: lidocaine 1% with epinephrine  Anesthetic total: 7 ml  Irrigation method: syringe Amount of cleaning: standard  Skin closure: 4-0 prolene  Number of sutures: 14  Technique: simple interrupted, loosely approximated.    Patient tolerance: Patient tolerated the procedure well with no immediate complications.   Hemostasis obtained prior to wound closure.  Remains neurovascularly intact.  No motor  deficits.  No foreign bodies or injuries to deep structures of the forearm.  Wound was loosely approximated with 14 sutures.  Xeroform dressing applied and wound care instructions were discussed. Sutures out in 8-10 days    Medications Ordered in ED Medications  povidone-iodine (BETADINE) 10 % external solution (has no administration in time range)  sodium chloride 0.9 % bolus 500 mL (0 mLs Intravenous Stopped 10/30/19 1135)  fentaNYL (SUBLIMAZE) injection 50 mcg (50 mcg Intravenous Given 10/30/19 1100)  ceFAZolin (ANCEF) IVPB 2g/100 mL premix (0 g Intravenous Stopped 10/30/19 1135)  diphtheria-acellular pertussis-tetanus (INFANRIX) injection 0.5 mL (0.5 mLs Intramuscular Given 10/30/19 1105)  lidocaine-EPINEPHrine (XYLOCAINE-EPINEPHrine) 1 %-1:200000 (PF) injection 20 mL (20 mLs Intradermal Given by Other 10/30/19 1048)    ED Course  I have reviewed the triage vital signs and the nursing notes.  Pertinent labs & imaging results that were available during my care of the patient were reviewed by me and considered in my medical decision making (see chart for details).    MDM Rules/Calculators/A&P                      Pt with mechanical fall and laceration of the mid left forearm.  Neurovascularly intact, compartments are soft.  Hemostasis obtained prior to wound closure.  Wound was irrigated thoroughly explored through range of motion.  X-ray findings suggest possible radial head fracture, but clinically patient has full range of motion  of the elbow without tenderness and acute fracture is therefore less likely.  Patient does agree to wound care instructions and sutures out in 8 to 10 days.  Return precautions were discussed.  This was a shared visit, patient also seen by Dr. Lacinda Axon.   Final Clinical Impression(s) / ED Diagnoses Final diagnoses:  Laceration of left forearm, initial encounter    Rx / DC Orders ED Discharge Orders    None       Kem Parkinson, PA-C 11/01/19 1140      Nat Christen, MD 11/03/19 1525

## 2019-10-30 NOTE — Discharge Instructions (Addendum)
Clean the wound with mild soap and water and keep it bandaged.  You may change the bandages once a day.  You may apply Neosporin once a day to the wound.  Avoid excessive use of your left arm for 3 to 4 days.  You may return here in 8 to 10 days for suture removal.  Return sooner if you develop any symptoms concerning for infection such as increasing pain redness or swelling of your arm.

## 2019-10-30 NOTE — ED Notes (Signed)
Per verbal order from PA, Laceration dressed with xeroform gauze applied to skin, telfa layed on top of xerofoam and wrapped with conform gauze. Pt tolerated well. PA aware.

## 2019-10-30 NOTE — ED Provider Notes (Signed)
This is a shared visit.  I was asked by my attending physician to repair laceration to patient's left forearm.  This was my only involvement in this patient's care.    LACERATION REPAIR Performed by: Aldon Hengst Authorized by: Bertie Simien Consent: Verbal consent obtained. Risks and benefits: risks, benefits and alternatives were discussed Consent given by: patient Patient identity confirmed: provided demographic data Prepped and Draped in normal sterile fashion Wound explored  Laceration Location: left forearm  Laceration Length: 5 x 7 cm flap  No Foreign Bodies seen or palpated  Anesthesia: local infiltration  Local anesthetic: lidocaine 1% with epinephrine  Anesthetic total: 7 ml  Irrigation method: syringe Amount of cleaning: standard  Skin closure: 4-0 prolene  Number of sutures: 14  Technique: simple interrupted, loosely approximated.    Patient tolerance: Patient tolerated the procedure well with no immediate complications.   Hemostasis obtained prior to wound closure.  Remains neurovascularly intact.  No motor deficits.  No foreign bodies or injuries to deep structures of the forearm.  Wound was loosely approximated with 14 sutures.  Xeroform dressing applied and wound care instructions were discussed. Sutures out in 8-10 days    Kem Parkinson, Hershal Coria 10/30/19 1137    Nat Christen, MD 10/31/19 [phone]

## 2019-10-30 NOTE — ED Triage Notes (Signed)
Pt states he tripped and fell in his shop and caught his left arm on something. Laceration with bleeding controlled.

## 2019-11-04 ENCOUNTER — Other Ambulatory Visit: Payer: Self-pay | Admitting: Cardiology

## 2019-11-06 ENCOUNTER — Telehealth: Payer: Self-pay | Admitting: *Deleted

## 2019-11-06 NOTE — Telephone Encounter (Signed)
The patient verbally consented for a telehealth phone visit with [facility] and understands that his/her insurance company will be billed for the encounter.  Will try to have vitals.

## 2019-11-08 ENCOUNTER — Encounter: Payer: Self-pay | Admitting: Cardiology

## 2019-11-08 ENCOUNTER — Telehealth (INDEPENDENT_AMBULATORY_CARE_PROVIDER_SITE_OTHER): Payer: Medicare Other | Admitting: Cardiology

## 2019-11-08 ENCOUNTER — Telehealth: Payer: Self-pay | Admitting: Licensed Clinical Social Worker

## 2019-11-08 VITALS — Ht 73.0 in | Wt 190.0 lb

## 2019-11-08 DIAGNOSIS — E785 Hyperlipidemia, unspecified: Secondary | ICD-10-CM

## 2019-11-08 DIAGNOSIS — I2510 Atherosclerotic heart disease of native coronary artery without angina pectoris: Secondary | ICD-10-CM

## 2019-11-08 DIAGNOSIS — I10 Essential (primary) hypertension: Secondary | ICD-10-CM

## 2019-11-08 NOTE — Patient Instructions (Signed)
Medication Instructions:  Your physician recommends that you continue on your current medications as directed. Please refer to the Current Medication list given to you today.  *If you need a refill on your cardiac medications before your next appointment, please call your pharmacy*  Lab Work: None today If you have labs (blood work) drawn today and your tests are completely normal, you will receive your results only by: Marland Kitchen MyChart Message (if you have MyChart) OR . A paper copy in the mail If you have any lab test that is abnormal or we need to change your treatment, we will call you to review the results.  Testing/Procedures: None today  Follow-Up: At [facility], you and your health needs are our priority.  As part of our continuing mission to provide you with exceptional heart care, we have created designated Provider Care Teams.  These Care Teams include your primary Cardiologist (physician) and Advanced Practice Providers (APPs -  Physician Assistants and Nurse Practitioners) who all work together to provide you with the care you need, when you need it.  Your next appointment:   12 month(s)  The format for your next appointment:   In Person  Provider:   Carlyle Dolly, MD  Other Instructions None      Thank you for choosing [facility] !

## 2019-11-08 NOTE — Progress Notes (Signed)
Virtual Visit via Telephone Note   This visit type was conducted due to national recommendations for restrictions regarding the COVID-19 Pandemic (e.g. social distancing) in an effort to limit this patient's exposure and mitigate transmission in our community.  Due to his co-morbid illnesses, this patient is at least at moderate risk for complications without adequate follow up.  This format is felt to be most appropriate for this patient at this time.  The patient did not have access to video technology/had technical difficulties with video requiring transitioning to audio format only (telephone).  All issues noted in this document were discussed and addressed.  No physical exam could be performed with this format.  Please refer to the patient's chart for his  consent to telehealth for [facility].   Date:  11/08/2019   ID:  Jeremy [name], DOB 1936-07-11, MRN [id number]  Patient Location: Home Provider Location: Office  PCP:  Jani Gravel, MD  Cardiologist:  Dr Carlyle Dolly Electrophysiologist:  None   Evaluation Performed:  Follow-Up Visit  Chief Complaint:  Follow up visit  History of Present Illness:    Jeremy [name] is a 84 y.o. male seen for the following medical problems.   1. CAD - history of prior stenting to LAD - nuclear stress 03/2014 with no ischemia. LVEF 47%.  - admit 04/2016 with chest pain, negative EKG and enzymes. Symptoms better with belching, thoughtGI etiology. He had been off his protonix at the time. Symptoms improved after restarting.     - no recent chest pain. No SOB or DOE - compliant with meds    2. COPD - PFTs 10/2015 with moderate COPD   3. Hyperlipidemia - Has been on zocor for several years 03/2018 TC 129 HDL TG 76 LDL 75  Compliant with statin - labs followed by pcp    4. History of GI bleed - admit 11/2017 with rectal bleeding. EGD showed esophagitis, gastritis but no active bleeding. - no recent issues  5. HTN -  he is compliant with meds   6. 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   The patient does not have symptoms concerning for COVID-19 infection (fever, chills, cough, or new shortness of breath).    Past Medical History:  Diagnosis Date  . Abnormal CXR 4/20011   Chest  CT mode/serere COPD ( no mediastinal abnormality)  . CAD (coronary artery disease) 2005   anterior MI with stent to the LAD in 1991 / stent 1996 / nuclear 2005, no ischemia  . Carotid bruit    Doppler, June, 2013, 0-39% bilateral  . COPD (chronic obstructive pulmonary disease) ([place])   . Diverticula of colon   . Ejection fraction    55-60%, echo, April, 2011, could not estimate RV pressure  . Esophagitis   . HOH (hard of hearing)   . Hyperlipidemia   . Hypertension   . Myocardial infarction ([place])   . Shortness of breath    with exertion  . Sleep apnea    Stop Bang score of 5  . Tobacco abuse    Past Surgical History:  Procedure Laterality Date  . APPENDECTOMY    . BIOPSY  11/17/2017   Procedure: BIOPSY;  Surgeon: Rogene Houston, MD;  [facility];  Service: Endoscopy;;  gastric  . CARDIAC CATHETERIZATION    . CATARACT EXTRACTION  Left   . CATARACT EXTRACTION W/PHACO Right 01/22/2014   Procedure: CATARACT EXTRACTION PHACO AND INTRAOCULAR LENS PLACEMENT (IOC);  Surgeon: Tonny Quashaun Lazalde, MD;  Location: [facility] ORS;  Service: Ophthalmology;  Laterality: Right;  CDE 7.46  . CHOLECYSTECTOMY     8 years ago [facility]  . COLONOSCOPY  10/05/2012   Dr. Laural Golden: multiple diverticula at sigmoid colon, multiple polyps (tubular adenomas), small external hemorrhoids  . COLONOSCOPY N/A 11/18/2017   Procedure: COLONOSCOPY;  Surgeon: Rogene Houston, MD;  [facility];  Service: Endoscopy;  Laterality: N/A;  . CYST REMOVAL HAND     right wrist  . ESOPHAGEAL DILATION N/A 05/23/2015   Procedure:  ESOPHAGEAL DILATION;  Surgeon: Daneil Dolin, MD;  [facility];  Service: Endoscopy;  Laterality: N/A;  . ESOPHAGEAL DILATION N/A 11/17/2017   Procedure: ESOPHAGEAL DILATION;  Surgeon: Rogene Houston, MD;  [facility];  Service: Endoscopy;  Laterality: N/A;  . ESOPHAGOGASTRODUODENOSCOPY N/A 05/23/2015   Dr. Gala Romney: Ulcerative/ erosive reflux esophagitis with peptic stricture formation. Status post dilation as described. Hiatal hernia. Abnormal gastric mucosa of uncertain significance status post gastric biospy. +H.pylori  . ESOPHAGOGASTRODUODENOSCOPY N/A 11/17/2017   Procedure: ESOPHAGOGASTRODUODENOSCOPY (EGD);  Surgeon: Rogene Houston, MD;  [facility];  Service: Endoscopy;  Laterality: N/A;  . PERCUTANEOUS STENT INTERVENTION    . SKIN CANCER EXCISION  2015   ear & neck  . TONSILLECTOMY       Current Meds  Medication Sig  . acetaminophen (TYLENOL) 500 MG tablet Take 500-1,000 mg by mouth daily as needed for moderate pain or headache.  . albuterol (PROVENTIL HFA;VENTOLIN HFA) 108 (90 Base) MCG/ACT inhaler Inhale 1-2 puffs into the lungs every 6 (six) hours as needed for wheezing or shortness of breath.  Marland Kitchen aspirin 81 MG tablet Take 1 tablet (81 mg total) by mouth every evening.  . diphenhydrAMINE (BENADRYL) 25 MG tablet Take 25 mg by mouth at bedtime as needed for sleep.  Marland Kitchen guaiFENesin (MUCINEX) 600 MG 12 hr tablet Take 600 mg by mouth 2 (two) times daily as needed for cough or to loosen phlegm.  Marland Kitchen lisinopril (ZESTRIL) 5 MG tablet TAKE 1 TABLET BY MOUTH DAILY  . loratadine (CLARITIN) 10 MG tablet Take 10 mg by mouth daily.  . nitroGLYCERIN (NITROSTAT) 0.4 MG SL tablet PLACE 1 TABLET UNDER THE TONGUE EVERY 5 MINUTES FOR 3 DOSES AS NEEDED FOR CHEST PAIN (Patient taking differently: Place 0.4 mg under the tongue every 5 (five) minutes as needed for chest pain. )  . OVER THE COUNTER MEDICATION Apply 1 application topically daily as needed (pain). [place] gel FOR BACK  PAIN  . pantoprazole (PROTONIX) 40 MG tablet TAKE 1 TABLET(40 MG) BY MOUTH DAILY  . predniSONE (DELTASONE) 5 MG tablet Label  & dispense according to the schedule below. take 8 Pills PO for 3 days, 6 Pills PO for 3 days, 4 Pills PO for 3 days, 2 Pills PO for 3 days, 1 Pills PO for 3 days, 1/2 Pill  PO for 3 days then STOP. Total 65 pills.  . simvastatin (ZOCOR) 40 MG tablet Take 1 tablet (40 mg total) by mouth every evening.     Allergies:   Patient has no known allergies.   Social History   Tobacco Use  . Smoking status: Former Smoker    Packs/day: 3.00    Years: 12.00    Pack years: 36.00    Types: Cigarettes    Start date:  09/15/1983    Quit date: 09/14/1994    Years since quitting: 25.1  . Smokeless tobacco: Current User    Types: Chew  . Tobacco comment: chews 80% of a pack tobacco per day -- 10/12 doesnt chew too much  Substance Use Topics  . Alcohol use: No    Alcohol/week: 0.0 standard drinks  . Drug use: No     Family Hx: The patient's family history includes Heart disease in his brother, father, and mother; Stomach cancer in his brother. There is no history of Colon cancer.  ROS:   Please see the history of present illness.    All other systems reviewed and are negative.   Prior CV studies:   The following studies were reviewed today:   Labs/Other Tests and Data Reviewed:    EKG:  No ECG reviewed.  Recent Labs: 04/17/2019: B Natriuretic Peptide 12.0 04/19/2019: ALT 14; BUN 28; Creatinine, Ser 0.85; Hemoglobin 13.7; Magnesium 2.1; Platelets 124; Potassium 4.1; Sodium 137   Recent Lipid Panel Lab Results  Component Value Date/Time   CHOL 129 03/25/2018 07:43 AM   TRIG 76 03/25/2018 07:43 AM   HDL 38 (L) 03/25/2018 07:43 AM   CHOLHDL 3.4 03/25/2018 07:43 AM   LDLCALC 75 03/25/2018 07:43 AM    Wt Readings from Last 3 Encounters:  11/08/19 190 lb (86.2 kg)  10/30/19 185 lb (83.9 kg)  04/17/19 168 lb 3.4 oz (76.3 kg)     Objective:    Vital Signs:  Ht 6'  1" (1.854 m)   Wt 190 lb (86.2 kg)   BMI 25.07 kg/m    Normal affect. Normal speech pattern and tone. Comfortable, no apparent distress. No audible signs of SOB or wheezing.   ASSESSMENT & PLAN:    1. CAD - no symptoms, continue current meds  2. Hyperlipidemia - f/u pcp labs when available, continue statin   3. HTN - he will continue current therpay  COVID-19 Education: The signs and symptoms of COVID-19 were discussed with the patient and how to seek care for testing (follow up with PCP or arrange E-visit).  The importance of social distancing was discussed today.  Time:   Today, I have spent 22 minutes with the patient with telehealth technology discussing the above problems.     Medication Adjustments/Labs and Tests Ordered: Current medicines are reviewed at length with the patient today.  Concerns regarding medicines are outlined above.   Tests Ordered: No orders of the defined types were placed in this encounter.   Medication Changes: No orders of the defined types were placed in this encounter.   Follow Up:  F/u 1 year in clinic  Signed, Carlyle Dolly, MD  11/08/2019 9:04 AM

## 2019-11-08 NOTE — Telephone Encounter (Signed)
CSW referred to assist patient with obtaining a BP cuff. CSW contacted patient to inform cuff will be delivered to home. Patient grateful for support and assistance. CSW available as needed. Jackie Jennet Scroggin, LCSW, CCSW-MCS [phone]  

## 2019-11-10 ENCOUNTER — Other Ambulatory Visit: Payer: Self-pay

## 2019-11-10 ENCOUNTER — Ambulatory Visit
Admission: EM | Admit: 2019-11-10 | Discharge: 2019-11-10 | Disposition: A | Discharge status: Home / Self Care | Payer: Medicare Other

## 2019-11-10 DIAGNOSIS — Z4802 Encounter for removal of sutures: Secondary | ICD-10-CM

## 2019-11-10 NOTE — ED Triage Notes (Signed)
Pt here for suture removal ,14 sutures removed from left forearm, skin well approximated and healing

## 2019-12-19 ENCOUNTER — Other Ambulatory Visit: Payer: Self-pay

## 2019-12-19 MED ORDER — PANTOPRAZOLE SODIUM 40 MG PO TBEC: DELAYED_RELEASE_TABLET | ORAL | 1 refills | Status: DC

## 2019-12-19 NOTE — Telephone Encounter (Signed)
Refilled protonix per fax request

## 2020-03-30 ENCOUNTER — Other Ambulatory Visit: Payer: Self-pay | Admitting: Cardiology

## 2020-04-14 ENCOUNTER — Other Ambulatory Visit: Payer: Self-pay | Admitting: Cardiology

## 2020-08-28 ENCOUNTER — Encounter (INDEPENDENT_AMBULATORY_CARE_PROVIDER_SITE_OTHER): Payer: Self-pay | Admitting: Gastroenterology

## 2020-10-02 ENCOUNTER — Other Ambulatory Visit: Payer: Self-pay | Admitting: Cardiology

## 2020-10-25 ENCOUNTER — Other Ambulatory Visit: Payer: Self-pay

## 2020-10-25 MED ORDER — SIMVASTATIN 40 MG PO TABS: ORAL_TABLET | ORAL | 0 refills | Status: DC

## 2020-11-16 ENCOUNTER — Other Ambulatory Visit: Payer: Self-pay

## 2020-11-16 ENCOUNTER — Encounter: Payer: Self-pay | Admitting: Emergency Medicine

## 2020-11-16 ENCOUNTER — Ambulatory Visit
Admission: EM | Admit: 2020-11-16 | Discharge: 2020-11-16 | Disposition: A | Discharge status: Home / Self Care | Payer: Medicare Other | Attending: Emergency Medicine | Admitting: Emergency Medicine

## 2020-11-16 DIAGNOSIS — Z20822 Contact with and (suspected) exposure to covid-19: Secondary | ICD-10-CM

## 2020-11-16 DIAGNOSIS — J441 Chronic obstructive pulmonary disease with (acute) exacerbation: Secondary | ICD-10-CM

## 2020-11-16 DIAGNOSIS — R059 Cough, unspecified: Secondary | ICD-10-CM

## 2020-11-16 MED ORDER — DEXAMETHASONE SODIUM PHOSPHATE 10 MG/ML IJ SOLN
10.0000 mg | Freq: Once | INTRAMUSCULAR | Status: AC
  Administered 2020-11-16: 10 mg via INTRAMUSCULAR

## 2020-11-16 MED ORDER — AZITHROMYCIN 250 MG PO TABS: 250.0000 mg | ORAL_TABLET | Freq: Every day | ORAL | 0 refills | Status: DC

## 2020-11-16 MED ORDER — PREDNISONE 10 MG (21) PO TBPK: ORAL_TABLET | Freq: Every day | ORAL | 0 refills | Status: DC

## 2020-11-16 NOTE — Discharge Instructions (Addendum)
COVID testing ordered.  It will take between 5-7 days for test results.  Someone will contact you regarding abnormal results.    In the meantime: You should remain isolated in your home for 10 days from symptom onset AND greater than 72 hours after symptoms resolution (absence of fever without the use of fever-reducing medication and improvement in respiratory symptoms), whichever is longer Get plenty of rest and push fluids Will covered for COPD exacerbation Steroid shot give in office Prednisone prescribed Azithromycin prescribed Take medications as prescribed and to completion Use OTC medications like ibuprofen or tylenol as needed fever or pain Follow up with PCP for recheck and to ensure symptoms are improving.  Call or go to the ED if you have any new or worsening symptoms such as fever, worsening cough, shortness of breath, chest tightness, chest pain, turning blue, changes in mental status, etc..Marland Kitchen

## 2020-11-16 NOTE — ED Provider Notes (Signed)
[place] [facility]   [id number] 11/16/20 Arrival Time: 2778   CC: COVID symptoms  SUBJECTIVE: History from: patient and family  EIDEN BAGOT is a 85 y.o. male who presents with runny nose, congestion, productive cough and wheezing x few days.  Denies sick exposure to COVID, flu or strep.  Hx significant for COPD.  Has tried OTC medications without relief.  Denies aggravating factors.  Reports previous symptoms in the past with COPD exacerbation.   Denies fever, chills, sore throat, SOB, chest pain, nausea, changes in bowel or bladder habits.     ROS: As per HPI.  All other pertinent ROS negative.     Past Medical History:  Diagnosis Date  . Abnormal CXR 4/20011   Chest  CT mode/serere COPD ( no mediastinal abnormality)  . CAD (coronary artery disease) 2005   anterior MI with stent to the LAD in 1991 / stent 1996 / nuclear 2005, no ischemia  . Carotid bruit    Doppler, June, 2013, 0-39% bilateral  . COPD (chronic obstructive pulmonary disease) ([place])   . Diverticula of colon   . Ejection fraction    55-60%, echo, April, 2011, could not estimate RV pressure  . Esophagitis   . HOH (hard of hearing)   . Hyperlipidemia   . Hypertension   . Myocardial infarction ([place])   . Shortness of breath    with exertion  . Sleep apnea    Stop Bang score of 5  . Tobacco abuse    Past Surgical History:  Procedure Laterality Date  . APPENDECTOMY    . BIOPSY  11/17/2017   Procedure: BIOPSY;  Surgeon: Rogene Houston, MD;  [facility];  Service: Endoscopy;;  gastric  . CARDIAC CATHETERIZATION    . CATARACT EXTRACTION Left   . CATARACT EXTRACTION W/PHACO Right 01/22/2014   Procedure: CATARACT EXTRACTION PHACO AND INTRAOCULAR LENS PLACEMENT (IOC);  Surgeon: Tonny Branch, MD;  Location: [facility];  Service: Ophthalmology;  Laterality: Right;  CDE 7.46  . CHOLECYSTECTOMY     8 years ago [facility]  . COLONOSCOPY  10/05/2012   Dr. Laural Golden: multiple diverticula at sigmoid colon, multiple polyps  (tubular adenomas), small external hemorrhoids  . COLONOSCOPY N/A 11/18/2017   Procedure: COLONOSCOPY;  Surgeon: Rogene Houston, MD;  [facility];  Service: Endoscopy;  Laterality: N/A;  . CYST REMOVAL HAND     right wrist  . ESOPHAGEAL DILATION N/A 05/23/2015   Procedure: ESOPHAGEAL DILATION;  Surgeon: Daneil Dolin, MD;  [facility];  Service: Endoscopy;  Laterality: N/A;  . ESOPHAGEAL DILATION N/A 11/17/2017   Procedure: ESOPHAGEAL DILATION;  Surgeon: Rogene Houston, MD;  [facility];  Service: Endoscopy;  Laterality: N/A;  . ESOPHAGOGASTRODUODENOSCOPY N/A 05/23/2015   Dr. Gala Romney: Ulcerative/ erosive reflux esophagitis with peptic stricture formation. Status post dilation as described. Hiatal hernia. Abnormal gastric mucosa of uncertain significance status post gastric biospy. +H.pylori  . ESOPHAGOGASTRODUODENOSCOPY N/A 11/17/2017   Procedure: ESOPHAGOGASTRODUODENOSCOPY (EGD);  Surgeon: Rogene Houston, MD;  [facility];  Service: Endoscopy;  Laterality: N/A;  . PERCUTANEOUS STENT INTERVENTION    . SKIN CANCER EXCISION  2015   ear & neck  . TONSILLECTOMY     No Known Allergies No current facility-administered medications on file prior to encounter.   Current Outpatient Medications on File Prior to Encounter  Medication Sig Dispense Refill  . acetaminophen (TYLENOL) 500 MG tablet Take 500-1,000 mg by mouth daily as needed  for moderate pain or headache.    . albuterol (PROVENTIL HFA;VENTOLIN HFA) 108 (90 Base) MCG/ACT inhaler Inhale 1-2 puffs into the lungs every 6 (six) hours as needed for wheezing or shortness of breath.    Marland Kitchen aspirin 81 MG tablet Take 1 tablet (81 mg total) by mouth every evening.    . diphenhydrAMINE (BENADRYL) 25 MG tablet Take 25 mg by mouth at bedtime as needed for sleep.    Marland Kitchen guaiFENesin (MUCINEX) 600 MG 12 hr tablet Take 600 mg by mouth 2 (two) times daily as needed for cough or to loosen phlegm.    Marland Kitchen lisinopril  (ZESTRIL) 5 MG tablet TAKE 1 TABLET BY MOUTH DAILY 90 tablet 3  . loratadine (CLARITIN) 10 MG tablet Take 10 mg by mouth daily.    . nitroGLYCERIN (NITROSTAT) 0.4 MG SL tablet PLACE 1 TABLET UNDER THE TONGUE EVERY 5 MINUTES FOR 3 DOSES AS NEEDED FOR CHEST PAIN (Patient taking differently: Place 0.4 mg under the tongue every 5 (five) minutes as needed for chest pain. ) 75 tablet 3  . OVER THE COUNTER MEDICATION Apply 1 application topically daily as needed (pain). [place] gel FOR BACK PAIN    . pantoprazole (PROTONIX) 40 MG tablet TAKE 1 TABLET(40 MG) BY MOUTH DAILY 90 tablet 1  . simvastatin (ZOCOR) 40 MG tablet TAKE 1 TABLET(40 MG) BY MOUTH EVERY EVENING 30 tablet 0   Social History   Socioeconomic History  . Marital status: Married    Spouse name: Not on file  . Number of children: Not on file  . Years of education: Not on file  . Highest education level: Not on file  Occupational History  . Not on file  Tobacco Use  . Smoking status: Former Smoker    Packs/day: 3.00    Years: 12.00    Pack years: 36.00    Types: Cigarettes    Start date: 09/15/1983    Quit date: 09/14/1994    Years since quitting: 26.1  . Smokeless tobacco: Current User    Types: Chew  . Tobacco comment: chews 80% of a pack tobacco per day -- 10/12 doesnt chew too much  Vaping Use  . Vaping Use: Never used  Substance and Sexual Activity  . Alcohol use: No    Alcohol/week: 0.0 standard drinks  . Drug use: No  . Sexual activity: Yes    Partners: Female  Other Topics Concern  . Not on file  Social History Narrative  . Not on file   Social Determinants of Health   Financial Resource Strain: Not on file  Food Insecurity: Not on file  Transportation Needs: Not on file  Physical Activity: Not on file  Stress: Not on file  Social Connections: Not on file  Intimate Partner Violence: Not on file   Family History  Problem Relation Age of Onset  . Heart disease Mother   . Heart disease Father   . Heart  disease Brother   . Stomach cancer Brother   . Colon cancer Neg Hx     OBJECTIVE:  Vitals:   11/16/20 1434  BP: (!) 147/88  Pulse: 74  Resp: 18  Temp: 98.7 F (37.1 C)  TempSrc: Oral  SpO2: 93%     General appearance: alert; well-appearing, nontoxic; speaking in full sentences and tolerating own secretions HEENT: NCAT; Ears: EACs clear, TMs pearly gray; Eyes: PERRL.  EOM grossly intact. Nose: nares patent without rhinorrhea, Throat: oropharynx clear, tonsils non erythematous or enlarged, uvula midline  Neck: supple without LAD Lungs: unlabored respirations, symmetrical air entry; cough: absent; no respiratory distress; subtle expiratory wheezes Heart: regular rate and rhythm. Skin: warm and dry Psychological: alert and cooperative; normal mood and affect  ASSESSMENT & PLAN:  1. COPD exacerbation ([place])   2. Cough     Meds ordered this encounter  Medications  . dexamethasone (DECADRON) injection 10 mg  . predniSONE (STERAPRED UNI-PAK 21 TAB) 10 MG (21) TBPK tablet    Sig: Take by mouth daily. Take 6 tabs by mouth daily  for 2 days, then 5 tabs for 2 days, then 4 tabs for 2 days, then 3 tabs for 2 days, 2 tabs for 2 days, then 1 tab by mouth daily for 2 days    Dispense:  42 tablet    Refill:  0    Order Specific Question:   Supervising Provider    Answer:   Raylene Everts [[id number]]  . azithromycin (ZITHROMAX) 250 MG tablet    Sig: Take 1 tablet (250 mg total) by mouth daily. Take first 2 tablets together, then 1 every day until finished.    Dispense:  6 tablet    Refill:  0    Order Specific Question:   Supervising Provider    Answer:   Raylene Everts [[id number]]   COVID testing ordered.  It will take between 5-7 days for test results.  Someone will contact you regarding abnormal results.    In the meantime: You should remain isolated in your home for 10 days from symptom onset AND greater than 72 hours after symptoms resolution (absence of fever without the use  of fever-reducing medication and improvement in respiratory symptoms), whichever is longer Get plenty of rest and push fluids Will covered for COPD exacerbation Steroid shot give in office Prednisone prescribed Azithromycin prescribed Take medications as prescribed and to completion Use OTC medications like ibuprofen or tylenol as needed fever or pain Follow up with PCP for recheck and to ensure symptoms are improving.  Call or go to the ED if you have any new or worsening symptoms such as fever, worsening cough, shortness of breath, chest tightness, chest pain, turning blue, changes in mental status, etc...   Reviewed expectations re: course of current medical issues. Questions answered. Outlined signs and symptoms indicating need for more acute intervention. Patient verbalized understanding. After Visit Summary given.         Lestine Box, PA-C 11/16/20 1523

## 2020-11-16 NOTE — ED Triage Notes (Signed)
Eyes watering, sinus congestion, and sneezing for the past several days.

## 2020-11-17 LAB — COVID-19, FLU A+B NAA
Influenza A, NAA: NOT DETECTED
Influenza B, NAA: NOT DETECTED
SARS-CoV-2, NAA: NOT DETECTED

## 2020-11-21 ENCOUNTER — Ambulatory Visit (INDEPENDENT_AMBULATORY_CARE_PROVIDER_SITE_OTHER): Payer: Medicare Other | Admitting: Gastroenterology

## 2020-11-25 ENCOUNTER — Other Ambulatory Visit: Payer: Self-pay | Admitting: Cardiology

## 2020-11-28 ENCOUNTER — Encounter (INDEPENDENT_AMBULATORY_CARE_PROVIDER_SITE_OTHER): Payer: Self-pay | Admitting: *Deleted

## 2020-12-07 ENCOUNTER — Other Ambulatory Visit: Payer: Self-pay | Admitting: Cardiology

## 2020-12-11 ENCOUNTER — Encounter: Payer: Self-pay | Admitting: *Deleted

## 2020-12-11 ENCOUNTER — Ambulatory Visit (INDEPENDENT_AMBULATORY_CARE_PROVIDER_SITE_OTHER): Payer: Medicare Other | Admitting: Cardiology

## 2020-12-11 ENCOUNTER — Encounter: Payer: Self-pay | Admitting: Cardiology

## 2020-12-11 VITALS — BP 130/80 | HR 66 | Ht 73.0 in | Wt 183.6 lb

## 2020-12-11 DIAGNOSIS — I2510 Atherosclerotic heart disease of native coronary artery without angina pectoris: Secondary | ICD-10-CM

## 2020-12-11 DIAGNOSIS — I10 Essential (primary) hypertension: Secondary | ICD-10-CM

## 2020-12-11 DIAGNOSIS — E782 Mixed hyperlipidemia: Secondary | ICD-10-CM | POA: Diagnosis not present

## 2020-12-11 NOTE — Patient Instructions (Signed)

## 2020-12-11 NOTE — Progress Notes (Signed)
Clinical Summary Jeremy [name] is a 85 y.o.male seen for the following medical problems.   1. CAD - history of prior stenting to LAD - nuclear stress 03/2014 with no ischemia. LVEF 47%.  - admit 04/2016 with chest pain, negative EKG and enzymes. Symptoms better with belching, thoughtGI etiology. He had been off his protonix at the time. Symptoms improved after restarting.     - no recent chest pain - no SOb/DOE - compliant with meds    2. COPD - PFTs 10/2015 with moderate COPD   3. Hyperlipidemia - Has been on zocor for several years 03/2018 TC 129 HDL TG 76 LDL 75  - labs followed by pcp - he is compliant with statin    4. History of GI bleed - admit 11/2017 with rectal bleeding. EGD showed esophagitis, gastritis but no active bleeding. - no recent issues  5. HTN - compliant with meds   6. 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 Abnormal CXR 4/20011   Chest  CT mode/serere COPD ( no mediastinal abnormality)  . CAD (coronary artery disease) 2005   anterior MI with stent to the LAD in 1991 / stent 1996 / nuclear 2005, no ischemia  . Carotid bruit    Doppler, June, 2013, 0-39% bilateral  . COPD (chronic obstructive pulmonary disease) ([place])   . Diverticula of colon   . Ejection fraction    55-60%, echo, April, 2011, could not estimate RV pressure  . Esophagitis   . HOH (hard of hearing)   . Hyperlipidemia   . Hypertension   . Myocardial infarction ([place])   . Shortness of breath    with exertion  . Sleep apnea    Stop Bang score of 5  . Tobacco abuse      No Known Allergies   Current Outpatient Medications  Medication Sig Dispense Refill  . acetaminophen (TYLENOL) 500 MG tablet Take 500-1,000 mg by mouth daily as needed for  moderate pain or headache.    . albuterol (PROVENTIL HFA;VENTOLIN HFA) 108 (90 Base) MCG/ACT inhaler Inhale 1-2 puffs into the lungs every 6 (six) hours as needed for wheezing or shortness of breath.    Marland Kitchen aspirin 81 MG tablet Take 1 tablet (81 mg total) by mouth every evening.    Marland Kitchen azithromycin (ZITHROMAX) 250 MG tablet Take 1 tablet (250 mg total) by mouth daily. Take first 2 tablets together, then 1 every day until finished. 6 tablet 0  . diphenhydrAMINE (BENADRYL) 25 MG tablet Take 25 mg by mouth at bedtime as needed for sleep.    Marland Kitchen guaiFENesin (MUCINEX) 600 MG 12 hr tablet Take 600 mg by mouth 2 (two) times daily as needed for cough or to loosen phlegm.    Marland Kitchen lisinopril (ZESTRIL) 5 MG tablet TAKE 1 TABLET BY MOUTH DAILY 90 tablet 3  . loratadine (CLARITIN) 10 MG tablet Take 10 mg by mouth daily.    . nitroGLYCERIN (NITROSTAT) 0.4 MG SL tablet PLACE 1 TABLET UNDER THE TONGUE EVERY 5 MINUTES FOR 3 DOSES AS NEEDED FOR CHEST PAIN (Patient taking differently: Place 0.4 mg under the tongue every 5 (five) minutes as needed for chest pain. ) 75 tablet 3  . OVER THE COUNTER MEDICATION Apply 1  application topically daily as needed (pain). Jeremy [name] FOR BACK PAIN    . pantoprazole (PROTONIX) 40 MG tablet TAKE 1 TABLET(40 MG) BY MOUTH DAILY 90 tablet 1  . predniSONE (STERAPRED UNI-PAK 21 TAB) 10 MG (21) TBPK tablet Take by mouth daily. Take 6 tabs by mouth daily  for 2 days, then 5 tabs for 2 days, then 4 tabs for 2 days, then 3 tabs for 2 days, 2 tabs for 2 days, then 1 tab by mouth daily for 2 days 42 tablet 0  . simvastatin (ZOCOR) 40 MG tablet TAKE 1 TABLET(40 MG) BY MOUTH EVERY EVENING 60 tablet 0   No current facility-administered medications for this visit.     Past Surgical History:  Procedure Laterality Date  . APPENDECTOMY    . BIOPSY  11/17/2017   Procedure: BIOPSY;  Surgeon: Jeremy Houston, MD;  [facility];  Service: Endoscopy;;  gastric  . CARDIAC CATHETERIZATION    .  CATARACT EXTRACTION Left   . CATARACT EXTRACTION W/PHACO Right 01/22/2014   Procedure: CATARACT EXTRACTION PHACO AND INTRAOCULAR LENS PLACEMENT (IOC);  Surgeon: Jeremy Arlo Buffone, MD;  Location: [facility];  Service: Ophthalmology;  Laterality: Right;  CDE 7.46  . CHOLECYSTECTOMY     8 years ago [facility]  . COLONOSCOPY  10/05/2012   Dr. Laural [name]: multiple diverticula at sigmoid colon, multiple polyps (tubular adenomas), small external hemorrhoids  . COLONOSCOPY N/A 11/18/2017   Procedure: COLONOSCOPY;  Surgeon: Jeremy Houston, MD;  [facility];  Service: Endoscopy;  Laterality: N/A;  . CYST REMOVAL HAND     right wrist  . ESOPHAGEAL DILATION N/A 05/23/2015   Procedure: ESOPHAGEAL DILATION;  Surgeon: Jeremy Dolin, MD;  [facility];  Service: Endoscopy;  Laterality: N/A;  . ESOPHAGEAL DILATION N/A 11/17/2017   Procedure: ESOPHAGEAL DILATION;  Surgeon: Jeremy Houston, MD;  [facility];  Service: Endoscopy;  Laterality: N/A;  . ESOPHAGOGASTRODUODENOSCOPY N/A 05/23/2015   Dr. Gala [name]: Ulcerative/ erosive reflux esophagitis with peptic stricture formation. Status post dilation as described. Hiatal hernia. Abnormal gastric mucosa of uncertain significance status post gastric biospy. +H.pylori  . ESOPHAGOGASTRODUODENOSCOPY N/A 11/17/2017   Procedure: ESOPHAGOGASTRODUODENOSCOPY (EGD);  Surgeon: Jeremy Houston, MD;  [facility];  Service: Endoscopy;  Laterality: N/A;  . PERCUTANEOUS STENT INTERVENTION    . SKIN CANCER EXCISION  2015   ear & neck  . TONSILLECTOMY       No Known Allergies    Family History  Problem Relation Age of Onset  . Heart disease Mother   . Heart disease Father   . Heart disease Brother   . Stomach cancer Brother   . Colon cancer Neg Hx      Social History Jeremy [name] reports that he quit smoking about 26 years ago. His smoking use included cigarettes. He started smoking about 37 years ago. He has a 36.00 pack-year smoking history. His  smokeless tobacco use includes chew. Jeremy [name] reports no history of alcohol use.   Review of Systems CONSTITUTIONAL: No weight loss, fever, chills, weakness or fatigue.  HEENT: Eyes: No visual loss, blurred vision, double vision or yellow sclerae.No hearing loss, sneezing, congestion, runny nose or sore throat.  SKIN: No rash or itching.  CARDIOVASCULAR: per hpi RESPIRATORY: No shortness of breath, cough or sputum.  GASTROINTESTINAL: No anorexia, nausea, vomiting or diarrhea. No abdominal pain or blood.  GENITOURINARY: No burning on urination, no polyuria NEUROLOGICAL: No headache, dizziness, syncope, paralysis,  ataxia, numbness or tingling in the extremities. No change in bowel or bladder control.  MUSCULOSKELETAL: No muscle, back pain, joint pain or stiffness.  LYMPHATICS: No enlarged nodes. No history of splenectomy.  PSYCHIATRIC: No history of depression or anxiety.  ENDOCRINOLOGIC: No reports of sweating, cold or heat intolerance. No polyuria or polydipsia.  Marland Kitchen   Physical Examination Today's Vitals   12/11/20 0822  BP: 130/80  Pulse: 66  SpO2: 97%  Weight: 183 lb 9.6 oz (83.3 kg)  Height: 6\' 1"  (1.854 m)   Body mass index is 24.22 kg/m.  Gen: resting comfortably, no acute distress HEENT: no scleral icterus, pupils equal round and reactive, no palptable cervical adenopathy,  CV: RRR, no m/r/g, no jvd Resp: Clear to auscultation bilaterally GI: abdomen is soft, non-tender, non-distended, normal bowel sounds, no hepatosplenomegaly MSK: extremities are warm, no edema.  Skin: warm, no rash Neuro:  no focal deficits Psych: appropriate affect   Diagnostic Studies     Assessment and Plan  1. CAD -doing well without symptoms, continue current meds EKG today shows SR, no ischemic changes  2. Hyperlipidemia -request pcp labs, continue statin   3. HTN - bp at goal, continue current meds     Arnoldo Lenis, M.D.

## 2021-01-03 ENCOUNTER — Other Ambulatory Visit: Payer: Self-pay | Admitting: Cardiology

## 2021-01-13 ENCOUNTER — Other Ambulatory Visit: Payer: Self-pay

## 2021-01-13 ENCOUNTER — Telehealth (INDEPENDENT_AMBULATORY_CARE_PROVIDER_SITE_OTHER): Payer: Self-pay

## 2021-01-13 ENCOUNTER — Encounter (INDEPENDENT_AMBULATORY_CARE_PROVIDER_SITE_OTHER): Payer: Self-pay

## 2021-01-13 ENCOUNTER — Other Ambulatory Visit (INDEPENDENT_AMBULATORY_CARE_PROVIDER_SITE_OTHER): Payer: Self-pay

## 2021-01-13 ENCOUNTER — Encounter (INDEPENDENT_AMBULATORY_CARE_PROVIDER_SITE_OTHER): Payer: Self-pay | Admitting: Gastroenterology

## 2021-01-13 ENCOUNTER — Ambulatory Visit (INDEPENDENT_AMBULATORY_CARE_PROVIDER_SITE_OTHER): Payer: Medicare Other | Admitting: Gastroenterology

## 2021-01-13 VITALS — BP 133/83 | HR 74 | Temp 98.0°F | Ht 73.0 in | Wt 181.0 lb

## 2021-01-13 DIAGNOSIS — K625 Hemorrhage of anus and rectum: Secondary | ICD-10-CM | POA: Diagnosis not present

## 2021-01-13 MED ORDER — SUTAB 1479-225-188 MG PO TABS: 1.0000 | ORAL_TABLET | Freq: Once | ORAL | 0 refills | Status: AC

## 2021-01-13 NOTE — Patient Instructions (Signed)
Schedule colonoscopy

## 2021-01-13 NOTE — Telephone Encounter (Signed)
LeighAnn Avery [place]be[place], CMA  

## 2021-01-13 NOTE — Progress Notes (Addendum)
Jeremy [name], M.D. Gastroenterology & Hepatology [facility] [address] [place], [place] [address]  Primary Care Physician: Celene Squibb, MD [place] [id number]  I will communicate my assessment and recommendations to the referring MD via EMR.  Problems: 1. Rectal bleeding 2. History of H. pylori  History of Present Illness: Jeremy [name] is a 85 y.o. male with past medical history of coronary artery disease status post stent placement, diverticulosis with episode of diverticular bleeding, hyperlipidemia, hypertension, OSA, COPD, who presents for follow up of rectal bleeding.  The patient was last seen while hospitalized on 11/16/2017. At that time, the patient underwent an EGD and colonoscopy for evaluation of gastrointestinal bleeding.  Esophagogastroduodenospy on 11/17/2017 showed grade B esophagitis, presence of stricture at 39 cm from the incisors which was dilated up to 18 mm with a balloon, 2 cm hiatal hernia, gastritis in the antrum (pathology showed chronic active gastritis with epiglottic pylori), normal duodenum.  Colonoscopy Next day showed presence of diverticulosis and internal hemorrhoids but preparation overall was poor.  However given absence of active bleeding, it was considered that the bleeding was secondary to diverticular bleeding.  Patient was discharged home with an order to have a repeat colonoscopy in 3 months due to poor prep.  He was also sent prescription for Pylera.  Patient comes with his daughter to the appointment.  She states that the patient had COVID 19 in July 4 th 2020 and had to cancel his colonoscopy.  He did not reschedule this procedure afterwards.  States that 5 months ago he had some episodes of rectal bleeding for a couple a days.  The episode was self-limited.  He denies having any dyschezia, nausea, vomiting, fever, chills, melena, hematemesis, abdominal distention,  abdominal pain, diarrhea, jaundice, pruritus or weight loss.  Denies intake of NSAIDs, anticoagulants, high dose aspirin, or any other antiplatelet.  He has been taking his pantoprazole compliantly, he denies having any heartburn with this.  Past Medical History: Past Medical History:  Diagnosis Date  . Abnormal CXR 4/20011   Chest  CT mode/serere COPD ( no mediastinal abnormality)  . CAD (coronary artery disease) 2005   anterior MI with stent to the LAD in 1991 / stent 1996 / nuclear 2005, no ischemia  . Carotid bruit    Doppler, June, 2013, 0-39% bilateral  . COPD (chronic obstructive pulmonary disease) ([place])   . Diverticula of colon   . Ejection fraction    55-60%, echo, April, 2011, could not estimate RV pressure  . Esophagitis   . HOH (hard of hearing)   . Hyperlipidemia   . Hypertension   . Myocardial infarction ([place])   . Shortness of breath    with exertion  . Sleep apnea    Stop Bang score of 5  . Tobacco abuse     Past Surgical History: Past Surgical History:  Procedure Laterality Date  . APPENDECTOMY    . BIOPSY  11/17/2017   Procedure: BIOPSY;  Surgeon: Rogene Houston, MD;  [facility];  Service: Endoscopy;;  gastric  . CARDIAC CATHETERIZATION    . CATARACT EXTRACTION Left   . CATARACT EXTRACTION W/PHACO Right 01/22/2014   Procedure: CATARACT EXTRACTION PHACO AND INTRAOCULAR LENS PLACEMENT (IOC);  Surgeon: Tonny Branch, MD;  Location: [facility];  Service: Ophthalmology;  Laterality: Right;  CDE 7.46  . CHOLECYSTECTOMY     8 years ago [facility]  . COLONOSCOPY  10/05/2012  Dr. Laural Golden: multiple diverticula at sigmoid colon, multiple polyps (tubular adenomas), small external hemorrhoids  . COLONOSCOPY N/A 11/18/2017   Procedure: COLONOSCOPY;  Surgeon: Rogene Houston, MD;  [facility];  Service: Endoscopy;  Laterality: N/A;  . CYST REMOVAL HAND     right wrist  . ESOPHAGEAL DILATION N/A 05/23/2015   Procedure: ESOPHAGEAL DILATION;  Surgeon: Daneil Dolin, MD;  [facility];  Service: Endoscopy;  Laterality: N/A;  . ESOPHAGEAL DILATION N/A 11/17/2017   Procedure: ESOPHAGEAL DILATION;  Surgeon: Rogene Houston, MD;  [facility];  Service: Endoscopy;  Laterality: N/A;  . ESOPHAGOGASTRODUODENOSCOPY N/A 05/23/2015   Dr. Gala Romney: Ulcerative/ erosive reflux esophagitis with peptic stricture formation. Status post dilation as described. Hiatal hernia. Abnormal gastric mucosa of uncertain significance status post gastric biospy. +H.pylori  . ESOPHAGOGASTRODUODENOSCOPY N/A 11/17/2017   Procedure: ESOPHAGOGASTRODUODENOSCOPY (EGD);  Surgeon: Rogene Houston, MD;  [facility];  Service: Endoscopy;  Laterality: N/A;  . PERCUTANEOUS STENT INTERVENTION    . SKIN CANCER EXCISION  2015   ear & neck  . TONSILLECTOMY      Family History: Family History  Problem Relation Age of Onset  . Heart disease Mother   . Heart disease Father   . Heart disease Brother   . Stomach cancer Brother   . Colon cancer Neg Hx     Social History: Social History   Tobacco Use  Smoking Status Former Smoker  . Packs/day: 3.00  . Years: 12.00  . Pack years: 36.00  . Types: Cigarettes  . Start date: 09/15/1983  . Quit date: 09/14/1994  . Years since quitting: 26.3  Smokeless Tobacco Current User  . Types: Chew  Tobacco Comment   chews 80% of a pack tobacco per day -- 10/12 doesnt chew too much   Social History   Substance and Sexual Activity  Alcohol Use No  . Alcohol/week: 0.0 standard drinks   Social History   Substance and Sexual Activity  Drug Use No    Allergies: No Known Allergies  Medications: Current Outpatient Medications  Medication Sig Dispense Refill  . acetaminophen (TYLENOL) 500 MG tablet Take 500-1,000 mg by mouth daily as needed for moderate pain or headache.    . albuterol (PROVENTIL HFA;VENTOLIN HFA) 108 (90 Base) MCG/ACT inhaler Inhale 1-2 puffs into the lungs every 6 (six) hours as needed for wheezing  or shortness of breath.    Marland Kitchen aspirin 81 MG tablet Take 1 tablet (81 mg total) by mouth every evening.    . diclofenac Sodium (VOLTAREN) 1 % GEL Apply 4 g topically 4 (four) times daily as needed.    . diphenhydrAMINE (BENADRYL) 25 MG tablet Take 25 mg by mouth at bedtime as needed for sleep.    Marland Kitchen guaiFENesin (MUCINEX) 600 MG 12 hr tablet Take 600 mg by mouth 2 (two) times daily as needed for cough or to loosen phlegm.    Marland Kitchen lisinopril (ZESTRIL) 5 MG tablet TAKE 1 TABLET BY MOUTH DAILY 90 tablet 3  . loratadine (CLARITIN) 10 MG tablet Take 10 mg by mouth daily as needed.    . nitroGLYCERIN (NITROSTAT) 0.4 MG SL tablet PLACE 1 TABLET UNDER THE TONGUE EVERY 5 MINUTES FOR 3 DOSES AS NEEDED FOR CHEST PAIN (Patient taking differently: Place 0.4 mg under the tongue every 5 (five) minutes as needed for chest pain.) 75 tablet 3  . OVER THE COUNTER MEDICATION Apply 1 application topically daily as needed (pain). [place]  gel FOR BACK PAIN    . pantoprazole (PROTONIX) 40 MG tablet TAKE 1 TABLET(40 MG) BY MOUTH DAILY 90 tablet 1  . simvastatin (ZOCOR) 40 MG tablet TAKE 1 TABLET(40 MG) BY MOUTH EVERY EVENING 60 tablet 0  . vitamin B-12 (CYANOCOBALAMIN) 500 MCG tablet Take 500 mcg by mouth daily.     No current facility-administered medications for this visit.    Review of Systems: GENERAL: negative for malaise, night sweats HEENT: No changes in hearing or vision, no nose bleeds or other nasal problems. NECK: Negative for lumps, goiter, pain and significant neck swelling RESPIRATORY: Negative for cough, wheezing CARDIOVASCULAR: Negative for chest pain, leg swelling, palpitations, orthopnea GI: SEE HPI MUSCULOSKELETAL: Negative for joint pain or swelling, back pain, and muscle pain. SKIN: Negative for lesions, rash PSYCH: Negative for sleep disturbance, mood disorder and recent psychosocial stressors. HEMATOLOGY Negative for prolonged bleeding, bruising easily, and swollen nodes. ENDOCRINE: Negative for cold  or heat intolerance, polyuria, polydipsia and goiter. NEURO: negative for tremor, gait imbalance, syncope and seizures. The remainder of the review of systems is noncontributory.   Physical Exam: BP 133/83 (BP Location: Left Arm, Patient Position: Sitting, Cuff Size: Large)   Pulse 74   Temp 98 F (36.7 C) (Oral)   Ht 6\' 1"  (1.854 m)   Wt 181 lb (82.1 kg)   BMI 23.88 kg/m  GENERAL: The patient is AO x3, in no acute distress. HEENT: Head is normocephalic and atraumatic. EOMI are intact. Mouth is well hydrated and without lesions. NECK: Supple. No masses LUNGS: Clear to auscultation. No presence of rhonchi/wheezing/rales. Adequate chest expansion HEART: RRR, normal s1 and s2. ABDOMEN: Soft, nontender, no guarding, no peritoneal signs, and nondistended. BS +. No masses. EXTREMITIES: Without any cyanosis, clubbing, rash, lesions or edema. NEUROLOGIC: AOx3, no focal motor deficit. SKIN: no jaundice, no rashes  Imaging/Labs: as above  I personally reviewed and interpreted the available labs, imaging and endoscopic files.  Impression and Plan: Jeremy [name] is a 85 y.o. male with past medical history of coronary artery disease status post stent placement, diverticulosis with episode of diverticular bleeding, hyperlipidemia, hypertension, OSA, COPD, who presents for follow up of rectal bleeding.  Patient had a self-limited episode of lower gastrointestinal bleeding, which is possibly related to recurrent diverticular bleeding as he has not presented any red flag signs otherwise and had self-limited episode.  Nevertheless, given the poor prep he had before, we will schedule a repeat colonoscopy.  The patient understood and agreed.  - Schedule colonoscopy  All questions were answered.      Harvel Quale, MD Gastroenterology and Hepatology [facility]

## 2021-01-21 ENCOUNTER — Ambulatory Visit (INDEPENDENT_AMBULATORY_CARE_PROVIDER_SITE_OTHER): Payer: Medicare Other | Admitting: Gastroenterology

## 2021-01-22 ENCOUNTER — Other Ambulatory Visit: Payer: Self-pay | Admitting: Cardiology

## 2021-01-29 DIAGNOSIS — E539 Vitamin B deficiency, unspecified: Secondary | ICD-10-CM | POA: Diagnosis not present

## 2021-01-29 DIAGNOSIS — Z125 Encounter for screening for malignant neoplasm of prostate: Secondary | ICD-10-CM | POA: Diagnosis not present

## 2021-01-29 DIAGNOSIS — I10 Essential (primary) hypertension: Secondary | ICD-10-CM | POA: Diagnosis not present

## 2021-01-29 DIAGNOSIS — E782 Mixed hyperlipidemia: Secondary | ICD-10-CM | POA: Diagnosis not present

## 2021-02-03 DIAGNOSIS — K219 Gastro-esophageal reflux disease without esophagitis: Secondary | ICD-10-CM | POA: Diagnosis not present

## 2021-02-03 DIAGNOSIS — K625 Hemorrhage of anus and rectum: Secondary | ICD-10-CM | POA: Diagnosis not present

## 2021-02-03 DIAGNOSIS — E782 Mixed hyperlipidemia: Secondary | ICD-10-CM | POA: Diagnosis not present

## 2021-02-03 DIAGNOSIS — J302 Other seasonal allergic rhinitis: Secondary | ICD-10-CM | POA: Diagnosis not present

## 2021-02-03 DIAGNOSIS — M25511 Pain in right shoulder: Secondary | ICD-10-CM | POA: Diagnosis not present

## 2021-02-03 DIAGNOSIS — E539 Vitamin B deficiency, unspecified: Secondary | ICD-10-CM | POA: Diagnosis not present

## 2021-02-03 DIAGNOSIS — I25729 Atherosclerosis of autologous artery coronary artery bypass graft(s) with unspecified angina pectoris: Secondary | ICD-10-CM | POA: Diagnosis not present

## 2021-02-03 DIAGNOSIS — I10 Essential (primary) hypertension: Secondary | ICD-10-CM | POA: Diagnosis not present

## 2021-02-03 DIAGNOSIS — F17220 Nicotine dependence, chewing tobacco, uncomplicated: Secondary | ICD-10-CM | POA: Diagnosis not present

## 2021-02-03 DIAGNOSIS — R779 Abnormality of plasma protein, unspecified: Secondary | ICD-10-CM | POA: Diagnosis not present

## 2021-02-03 DIAGNOSIS — J449 Chronic obstructive pulmonary disease, unspecified: Secondary | ICD-10-CM | POA: Diagnosis not present

## 2021-02-11 NOTE — Patient Instructions (Signed)
Jeremy [name]  02/11/2021     @PREFPERIOPPHARMACY @   Your procedure is scheduled on  02/14/2021.   Report to [facility] at  [phone]  A.M.   Call this number if you have problems the morning of surgery:  [phone]   Remember:  Follow the diet and prep instructions given to you by the office.                     Take these medicines the morning of surgery with A SIP OF WATER  Claritin, protonix.     Please brush your teeth.  Do not wear jewelry, make-up or nail polish.  Do not wear lotions, powders, or perfumes, or deodorant.  Do not shave 48 hours prior to surgery.  Men may shave face and neck.  Do not bring valuables to the hospital.  [facility] is not responsible for any belongings or valuables.  Contacts, dentures or bridgework may not be worn into surgery.  Leave your suitcase in the car.  After surgery it may be brought to your room.  For patients admitted to the hospital, discharge time will be determined by your treatment team.  Patients discharged the day of surgery will not be allowed to drive home and must have someone with them for 24 hours.    Special instructions:  DO NOT smoke tobacco or vape for 24 hours before your procedure.  Please read over the following fact sheets that you were given. Anesthesia Post-op Instructions and Care and Recovery After Surgery       Colonoscopy, Adult, Care After This sheet gives you information about how to care for yourself after your procedure. Your health care provider may also give you more specific instructions. If you have problems or questions, contact your health care provider. What can I expect after the procedure? After the procedure, it is common to have:  A small amount of blood in your stool for 24 hours after the procedure.  Some gas.  Mild cramping or bloating of your abdomen. Follow these instructions at home: Eating and drinking  Drink enough fluid to keep your urine pale yellow.  Follow  instructions from your health care provider about eating or drinking restrictions.  Resume your normal diet as instructed by your health care provider. Avoid heavy or fried foods that are hard to digest.   Activity  Rest as told by your health care provider.  Avoid sitting for a long time without moving. Get up to take short walks every 1-2 hours. This is important to improve blood flow and breathing. Ask for help if you feel weak or unsteady.  Return to your normal activities as told by your health care provider. Ask your health care provider what activities are safe for you. Managing cramping and bloating  Try walking around when you have cramps or feel bloated.  Apply heat to your abdomen as told by your health care provider. Use the heat source that your health care provider recommends, such as a moist heat pack or a heating pad. ? Place a towel between your skin and the heat source. ? Leave the heat on for 20-30 minutes. ? Remove the heat if your skin turns bright red. This is especially important if you are unable to feel pain, heat, or cold. You may have a greater risk of getting burned.   General instructions  If you were given a sedative during the procedure, it can affect  you for several hours. Do not drive or operate machinery until your health care provider says that it is safe.  For the first 24 hours after the procedure: ? Do not sign important documents. ? Do not drink alcohol. ? Do your regular daily activities at a slower pace than normal. ? Eat soft foods that are easy to digest.  Take over-the-counter and prescription medicines only as told by your health care provider.  Keep all follow-up visits as told by your health care provider. This is important. Contact a health care provider if:  You have blood in your stool 2-3 days after the procedure. Get help right away if you have:  More than a small spotting of blood in your stool.  Large blood clots in your  stool.  Swelling of your abdomen.  Nausea or vomiting.  A fever.  Increasing pain in your abdomen that is not relieved with medicine. Summary  After the procedure, it is common to have a small amount of blood in your stool. You may also have mild cramping and bloating of your abdomen.  If you were given a sedative during the procedure, it can affect you for several hours. Do not drive or operate machinery until your health care provider says that it is safe.  Get help right away if you have a lot of blood in your stool, nausea or vomiting, a fever, or increased pain in your abdomen. This information is not intended to replace advice given to you by your health care provider. Make sure you discuss any questions you have with your health care provider. Document Revised: 08/25/2019 Document Reviewed: 03/27/2019 Elsevier Patient Education  2021 [place] After This sheet gives you information about how to care for yourself after your procedure. Your health care provider may also give you more specific instructions. If you have problems or questions, contact your health care provider. What can I expect after the procedure? After the procedure, it is common to have:  Tiredness.  Forgetfulness about what happened after the procedure.  Impaired judgment for important decisions.  Nausea or vomiting.  Some difficulty with balance. Follow these instructions at home: For the time period you were told by your health care provider:  Rest as needed.  Do not participate in activities where you could fall or become injured.  Do not drive or use machinery.  Do not drink alcohol.  Do not take sleeping pills or medicines that cause drowsiness.  Do not make important decisions or sign legal documents.  Do not take care of children on your own.      Eating and drinking  Follow the diet that is recommended by your health care provider.  Drink  enough fluid to keep your urine pale yellow.  If you vomit: ? Drink water, juice, or soup when you can drink without vomiting. ? Make sure you have little or no nausea before eating solid foods. General instructions  Have a responsible adult stay with you for the time you are told. It is important to have someone help care for you until you are awake and alert.  Take over-the-counter and prescription medicines only as told by your health care provider.  If you have sleep apnea, surgery and certain medicines can increase your risk for breathing problems. Follow instructions from your health care provider about wearing your sleep device: ? Anytime you are sleeping, including during daytime naps. ? While taking prescription pain medicines, sleeping medicines, or medicines  that make you drowsy.  Avoid smoking.  Keep all follow-up visits as told by your health care provider. This is important. Contact a health care provider if:  You keep feeling nauseous or you keep vomiting.  You feel light-headed.  You are still sleepy or having trouble with balance after 24 hours.  You develop a rash.  You have a fever.  You have redness or swelling around the IV site. Get help right away if:  You have trouble breathing.  You have new-onset confusion at home. Summary  For several hours after your procedure, you may feel tired. You may also be forgetful and have poor judgment.  Have a responsible adult stay with you for the time you are told. It is important to have someone help care for you until you are awake and alert.  Rest as told. Do not drive or operate machinery. Do not drink alcohol or take sleeping pills.  Get help right away if you have trouble breathing, or if you suddenly become confused. This information is not intended to replace advice given to you by your health care provider. Make sure you discuss any questions you have with your health care provider. Document Revised:  05/16/2020 Document Reviewed: 08/03/2019 Elsevier Patient Education  2021 [employer].

## 2021-02-12 ENCOUNTER — Other Ambulatory Visit (HOSPITAL_COMMUNITY)
Admission: RE | Admit: 2021-02-12 | Discharge: 2021-02-12 | Disposition: A | Discharge status: Home / Self Care | Payer: Medicare Other | Source: Ambulatory Visit | Attending: Gastroenterology | Admitting: Gastroenterology

## 2021-02-12 ENCOUNTER — Other Ambulatory Visit: Payer: Self-pay

## 2021-02-12 ENCOUNTER — Encounter (HOSPITAL_COMMUNITY): Payer: Self-pay

## 2021-02-12 ENCOUNTER — Encounter (HOSPITAL_COMMUNITY)
Admission: RE | Admit: 2021-02-12 | Discharge: 2021-02-12 | Disposition: A | Discharge status: Home / Self Care | Payer: Medicare Other | Source: Ambulatory Visit | Attending: Gastroenterology | Admitting: Gastroenterology

## 2021-02-14 ENCOUNTER — Ambulatory Visit (HOSPITAL_COMMUNITY): Admission: RE | Admit: 2021-02-14 | Payer: Medicare Other | Source: Home / Self Care | Admitting: Gastroenterology

## 2021-02-14 ENCOUNTER — Encounter (HOSPITAL_COMMUNITY): Admission: RE | Payer: Self-pay | Source: Home / Self Care

## 2021-02-14 SURGERY — COLONOSCOPY WITH PROPOFOL
Anesthesia: Monitor Anesthesia Care

## 2021-02-27 ENCOUNTER — Ambulatory Visit (INDEPENDENT_AMBULATORY_CARE_PROVIDER_SITE_OTHER): Payer: Medicare Other | Admitting: Gastroenterology

## 2021-03-12 DIAGNOSIS — M25511 Pain in right shoulder: Secondary | ICD-10-CM | POA: Diagnosis not present

## 2021-03-12 DIAGNOSIS — M25611 Stiffness of right shoulder, not elsewhere classified: Secondary | ICD-10-CM | POA: Diagnosis not present

## 2021-03-12 DIAGNOSIS — R293 Abnormal posture: Secondary | ICD-10-CM | POA: Diagnosis not present

## 2021-03-12 DIAGNOSIS — R531 Weakness: Secondary | ICD-10-CM | POA: Diagnosis not present

## 2021-03-14 DIAGNOSIS — M25511 Pain in right shoulder: Secondary | ICD-10-CM | POA: Diagnosis not present

## 2021-03-14 DIAGNOSIS — R531 Weakness: Secondary | ICD-10-CM | POA: Diagnosis not present

## 2021-03-14 DIAGNOSIS — M25611 Stiffness of right shoulder, not elsewhere classified: Secondary | ICD-10-CM | POA: Diagnosis not present

## 2021-03-14 DIAGNOSIS — R293 Abnormal posture: Secondary | ICD-10-CM | POA: Diagnosis not present

## 2021-03-18 DIAGNOSIS — R293 Abnormal posture: Secondary | ICD-10-CM | POA: Diagnosis not present

## 2021-03-18 DIAGNOSIS — M25511 Pain in right shoulder: Secondary | ICD-10-CM | POA: Diagnosis not present

## 2021-03-18 DIAGNOSIS — R531 Weakness: Secondary | ICD-10-CM | POA: Diagnosis not present

## 2021-03-18 DIAGNOSIS — M25611 Stiffness of right shoulder, not elsewhere classified: Secondary | ICD-10-CM | POA: Diagnosis not present

## 2021-03-19 ENCOUNTER — Ambulatory Visit (HOSPITAL_COMMUNITY)
Admission: RE | Admit: 2021-03-19 | Discharge: 2021-03-19 | Disposition: A | Discharge status: Home / Self Care | Payer: Medicare Other | Source: Ambulatory Visit | Attending: Student | Admitting: Student

## 2021-03-19 ENCOUNTER — Other Ambulatory Visit (HOSPITAL_COMMUNITY): Payer: Self-pay | Admitting: Student

## 2021-03-19 DIAGNOSIS — M25511 Pain in right shoulder: Secondary | ICD-10-CM

## 2021-05-06 ENCOUNTER — Other Ambulatory Visit: Payer: Self-pay

## 2021-05-06 ENCOUNTER — Inpatient Hospital Stay (HOSPITAL_COMMUNITY)
Admission: EM | Admit: 2021-05-06 | Discharge: 2021-05-08 | DRG: 378 | Disposition: A | Discharge status: Home / Self Care | Payer: Medicare Other | Attending: Internal Medicine | Admitting: Internal Medicine

## 2021-05-06 ENCOUNTER — Encounter (HOSPITAL_COMMUNITY): Payer: Self-pay | Admitting: Emergency Medicine

## 2021-05-06 DIAGNOSIS — Z9049 Acquired absence of other specified parts of digestive tract: Secondary | ICD-10-CM

## 2021-05-06 DIAGNOSIS — Z7982 Long term (current) use of aspirin: Secondary | ICD-10-CM

## 2021-05-06 DIAGNOSIS — Z20822 Contact with and (suspected) exposure to covid-19: Secondary | ICD-10-CM | POA: Diagnosis not present

## 2021-05-06 DIAGNOSIS — Z8616 Personal history of COVID-19: Secondary | ICD-10-CM | POA: Diagnosis not present

## 2021-05-06 DIAGNOSIS — K921 Melena: Secondary | ICD-10-CM

## 2021-05-06 DIAGNOSIS — R1310 Dysphagia, unspecified: Secondary | ICD-10-CM

## 2021-05-06 DIAGNOSIS — K922 Gastrointestinal hemorrhage, unspecified: Secondary | ICD-10-CM | POA: Diagnosis not present

## 2021-05-06 DIAGNOSIS — Z634 Disappearance and death of family member: Secondary | ICD-10-CM

## 2021-05-06 DIAGNOSIS — Z79899 Other long term (current) drug therapy: Secondary | ICD-10-CM

## 2021-05-06 DIAGNOSIS — I252 Old myocardial infarction: Secondary | ICD-10-CM

## 2021-05-06 DIAGNOSIS — K5900 Constipation, unspecified: Secondary | ICD-10-CM | POA: Diagnosis present

## 2021-05-06 DIAGNOSIS — J449 Chronic obstructive pulmonary disease, unspecified: Secondary | ICD-10-CM | POA: Diagnosis not present

## 2021-05-06 DIAGNOSIS — K648 Other hemorrhoids: Secondary | ICD-10-CM | POA: Diagnosis present

## 2021-05-06 DIAGNOSIS — Z87891 Personal history of nicotine dependence: Secondary | ICD-10-CM

## 2021-05-06 DIAGNOSIS — Z800 Family history of malignant neoplasm of digestive organs: Secondary | ICD-10-CM

## 2021-05-06 DIAGNOSIS — K5731 Diverticulosis of large intestine without perforation or abscess with bleeding: Secondary | ICD-10-CM | POA: Diagnosis not present

## 2021-05-06 DIAGNOSIS — Z720 Tobacco use: Secondary | ICD-10-CM | POA: Diagnosis not present

## 2021-05-06 DIAGNOSIS — Z8249 Family history of ischemic heart disease and other diseases of the circulatory system: Secondary | ICD-10-CM

## 2021-05-06 DIAGNOSIS — K449 Diaphragmatic hernia without obstruction or gangrene: Secondary | ICD-10-CM | POA: Diagnosis present

## 2021-05-06 DIAGNOSIS — K222 Esophageal obstruction: Secondary | ICD-10-CM | POA: Diagnosis present

## 2021-05-06 DIAGNOSIS — K625 Hemorrhage of anus and rectum: Secondary | ICD-10-CM | POA: Diagnosis not present

## 2021-05-06 DIAGNOSIS — K2100 Gastro-esophageal reflux disease with esophagitis, without bleeding: Secondary | ICD-10-CM | POA: Diagnosis present

## 2021-05-06 DIAGNOSIS — E785 Hyperlipidemia, unspecified: Secondary | ICD-10-CM | POA: Diagnosis present

## 2021-05-06 DIAGNOSIS — Z955 Presence of coronary angioplasty implant and graft: Secondary | ICD-10-CM

## 2021-05-06 DIAGNOSIS — I2510 Atherosclerotic heart disease of native coronary artery without angina pectoris: Secondary | ICD-10-CM

## 2021-05-06 DIAGNOSIS — H9190 Unspecified hearing loss, unspecified ear: Secondary | ICD-10-CM | POA: Diagnosis present

## 2021-05-06 DIAGNOSIS — I10 Essential (primary) hypertension: Secondary | ICD-10-CM | POA: Diagnosis not present

## 2021-05-06 DIAGNOSIS — Z85828 Personal history of other malignant neoplasm of skin: Secondary | ICD-10-CM

## 2021-05-06 DIAGNOSIS — K2970 Gastritis, unspecified, without bleeding: Secondary | ICD-10-CM | POA: Diagnosis present

## 2021-05-06 DIAGNOSIS — D62 Acute posthemorrhagic anemia: Secondary | ICD-10-CM | POA: Diagnosis present

## 2021-05-06 DIAGNOSIS — R1314 Dysphagia, pharyngoesophageal phase: Secondary | ICD-10-CM | POA: Diagnosis present

## 2021-05-06 LAB — URINALYSIS, COMPLETE (UACMP) WITH MICROSCOPIC
Bacteria, UA: NONE SEEN
Bilirubin Urine: NEGATIVE
Glucose, UA: NEGATIVE mg/dL
Hgb urine dipstick: NEGATIVE
Ketones, ur: NEGATIVE mg/dL
Leukocytes,Ua: NEGATIVE
Nitrite: NEGATIVE
Protein, ur: NEGATIVE mg/dL
Specific Gravity, Urine: 1.025 (ref 1.005–1.030)
pH: 5 (ref 5.0–8.0)

## 2021-05-06 LAB — COMPREHENSIVE METABOLIC PANEL
ALT: 10 U/L (ref 0–44)
AST: 14 U/L — ABNORMAL LOW (ref 15–41)
Albumin: 3.7 g/dL (ref 3.5–5.0)
Alkaline Phosphatase: 78 U/L (ref 38–126)
Anion gap: 4 — ABNORMAL LOW (ref 5–15)
BUN: 21 mg/dL (ref 8–23)
CO2: 29 mmol/L (ref 22–32)
Calcium: 8.3 mg/dL — ABNORMAL LOW (ref 8.9–10.3)
Chloride: 107 mmol/L (ref 98–111)
Creatinine, Ser: 1.12 mg/dL (ref 0.61–1.24)
GFR, Estimated: >60 mL/min (ref >60)
Glucose, Bld: 110 mg/dL — ABNORMAL HIGH (ref 70–99)
Potassium: 3.8 mmol/L (ref 3.5–5.1)
Sodium: 140 mmol/L (ref 135–145)
Total Bilirubin: 0.9 mg/dL (ref 0.3–1.2)
Total Protein: 6.2 g/dL — ABNORMAL LOW (ref 6.5–8.1)

## 2021-05-06 LAB — CBC
HCT: 43.2 % (ref 39.0–52.0)
HCT: 38.9 % — ABNORMAL LOW (ref 39.0–52.0)
Hemoglobin: 14.5 g/dL (ref 13.0–17.0)
Hemoglobin: 12.8 g/dL — ABNORMAL LOW (ref 13.0–17.0)
MCH: 30.6 pg (ref 26.0–34.0)
MCH: 30.5 pg (ref 26.0–34.0)
MCHC: 33.6 g/dL (ref 30.0–36.0)
MCHC: 32.9 g/dL (ref 30.0–36.0)
MCV: 91.1 fL (ref 80.0–100.0)
MCV: 92.8 fL (ref 80.0–100.0)
Platelets: 196 10*3/uL (ref 150–400)
Platelets: 186 10*3/uL (ref 150–400)
RBC: 4.74 MIL/uL (ref 4.22–5.81)
RBC: 4.19 MIL/uL — ABNORMAL LOW (ref 4.22–5.81)
RDW: 13.4 % (ref 11.5–15.5)
RDW: 13.5 % (ref 11.5–15.5)
WBC: 5.4 10*3/uL (ref 4.0–10.5)
WBC: 5.7 10*3/uL (ref 4.0–10.5)
nRBC: 0 % (ref 0.0–0.2)
nRBC: 0 % (ref 0.0–0.2)

## 2021-05-06 LAB — HEMOGLOBIN AND HEMATOCRIT, BLOOD
HCT: 38 % — ABNORMAL LOW (ref 39.0–52.0)
HCT: 41 % (ref 39.0–52.0)
Hemoglobin: 12.8 g/dL — ABNORMAL LOW (ref 13.0–17.0)
Hemoglobin: 13.7 g/dL (ref 13.0–17.0)

## 2021-05-06 LAB — SURGICAL PCR SCREEN
MRSA, PCR: NEGATIVE
Staphylococcus aureus: NEGATIVE

## 2021-05-06 LAB — TYPE AND SCREEN
ABO/RH(D): O NEG
Antibody Screen: NEGATIVE

## 2021-05-06 LAB — POC OCCULT BLOOD, ED: Fecal Occult Bld: POSITIVE — AB

## 2021-05-06 LAB — RESP PANEL BY RT-PCR (FLU A&B, COVID) ARPGX2
Influenza A by PCR: NEGATIVE
Influenza B by PCR: NEGATIVE
SARS Coronavirus 2 by RT PCR: NEGATIVE

## 2021-05-06 MED ORDER — PANTOPRAZOLE SODIUM 40 MG IV SOLR
40.0000 mg | Freq: Two times a day (BID) | INTRAVENOUS | Status: DC
  Administered 2021-05-06 – 2021-05-08 (×5): 40 mg via INTRAVENOUS
  Filled 2021-05-06 (×5): qty 40

## 2021-05-06 MED ORDER — MUPIROCIN 2 % EX OINT
1.0000 | TOPICAL_OINTMENT | Freq: Two times a day (BID) | CUTANEOUS | Status: DC
Start: 2021-05-06 — End: 2021-05-08
  Administered 2021-05-06 – 2021-05-08 (×4): 1 via NASAL
  Filled 2021-05-06 (×2): qty 22

## 2021-05-06 MED ORDER — LISINOPRIL 5 MG PO TABS
5.0000 mg | ORAL_TABLET | Freq: Every day | ORAL | Status: DC
  Administered 2021-05-06 – 2021-05-08 (×3): 5 mg via ORAL
  Filled 2021-05-06 (×4): qty 1

## 2021-05-06 MED ORDER — ONDANSETRON HCL 4 MG PO TABS: 4.0000 mg | ORAL_TABLET | Freq: Four times a day (QID) | ORAL | Status: DC | PRN

## 2021-05-06 MED ORDER — SIMVASTATIN 20 MG PO TABS
40.0000 mg | ORAL_TABLET | Freq: Every evening | ORAL | Status: DC
  Administered 2021-05-06 – 2021-05-07 (×2): 40 mg via ORAL
  Filled 2021-05-06 (×2): qty 2

## 2021-05-06 MED ORDER — MORPHINE SULFATE (PF) 2 MG/ML IV SOLN
2.0000 mg | INTRAVENOUS | Status: DC | PRN
Start: 2021-05-06 — End: 2021-05-08

## 2021-05-06 MED ORDER — ACETAMINOPHEN 325 MG PO TABS
650.0000 mg | ORAL_TABLET | Freq: Four times a day (QID) | ORAL | Status: DC | PRN
  Administered 2021-05-07: 650 mg via ORAL
  Filled 2021-05-06: qty 2

## 2021-05-06 MED ORDER — ACETAMINOPHEN 650 MG RE SUPP: 650.0000 mg | Freq: Four times a day (QID) | RECTAL | Status: DC | PRN

## 2021-05-06 MED ORDER — OXYCODONE HCL 5 MG PO TABS: 5.0000 mg | ORAL_TABLET | ORAL | Status: DC | PRN

## 2021-05-06 MED ORDER — ALBUTEROL SULFATE HFA 108 (90 BASE) MCG/ACT IN AERS: 1.0000 | INHALATION_SPRAY | Freq: Four times a day (QID) | RESPIRATORY_TRACT | Status: DC | PRN

## 2021-05-06 MED ORDER — ONDANSETRON HCL 4 MG/2ML IJ SOLN: 4.0000 mg | Freq: Four times a day (QID) | INTRAMUSCULAR | Status: DC | PRN

## 2021-05-06 MED ORDER — PEG 3350-KCL-NA BICARB-NACL 420 G PO SOLR
4000.0000 mL | Freq: Once | ORAL | Status: AC
  Administered 2021-05-06: 4000 mL via ORAL

## 2021-05-06 NOTE — ED Triage Notes (Signed)
Pt c/o rectal bleeding that started this am. Pt states he was nauseated yesterday, but denies any pain.

## 2021-05-06 NOTE — H&P (Signed)
TRH H&P    Patient Demographics:    Jeremy [name], is a 85 y.o. male  MRN: [id number]  DOB - 04-Jun-1936  Admit Date - 05/06/2021  Referring MD/NP/PA: Stark Jock  Outpatient Primary MD for the patient is Celene Squibb, MD  Patient coming from: Home  Chief complaint-rectal bleeding   HPI:    Jeremy [name]  is a 85 y.o. male, with history of coronary artery disease, COPD, diverticular of the colon, hyperlipidemia, hypertension, myocardial infarction, tobacco abuse, presents to ED with a chief complaint of rectal bleeding.  Patient reports that it started 1 to 2 days ago.  He reports that he has had gushes of blood without his bowel movements.  The bowel movements have been getting more frequent.  Last night he had 3-4 bloody bowel movements since 2 AM.  He reports that the blood appears as bright red.  He has no abdominal pain.  He did feel nauseous last evening, but he was able to have his dinner and his nightly milkshake without a problem.  He has not had any vomiting.  His last normal meal was dinner yesterday.  Lastly patient complains of some orthostatic symptoms.  Patient does report that he has similar incident a month ago.  He had a very constipated bowel movement which was nonbloody.  Then the next bowel movement he had was bloody.  Then it was completely resolved.  Patient reports that he does have fatigue in his legs, shortness of breath, but those seem to be related to post COVID symptoms since 2021.  Patient has no other complaints at this time.  Patient does not smoke and he does chew tobacco.  He does not drink alcohol.  He did declines nicotine patch at this time.  Of note patient does report that 4 months ago he was supposed to have a colonoscopy with Dr. Melony Overly, but his son died and he just was not able to make it to that appointment.  He has not seen gastro since.  In the ED Temperature 98.7, heart rate 71-88,  respiratory rate 18, blood pressure 155/91, satting at 94% White blood cell count 5.4, hemoglobin 14.5 Chemistry panel reveals a creatinine of 1.12, glucose 110, otherwise unremarkable FOBT is positive EKG shows a heart rate of 81, sinus rhythm, QTC 472 Patient was typed and screened in the ED Admission requested for further work-up of GI bleed-especially since patient is symptomatic with orthostatic symptoms     Review of systems:    In addition to the HPI above,  No Fever-chills, No Headache, No changes with Vision or hearing, No problems swallowing food or Liquids, No Chest pain, Cough or Shortness of Breath, No Abdominal pain, admits to nausea but no vomiting, bowel movements are regular, Admits to rectal bleeding No dysuria, No new skin rashes or bruises, No new joints pains-aches,  No new weakness, tingling, numbness in any extremity, No recent weight gain or loss, No polyuria, polydypsia or polyphagia, No significant Mental Stressors.  All other systems reviewed and are negative.    Past  History of the following :    Past Medical History:  Diagnosis Date   Abnormal CXR 4/20011   Chest  CT mode/serere COPD ( no mediastinal abnormality)   CAD (coronary artery disease) 2005   anterior MI with stent to the LAD in 1991 / stent 1996 / nuclear 2005, no ischemia   Carotid bruit    Doppler, June, 2013, 0-39% bilateral   COPD (chronic obstructive pulmonary disease) ([place])    Diverticula of colon    Ejection fraction    55-60%, echo, April, 2011, could not estimate RV pressure   Esophagitis    HOH (hard of hearing)    Hyperlipidemia    Hypertension    Myocardial infarction ([place])    x2   Shortness of breath    with exertion   Sleep apnea    Stop Bang score of 5   Tobacco abuse       Past Surgical History:  Procedure Laterality Date   APPENDECTOMY     BIOPSY  11/17/2017   Procedure: BIOPSY;  Surgeon: Rogene Houston, MD;  [facility];  Service:  Endoscopy;;  gastric   CARDIAC CATHETERIZATION     CATARACT EXTRACTION Left    CATARACT EXTRACTION W/PHACO Right 01/22/2014   Procedure: CATARACT EXTRACTION PHACO AND INTRAOCULAR LENS PLACEMENT ([place]);  Surgeon: Tonny Branch, MD;  Location: [facility];  Service: Ophthalmology;  Laterality: Right;  CDE 7.46   CHOLECYSTECTOMY     8 years ago [facility]   COLONOSCOPY  10/05/2012   Dr. Laural Golden: multiple diverticula at sigmoid colon, multiple polyps (tubular adenomas), small external hemorrhoids   COLONOSCOPY N/A 11/18/2017   Procedure: COLONOSCOPY;  Surgeon: Rogene Houston, MD;  [facility];  Service: Endoscopy;  Laterality: N/A;   CYST REMOVAL HAND     right wrist   ESOPHAGEAL DILATION N/A 05/23/2015   Procedure: ESOPHAGEAL DILATION;  Surgeon: Daneil Dolin, MD;  [facility];  Service: Endoscopy;  Laterality: N/A;   ESOPHAGEAL DILATION N/A 11/17/2017   Procedure: ESOPHAGEAL DILATION;  Surgeon: Rogene Houston, MD;  [facility];  Service: Endoscopy;  Laterality: N/A;   ESOPHAGOGASTRODUODENOSCOPY N/A 05/23/2015   Dr. Gala Romney: Ulcerative/ erosive reflux esophagitis with peptic stricture formation. Status post dilation as described. Hiatal hernia. Abnormal gastric mucosa of uncertain significance status post gastric biospy. +H.pylori   ESOPHAGOGASTRODUODENOSCOPY N/A 11/17/2017   Procedure: ESOPHAGOGASTRODUODENOSCOPY (EGD);  Surgeon: Rogene Houston, MD;  [facility];  Service: Endoscopy;  Laterality: N/A;   PERCUTANEOUS STENT INTERVENTION     SKIN CANCER EXCISION  2015   ear & neck   TONSILLECTOMY        Social History:      Social History   Tobacco Use   Smoking status: Former    Packs/day: 3.00    Years: 12.00    Pack years: 36.00    Types: Cigarettes    Start date: 09/15/1983    Quit date: 09/14/1994    Years since quitting: 26.6   Smokeless tobacco: Current    Types: Chew   Tobacco comments:    chews 80% of a pack tobacco per day -- 10/12 doesnt chew too  much  Substance Use Topics   Alcohol use: No    Alcohol/week: 0.0 standard drinks       Family History :     Family History  Problem Relation Age of Onset   Heart disease Mother    Heart disease Father  Heart disease Brother    Stomach cancer Brother    Colon cancer Neg Hx       Home Medications:   Prior to Admission medications   Medication Sig Start Date End Date Taking? Authorizing Provider  acetaminophen (TYLENOL) 500 MG tablet Take 500-1,000 mg by mouth daily as needed for moderate pain or headache.    [provider]  albuterol (PROVENTIL HFA;VENTOLIN HFA) 108 (90 Base) MCG/ACT inhaler Inhale 1-2 puffs into the lungs every 6 (six) hours as needed for wheezing or shortness of breath.    [provider]  aspirin 81 MG tablet Take 1 tablet (81 mg total) by mouth every evening. 11/20/17   Barton Dubois, MD  Cyanocobalamin (B-12 COMPLIANCE INJECTION IJ) Inject 1 Dose as directed every 30 (thirty) days.    [provider]  diphenhydrAMINE (BENADRYL) 25 MG tablet Take 12.5 mg by mouth at bedtime as needed for sleep.    [provider]  lisinopril (ZESTRIL) 5 MG tablet TAKE 1 TABLET BY MOUTH DAILY Patient taking differently: Take 5 mg by mouth daily. 12/09/20   Arnoldo Lenis, MD  loratadine (CLARITIN) 10 MG tablet Take 10 mg by mouth daily as needed for allergies.    [provider]  Menthol, Topical Analgesic, (ICY HOT EX) Apply 1 application topically daily as needed (pain).    [provider]  nitroGLYCERIN (NITROSTAT) 0.4 MG SL tablet PLACE 1 TABLET UNDER THE TONGUE EVERY 5 MINUTES FOR 3 DOSES AS NEEDED FOR CHEST PAIN Patient taking differently: Place 0.4 mg under the tongue every 5 (five) minutes as needed for chest pain. 08/11/16   Arnoldo Lenis, MD  OVER THE COUNTER MEDICATION Apply 1 application topically daily as needed (pain). [place] gel FOR BACK PAIN    [provider]  pantoprazole (PROTONIX) 40 MG  tablet TAKE 1 TABLET(40 MG) BY MOUTH DAILY Patient taking differently: Take 40 mg by mouth daily. 01/03/21   Arnoldo Lenis, MD  simvastatin (ZOCOR) 40 MG tablet TAKE 1 TABLET(40 MG) BY MOUTH EVERY EVENING Patient taking differently: Take 40 mg by mouth every evening. 01/22/21   Branch, Alphonse Guild, MD  vitamin B-12 (CYANOCOBALAMIN) 500 MCG tablet Take 500 mcg by mouth daily.    [provider]     Allergies:    No Known Allergies   Physical Exam:   Vitals  Blood pressure (!) 155/91, pulse 71, temperature 98.7 F (37.1 C), temperature source Oral, resp. rate 18, height 6' (1.829 m), weight 81 kg, SpO2 94 %.  1.  General: Patient sitting up on edge of bed,  no acute distress, very hard of hearing   2. Psychiatric: Alert and oriented x 3, mood and behavior normal for situation, pleasant and cooperative with exam   3. Neurologic: Speech and language are normal, face is symmetric, moves all 4 extremities voluntarily, at baseline without acute deficits on limited exam   4. HEENMT:  Head is atraumatic, normocephalic, pupils reactive to light, neck is supple, trachea is midline, mucous membranes are moist   5. Respiratory : Lungs are clear to auscultation bilaterally without wheezing, rhonchi, rales, no cyanosis, no increase in work of breathing or accessory muscle use   6. Cardiovascular : Heart rate normal, rhythm is regular, no murmurs, rubs or gallops, no peripheral edema, peripheral pulses palpated   7. Gastrointestinal:  Abdomen is soft, nondistended, nontender to palpation bowel sounds active, no masses or organomegaly palpated   8. Skin:  Skin is warm, dry  and intact without rashes, acute lesions, or ulcers on limited exam   9.Musculoskeletal:  No acute deformities or trauma, no asymmetry in tone, no peripheral edema, peripheral pulses palpated, no tenderness to palpation in the extremities     Data Review:    CBC Recent Labs  Lab 05/06/21 0400  WBC 5.4   HGB 14.5  HCT 43.2  PLT 196  MCV 91.1  MCH 30.6  MCHC 33.6  RDW 13.4   ------------------------------------------------------------------------------------------------------------------  Results for orders placed or performed during the hospital encounter of 05/06/21 (from the past 48 hour(s))  Comprehensive metabolic panel     Status: Abnormal   Collection Time: 05/06/21  4:00 AM  Result Value Ref Range   Sodium 140 135 - 145 mmol/L   Potassium 3.8 3.5 - 5.1 mmol/L   Chloride 107 98 - 111 mmol/L   CO2 29 22 - 32 mmol/L   Glucose, Bld 110 (H) 70 - 99 mg/dL    Comment: Glucose reference range applies only to samples taken after fasting for at least 8 hours.   BUN 21 8 - 23 mg/dL   Creatinine, Ser 1.12 0.61 - 1.24 mg/dL   Calcium 8.3 (L) 8.9 - 10.3 mg/dL   Total Protein 6.2 (L) 6.5 - 8.1 g/dL   Albumin 3.7 3.5 - 5.0 g/dL   AST 14 (L) 15 - 41 U/L   ALT 10 0 - 44 U/L   Alkaline Phosphatase 78 38 - 126 U/L   Total Bilirubin 0.9 0.3 - 1.2 mg/dL   GFR, Estimated >60 >60 mL/min    Comment: (NOTE) Calculated using the CKD-EPI Creatinine Equation (2021)    Anion gap 4 (L) 5 - 15    Comment: Performed at [facility], [address] [place], [place] [address]  CBC     Status: None   Collection Time: 05/06/21  4:00 AM  Result Value Ref Range   WBC 5.4 4.0 - 10.5 K/uL   RBC 4.74 4.22 - 5.81 MIL/uL   Hemoglobin 14.5 13.0 - 17.0 g/dL   HCT 43.2 39.0 - 52.0 %   MCV 91.1 80.0 - 100.0 fL   MCH 30.6 26.0 - 34.0 pg   MCHC 33.6 30.0 - 36.0 g/dL   RDW 13.4 11.5 - 15.5 %   Platelets 196 150 - 400 K/uL   nRBC 0.0 0.0 - 0.2 %    Comment: Performed at [facility], [address], [place], [place] [address]  Type and screen [facility]     Status: None   Collection Time: 05/06/21  4:00 AM  Result Value Ref Range   ABO/RH(D) O NEG    Antibody Screen NEG    Sample Expiration      05/09/2021,2359 Performed at [facility], [address], [place], [place] [address]   POC occult  blood, ED     Status: Abnormal   Collection Time: 05/06/21  4:35 AM  Result Value Ref Range   Fecal Occult Bld POSITIVE (A) NEGATIVE    Chemistries  Recent Labs  Lab 05/06/21 0400  NA 140  K 3.8  CL 107  CO2 29  GLUCOSE 110*  BUN 21  CREATININE 1.12  CALCIUM 8.3*  AST 14*  ALT 10  ALKPHOS 78  BILITOT 0.9   ------------------------------------------------------------------------------------------------------------------  ------------------------------------------------------------------------------------------------------------------ GFR: Estimated Creatinine Clearance: 54.9 mL/min (by C-G formula based on SCr of 1.12 mg/dL). Liver Function Tests: Recent Labs  Lab 05/06/21 0400  AST 14*  ALT 10  ALKPHOS 78  BILITOT 0.9  PROT 6.2*  ALBUMIN 3.7   No results for input(s): LIPASE, AMYLASE in the last 168 hours. No results for input(s): AMMONIA in the last 168 hours. Coagulation Profile: No results for input(s): INR, PROTIME in the last 168 hours. Cardiac Enzymes: No results for input(s): CKTOTAL, CKMB, CKMBINDEX, TROPONINI in the last 168 hours. BNP (last 3 results) No results for input(s): PROBNP in the last 8760 hours. HbA1C: No results for input(s): HGBA1C in the last 72 hours. CBG: No results for input(s): GLUCAP in the last 168 hours. Lipid Profile: No results for input(s): CHOL, HDL, LDLCALC, TRIG, CHOLHDL, LDLDIRECT in the last 72 hours. Thyroid Function Tests: No results for input(s): TSH, T4TOTAL, FREET4, T3FREE, THYROIDAB in the last 72 hours. Anemia Panel: No results for input(s): VITAMINB12, FOLATE, FERRITIN, TIBC, IRON, RETICCTPCT in the last 72 hours.  --------------------------------------------------------------------------------------------------------------- Urine analysis:    Component Value Date/Time   COLORURINE AMBER (A) 04/17/2019 1311   APPEARANCEUR HAZY (A) 04/17/2019 1311   LABSPEC 1.027 04/17/2019 1311   PHURINE 5.0 04/17/2019  1311   GLUCOSEU NEGATIVE 04/17/2019 1311   HGBUR NEGATIVE 04/17/2019 1311   BILIRUBINUR NEGATIVE 04/17/2019 1311   KETONESUR NEGATIVE 04/17/2019 1311   PROTEINUR NEGATIVE 04/17/2019 1311   UROBILINOGEN 0.2 10/22/2013 0820   NITRITE NEGATIVE 04/17/2019 1311   LEUKOCYTESUR NEGATIVE 04/17/2019 1311      Imaging Results:    No results found.  My personal review of EKG: Rhythm NSR, Rate 81/min, QTc 472 ,no Acute ST changes   Assessment & Plan:    Active Problems:   Hypertension   CAD (coronary artery disease)   Tobacco abuse   GI bleed   GI bleed With reported bright red blood per rectum Hold aspirin N.p.o. GI consult Trend CBC at 10 AM Continue to monitor Tobacco abuse Counseled on the importance of cessation Patient chews tobacco -does not smoke Declines nicotine patch at this time Hypertension Continue lisinopril Hyperlipidemia Continue statin     DVT Prophylaxis-    SCDs   AM Labs Ordered, also please review Full Orders  Family Communication: Admission, patients condition and plan of care including tests being ordered have been discussed with the patient and wife who indicate understanding and agree with the plan and Code Status.  Code Status: Full  Admission status: Observation Time spent in minutes : [place]

## 2021-05-06 NOTE — Progress Notes (Signed)
PROGRESS NOTE  Brief Narrative: Jeremy [name] is an 85 y.o. male with a history of CAD s/p MI, COPD, tobacco use, HTN, HLD, and diverticulosis who presented to the ED with BRBPR for 2 days following a constipated hard stool. This has become associated with nausea and orthostatic lightheadedness prompting ED evaluation early this morning. He has continued to have bright red stools in the ED, though hgb is 14g/dl. He is symptomatic from blood loss, so was admitted this morning with GI consult pending.  Subjective: Last red bloody bowel movement "filled the bowl" per wife was around 2-3 am. No current chest or abdominal pain, nausea or vomiting.   Objective: BP 128/81   Pulse 73   Temp 98.7 F (37.1 C) (Oral)   Resp 17   Ht 6' (1.829 m)   Wt 81 kg   SpO2 98%   BMI 24.22 kg/m   Gen: Elderly male in no distress Pulm: Clear and nonlabored on room air, distant without wheezes or crackles.  CV: RRR, no murmur, no JVD, no edema GI: Soft, NT, ND, +BS  Neuro: Alert and oriented. HOH but no focal deficits. Skin: No rashes, lesions or ulcers on visualized skin.  Assessment & Plan: Painless hematochezia, +FOBT, known sigmoid diverticulosis: Still having gross bloody output. Hemodynamically stable currently, hgb wnl, and BUN is only 21 all reassuring, but symptomatic from blood loss. Also had EGD March 2019 w/grade B esophagitis, gastritis, hiatal hernia and esophageal stenosis that was dilated.  - GI consulted. Was due to repeat colonoscopy previously (last 2019 with poor prep. In 2014 with Dr. Laural Golden w/tubular adenomas, sigmoid diverticulosis, small external hemorrhoids). Pt is NPO.  - Continue PPI with known esophagitis/gastritis - Trend hgb this PM and in AM. Has been type and screened.  CAD s/p stents, initially LAD 1991: No anginal symptoms currently. - Hold ASA. Not on beta blocker at home ?due to COPD. Continue statin  HTN, HLD:  - With elevated BP, will continue low dose lisinopril.  Continue statin  COPD: Without exacerbation.  - Continue routine BDs  Tobacco use:  - Declined nicotine patch. Cessation counseling was provided.   Patrecia Pour, MD Pager on [facility] 05/06/2021, 8:57 AM

## 2021-05-06 NOTE — ED Provider Notes (Signed)
[facility] EMERGENCY DEPARTMENT Provider Note   CSN: [id number] Arrival date & time: 05/06/21  0345     History Chief Complaint  Patient presents with   Rectal Bleeding    Jeremy [name] is a 85 y.o. male.  Patient is an 85 year old male with history of coronary artery disease with stent, COPD, hyperlipidemia, hypertension.  Patient presenting today for evaluation of rectal bleeding.  He reports 3 episodes of bloody bowel movements since yesterday evening.  He describes bright red and maroon-colored blood each time he has stooled.  He denies any abdominal pain or rectal pain.  He denies any fevers or chills.  He takes an 81 mg aspirin, but no other blood thinners.  The history is provided by the patient.  Rectal Bleeding Quality:  Bright red and maroon Amount:  Moderate Duration:  6 hours Timing:  Intermittent Chronicity:  New Relieved by:  Nothing Worsened by:  Nothing Ineffective treatments:  None tried     Past Medical History:  Diagnosis Date   Abnormal CXR 4/20011   Chest  CT mode/serere COPD ( no mediastinal abnormality)   CAD (coronary artery disease) 2005   anterior MI with stent to the LAD in 1991 / stent 1996 / nuclear 2005, no ischemia   Carotid bruit    Doppler, June, 2013, 0-39% bilateral   COPD (chronic obstructive pulmonary disease) ([place])    Diverticula of colon    Ejection fraction    55-60%, echo, April, 2011, could not estimate RV pressure   Esophagitis    HOH (hard of hearing)    Hyperlipidemia    Hypertension    Myocardial infarction ([place])    x2   Shortness of breath    with exertion   Sleep apnea    Stop Bang score of 5   Tobacco abuse     Patient Active Problem List   Diagnosis Date Noted   COVID-19 virus infection 04/18/2019   COVID-19 04/17/2019   Special screening for malignant neoplasms, colon 09/21/2018   Diverticulosis of large intestine with hemorrhage    GI bleed due to NSAIDs 11/17/2017   Rectal bleed 11/16/2017   GI  bleed 11/16/2017   Hypokalemia 11/16/2017   Chest pain 05/02/2016   H. pylori infection 08/15/2015   Mucosal abnormality of stomach    Reflux esophagitis    Peptic stricture of esophagus    Rectal bleeding 05/21/2015   Dyspepsia 05/21/2015   Dysphagia, pharyngoesophageal phase 05/21/2015   Sinus bradycardia 03/19/2014   Carotid artery disease ([place]) 03/15/2014   Hypertension    Hyperlipidemia    CAD (coronary artery disease)    Abnormal CXR    Ejection fraction    Tobacco abuse    COPD (chronic obstructive pulmonary disease) ([place])     Past Surgical History:  Procedure Laterality Date   APPENDECTOMY     BIOPSY  11/17/2017   Procedure: BIOPSY;  Surgeon: Rogene Houston, MD;  Location: [facility];  Service: Endoscopy;;  gastric   CARDIAC CATHETERIZATION     CATARACT EXTRACTION Left    CATARACT EXTRACTION W/PHACO Right 01/22/2014   Procedure: CATARACT EXTRACTION PHACO AND INTRAOCULAR LENS PLACEMENT ([place]);  Surgeon: Tonny Branch, MD;  Location: [facility] ORS;  Service: Ophthalmology;  Laterality: Right;  CDE 7.46   CHOLECYSTECTOMY     8 years ago [facility]   COLONOSCOPY  10/05/2012   Dr. Laural Golden: multiple diverticula at sigmoid colon, multiple polyps (tubular adenomas), small external hemorrhoids   COLONOSCOPY N/A 11/18/2017  Procedure: COLONOSCOPY;  Surgeon: Rogene Houston, MD;  Location: [facility];  Service: Endoscopy;  Laterality: N/A;   CYST REMOVAL HAND     right wrist   ESOPHAGEAL DILATION N/A 05/23/2015   Procedure: ESOPHAGEAL DILATION;  Surgeon: Daneil Dolin, MD;  Location: [facility];  Service: Endoscopy;  Laterality: N/A;   ESOPHAGEAL DILATION N/A 11/17/2017   Procedure: ESOPHAGEAL DILATION;  Surgeon: Rogene Houston, MD;  Location: [facility];  Service: Endoscopy;  Laterality: N/A;   ESOPHAGOGASTRODUODENOSCOPY N/A 05/23/2015   Dr. Gala Romney: Ulcerative/ erosive reflux esophagitis with peptic stricture formation. Status post dilation as described. Hiatal hernia. Abnormal gastric  mucosa of uncertain significance status post gastric biospy. +H.pylori   ESOPHAGOGASTRODUODENOSCOPY N/A 11/17/2017   Procedure: ESOPHAGOGASTRODUODENOSCOPY (EGD);  Surgeon: Rogene Houston, MD;  Location: [facility];  Service: Endoscopy;  Laterality: N/A;   PERCUTANEOUS STENT INTERVENTION     SKIN CANCER EXCISION  2015   ear & neck   TONSILLECTOMY         Family History  Problem Relation Age of Onset   Heart disease Mother    Heart disease Father    Heart disease Brother    Stomach cancer Brother    Colon cancer Neg Hx     Social History   Tobacco Use   Smoking status: Former    Packs/day: 3.00    Years: 12.00    Pack years: 36.00    Types: Cigarettes    Start date: 09/15/1983    Quit date: 09/14/1994    Years since quitting: 26.6   Smokeless tobacco: Current    Types: Chew   Tobacco comments:    chews 80% of a pack tobacco per day -- 10/12 doesnt chew too much  Vaping Use   Vaping Use: Never used  Substance Use Topics   Alcohol use: No    Alcohol/week: 0.0 standard drinks   Drug use: No    Home Medications Prior to Admission medications   Medication Sig Start Date End Date Taking? Authorizing Provider  acetaminophen (TYLENOL) 500 MG tablet Take 500-1,000 mg by mouth daily as needed for moderate pain or headache.    [provider]  albuterol (PROVENTIL HFA;VENTOLIN HFA) 108 (90 Base) MCG/ACT inhaler Inhale 1-2 puffs into the lungs every 6 (six) hours as needed for wheezing or shortness of breath.    [provider]  aspirin 81 MG tablet Take 1 tablet (81 mg total) by mouth every evening. 11/20/17   Barton Dubois, MD  Cyanocobalamin (B-12 COMPLIANCE INJECTION IJ) Inject 1 Dose as directed every 30 (thirty) days.    [provider]  diphenhydrAMINE (BENADRYL) 25 MG tablet Take 12.5 mg by mouth at bedtime as needed for sleep.    [provider]  lisinopril (ZESTRIL) 5 MG tablet TAKE 1 TABLET BY MOUTH DAILY Patient taking differently:  Take 5 mg by mouth daily. 12/09/20   Arnoldo Lenis, MD  loratadine (CLARITIN) 10 MG tablet Take 10 mg by mouth daily as needed for allergies.    [provider]  Menthol, Topical Analgesic, (ICY HOT EX) Apply 1 application topically daily as needed (pain).    [provider]  nitroGLYCERIN (NITROSTAT) 0.4 MG SL tablet PLACE 1 TABLET UNDER THE TONGUE EVERY 5 MINUTES FOR 3 DOSES AS NEEDED FOR CHEST PAIN Patient taking differently: Place 0.4 mg under the tongue every 5 (five) minutes as needed for chest pain. 08/11/16   Arnoldo Lenis, MD  OVER  THE COUNTER MEDICATION Apply 1 application topically daily as needed (pain). [place] gel FOR BACK PAIN    [provider]  pantoprazole (PROTONIX) 40 MG tablet TAKE 1 TABLET(40 MG) BY MOUTH DAILY Patient taking differently: Take 40 mg by mouth daily. 01/03/21   Arnoldo Lenis, MD  simvastatin (ZOCOR) 40 MG tablet TAKE 1 TABLET(40 MG) BY MOUTH EVERY EVENING Patient taking differently: Take 40 mg by mouth every evening. 01/22/21   Branch, Alphonse Guild, MD  vitamin B-12 (CYANOCOBALAMIN) 500 MCG tablet Take 500 mcg by mouth daily.    [provider]    Allergies    Patient has no known allergies.  Review of Systems   Review of Systems  Gastrointestinal:  Positive for hematochezia.  All other systems reviewed and are negative.  Physical Exam Updated Vital Signs BP (!) 185/113   Pulse 88   Temp 98.7 F (37.1 C) (Oral)   Resp 18   Ht 6' (1.829 m)   Wt 81 kg   SpO2 97%   BMI 24.22 kg/m   Physical Exam Vitals and nursing note reviewed.  Constitutional:      General: He is not in acute distress.    Appearance: He is well-developed. He is not diaphoretic.  HENT:     Head: Normocephalic and atraumatic.  Cardiovascular:     Rate and Rhythm: Normal rate and regular rhythm.     Heart sounds: No murmur heard.   No friction rub.  Pulmonary:     Effort: Pulmonary effort is normal. No respiratory distress.      Breath sounds: Normal breath sounds. No wheezing or rales.  Abdominal:     General: Bowel sounds are normal. There is no distension.     Palpations: Abdomen is soft.     Tenderness: There is no abdominal tenderness.  Genitourinary:    Comments: Rectal examination reveals maroon and bright red-colored blood/stool.  There are no masses or obvious hemorrhoids. Musculoskeletal:        General: Normal range of motion.     Cervical back: Normal range of motion and neck supple.  Skin:    General: Skin is warm and dry.  Neurological:     Mental Status: He is alert and oriented to person, place, and time.     Coordination: Coordination normal.    ED Results / Procedures / Treatments   Labs (all labs ordered are listed, but only abnormal results are displayed) Labs Reviewed  COMPREHENSIVE METABOLIC PANEL  CBC  POC OCCULT BLOOD, ED  TYPE AND SCREEN    EKG EKG Interpretation  Date/Time:  Tuesday May 06 2021 04:11:21 EDT Ventricular Rate:  81 PR Interval:  184 QRS Duration: 99 QT Interval:  406 QTC Calculation: 472 R Axis:   3 Text Interpretation: Sinus rhythm Borderline left axis deviation Confirmed by Veryl Speak [phone]) on 05/06/2021 4:28:23 AM  Radiology No results found.  Procedures Procedures   Medications Ordered in ED Medications - No data to display  ED Course  I have reviewed the triage vital signs and the nursing notes.  Pertinent labs & imaging results that were available during my care of the patient were reviewed by me and considered in my medical decision making (see chart for details).    MDM Rules/Calculators/A&P  Patient presenting with complaints of rectal bleeding.  Patient has had several episodes of maroon and bright red stool.  He had another additional episode here in the ER.  His hemoglobin is 14.5, however I  suspect will decline given the quantity of blood passed in the ER.  At this time, he is hemodynamically stable.  Patient to be admitted  to the hospitalist service for trending of his hemoglobin and GI consultation.  Final Clinical Impression(s) / ED Diagnoses Final diagnoses:  None    Rx / DC Orders ED Discharge Orders     None        Veryl Speak, MD 05/06/21 [phone]

## 2021-05-06 NOTE — Consult Note (Signed)
$'@LOGO'a$ @   Referring Provider: [facility]  Primary Care Physician:  Celene Squibb, MD Primary Gastroenterologist:  Dr. Jenetta Downer  Date of Admission: 05/06/21 Date of Consultation: 05/06/21  Reason for Consultation:  Rectal Bleeding  HPI:  Jeremy [name] is a 85 y.o. year old male with history of coronary artery disease/MI with history of stent placement, diverticulosis with suspected episode of diverticular bleed in 2019, hyperlipidemia, HTN, OSA, COPD who presented to the emergency room morning of 8/23 due to acute onset rectal bleeding x1-2 days, associated orthostatic symptoms.  Reported similar, milder incident a month ago after having a constipated bowel movement which was followed by bloody bowel movement.  Previously hospitalized in March 2019 for GI bleeding.  EGD with grade B esophagitis, esophageal stricture s/p dilation, 2 cm hiatal hernia, gastritis in the antrum (pathology showed chronic active gastritis with H. Pylori s/p treatment with Pylera), normal duodenum.  Colonoscopy with diverticulosis and internal hemorrhoids, but preparation was overall poor with recommendations to repeat in 3 months.  He was originally scheduled for repeat colonoscopy in June, but patient canceled.  ED course: Hypertensive, otherwise hemodynamically stable, afebrile. WBC 5.4, hemoglobin 14.5, creatinine 1.12, BUN 21, glucose 110 LFTs within normal limits. FOBT positive. Respiratory panel in process.  Today:  Patient reports he started having bright red blood per rectum on Sunday.  Had a couple of episodes on Sunday, then no bowel movement on Monday.  Around 2 AM this morning, he started having large amounts of 80% blood, 20% stool per rectum.  Total of about 5-6 episodes with last episode about 1 hour ago.  Blood is red/dark red.  No melena.  Denies associated abdominal pain.  He has had a lot of gas.  Slight nausea yesterday, but nothing significant that has caused him to change his eating  habits.  Reports he did have a similar episode of rectal bleeding about 1 month ago after passing a hard stool.  Reports this was followed by a single episode of rectal bleeding, but nothing since until Sunday.  No hard stools since that time.  He has had low energy since having COVID last year, but this has been worsening recently.  Denies presyncope or syncope, chest pain, palpitations.  Chronic intermittent shortness of breath and intermittent cough without change.   GERD well controlled on Protonix 40 mg daily. Chronic intermittent dysphagia to breads or meats. Occasional regurgitation. Symptoms improved after the dilation in 2019, but returned about 1 year ago.   Took ibuprofen a couple weeks ago x 1 week. He was also started on Meloxicam about 1 month ago. This is for his right shoulder.  No blood thinners or antiplatelet medications.  Denies fever, chills, cold or flu like symptoms.   Past Medical History:  Diagnosis Date   Abnormal CXR 4/20011   Chest  CT mode/serere COPD ( no mediastinal abnormality)   CAD (coronary artery disease) 2005   anterior MI with stent to the LAD in 1991 / stent 1996 / nuclear 2005, no ischemia   Carotid bruit    Doppler, June, 2013, 0-39% bilateral   COPD (chronic obstructive pulmonary disease) ([place])    Diverticula of colon    Ejection fraction    55-60%, echo, April, 2011, could not estimate RV pressure   Esophagitis    HOH (hard of hearing)    Hyperlipidemia    Hypertension    Myocardial infarction ([place])    x2   Shortness of breath    with exertion  Sleep apnea    Stop Bang score of 5   Tobacco abuse     Past Surgical History:  Procedure Laterality Date   APPENDECTOMY     BIOPSY  11/17/2017   Procedure: BIOPSY;  Surgeon: Rogene Houston, MD;  [facility];  Service: Endoscopy;;  gastric   CARDIAC CATHETERIZATION     CATARACT EXTRACTION Left    CATARACT EXTRACTION W/PHACO Right 01/22/2014   Procedure: CATARACT EXTRACTION  PHACO AND INTRAOCULAR LENS PLACEMENT ([place]);  Surgeon: Tonny Branch, MD;  Location: [facility];  Service: Ophthalmology;  Laterality: Right;  CDE 7.46   CHOLECYSTECTOMY     8 years ago [facility]   COLONOSCOPY  10/05/2012   Dr. Laural Golden: multiple diverticula at sigmoid colon, multiple polyps (tubular adenomas), small external hemorrhoids   COLONOSCOPY N/A 11/18/2017   Procedure: COLONOSCOPY;  Surgeon: Rogene Houston, MD;  [facility];  Service: Endoscopy;  Laterality: N/A;   CYST REMOVAL HAND     right wrist   ESOPHAGEAL DILATION N/A 05/23/2015   Procedure: ESOPHAGEAL DILATION;  Surgeon: Daneil Dolin, MD;  [facility];  Service: Endoscopy;  Laterality: N/A;   ESOPHAGEAL DILATION N/A 11/17/2017   Procedure: ESOPHAGEAL DILATION;  Surgeon: Rogene Houston, MD;  [facility];  Service: Endoscopy;  Laterality: N/A;   ESOPHAGOGASTRODUODENOSCOPY N/A 05/23/2015   Dr. Gala Romney: Ulcerative/ erosive reflux esophagitis with peptic stricture formation. Status post dilation as described. Hiatal hernia. Abnormal gastric mucosa of uncertain significance status post gastric biospy. +H.pylori   ESOPHAGOGASTRODUODENOSCOPY N/A 11/17/2017   Procedure: ESOPHAGOGASTRODUODENOSCOPY (EGD);  Surgeon: Rogene Houston, MD;  [facility];  Service: Endoscopy;  Laterality: N/A;   PERCUTANEOUS STENT INTERVENTION     SKIN CANCER EXCISION  2015   ear & neck   TONSILLECTOMY      Prior to Admission medications   Medication Sig Start Date End Date Taking? Authorizing Provider  acetaminophen (TYLENOL) 500 MG tablet Take 500-1,000 mg by mouth daily as needed for moderate pain or headache.    [provider]  albuterol (PROVENTIL HFA;VENTOLIN HFA) 108 (90 Base) MCG/ACT inhaler Inhale 1-2 puffs into the lungs every 6 (six) hours as needed for wheezing or shortness of breath.    [provider]  aspirin 81 MG tablet Take 1 tablet (81 mg total) by mouth every evening. 11/20/17   Barton Dubois,  MD  Cyanocobalamin (B-12 COMPLIANCE INJECTION IJ) Inject 1 Dose as directed every 30 (thirty) days.    [provider]  diphenhydrAMINE (BENADRYL) 25 MG tablet Take 12.5 mg by mouth at bedtime as needed for sleep.    [provider]  lisinopril (ZESTRIL) 5 MG tablet TAKE 1 TABLET BY MOUTH DAILY Patient taking differently: Take 5 mg by mouth daily. 12/09/20   Arnoldo Lenis, MD  loratadine (CLARITIN) 10 MG tablet Take 10 mg by mouth daily as needed for allergies.    [provider]  Menthol, Topical Analgesic, (ICY HOT EX) Apply 1 application topically daily as needed (pain).    [provider]  nitroGLYCERIN (NITROSTAT) 0.4 MG SL tablet PLACE 1 TABLET UNDER THE TONGUE EVERY 5 MINUTES FOR 3 DOSES AS NEEDED FOR CHEST PAIN Patient taking differently: Place 0.4 mg under the tongue every 5 (five) minutes as needed for chest pain. 08/11/16   Arnoldo Lenis, MD  OVER THE COUNTER MEDICATION Apply 1 application topically daily as needed (pain). [place] gel FOR BACK PAIN    [provider]  pantoprazole (PROTONIX) 40 MG tablet TAKE 1 TABLET(40 MG) BY MOUTH DAILY Patient taking differently: Take 40 mg by mouth daily. 01/03/21   Arnoldo Lenis, MD  simvastatin (ZOCOR) 40 MG tablet TAKE 1 TABLET(40 MG) BY MOUTH EVERY EVENING Patient taking differently: Take 40 mg by mouth every evening. 01/22/21   Branch, Alphonse Guild, MD  vitamin B-12 (CYANOCOBALAMIN) 500 MCG tablet Take 500 mcg by mouth daily.    [provider]    No current facility-administered medications for this encounter.   Current Outpatient Medications  Medication Sig Dispense Refill   acetaminophen (TYLENOL) 500 MG tablet Take 500-1,000 mg by mouth daily as needed for moderate pain or headache.     albuterol (PROVENTIL HFA;VENTOLIN HFA) 108 (90 Base) MCG/ACT inhaler Inhale 1-2 puffs into the lungs every 6 (six) hours as needed for wheezing or shortness of breath.     aspirin 81 MG  tablet Take 1 tablet (81 mg total) by mouth every evening.     Cyanocobalamin (B-12 COMPLIANCE INJECTION IJ) Inject 1 Dose as directed every 30 (thirty) days.     diphenhydrAMINE (BENADRYL) 25 MG tablet Take 12.5 mg by mouth at bedtime as needed for sleep.     lisinopril (ZESTRIL) 5 MG tablet TAKE 1 TABLET BY MOUTH DAILY (Patient taking differently: Take 5 mg by mouth daily.) 90 tablet 3   loratadine (CLARITIN) 10 MG tablet Take 10 mg by mouth daily as needed for allergies.     Menthol, Topical Analgesic, (ICY HOT EX) Apply 1 application topically daily as needed (pain).     nitroGLYCERIN (NITROSTAT) 0.4 MG SL tablet PLACE 1 TABLET UNDER THE TONGUE EVERY 5 MINUTES FOR 3 DOSES AS NEEDED FOR CHEST PAIN (Patient taking differently: Place 0.4 mg under the tongue every 5 (five) minutes as needed for chest pain.) 75 tablet 3   OVER THE COUNTER MEDICATION Apply 1 application topically daily as needed (pain). [place] gel FOR BACK PAIN     pantoprazole (PROTONIX) 40 MG tablet TAKE 1 TABLET(40 MG) BY MOUTH DAILY (Patient taking differently: Take 40 mg by mouth daily.) 90 tablet 1   simvastatin (ZOCOR) 40 MG tablet TAKE 1 TABLET(40 MG) BY MOUTH EVERY EVENING (Patient taking differently: Take 40 mg by mouth every evening.) 90 tablet 3   vitamin B-12 (CYANOCOBALAMIN) 500 MCG tablet Take 500 mcg by mouth daily.      Allergies as of 05/06/2021   (No Known Allergies)    Family History  Problem Relation Age of Onset   Heart disease Mother    Heart disease Father    Heart disease Brother    Stomach cancer Brother    Colon cancer Neg Hx     Social History   Socioeconomic History   Marital status: Married    Spouse name: Not on file   Number of children: Not on file   Years of education: Not on file   Highest education level: Not on file  Occupational History   Not on file  Tobacco Use   Smoking status: Former    Packs/day: 3.00    Years: 12.00    Pack years: 36.00    Types: Cigarettes    Start  date: 09/15/1983    Quit date: 09/14/1994    Years since quitting: 26.6   Smokeless tobacco: Current    Types: Chew   Tobacco comments:    chews 80% of a pack tobacco per day -- 10/12 doesnt chew too much  Vaping  Use   Vaping Use: Never used  Substance and Sexual Activity   Alcohol use: No    Alcohol/week: 0.0 standard drinks   Drug use: No   Sexual activity: Yes    Partners: Female  Other Topics Concern   Not on file  Social History Narrative   Not on file   Social Determinants of Health   Financial Resource Strain: Not on file  Food Insecurity: Not on file  Transportation Needs: Not on file  Physical Activity: Not on file  Stress: Not on file  Social Connections: Not on file  Intimate Partner Violence: Not on file    Review of Systems: Gen: Denies fever, chills, cold or flulike symptoms, presyncope, syncope. CV: See HPI Resp: See HPI GI: See HPI GU : Denies urinary burning, urinary frequency, urinary incontinence.  MS: Admits to right shoulder pain. Derm: Denies rash Psych: Denies depression, anxiety Heme: See HPI  Physical Exam: Vital signs in last 24 hours: Temp:  [98.7 F (37.1 C)] 98.7 F (37.1 C) (08/23 0404) Pulse Rate:  [71-88] 71 (08/23 0430) Resp:  [18] 18 (08/23 0404) BP: (155-185)/(87-113) 155/91 (08/23 0500) SpO2:  [94 %-97 %] 94 % (08/23 0430) Weight:  [81 kg] 81 kg (08/23 0402)   General:   Alert,  Well-developed, well-nourished, pleasant and cooperative in NAD Head:  Normocephalic and atraumatic. Eyes:  Sclera clear, no icterus.   Conjunctiva pink. Ears:  Decreased auditory acuity. Lungs:  Clear throughout to auscultation. No wheezes, crackles, or rhonchi. No acute distress. Heart:  Regular rate and rhythm; no murmurs, clicks, rubs,  or gallops. Abdomen:  Soft, nontender and nondistended. No masses, hepatosplenomegaly or hernias noted. Normal bowel sounds, without guarding, and without rebound.   Rectal:  Deferred until time of colonoscopy.    Msk:  Symmetrical without gross deformities. Normal posture. Extremities:  Without  edema. Neurologic:  Alert and  oriented x4;  grossly normal neurologically. Skin:  Intact without significant lesions or rashes. Psych:  Normal mood and affect.  Intake/Output from previous day: No intake/output data recorded. Intake/Output this shift: No intake/output data recorded.  Lab Results: Recent Labs    05/06/21 0400  WBC 5.4  HGB 14.5  HCT 43.2  PLT 196   BMET Recent Labs    05/06/21 0400  NA 140  K 3.8  CL 107  CO2 29  GLUCOSE 110*  BUN 21  CREATININE 1.12  CALCIUM 8.3*   LFT Recent Labs    05/06/21 0400  PROT 6.2*  ALBUMIN 3.7  AST 14*  ALT 10  ALKPHOS 55  BILITOT 0.9    Impression: 85 y.o. year old male with history of coronary artery disease/MI with history of stent placement, diverticulosis with episode of suspected diverticular bleed in 2019, H. pylori s/p treatment with Pylera, GERD, hyperlipidemia, HTN, OSA, COPD who presented to the emergency room morning of 8/23 due to acute onset rectal bleeding with associated lack of energy.  He was found to have hemoglobin of 14.5, FOBT positive.  GI consulted for further evaluation.  Rectal bleeding: Acute onset red/dark red blood per rectum on Sunday x2 episodes, none on Monday, then large amounts of rectal bleeding starting at 2 AM this morning with a total of 5-6 episodes thus far.  Denies melena or associated abdominal pain.  Slight nausea yesterday, but nothing significant.  He did have small amount of rectal bleeding about 1 month ago after he passed a hard stool, but nothing since that time until 3 days  ago. Denies routine constipation. Also reports recently starting meloxicam daily about 1 month ago and taking ibuprofen x1 week about 2 weeks ago for his right shoulder.  He has history of suspected diverticular bleed in 2019 with colonoscopy at that time revealing diverticulosis and internal hemorrhoids, but preparation  was overall poor with recommendations to repeat in 3 months, but this has not been completed.  He was originally scheduled for repeat colonoscopy in June, but this was canceled due to the loss of his son.  Overall, I suspect recurrent diverticular bleed, possibly secondary to NSAID use, but can't rule out colon polyps or malignancy considering poor prep in 2019. Doubt rapid transit upper GI bleed. BUN normal. Repeat Hg this this morning down to 12.8 from 14.5 on admission. This is likely equilibration from bleeding events this morning. We will continue to trend H/H closely. Would recommend colonoscopy for complete evaluation of his colon. Likely tomorrow. Will discuss with Dr. Jenetta Downer.   Dysphagia: History of dysphagia with esophageal stricture s/p dilation in 2019.  Patient reports improvement in symptoms after dilation, but over the last year, he has had return of solid food dysphagia.  Typically, symptoms occur with meats and breads occasionally requiring regurgitation.  He needs repeat EGD with possible dilation for further evaluation and therapeutic intervention.  I suspect this could likely be completed at the same time as colonoscopy this admission.   Plan: Clear liquid diet today. Recommend colonoscopy for further evaluation of rectal bleeding and complete surveillance of his colon due to poor prep in 2019. Anticipate procedures will be tomorrow.  Will discuss with Dr. Jenetta Downer. Patient would also benefit from EGD with possible dilation this admission which can be completed at the time of colonoscopy. I have discussed the risks, benefits, and alternatives of colonoscopy and EGD with the patient in detail. The patient states understanding and desires to proceed.  Can continue IV Protonix BID though doubt upper GI bleed.  Follow H&H every 6 hours.  Monitor for ongoing GI bleeding. Avoid NSAIDs.   LOS: 0 days    05/06/2021, 7:55 AM   Aliene Altes, PA-C [facility]

## 2021-05-07 ENCOUNTER — Observation Stay (HOSPITAL_COMMUNITY): Payer: Medicare Other | Admitting: Anesthesiology

## 2021-05-07 ENCOUNTER — Encounter (HOSPITAL_COMMUNITY): Payer: Self-pay | Admitting: Family Medicine

## 2021-05-07 ENCOUNTER — Encounter (HOSPITAL_COMMUNITY)
Admission: EM | Disposition: A | Discharge status: Home / Self Care | Payer: Self-pay | Source: Home / Self Care | Attending: Internal Medicine

## 2021-05-07 DIAGNOSIS — Z20822 Contact with and (suspected) exposure to covid-19: Secondary | ICD-10-CM | POA: Diagnosis present

## 2021-05-07 DIAGNOSIS — J449 Chronic obstructive pulmonary disease, unspecified: Secondary | ICD-10-CM | POA: Diagnosis present

## 2021-05-07 DIAGNOSIS — Z8249 Family history of ischemic heart disease and other diseases of the circulatory system: Secondary | ICD-10-CM | POA: Diagnosis not present

## 2021-05-07 DIAGNOSIS — Z79899 Other long term (current) drug therapy: Secondary | ICD-10-CM | POA: Diagnosis not present

## 2021-05-07 DIAGNOSIS — K625 Hemorrhage of anus and rectum: Secondary | ICD-10-CM | POA: Diagnosis not present

## 2021-05-07 DIAGNOSIS — Z634 Disappearance and death of family member: Secondary | ICD-10-CM | POA: Diagnosis not present

## 2021-05-07 DIAGNOSIS — Z7982 Long term (current) use of aspirin: Secondary | ICD-10-CM | POA: Diagnosis not present

## 2021-05-07 DIAGNOSIS — K922 Gastrointestinal hemorrhage, unspecified: Secondary | ICD-10-CM | POA: Diagnosis not present

## 2021-05-07 DIAGNOSIS — Z87891 Personal history of nicotine dependence: Secondary | ICD-10-CM | POA: Diagnosis not present

## 2021-05-07 DIAGNOSIS — I2510 Atherosclerotic heart disease of native coronary artery without angina pectoris: Secondary | ICD-10-CM | POA: Diagnosis present

## 2021-05-07 DIAGNOSIS — Z85828 Personal history of other malignant neoplasm of skin: Secondary | ICD-10-CM | POA: Diagnosis not present

## 2021-05-07 DIAGNOSIS — Z8616 Personal history of COVID-19: Secondary | ICD-10-CM | POA: Diagnosis not present

## 2021-05-07 DIAGNOSIS — Z955 Presence of coronary angioplasty implant and graft: Secondary | ICD-10-CM | POA: Diagnosis not present

## 2021-05-07 DIAGNOSIS — Z9049 Acquired absence of other specified parts of digestive tract: Secondary | ICD-10-CM | POA: Diagnosis not present

## 2021-05-07 DIAGNOSIS — I252 Old myocardial infarction: Secondary | ICD-10-CM | POA: Diagnosis not present

## 2021-05-07 DIAGNOSIS — K222 Esophageal obstruction: Secondary | ICD-10-CM | POA: Diagnosis not present

## 2021-05-07 DIAGNOSIS — K449 Diaphragmatic hernia without obstruction or gangrene: Secondary | ICD-10-CM | POA: Diagnosis present

## 2021-05-07 DIAGNOSIS — K5731 Diverticulosis of large intestine without perforation or abscess with bleeding: Secondary | ICD-10-CM | POA: Diagnosis present

## 2021-05-07 DIAGNOSIS — E785 Hyperlipidemia, unspecified: Secondary | ICD-10-CM | POA: Diagnosis present

## 2021-05-07 DIAGNOSIS — Z800 Family history of malignant neoplasm of digestive organs: Secondary | ICD-10-CM | POA: Diagnosis not present

## 2021-05-07 DIAGNOSIS — I10 Essential (primary) hypertension: Secondary | ICD-10-CM | POA: Diagnosis present

## 2021-05-07 DIAGNOSIS — D62 Acute posthemorrhagic anemia: Secondary | ICD-10-CM | POA: Diagnosis present

## 2021-05-07 DIAGNOSIS — K2100 Gastro-esophageal reflux disease with esophagitis, without bleeding: Secondary | ICD-10-CM | POA: Diagnosis present

## 2021-05-07 DIAGNOSIS — K2970 Gastritis, unspecified, without bleeding: Secondary | ICD-10-CM | POA: Diagnosis present

## 2021-05-07 DIAGNOSIS — R1314 Dysphagia, pharyngoesophageal phase: Secondary | ICD-10-CM | POA: Diagnosis present

## 2021-05-07 DIAGNOSIS — K6289 Other specified diseases of anus and rectum: Secondary | ICD-10-CM | POA: Diagnosis not present

## 2021-05-07 DIAGNOSIS — K56699 Other intestinal obstruction unspecified as to partial versus complete obstruction: Secondary | ICD-10-CM | POA: Diagnosis not present

## 2021-05-07 DIAGNOSIS — H9190 Unspecified hearing loss, unspecified ear: Secondary | ICD-10-CM | POA: Diagnosis present

## 2021-05-07 DIAGNOSIS — K5730 Diverticulosis of large intestine without perforation or abscess without bleeding: Secondary | ICD-10-CM | POA: Diagnosis not present

## 2021-05-07 DIAGNOSIS — K5900 Constipation, unspecified: Secondary | ICD-10-CM | POA: Diagnosis present

## 2021-05-07 HISTORY — PX: ESOPHAGOGASTRODUODENOSCOPY (EGD) WITH PROPOFOL: SHX5813

## 2021-05-07 HISTORY — PX: ESOPHAGEAL DILATION: SHX303

## 2021-05-07 HISTORY — PX: COLONOSCOPY WITH PROPOFOL: SHX5780

## 2021-05-07 LAB — COMPREHENSIVE METABOLIC PANEL
ALT: 10 U/L (ref 0–44)
AST: 13 U/L — ABNORMAL LOW (ref 15–41)
Albumin: 3.2 g/dL — ABNORMAL LOW (ref 3.5–5.0)
Alkaline Phosphatase: 55 U/L (ref 38–126)
Anion gap: 6 (ref 5–15)
BUN: 18 mg/dL (ref 8–23)
CO2: 28 mmol/L (ref 22–32)
Calcium: 8 mg/dL — ABNORMAL LOW (ref 8.9–10.3)
Chloride: 105 mmol/L (ref 98–111)
Creatinine, Ser: 0.99 mg/dL (ref 0.61–1.24)
GFR, Estimated: >60 mL/min (ref >60)
Glucose, Bld: 96 mg/dL (ref 70–99)
Potassium: 3.8 mmol/L (ref 3.5–5.1)
Sodium: 139 mmol/L (ref 135–145)
Total Bilirubin: 1.4 mg/dL — ABNORMAL HIGH (ref 0.3–1.2)
Total Protein: 5.2 g/dL — ABNORMAL LOW (ref 6.5–8.1)

## 2021-05-07 LAB — HEMOGLOBIN AND HEMATOCRIT, BLOOD
HCT: 35.3 % — ABNORMAL LOW (ref 39.0–52.0)
HCT: 36.5 % — ABNORMAL LOW (ref 39.0–52.0)
Hemoglobin: 11.8 g/dL — ABNORMAL LOW (ref 13.0–17.0)
Hemoglobin: 12.3 g/dL — ABNORMAL LOW (ref 13.0–17.0)

## 2021-05-07 LAB — MAGNESIUM: Magnesium: 1.9 mg/dL (ref 1.7–2.4)

## 2021-05-07 LAB — PROTIME-INR
INR: 1.2 (ref 0.8–1.2)
Prothrombin Time: 15.4 seconds — ABNORMAL HIGH (ref 11.4–15.2)

## 2021-05-07 SURGERY — ESOPHAGOGASTRODUODENOSCOPY (EGD) WITH PROPOFOL
Anesthesia: General

## 2021-05-07 MED ORDER — EPHEDRINE 5 MG/ML INJ
INTRAVENOUS | Status: AC
  Filled 2021-05-07: qty 5

## 2021-05-07 MED ORDER — LACTATED RINGERS IV SOLN: INTRAVENOUS | Status: DC

## 2021-05-07 MED ORDER — PROPOFOL 500 MG/50ML IV EMUL
INTRAVENOUS | Status: DC | PRN
  Administered 2021-05-07: 150 ug/kg/min via INTRAVENOUS

## 2021-05-07 MED ORDER — PROPOFOL 10 MG/ML IV BOLUS
INTRAVENOUS | Status: AC
  Filled 2021-05-07: qty 20

## 2021-05-07 MED ORDER — SODIUM CHLORIDE 0.9 % IV SOLN: INTRAVENOUS | Status: DC

## 2021-05-07 MED ORDER — STERILE WATER FOR IRRIGATION IR SOLN
Status: DC | PRN
  Administered 2021-05-07: 300 mL

## 2021-05-07 MED ORDER — PROPOFOL 10 MG/ML IV BOLUS
INTRAVENOUS | Status: DC | PRN
  Administered 2021-05-07 (×2): 50 mg via INTRAVENOUS
  Administered 2021-05-07: 20 mg via INTRAVENOUS
  Administered 2021-05-07: 50 mg via INTRAVENOUS
  Administered 2021-05-07: 30 mg via INTRAVENOUS
  Administered 2021-05-07: 50 mg via INTRAVENOUS

## 2021-05-07 MED ORDER — LACTATED RINGERS IV SOLN: INTRAVENOUS | Status: DC | PRN

## 2021-05-07 MED ORDER — PSYLLIUM 95 % PO PACK
1.0000 | PACK | Freq: Every day | ORAL | Status: DC
  Administered 2021-05-07: 1 via ORAL
  Filled 2021-05-07 (×2): qty 1

## 2021-05-07 NOTE — Care Management Obs Status (Signed)
[facility] NOTIFICATION   Patient Details  Name: Jeremy [name] MRN: [id number] Date of Birth: 1935/12/26   Medicare Observation Status Notification Given:  Yes    Tommy Medal 05/07/2021, 2:10 PM

## 2021-05-07 NOTE — Op Note (Signed)
[facility] Patient Name: Jeremy [name] Procedure Date: 05/07/2021 1:04 PM MRN: [id number] Date of Birth: 05-28-1936 Attending MD: Hildred Laser , MD CSN: [id number] Age: 85 Admit Type: Inpatient Procedure:                Colonoscopy Indications:              Rectal bleeding Providers:                Hildred Laser, MD, Lambert Mody, Casimer Bilis, Technician Referring MD:             Elmarie Shiley, MD Medicines:                Propofol per Anesthesia Complications:            No immediate complications. Estimated Blood Loss:     Estimated blood loss: none. Procedure:                Pre-Anesthesia Assessment:                           - Prior to the procedure, a History and Physical                            was performed, and patient medications and                            allergies were reviewed. The patient's tolerance of                            previous anesthesia was also reviewed. The risks                            and benefits of the procedure and the sedation                            options and risks were discussed with the patient.                            All questions were answered, and informed consent                            was obtained. Prior Anticoagulants: The patient has                            taken no previous anticoagulant or antiplatelet                            agents except for aspirin. ASA Grade Assessment:                            III - A patient with severe systemic disease. After  reviewing the risks and benefits, the patient was                            deemed in satisfactory condition to undergo the                            procedure.                           After obtaining informed consent, the colonoscope                            was passed under direct vision. Throughout the                            procedure, the patient's blood pressure, pulse,  and                            oxygen saturations were monitored continuously. The                            [id number]) scope was introduced through                            the anus and advanced into sigmoid colon revealing                            multiple diverticula and noncritical stricture at                            30 cm from the anal margin. Scope was changed to                            ultraslim and advanced to the the cecum, identified                            by appendiceal orifice and ileocecal valve.                            Terminal ileum was also examined. The patient                            tolerated the procedure well. The quality of the                            bowel preparation was adequate. The terminal ileum,                            ileocecal valve, appendiceal orifice, and rectum                            were photographed. The colonoscopy was technically  difficult and complex due to multiple diverticula                            in the colon and a tortuous colonand non-critical                            stricture. . Scope In: 1:05:56 PM Scope Out: 1:36:02 PM Scope Withdrawal Time: 0 hours 9 minutes 58 seconds  Total Procedure Duration: 0 hours 30 minutes 6 seconds  Findings:      The perianal and digital rectal examinations were normal.      The terminal ileum appeared normal.      Multiple small and large-mouthed diverticula were found in the sigmoid       colon.      A benign-appearing, intrinsic mild stenosis measuring less than one cm       (in length) was found in the distal sigmoid colon and was traversed.      Anal papilla(e) were hypertrophied.      No additional abnormalities were found on retroflexion. Impression:               - The examined portion of the ileum was normal.                           - Diverticulosis in the sigmoid colon.                           - Noncritical stricture in  the distal sigmoid colon                            felt to be secondary to diverticular disease.                           - Anal papilla(e) were hypertrophied.                           - No specimens collected.                           Comment: Suspect diverticular bleeding which has                            stopped spontaneously. Moderate Sedation:      Per Anesthesia Care Recommendation:           - Return patient to hospital ward for ongoing care.                           - Cardiac diet today.                           - Continue present medications.                           - Metamucil 4 g by mouth daily at bedtime.                           - Resume aspirin  in 3 days.                           - Keep meloxicam use to minimum if it has to be                            continued.                           - No repeat colonoscopy due to age and the absence                            of advanced adenomas. Procedure Code(s):        --- Professional ---                           [phone], Colonoscopy, flexible; diagnostic, including                            collection of specimen(s) by brushing or washing,                            when performed (separate procedure) Diagnosis Code(s):        --- Professional ---                           [id number], Other intestinal obstruction unspecified                            as to partial versus complete obstruction                           K62.89, Other specified diseases of anus and rectum                           K62.5, Hemorrhage of anus and rectum                           K57.30, Diverticulosis of large intestine without                            perforation or abscess without bleeding CPT copyright 2019 American [employer]. All rights reserved. The codes documented in this report are preliminary and upon coder review may  be revised to meet current compliance requirements. Hildred Laser, MD Hildred Laser, MD 05/07/2021  2:21:49 PM This report has been signed electronically. Number of Addenda: 0

## 2021-05-07 NOTE — Progress Notes (Signed)
Presenting complaint;   Subjective:  Patient has no complaints.  He did not pass any blood when he took the prep.  He states that he was able to swallow much better after last dilation in March 2019.  He has had difficulty for the last few months.  Is been intermittent.  He is not sure how long he has been on meloxicam.  He is also on low-dose aspirin.  Current Medications:  Current Facility-Administered Medications:    0.9 %  sodium chloride infusion, , Intravenous, Continuous, Jodi Mourning, Kristen S, PA-C   [MAR Hold] acetaminophen (TYLENOL) tablet 650 mg, 650 mg, Oral, Q6H PRN **OR** [MAR Hold] acetaminophen (TYLENOL) suppository 650 mg, 650 mg, Rectal, Q6H PRN, Zierle-Ghosh, Asia B, DO   [MAR Hold] albuterol (VENTOLIN HFA) 108 (90 Base) MCG/ACT inhaler 1-2 puff, 1-2 puff, Inhalation, Q6H PRN, Zierle-Ghosh, Asia B, DO   [MAR Hold] lisinopril (ZESTRIL) tablet 5 mg, 5 mg, Oral, Daily, Zierle-Ghosh, Asia B, DO, 5 mg at 05/07/21 1116   [MAR Hold] morphine 2 MG/ML injection 2 mg, 2 mg, Intravenous, Q2H PRN, Zierle-Ghosh, Asia B, DO   [MAR Hold] mupirocin ointment (BACTROBAN) 2 % 1 application, 1 application, Nasal, BID, Memon, Jolaine Artist, MD, 1 application at 97/02/63 1121   [MAR Hold] ondansetron (ZOFRAN) tablet 4 mg, 4 mg, Oral, Q6H PRN **OR** [MAR Hold] ondansetron (ZOFRAN) injection 4 mg, 4 mg, Intravenous, Q6H PRN, Zierle-Ghosh, Asia B, DO   [MAR Hold] oxyCODONE (Oxy IR/ROXICODONE) immediate release tablet 5 mg, 5 mg, Oral, Q4H PRN, Zierle-Ghosh, Asia B, DO   [MAR Hold] pantoprazole (PROTONIX) injection 40 mg, 40 mg, Intravenous, Q12H, Zierle-Ghosh, Asia B, DO, 40 mg at 05/07/21 1116   [MAR Hold] simvastatin (ZOCOR) tablet 40 mg, 40 mg, Oral, QPM, Zierle-Ghosh, Asia B, DO, 40 mg at 05/06/21 1851   Objective: Blood pressure 130/78, pulse 67, temperature 98.8 F (37.1 C), temperature source Oral, resp. rate (!) 24, height 6' (1.829 m), weight 80 kg, SpO2 92 %. Patient is alert and in no acute  distress. He has hearing impairment. Conjunctiva is pink. Sclera is nonicteric Oropharyngeal mucosa is normal. He is edentulous in lower jaw.  He has upper dentures in place. No neck masses or thyromegaly noted. Cardiac exam with regular rhythm normal S1 and S2. No murmur or gallop noted. Lungs are clear to auscultation. Abdomen is soft and nontender with organomegaly or masses. No LE edema or clubbing noted.  Labs/studies Results:   CBC Latest Ref Rng & Units 05/07/2021 05/07/2021 05/06/2021  WBC 4.0 - 10.5 K/uL - - -  Hemoglobin 13.0 - 17.0 g/dL 12.3(L) 11.8(L) 13.7  Hematocrit 39.0 - 52.0 % 36.5(L) 35.3(L) 41.0  Platelets 150 - 400 K/uL - - -    CMP Latest Ref Rng & Units 05/07/2021 05/06/2021 04/19/2019  Glucose 70 - 99 mg/dL 96 110(H) 134(H)  BUN 8 - 23 mg/dL 18 21 28(H)  Creatinine 0.61 - 1.24 mg/dL 0.99 1.12 0.85  Sodium 135 - 145 mmol/L 139 140 137  Potassium 3.5 - 5.1 mmol/L 3.8 3.8 4.1  Chloride 98 - 111 mmol/L 105 107 103  CO2 22 - 32 mmol/L 28 29 27   Calcium 8.9 - 10.3 mg/dL 8.0(L) 8.3(L) 8.3(L)  Total Protein 6.5 - 8.1 g/dL 5.2(L) 6.2(L) 5.7(L)  Total Bilirubin 0.3 - 1.2 mg/dL 1.4(H) 0.9 0.4  Alkaline Phos 38 - 126 U/L 55 78 57  AST 15 - 41 U/L 13(L) 14(L) 16  ALT 0 - 44 U/L 10 10 14  Hepatic Function Latest Ref Rng & Units 05/07/2021 05/06/2021 04/19/2019  Total Protein 6.5 - 8.1 g/dL 5.2(L) 6.2(L) 5.7(L)  Albumin 3.5 - 5.0 g/dL 3.2(L) 3.7 3.0(L)  AST 15 - 41 U/L 13(L) 14(L) 16  ALT 0 - 44 U/L 10 10 14   Alk Phosphatase 38 - 126 U/L 55 78 57  Total Bilirubin 0.3 - 1.2 mg/dL 1.4(H) 0.9 0.4      Assessment:  #1.  GI bleed.  Patient's hemoglobin has dropped and significantly.  Suspect diverticular bleed but he could also have GI bleed from upper source given that he is on aspirin and NSAID.  He has a history of colonic adenomas.  Last colonoscopy could not be completed because of poor prep.  He could not return for repeat exam in 3 months as planned.  #2.   Esophageal dysphagia.  He has a history of esophageal stricture which was dilated back in March 2019.  #3.  Coronary artery disease.  Remote LAD stenting.  Patient is on low-dose aspirin which is on hold.  #4.  COPD.  Patient appears to be at baseline.  Plan:  Proceed with esophagogastroduodenoscopy with esophageal dilation followed by diagnostic colonoscopy under monitored anesthesia care.

## 2021-05-07 NOTE — Anesthesia Preprocedure Evaluation (Signed)
Anesthesia Evaluation  Patient identified by MRN, date of birth, ID band Patient awake    Reviewed: Allergy & Precautions, NPO status , Patient's Chart, lab work & pertinent test results  History of Anesthesia Complications Negative for: history of anesthetic complications  Airway Mallampati: II  TM Distance: >3 FB Neck ROM: Full    Dental  (+) Dental Advisory Given, Upper Dentures   Pulmonary shortness of breath, sleep apnea , COPD, former smoker,    Pulmonary exam normal breath sounds clear to auscultation       Cardiovascular Exercise Tolerance: Good hypertension, Pt. on medications + CAD, + Past MI and + Cardiac Stents  Normal cardiovascular exam Rhythm:Regular Rate:Normal  ELDRIGE, SLADER [id number] 06-May-2021 04:11:21 [facility] ROUTINE RECORD 07/29/1936 (89 yr) Male Caucasian Room:ER3 Loc:499 Technician: Test ind: Comment 1:: Comment 2:: Comment 3:: Comment 4:: Vent. rate 81 BPM PR interval 184 ms QRS duration 99 ms QT/QTcB 406/472 ms P-R-T axes 55 3 62 Sinus rhythm Borderline left axis deviation Confirmed by Veryl Speak [phone]) on 05/06/2021 4:28:23 AM   Neuro/Psych    GI/Hepatic negative GI ROS, Neg liver ROS,   Endo/Other  negative endocrine ROS  Renal/GU negative Renal ROS     Musculoskeletal negative musculoskeletal ROS (+)   Abdominal   Peds  Hematology negative hematology ROS (+)   Anesthesia Other Findings   Reproductive/Obstetrics negative OB ROS                             Anesthesia Physical Anesthesia Plan  ASA: 3  Anesthesia Plan: General   Post-op Pain Management:    Induction: Intravenous  PONV Risk Score and Plan: Propofol infusion  Airway Management Planned: Natural Airway and Nasal Cannula  Additional Equipment:   Intra-op Plan:   Post-operative Plan:   Informed Consent: I have reviewed the patients History and  Physical, chart, labs and discussed the procedure including the risks, benefits and alternatives for the proposed anesthesia with the patient or authorized representative who has indicated his/her understanding and acceptance.     Dental advisory given  Plan Discussed with: CRNA and Surgeon  Anesthesia Plan Comments:         Anesthesia Quick Evaluation

## 2021-05-07 NOTE — Progress Notes (Signed)
PROGRESS NOTE    Jeremy [name]  [id number] DOB: 1936/03/23 DOA: 05/06/2021 PCP: Celene Squibb, MD   Brief Narrative: 85 year old with past medical history significant for CAD status post MI, COPD, tobacco use, hypertension, high diverticulosis who presents to the ED with bright red blood per rectum for 2 days following a constipated hard stool.  This was associated with nausea and orthostatic lightheadedness prompting ED evaluation early the morning of admission.  He continued to have bright red bloody stool in the ED, though his hemoglobin is 14, he is symptomatic from blood loss and was admitted to the hospital for further evaluation.     Assessment & Plan:   Active Problems:   Hypertension   CAD (coronary artery disease)   Tobacco abuse   Dysphagia   GI bleed  1-Painless Hematochezia, Acute blood loss anemia, Acute Diverticular Bleed: -Hemoglobin has decreased to 11. -Underwent colonoscopy which showed numerous diverticula sigmoid colon with no critical stricture at 30 cm from the anal margin transferred with ultraslim scope.  No polyp or other abnormalities noted. -Endoscopy: Moderate distal esophageal stricture dilated with a balloon dilated to 18 mm.  A small sliding hiatal hernia. Plan to observe overnight  2-CAD status post stent, initially LAD 1991: Denies chest pain. Ok to resume aspirin in 3 days per GI  Hypertension: Continue low-dose lisinopril. HLD;  Continue with statin COPD without exacerbation: Continue with duo nebs Tabacco Use; Declined nicotine patch counseling provided     Estimated body mass index is 23.92 kg/m as calculated from the following:   Height as of this encounter: 6' (1.829 m).   Weight as of this encounter: 80 kg.   DVT prophylaxis: SCD Code Status: Full Code Family Communication: care discussed with patient and wife who was at bedisde Disposition Plan:  Status is: Inpatient  Remains inpatient appropriate because:IV treatments  appropriate due to intensity of illness or inability to take PO  Dispo: The patient is from: Home              Anticipated d/c is to: Home              Patient currently is not medically stable to d/c.   Difficult to place patient No        Consultants:  GI  Procedures:  Endoscopy, colonoscopy  Antimicrobials:    Subjective: He denies further bloody stool since yesterday.  Denies abdominal pain.   Objective: Vitals:   05/07/21 1347 05/07/21 1348 05/07/21 1400 05/07/21 1430  BP:  118/72 130/71 (!) 131/59  Pulse:  72 66 69  Resp:  (!) 25 (!) 24   Temp: (!) 97.5 F (36.4 C)   98 F (36.7 C)  TempSrc:    Oral  SpO2:  99% 98% 100%  Weight:      Height:        Intake/Output Summary (Last 24 hours) at 05/07/2021 1504 Last data filed at 05/07/2021 1400 Gross per 24 hour  Intake 650 ml  Output 0 ml  Net 650 ml   Filed Weights   05/06/21 1755 05/07/21 0416 05/07/21 1209  Weight: 81.8 kg 80 kg 80 kg    Examination:  General exam: Appears calm and comfortable  Respiratory system: Clear to auscultation. Respiratory effort normal. Cardiovascular system: S1 & S2 heard, RRR. No JVD, murmurs, rubs, gallops or clicks. No pedal edema. Gastrointestinal system: Abdomen is nondistended, soft and nontender. No organomegaly or masses felt. Normal bowel sounds heard. Central nervous system: Alert and  oriented.  Extremities: Symmetric 5 x 5 power.   Data Reviewed: I have personally reviewed following labs and imaging studies  CBC: Recent Labs  Lab 05/06/21 0400 05/06/21 0947 05/06/21 1500 05/06/21 2056 05/07/21 0259 05/07/21 0900  WBC 5.4 5.7  --   --   --   --   HGB 14.5 12.8* 12.8* 13.7 11.8* 12.3*  HCT 43.2 38.9* 38.0* 41.0 35.3* 36.5*  MCV 91.1 92.8  --   --   --   --   PLT 196 186  --   --   --   --    Basic Metabolic Panel: Recent Labs  Lab 05/06/21 0400 05/07/21 0259  NA 140 139  K 3.8 3.8  CL 107 105  CO2 29 28  GLUCOSE 110* 96  BUN 21 18   CREATININE 1.12 0.99  CALCIUM 8.3* 8.0*  MG  --  1.9   GFR: Estimated Creatinine Clearance: 62.1 mL/min (by C-G formula based on SCr of 0.99 mg/dL). Liver Function Tests: Recent Labs  Lab 05/06/21 0400 05/07/21 0259  AST 14* 13*  ALT 10 10  ALKPHOS 78 55  BILITOT 0.9 1.4*  PROT 6.2* 5.2*  ALBUMIN 3.7 3.2*   No results for input(s): LIPASE, AMYLASE in the last 168 hours. No results for input(s): AMMONIA in the last 168 hours. Coagulation Profile: Recent Labs  Lab 05/07/21 0259  INR 1.2   Cardiac Enzymes: No results for input(s): CKTOTAL, CKMB, CKMBINDEX, TROPONINI in the last 168 hours. BNP (last 3 results) No results for input(s): PROBNP in the last 8760 hours. HbA1C: No results for input(s): HGBA1C in the last 72 hours. CBG: No results for input(s): GLUCAP in the last 168 hours. Lipid Profile: No results for input(s): CHOL, HDL, LDLCALC, TRIG, CHOLHDL, LDLDIRECT in the last 72 hours. Thyroid Function Tests: No results for input(s): TSH, T4TOTAL, FREET4, T3FREE, THYROIDAB in the last 72 hours. Anemia Panel: No results for input(s): VITAMINB12, FOLATE, FERRITIN, TIBC, IRON, RETICCTPCT in the last 72 hours. Sepsis Labs: No results for input(s): PROCALCITON, LATICACIDVEN in the last 168 hours.  Recent Results (from the past 240 hour(s))  Resp Panel by RT-PCR (Flu A&B, Covid) Nasopharyngeal Swab     Status: None   Collection Time: 05/06/21  5:25 AM   Specimen: Nasopharyngeal Swab; Nasopharyngeal(NP) swabs in vial transport medium  Result Value Ref Range Status   SARS Coronavirus 2 by RT PCR NEGATIVE NEGATIVE Final    Comment: (NOTE) SARS-CoV-2 target nucleic acids are NOT DETECTED.  The SARS-CoV-2 RNA is generally detectable in upper respiratory specimens during the acute phase of infection. The lowest concentration of SARS-CoV-2 viral copies this assay can detect is 138 copies/mL. A negative result does not preclude SARS-Cov-2 infection and should not be used as  the sole basis for treatment or other patient management decisions. A negative result may occur with  improper specimen collection/handling, submission of specimen other than nasopharyngeal swab, presence of viral mutation(s) within the areas targeted by this assay, and inadequate number of viral copies(<138 copies/mL). A negative result must be combined with clinical observations, patient history, and epidemiological information. The expected result is Negative.  Fact Sheet for Patients:  EntrepreneurPulse.com.au  Fact Sheet for Healthcare Providers:  IncredibleEmployment.be  This test is no t yet approved or cleared by the [place] FDA and  has been authorized for detection and/or diagnosis of SARS-CoV-2 by FDA under an Emergency Use Authorization (EUA). This EUA will remain  in effect (meaning this test can  be used) for the duration of the COVID-19 declaration under Section 564(b)(1) of the Act, 21 U.S.C.section 360bbb-3(b)(1), unless the authorization is terminated  or revoked sooner.       Influenza A by PCR NEGATIVE NEGATIVE Final   Influenza B by PCR NEGATIVE NEGATIVE Final    Comment: (NOTE) The Xpert Xpress SARS-CoV-2/FLU/RSV plus assay is intended as an aid in the diagnosis of influenza from Nasopharyngeal swab specimens and should not be used as a sole basis for treatment. Nasal washings and aspirates are unacceptable for Xpert Xpress SARS-CoV-2/FLU/RSV testing.  Fact Sheet for Patients: EntrepreneurPulse.com.au  Fact Sheet for Healthcare Providers: IncredibleEmployment.be  This test is not yet approved or cleared by the [place] FDA and has been authorized for detection and/or diagnosis of SARS-CoV-2 by FDA under an Emergency Use Authorization (EUA). This EUA will remain in effect (meaning this test can be used) for the duration of the COVID-19 declaration under Section 564(b)(1) of  the Act, 21 U.S.C. section 360bbb-3(b)(1), unless the authorization is terminated or revoked.  Performed at [facility], [address], [place], [place] [address]   Surgical PCR screen     Status: None   Collection Time: 05/06/21  7:30 PM   Specimen: Nasal Mucosa; Nasal Swab  Result Value Ref Range Status   MRSA, PCR NEGATIVE NEGATIVE Final   Staphylococcus aureus NEGATIVE NEGATIVE Final    Comment: (NOTE) The Xpert SA Assay (FDA approved for NASAL specimens in patients 27 years of age and older), is one component of a comprehensive surveillance program. It is not intended to diagnose infection nor to guide or monitor treatment. Performed at [facility], [address], [place], [place] [address]          Radiology Studies: No results found.      Scheduled Meds:  [MAR Hold] lisinopril  5 mg Oral Daily   [MAR Hold] mupirocin ointment  1 application Nasal BID   [MAR Hold] pantoprazole (PROTONIX) IV  40 mg Intravenous Q12H   psyllium  1 packet Oral QHS   [MAR Hold] simvastatin  40 mg Oral QPM   Continuous Infusions:  sodium chloride     lactated ringers       LOS: 0 days    Time spent: 35 minutes.     Elmarie Shiley, MD [facility]   If 7PM-7AM, please contact night-coverage [email or web address]  05/07/2021, 3:04 PM

## 2021-05-07 NOTE — Transfer of Care (Signed)
Immediate Anesthesia Transfer of Care Note  Patient: Jeremy [name]  Procedure(s) Performed: ESOPHAGOGASTRODUODENOSCOPY (EGD) WITH PROPOFOL ESOPHAGEAL DILATION COLONOSCOPY WITH PROPOFOL  Patient Location: PACU  Anesthesia Type:General  Level of Consciousness: awake  Airway & Oxygen Therapy: Patient Spontanous Breathing  Post-op Assessment: Report given to RN  Post vital signs: Reviewed and stable  Last Vitals:  Vitals Value Taken Time  BP 118/72 05/07/21 1347  Temp    Pulse 67 05/07/21 1351  Resp 20 05/07/21 1351  SpO2 97 % 05/07/21 1351  Vitals shown include unvalidated device data.  Last Pain:  Vitals:   05/07/21 1254  TempSrc:   PainSc: 0-No pain      Patients Stated Pain Goal: 4 [phone]  Complications: No notable events documented.

## 2021-05-07 NOTE — Op Note (Signed)
[facility] Patient Name: Jeremy [name] Procedure Date: 05/07/2021 12:27 PM MRN: [id number] Date of Birth: Aug 30, 1936 Attending MD: Hildred Laser , MD CSN: [id number] Age: 85 Admit Type: Inpatient Procedure:                Upper GI endoscopy Indications:              Esophageal dysphagia, Stricture of the esophagus,                            For therapy of esophageal stricture Providers:                Hildred Laser, MD, Lambert Mody, Janeece Riggers,                            RN Referring MD:             Elmarie Shiley, MD Medicines:                Propofol per Anesthesia Complications:            No immediate complications. Estimated Blood Loss:     Estimated blood loss was minimal. Procedure:                Pre-Anesthesia Assessment:                           - Prior to the procedure, a History and Physical                            was performed, and patient medications and                            allergies were reviewed. The patient's tolerance of                            previous anesthesia was also reviewed. The risks                            and benefits of the procedure and the sedation                            options and risks were discussed with the patient.                            All questions were answered, and informed consent                            was obtained. Prior Anticoagulants: The patient has                            taken no previous anticoagulant or antiplatelet                            agents except for aspirin. ASA Grade Assessment:  III - A patient with severe systemic disease. After                            reviewing the risks and benefits, the patient was                            deemed in satisfactory condition to undergo the                            procedure.                           After obtaining informed consent, the endoscope was                            passed under direct  vision. Throughout the                            procedure, the patient's blood pressure, pulse, and                            oxygen saturations were monitored continuously. The                            [id number] scope was introduced through the                            mouth, and advanced to the second part of duodenum.                            The upper GI endoscopy was accomplished without                            difficulty. The patient tolerated the procedure                            well. Scope In: 12:54:13 PM Scope Out: 1:01:39 PM Total Procedure Duration: 0 hours 7 minutes 26 seconds  Findings:      The hypopharynx was normal.      The examined esophagus was normal.      One benign-appearing, intrinsic moderate stenosis was found 39 cm from       the incisors. The stenosis was traversed. A TTS dilator was passed       through the scope. Dilation with a 15-16.5-18 mm balloon dilator was       performed to 15 mm, 16.5 mm and 18 mm. The dilation site was examined       and showed moderate mucosal disruption, moderate improvement in luminal       narrowing and no perforation.      A 2 cm hiatal hernia was present.      The entire examined stomach was normal.      The duodenal bulb and second portion of the duodenum were normal. Impression:               - Normal hypopharynx.                           -  Normal esophagus.                           - Benign-appearing esophageal stenosis. Dilated.                           - 2 cm hiatal hernia.                           - Normal stomach.                           - Normal duodenal bulb and second portion of the                            duodenum.                           - No specimens collected. Moderate Sedation:      Per Anesthesia Care Recommendation:           - Return patient to hospital ward for ongoing care.                           - Resume previous diet today.                           - Continue  present medications.                           - No aspirin, ibuprofen, naproxen, or other                            non-steroidal anti-inflammatory drugs for 3 days.                           - Repeat upper endoscopy PRN. Procedure Code(s):        --- Professional ---                           [phone], Esophagogastroduodenoscopy, flexible,                            transoral; with transendoscopic balloon dilation of                            esophagus (less than 30 mm diameter) Diagnosis Code(s):        --- Professional ---                           K22.2, Esophageal obstruction                           K44.9, Diaphragmatic hernia without obstruction or                            gangrene  R13.14, Dysphagia, pharyngoesophageal phase CPT copyright 2019 American Medical Association. All rights reserved. The codes documented in this report are preliminary and upon coder review may  be revised to meet current compliance requirements. Hildred Laser, MD Hildred Laser, MD 05/07/2021 2:10:49 PM This report has been signed electronically. Number of Addenda: 0

## 2021-05-07 NOTE — Progress Notes (Signed)
Brief EGD and colonoscopy note.   Moderate distal esophageal stricture dilated with balloon dilated to 18 mm. Small sliding hiatal hernia. Normal examination stomach first and second part of duodenum.   Colonoscopy completed with ultraslim scope. Numerous diverticula at sigmoid colon with noncritical stricture at 30 cm from the anal margin traversed with ultraslim scope. No polyps or other abnormalities noted. Anal papillae.

## 2021-05-07 NOTE — Evaluation (Signed)
Physical Therapy Evaluation Patient Details Name: Jeremy [name] MRN: [id number] DOB: [birth year] Today's Date: 05/07/2021   History of Present Illness  Jeremy [name]  is a 85 y.o. male, with history of coronary artery disease, COPD, diverticular of the colon, hyperlipidemia, hypertension, myocardial infarction, tobacco abuse, presents to ED with a chief complaint of rectal bleeding.  Patient reports that it started 1 to 2 days ago.  He reports that he has had gushes of blood without his bowel movements.  The bowel movements have been getting more frequent.  Last night he had 3-4 bloody bowel movements since 2 AM.  He reports that the blood appears as bright red.  He has no abdominal pain.  He did feel nauseous last evening, but he was able to have his dinner and his nightly milkshake without a problem.  He has not had any vomiting.  His last normal meal was dinner yesterday.  Lastly patient complains of some orthostatic symptoms.  Patient does report that he has similar incident a month ago.  He had a very constipated bowel movement which was nonbloody.  Then the next bowel movement he had was bloody.  Then it was completely resolved.  Patient reports that he does have fatigue in his legs, shortness of breath, but those seem to be related to post COVID symptoms since 2021.  Patient has no other complaints at this time.   Clinical Impression  Patient functioning at baseline for functional mobility and gait demonstrating good return for ambulation on level, inclined and declined surfaces without loss of balance.  Plan:  Patient discharged from physical therapy to care of nursing for ambulation daily as tolerated for length of stay.      Follow Up Recommendations No PT follow up    Equipment Recommendations  None recommended by PT    Recommendations for Other Services       Precautions / Restrictions Precautions Precautions: None Restrictions Weight Bearing Restrictions: No      Mobility   Bed Mobility Overal bed mobility: Modified Independent                  Transfers Overall transfer level: Modified independent                  Ambulation/Gait Ambulation/Gait assistance: Modified independent (Device/Increase time) Gait Distance (Feet): 200 Feet Assistive device: None Gait Pattern/deviations: WFL(Within Functional Limits) Gait velocity: slightly decreased   General Gait Details: demonstrates good return for ambulation on level, inclined and declined surfaces without loss of balance  Stairs            Wheelchair Mobility    Modified Rankin (Stroke Patients Only)       Balance Overall balance assessment: No apparent balance deficits (not formally assessed)                                           Pertinent Vitals/Pain Pain Assessment: No/denies pain    Home Living Family/patient expects to be discharged to:: Private residence Living Arrangements: Spouse/significant other Available Help at Discharge: Family;Available 24 hours/day Type of Home: House Home Access: Stairs to enter;Level entry Entrance Stairs-Rails: Right;Left;Can reach both Entrance Stairs-Number of Steps: level entry in front, 4 steps in back Home Layout: One level Home Equipment: Shower seat;Bedside commode;Cane - single point      Prior Function Level of Independence: Independent  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        Extremity/Trunk Assessment   Upper Extremity Assessment Upper Extremity Assessment: Overall WFL for tasks assessed    Lower Extremity Assessment Lower Extremity Assessment: Overall WFL for tasks assessed    Cervical / Trunk Assessment Cervical / Trunk Assessment: Normal  Communication   Communication: HOH  Cognition Arousal/Alertness: Awake/alert Behavior During Therapy: WFL for tasks assessed/performed Overall Cognitive Status: Within Functional Limits for tasks assessed                                         General Comments      Exercises     Assessment/Plan    PT Assessment Patent does not need any further PT services  PT Problem List         PT Treatment Interventions      PT Goals (Current goals can be found in the Care Plan section)  Acute Rehab PT Goals Patient Stated Goal: return home PT Goal Formulation: With patient/family Time For Goal Achievement: 05/07/21 Potential to Achieve Goals: Good    Frequency     Barriers to discharge        Co-evaluation               AM-PAC PT "6 Clicks" Mobility  Outcome Measure Help needed turning from your back to your side while in a flat bed without using bedrails?: None Help needed moving from lying on your back to sitting on the side of a flat bed without using bedrails?: None Help needed moving to and from a bed to a chair (including a wheelchair)?: None Help needed standing up from a chair using your arms (e.g., wheelchair or bedside chair)?: None Help needed to walk in hospital room?: None Help needed climbing 3-5 steps with a railing? : None 6 Click Score: 24    End of Session   Activity Tolerance: Patient tolerated treatment well Patient left: in bed;with call bell/phone within reach;with family/visitor present Nurse Communication: Mobility status PT Visit Diagnosis: Unsteadiness on feet (R26.81);Other abnormalities of gait and mobility (R26.89);Muscle weakness (generalized) (M62.81)    Time: [id number] PT Time Calculation (min) (ACUTE ONLY): 15 min   Charges:   PT Evaluation $PT Eval Low Complexity: 1 Low PT Treatments $Gait Training: 8-22 mins        11:30 AM, 05/07/21 Lonell Grandchild, MPT Physical Therapist with [facility] [phone] office [phone] mobile phone

## 2021-05-07 NOTE — Anesthesia Postprocedure Evaluation (Signed)
Anesthesia Post Note  Patient: Jeremy [name]  Procedure(s) Performed: ESOPHAGOGASTRODUODENOSCOPY (EGD) WITH PROPOFOL ESOPHAGEAL DILATION COLONOSCOPY WITH PROPOFOL  Patient location during evaluation: PACU Anesthesia Type: General Level of consciousness: awake and alert Pain management: pain level controlled Vital Signs Assessment: post-procedure vital signs reviewed and stable Respiratory status: nonlabored ventilation Cardiovascular status: blood pressure returned to baseline and stable Postop Assessment: no apparent nausea or vomiting Anesthetic complications: no   No notable events documented.   Last Vitals:  Vitals:   05/07/21 0738 05/07/21 1209  BP: 121/72 130/78  Pulse: 73 67  Resp:  (!) 24  Temp:  37.1 C  SpO2: 93% 92%    Last Pain:  Vitals:   05/07/21 1254  TempSrc:   PainSc: 0-No pain                 Travante Knee

## 2021-05-08 LAB — CBC
HCT: 31.8 % — ABNORMAL LOW (ref 39.0–52.0)
HCT: 34.2 % — ABNORMAL LOW (ref 39.0–52.0)
Hemoglobin: 10.7 g/dL — ABNORMAL LOW (ref 13.0–17.0)
Hemoglobin: 11.4 g/dL — ABNORMAL LOW (ref 13.0–17.0)
MCH: 30.5 pg (ref 26.0–34.0)
MCH: 30.8 pg (ref 26.0–34.0)
MCHC: 33.6 g/dL (ref 30.0–36.0)
MCHC: 33.3 g/dL (ref 30.0–36.0)
MCV: 90.6 fL (ref 80.0–100.0)
MCV: 92.4 fL (ref 80.0–100.0)
Platelets: 145 10*3/uL — ABNORMAL LOW (ref 150–400)
Platelets: 171 10*3/uL (ref 150–400)
RBC: 3.51 MIL/uL — ABNORMAL LOW (ref 4.22–5.81)
RBC: 3.7 MIL/uL — ABNORMAL LOW (ref 4.22–5.81)
RDW: 13.1 % (ref 11.5–15.5)
RDW: 13.2 % (ref 11.5–15.5)
WBC: 4.4 10*3/uL (ref 4.0–10.5)
WBC: 4.1 10*3/uL (ref 4.0–10.5)
nRBC: 0 % (ref 0.0–0.2)
nRBC: 0 % (ref 0.0–0.2)

## 2021-05-08 MED ORDER — ASPIRIN 81 MG PO TABS: 81.0000 mg | ORAL_TABLET | Freq: Every evening | ORAL | Status: DC

## 2021-05-08 MED ORDER — PANTOPRAZOLE SODIUM 40 MG PO TBEC: 40.0000 mg | DELAYED_RELEASE_TABLET | Freq: Two times a day (BID) | ORAL | 1 refills | Status: DC

## 2021-05-08 NOTE — Progress Notes (Addendum)
Subjective:  Patient has no complaints.  He states he was able to swallow much better this morning.  He denies chest pain shortness of breath or abdominal pain.  He is passing flatus but has not passed any more blood per rectum. His wife who is at bedside is concerned about aspirin interruption.  Current Medications:  Current Facility-Administered Medications:    acetaminophen (TYLENOL) tablet 650 mg, 650 mg, Oral, Q6H PRN, 650 mg at 05/07/21 2133 **OR** acetaminophen (TYLENOL) suppository 650 mg, 650 mg, Rectal, Q6H PRN, Zierle-Ghosh, Asia B, DO   albuterol (VENTOLIN HFA) 108 (90 Base) MCG/ACT inhaler 1-2 puff, 1-2 puff, Inhalation, Q6H PRN, Zierle-Ghosh, Asia B, DO   lisinopril (ZESTRIL) tablet 5 mg, 5 mg, Oral, Daily, Zierle-Ghosh, Asia B, DO, 5 mg at 05/08/21 6503   morphine 2 MG/ML injection 2 mg, 2 mg, Intravenous, Q2H PRN, Zierle-Ghosh, Asia B, DO   mupirocin ointment (BACTROBAN) 2 % 1 application, 1 application, Nasal, BID, Memon, Jolaine Artist, MD, 1 application at 54/65/68 0826   ondansetron (ZOFRAN) tablet 4 mg, 4 mg, Oral, Q6H PRN **OR** ondansetron (ZOFRAN) injection 4 mg, 4 mg, Intravenous, Q6H PRN, Zierle-Ghosh, Asia B, DO   oxyCODONE (Oxy IR/ROXICODONE) immediate release tablet 5 mg, 5 mg, Oral, Q4H PRN, Zierle-Ghosh, Asia B, DO   pantoprazole (PROTONIX) injection 40 mg, 40 mg, Intravenous, Q12H, Zierle-Ghosh, Asia B, DO, 40 mg at 05/08/21 0821   psyllium (HYDROCIL/METAMUCIL) 1 packet, 1 packet, Oral, QHS, Karia Ehresman U, MD, 1 packet at 05/07/21 2133   simvastatin (ZOCOR) tablet 40 mg, 40 mg, Oral, QPM, Zierle-Ghosh, Asia B, DO, 40 mg at 05/07/21 1825   Objective: Blood pressure 133/66, pulse 70, temperature 98.4 F (36.9 C), resp. rate (!) 21, height 6' (1.829 m), weight 80 kg, SpO2 94 %. Patient is alert and in no acute distress. Abdomen is symmetrical soft and nontender with organomegaly or masses. No LE edema.  Labs/studies Results:   CBC Latest Ref Rng & Units  05/08/2021 05/07/2021 05/07/2021  WBC 4.0 - 10.5 K/uL 4.4 - -  Hemoglobin 13.0 - 17.0 g/dL 10.7(L) 12.3(L) 11.8(L)  Hematocrit 39.0 - 52.0 % 31.8(L) 36.5(L) 35.3(L)  Platelets 150 - 400 K/uL 145(L) - -    CMP Latest Ref Rng & Units 05/07/2021 05/06/2021 04/19/2019  Glucose 70 - 99 mg/dL 96 110(H) 134(H)  BUN 8 - 23 mg/dL 18 21 28(H)  Creatinine 0.61 - 1.24 mg/dL 0.99 1.12 0.85  Sodium 135 - 145 mmol/L 139 140 137  Potassium 3.5 - 5.1 mmol/L 3.8 3.8 4.1  Chloride 98 - 111 mmol/L 105 107 103  CO2 22 - 32 mmol/L _0 Calcium 8.9 - 10.3 mg/dL 8.0(L) 8.3(L) 8.3(L)  Total Protein 6.5 - 8.1 g/dL 5.2(L) 6.2(L) 5.7(L)  Total Bilirubin 0.3 - 1.2 mg/dL 1.4(H) 0.9 0.4  Alkaline Phos 38 - 126 U/L 55 78 57  AST 15 - 41 U/L 13(L) 14(L) 16  ALT 0 - 44 U/L _1 Hepatic Function Latest Ref Rng & Units 05/07/2021 05/06/2021 04/19/2019  Total Protein 6.5 - 8.1 g/dL 5.2(L) 6.2(L) 5.7(L)  Albumin 3.5 - 5.0 g/dL 3.2(L) 3.7 3.0(L)  AST 15 - 41 U/L 13(L) 14(L) 16  ALT 0 - 44 U/L _2 Alk Phosphatase 38 - 126 U/L 55 78 57  Total Bilirubin 0.3 - 1.2 mg/dL 1.4(H) 0.9 0.4     Assessment:  #1.  Rectal bleeding felt to be due to colonic diverticulosis.  He had numerous  diverticula at sigmoid colon but none with stigmata of bleeding.  No other lesions identified.  Therefore reasonable to conclude that he had diverticular bleed. Drop in hemoglobin by 2 g on admission not unexpected.  He is hemodynamically stable. Discussed with Dr. Tyrell Antonio.  We will do H&H now instead of at 1:30 PM  #2.  Distal esophageal stricture.  The stricture was felt to be moderate and dilated to 18 mm with balloon.  He is now able to swallow without any difficulty.  #3.  Antiplatelet issues.  Can shorten interruption to 2 days rather than 3 days.  Recommendations  H&H now. Continue pantoprazole as before. Can resume aspirin on 05/09/2021. Patient advised to call office if dysphagia recurs.

## 2021-05-08 NOTE — Discharge Summary (Signed)
Physician Discharge Summary  Jeremy [name] [id number] DOB: 07-07-1936 DOA: 05/06/2021  PCP: Celene Squibb, MD  Admit date: 05/06/2021 Discharge date: 05/08/2021  Admitted From: Home  Disposition: Home   Recommendations for Outpatient Follow-up:  Follow up with PCP in 1-2 weeks Please obtain BMP/CBC in one week Resume aspirin tomorrow.    Discharge Condition: Stable.  CODE STATUS: Full code Diet recommendation: Heart Healthy   Brief/Interim Summary: 85 year old with past medical history significant for CAD status post MI, COPD, tobacco use, hypertension, high diverticulosis who presents to the ED with bright red blood per rectum for 2 days following a constipated hard stool.  This was associated with nausea and orthostatic lightheadedness prompting ED evaluation early the morning of admission.  He continued to have bright red bloody stool in the ED, though his hemoglobin is 14, he is symptomatic from blood loss and was admitted to the hospital for further evaluation.   1-Painless Hematochezia, Acute blood loss anemia, Acute Diverticular Bleed: -Hemoglobin has decreased to 11. -Underwent colonoscopy which showed numerous diverticula sigmoid colon with no critical stricture at 30 cm from the anal margin transferred with ultraslim scope.  No polyp or other abnormalities noted. -Endoscopy: Moderate distal esophageal stricture dilated with a balloon dilated to 18 mm.  A small sliding hiatal hernia. Cbc trend 10 this am, repeated this afternoon at 11. Plan to discharge today.    2-CAD status post stent, initially LAD 1991: Denies chest pain. Wife was concern about holding aspirin. Plan to resume aspirin tomorrow per GI.    Hypertension: Continue low-dose lisinopril. HLD;  Continue with statin COPD without exacerbation: Continue with duo nebs Tabacco Use; Declined nicotine patch counseling provided          Discharge Diagnoses:  Active Problems:   Hypertension   CAD (coronary  artery disease)   Tobacco abuse   Dysphagia   GI bleed    Discharge Instructions  Discharge Instructions     Diet - low sodium heart healthy   Complete by: As directed    Increase activity slowly   Complete by: As directed       Allergies as of 05/08/2021   No Known Allergies      Medication List     STOP taking these medications    meloxicam 15 MG tablet Commonly known as: MOBIC       TAKE these medications    acetaminophen 500 MG tablet Commonly known as: TYLENOL Take 500-1,000 mg by mouth daily as needed for moderate pain or headache.   albuterol 108 (90 Base) MCG/ACT inhaler Commonly known as: VENTOLIN HFA Inhale 1-2 puffs into the lungs every 6 (six) hours as needed for wheezing or shortness of breath.   aspirin 81 MG tablet Take 1 tablet (81 mg total) by mouth every evening. Start taking on: May 09, 2021   B-12 COMPLIANCE INJECTION IJ Inject 1 Dose as directed every 30 (thirty) days. What changed: Another medication with the same name was removed. Continue taking this medication, and follow the directions you see here.   diphenhydrAMINE 25 MG tablet Commonly known as: BENADRYL Take 12.5 mg by mouth at bedtime as needed for sleep.   ICY HOT EX Apply 1 application topically daily as needed (pain).   lisinopril 5 MG tablet Commonly known as: ZESTRIL TAKE 1 TABLET BY MOUTH DAILY   loratadine 10 MG tablet Commonly known as: CLARITIN Take 10 mg by mouth daily as needed for allergies.   nitroGLYCERIN 0.4 MG SL  tablet Commonly known as: NITROSTAT PLACE 1 TABLET UNDER THE TONGUE EVERY 5 MINUTES FOR 3 DOSES AS NEEDED FOR CHEST PAIN What changed: See the new instructions.   pantoprazole 40 MG tablet Commonly known as: PROTONIX Take 1 tablet (40 mg total) by mouth 2 (two) times daily. What changed: See the new instructions.   simvastatin 40 MG tablet Commonly known as: ZOCOR TAKE 1 TABLET(40 MG) BY MOUTH EVERY EVENING What changed: See the  new instructions.        No Known Allergies  Consultations: GI   Procedures/Studies: No results found.    Subjective: He is alert, denies abdominal pain. No further bloody stool.   Discharge Exam: Vitals:   05/07/21 2022 05/08/21 0329  BP: 121/68 133/66  Pulse: 81 70  Resp: 20 (!) 21  Temp: 98.5 F (36.9 C) 98.4 F (36.9 C)  SpO2: 95% 94%     General: Pt is alert, awake, not in acute distress Cardiovascular: RRR, S1/S2 +, no rubs, no gallops Respiratory: CTA bilaterally, no wheezing, no rhonchi Abdominal: Soft, NT, ND, bowel sounds + Extremities: no edema, no cyanosis    The results of significant diagnostics from this hospitalization (including imaging, microbiology, ancillary and laboratory) are listed below for reference.     Microbiology: Recent Results (from the past 240 hour(s))  Resp Panel by RT-PCR (Flu A&B, Covid) Nasopharyngeal Swab     Status: None   Collection Time: 05/06/21  5:25 AM   Specimen: Nasopharyngeal Swab; Nasopharyngeal(NP) swabs in vial transport medium  Result Value Ref Range Status   SARS Coronavirus 2 by RT PCR NEGATIVE NEGATIVE Final    Comment: (NOTE) SARS-CoV-2 target nucleic acids are NOT DETECTED.  The SARS-CoV-2 RNA is generally detectable in upper respiratory specimens during the acute phase of infection. The lowest concentration of SARS-CoV-2 viral copies this assay can detect is 138 copies/mL. A negative result does not preclude SARS-Cov-2 infection and should not be used as the sole basis for treatment or other patient management decisions. A negative result may occur with  improper specimen collection/handling, submission of specimen other than nasopharyngeal swab, presence of viral mutation(s) within the areas targeted by this assay, and inadequate number of viral copies(<138 copies/mL). A negative result must be combined with clinical observations, patient history, and epidemiological information. The expected  result is Negative.  Fact Sheet for Patients:  EntrepreneurPulse.com.au  Fact Sheet for Healthcare Providers:  IncredibleEmployment.be  This test is no t yet approved or cleared by the [place] FDA and  has been authorized for detection and/or diagnosis of SARS-CoV-2 by FDA under an Emergency Use Authorization (EUA). This EUA will remain  in effect (meaning this test can be used) for the duration of the COVID-19 declaration under Section 564(b)(1) of the Act, 21 U.S.C.section 360bbb-3(b)(1), unless the authorization is terminated  or revoked sooner.       Influenza A by PCR NEGATIVE NEGATIVE Final   Influenza B by PCR NEGATIVE NEGATIVE Final    Comment: (NOTE) The Xpert Xpress SARS-CoV-2/FLU/RSV plus assay is intended as an aid in the diagnosis of influenza from Nasopharyngeal swab specimens and should not be used as a sole basis for treatment. Nasal washings and aspirates are unacceptable for Xpert Xpress SARS-CoV-2/FLU/RSV testing.  Fact Sheet for Patients: EntrepreneurPulse.com.au  Fact Sheet for Healthcare Providers: IncredibleEmployment.be  This test is not yet approved or cleared by the [place] FDA and has been authorized for detection and/or diagnosis of SARS-CoV-2 by FDA under an Emergency Use Authorization (  EUA). This EUA will remain in effect (meaning this test can be used) for the duration of the COVID-19 declaration under Section 564(b)(1) of the Act, 21 U.S.C. section 360bbb-3(b)(1), unless the authorization is terminated or revoked.  Performed at [facility], [address], [place], [place] [address]   Surgical PCR screen     Status: None   Collection Time: 05/06/21  7:30 PM   Specimen: Nasal Mucosa; Nasal Swab  Result Value Ref Range Status   MRSA, PCR NEGATIVE NEGATIVE Final   Staphylococcus aureus NEGATIVE NEGATIVE Final    Comment: (NOTE) The Xpert SA Assay (FDA  approved for NASAL specimens in patients 69 years of age and older), is one component of a comprehensive surveillance program. It is not intended to diagnose infection nor to guide or monitor treatment. Performed at [facility], [address], [place], [place] [address]      Labs: BNP (last 3 results) No results for input(s): BNP in the last 8760 hours. Basic Metabolic Panel: Recent Labs  Lab 05/06/21 0400 05/07/21 0259  NA 140 139  K 3.8 3.8  CL 107 105  CO2 29 28  GLUCOSE 110* 96  BUN 21 18  CREATININE 1.12 0.99  CALCIUM 8.3* 8.0*  MG  --  1.9   Liver Function Tests: Recent Labs  Lab 05/06/21 0400 05/07/21 0259  AST 14* 13*  ALT 10 10  ALKPHOS 78 55  BILITOT 0.9 1.4*  PROT 6.2* 5.2*  ALBUMIN 3.7 3.2*   No results for input(s): LIPASE, AMYLASE in the last 168 hours. No results for input(s): AMMONIA in the last 168 hours. CBC: Recent Labs  Lab 05/06/21 0400 05/06/21 0947 05/06/21 1500 05/06/21 2056 05/07/21 0259 05/07/21 0900 05/08/21 0637 05/08/21 1222  WBC 5.4 5.7  --   --   --   --  4.4 4.1  HGB 14.5 12.8*   < > 13.7 11.8* 12.3* 10.7* 11.4*  HCT 43.2 38.9*   < > 41.0 35.3* 36.5* 31.8* 34.2*  MCV 91.1 92.8  --   --   --   --  90.6 92.4  PLT 196 186  --   --   --   --  145* 171   < > = values in this interval not displayed.   Cardiac Enzymes: No results for input(s): CKTOTAL, CKMB, CKMBINDEX, TROPONINI in the last 168 hours. BNP: Invalid input(s): POCBNP CBG: No results for input(s): GLUCAP in the last 168 hours. D-Dimer No results for input(s): DDIMER in the last 72 hours. Hgb A1c No results for input(s): HGBA1C in the last 72 hours. Lipid Profile No results for input(s): CHOL, HDL, LDLCALC, TRIG, CHOLHDL, LDLDIRECT in the last 72 hours. Thyroid function studies No results for input(s): TSH, T4TOTAL, T3FREE, THYROIDAB in the last 72 hours.  Invalid input(s): FREET3 Anemia work up No results for input(s): VITAMINB12, FOLATE, FERRITIN, TIBC,  IRON, RETICCTPCT in the last 72 hours. Urinalysis    Component Value Date/Time   COLORURINE YELLOW 05/06/2021 0919   APPEARANCEUR CLEAR 05/06/2021 0919   LABSPEC 1.025 05/06/2021 0919   PHURINE 5.0 05/06/2021 0919   GLUCOSEU NEGATIVE 05/06/2021 0919   HGBUR NEGATIVE 05/06/2021 0919   BILIRUBINUR NEGATIVE 05/06/2021 0919   KETONESUR NEGATIVE 05/06/2021 0919   PROTEINUR NEGATIVE 05/06/2021 0919   UROBILINOGEN 0.2 10/22/2013 0820   NITRITE NEGATIVE 05/06/2021 0919   LEUKOCYTESUR NEGATIVE 05/06/2021 0919   Sepsis Labs Invalid input(s): PROCALCITONIN,  WBC,  LACTICIDVEN Microbiology Recent Results (from the past 240 hour(s))  Resp Panel by RT-PCR (Flu A&B, Covid) Nasopharyngeal Swab     Status: None   Collection Time: 05/06/21  5:25 AM   Specimen: Nasopharyngeal Swab; Nasopharyngeal(NP) swabs in vial transport medium  Result Value Ref Range Status   SARS Coronavirus 2 by RT PCR NEGATIVE NEGATIVE Final    Comment: (NOTE) SARS-CoV-2 target nucleic acids are NOT DETECTED.  The SARS-CoV-2 RNA is generally detectable in upper respiratory specimens during the acute phase of infection. The lowest concentration of SARS-CoV-2 viral copies this assay can detect is 138 copies/mL. A negative result does not preclude SARS-Cov-2 infection and should not be used as the sole basis for treatment or other patient management decisions. A negative result may occur with  improper specimen collection/handling, submission of specimen other than nasopharyngeal swab, presence of viral mutation(s) within the areas targeted by this assay, and inadequate number of viral copies(<138 copies/mL). A negative result must be combined with clinical observations, patient history, and epidemiological information. The expected result is Negative.  Fact Sheet for Patients:  EntrepreneurPulse.com.au  Fact Sheet for Healthcare Providers:  IncredibleEmployment.be  This test is no  t yet approved or cleared by the [place] FDA and  has been authorized for detection and/or diagnosis of SARS-CoV-2 by FDA under an Emergency Use Authorization (EUA). This EUA will remain  in effect (meaning this test can be used) for the duration of the COVID-19 declaration under Section 564(b)(1) of the Act, 21 U.S.C.section 360bbb-3(b)(1), unless the authorization is terminated  or revoked sooner.       Influenza A by PCR NEGATIVE NEGATIVE Final   Influenza B by PCR NEGATIVE NEGATIVE Final    Comment: (NOTE) The Xpert Xpress SARS-CoV-2/FLU/RSV plus assay is intended as an aid in the diagnosis of influenza from Nasopharyngeal swab specimens and should not be used as a sole basis for treatment. Nasal washings and aspirates are unacceptable for Xpert Xpress SARS-CoV-2/FLU/RSV testing.  Fact Sheet for Patients: EntrepreneurPulse.com.au  Fact Sheet for Healthcare Providers: IncredibleEmployment.be  This test is not yet approved or cleared by the [place] FDA and has been authorized for detection and/or diagnosis of SARS-CoV-2 by FDA under an Emergency Use Authorization (EUA). This EUA will remain in effect (meaning this test can be used) for the duration of the COVID-19 declaration under Section 564(b)(1) of the Act, 21 U.S.C. section 360bbb-3(b)(1), unless the authorization is terminated or revoked.  Performed at [facility], [address], [place], [place] [address]   Surgical PCR screen     Status: None   Collection Time: 05/06/21  7:30 PM   Specimen: Nasal Mucosa; Nasal Swab  Result Value Ref Range Status   MRSA, PCR NEGATIVE NEGATIVE Final   Staphylococcus aureus NEGATIVE NEGATIVE Final    Comment: (NOTE) The Xpert SA Assay (FDA approved for NASAL specimens in patients 37 years of age and older), is one component of a comprehensive surveillance program. It is not intended to diagnose infection nor to guide or monitor  treatment. Performed at [facility], [address], [place], [place] [address]      Time coordinating discharge: 40 minutes  SIGNED:   Elmarie Shiley, MD  [facility]

## 2021-05-08 NOTE — Progress Notes (Signed)
Nsg Discharge Note  Admit Date:  05/06/2021 Discharge date: 05/08/2021   Jeremy [name] to be D/C'd Home per MD order.  AVS completed.  Copy for chart, and copy for patient signed, and dated. Patient/caregiver able to verbalize understanding.  Discharge Medication: Allergies as of 05/08/2021   No Known Allergies      Medication List     STOP taking these medications    meloxicam 15 MG tablet Commonly known as: MOBIC       TAKE these medications    acetaminophen 500 MG tablet Commonly known as: TYLENOL Take 500-1,000 mg by mouth daily as needed for moderate pain or headache.   albuterol 108 (90 Base) MCG/ACT inhaler Commonly known as: VENTOLIN HFA Inhale 1-2 puffs into the lungs every 6 (six) hours as needed for wheezing or shortness of breath.   aspirin 81 MG tablet Take 1 tablet (81 mg total) by mouth every evening. Start taking on: May 09, 2021   B-12 COMPLIANCE INJECTION IJ Inject 1 Dose as directed every 30 (thirty) days. What changed: Another medication with the same name was removed. Continue taking this medication, and follow the directions you see here.   diphenhydrAMINE 25 MG tablet Commonly known as: BENADRYL Take 12.5 mg by mouth at bedtime as needed for sleep.   ICY HOT EX Apply 1 application topically daily as needed (pain).   lisinopril 5 MG tablet Commonly known as: ZESTRIL TAKE 1 TABLET BY MOUTH DAILY   loratadine 10 MG tablet Commonly known as: CLARITIN Take 10 mg by mouth daily as needed for allergies.   nitroGLYCERIN 0.4 MG SL tablet Commonly known as: NITROSTAT PLACE 1 TABLET UNDER THE TONGUE EVERY 5 MINUTES FOR 3 DOSES AS NEEDED FOR CHEST PAIN What changed: See the new instructions.   pantoprazole 40 MG tablet Commonly known as: PROTONIX Take 1 tablet (40 mg total) by mouth 2 (two) times daily. What changed: See the new instructions.   simvastatin 40 MG tablet Commonly known as: ZOCOR TAKE 1 TABLET(40 MG) BY MOUTH EVERY  EVENING What changed: See the new instructions.        Discharge Assessment: Vitals:   05/07/21 2022 05/08/21 0329  BP: 121/68 133/66  Pulse: 81 70  Resp: 20 (!) 21  Temp: 98.5 F (36.9 C) 98.4 F (36.9 C)  SpO2: 95% 94%   Skin clean, dry and intact without evidence of skin break down, no evidence of skin tears noted. IV catheter discontinued intact. Site without signs and symptoms of complications - no redness or edema noted at insertion site, patient denies c/o pain - only slight tenderness at site.  Dressing with slight pressure applied.  D/c Instructions-Education: Discharge instructions given to patient/family with verbalized understanding. D/c education completed with patient/family including follow up instructions, medication list, d/c activities limitations if indicated, with other d/c instructions as indicated by MD - patient able to verbalize understanding, all questions fully answered. Patient instructed to return to ED, call 911, or call MD for any changes in condition.  Patient escorted via [place], and D/C home via private auto.  Clovis Fredrickson, LPN [id number] PM

## 2021-05-09 ENCOUNTER — Encounter (HOSPITAL_COMMUNITY): Payer: Self-pay | Admitting: Internal Medicine

## 2021-06-13 DIAGNOSIS — E1165 Type 2 diabetes mellitus with hyperglycemia: Secondary | ICD-10-CM | POA: Diagnosis not present

## 2021-06-13 DIAGNOSIS — I10 Essential (primary) hypertension: Secondary | ICD-10-CM | POA: Diagnosis not present

## 2021-07-07 ENCOUNTER — Ambulatory Visit
Admission: EM | Admit: 2021-07-07 | Discharge: 2021-07-07 | Disposition: A | Discharge status: Home / Self Care | Payer: Medicare Other | Attending: Urgent Care | Admitting: Urgent Care

## 2021-07-07 ENCOUNTER — Ambulatory Visit (INDEPENDENT_AMBULATORY_CARE_PROVIDER_SITE_OTHER): Payer: Medicare Other

## 2021-07-07 ENCOUNTER — Encounter: Payer: Self-pay | Admitting: Emergency Medicine

## 2021-07-07 ENCOUNTER — Other Ambulatory Visit: Payer: Self-pay

## 2021-07-07 DIAGNOSIS — R059 Cough, unspecified: Secondary | ICD-10-CM | POA: Diagnosis not present

## 2021-07-07 DIAGNOSIS — R0602 Shortness of breath: Secondary | ICD-10-CM | POA: Diagnosis not present

## 2021-07-07 DIAGNOSIS — B9789 Other viral agents as the cause of diseases classified elsewhere: Secondary | ICD-10-CM | POA: Diagnosis not present

## 2021-07-07 DIAGNOSIS — R062 Wheezing: Secondary | ICD-10-CM

## 2021-07-07 DIAGNOSIS — J988 Other specified respiratory disorders: Secondary | ICD-10-CM

## 2021-07-07 DIAGNOSIS — J449 Chronic obstructive pulmonary disease, unspecified: Secondary | ICD-10-CM

## 2021-07-07 MED ORDER — PREDNISONE 20 MG PO TABS: ORAL_TABLET | ORAL | 0 refills | Status: DC

## 2021-07-07 NOTE — ED Triage Notes (Signed)
Pt is present today with SOB and cough. Pt sx started two weeks ago  Pt states that he is having a hard time breathing when he walks or talks.

## 2021-07-07 NOTE — ED Provider Notes (Signed)
[facility]   MRN: [id number] DOB: March 17, 1936  Subjective:   Jeremy [name] is a 85 y.o. male presenting for acute onset 2-week history of coughing, shortness of breath and wheezing.  Patient has a history of COPD.  Is having slight shortness of breath when he walks and when he starts talking.  He is using his COPD inhalers.  No active chest pain.  No history of heart failure.  Has a history of COVID-19 several months ago.  No current facility-administered medications for this encounter.  Current Outpatient Medications:    acetaminophen (TYLENOL) 500 MG tablet, Take 500-1,000 mg by mouth daily as needed for moderate pain or headache., Disp: , Rfl:    albuterol (PROVENTIL HFA;VENTOLIN HFA) 108 (90 Base) MCG/ACT inhaler, Inhale 1-2 puffs into the lungs every 6 (six) hours as needed for wheezing or shortness of breath., Disp: , Rfl:    aspirin 81 MG tablet, Take 1 tablet (81 mg total) by mouth every evening., Disp: 30 tablet, Rfl:    Cyanocobalamin (B-12 COMPLIANCE INJECTION IJ), Inject 1 Dose as directed every 30 (thirty) days. (Patient not taking: Reported on 05/06/2021), Disp: , Rfl:    diphenhydrAMINE (BENADRYL) 25 MG tablet, Take 12.5 mg by mouth at bedtime as needed for sleep., Disp: , Rfl:    lisinopril (ZESTRIL) 5 MG tablet, TAKE 1 TABLET BY MOUTH DAILY (Patient taking differently: Take 5 mg by mouth daily.), Disp: 90 tablet, Rfl: 3   loratadine (CLARITIN) 10 MG tablet, Take 10 mg by mouth daily as needed for allergies., Disp: , Rfl:    Menthol, Topical Analgesic, (ICY HOT EX), Apply 1 application topically daily as needed (pain)., Disp: , Rfl:    nitroGLYCERIN (NITROSTAT) 0.4 MG SL tablet, PLACE 1 TABLET UNDER THE TONGUE EVERY 5 MINUTES FOR 3 DOSES AS NEEDED FOR CHEST PAIN (Patient taking differently: Place 0.4 mg under the tongue every 5 (five) minutes as needed for chest pain.), Disp: 75 tablet, Rfl: 3   pantoprazole (PROTONIX) 40 MG tablet, Take 1 tablet (40 mg total)  by mouth 2 (two) times daily., Disp: 90 tablet, Rfl: 1   simvastatin (ZOCOR) 40 MG tablet, TAKE 1 TABLET(40 MG) BY MOUTH EVERY EVENING (Patient taking differently: Take 40 mg by mouth every evening.), Disp: 90 tablet, Rfl: 3   No Known Allergies  Past Medical History:  Diagnosis Date   Abnormal CXR 4/20011   Chest  CT mode/serere COPD ( no mediastinal abnormality)   CAD (coronary artery disease) 2005   anterior MI with stent to the LAD in 1991 / stent 1996 / nuclear 2005, no ischemia   Carotid bruit    Doppler, June, 2013, 0-39% bilateral   COPD (chronic obstructive pulmonary disease) ([place])    Diverticula of colon    Ejection fraction    55-60%, echo, April, 2011, could not estimate RV pressure   Esophagitis    HOH (hard of hearing)    Hyperlipidemia    Hypertension    Myocardial infarction ([place])    x2   Shortness of breath    with exertion   Sleep apnea    Stop Bang score of 5   Tobacco abuse      Past Surgical History:  Procedure Laterality Date   APPENDECTOMY     BIOPSY  11/17/2017   Procedure: BIOPSY;  Surgeon: Rogene Houston, MD;  Location: [facility];  Service: Endoscopy;;  gastric   CARDIAC CATHETERIZATION     CATARACT EXTRACTION Left  CATARACT EXTRACTION W/PHACO Right 01/22/2014   Procedure: CATARACT EXTRACTION PHACO AND INTRAOCULAR LENS PLACEMENT (IOC);  Surgeon: Tonny Branch, MD;  Location: [facility];  Service: Ophthalmology;  Laterality: Right;  CDE 7.46   CHOLECYSTECTOMY     8 years ago [facility]   COLONOSCOPY  10/05/2012   Dr. Laural Golden: multiple diverticula at sigmoid colon, multiple polyps (tubular adenomas), small external hemorrhoids   COLONOSCOPY N/A 11/18/2017   Procedure: COLONOSCOPY;  Surgeon: Rogene Houston, MD;  [facility]: [facility];  Service: Endoscopy;  Laterality: N/A;   COLONOSCOPY WITH PROPOFOL N/A 05/07/2021   Procedure: COLONOSCOPY WITH PROPOFOL;  Surgeon: Rogene Houston, MD;  [facility];  Service: Endoscopy;  Laterality: N/A;   CYST  REMOVAL HAND     right wrist   ESOPHAGEAL DILATION N/A 05/23/2015   Procedure: ESOPHAGEAL DILATION;  Surgeon: Daneil Dolin, MD;  [facility]: [facility];  Service: Endoscopy;  Laterality: N/A;   ESOPHAGEAL DILATION N/A 11/17/2017   Procedure: ESOPHAGEAL DILATION;  Surgeon: Rogene Houston, MD;  [facility]: [facility];  Service: Endoscopy;  Laterality: N/A;   ESOPHAGEAL DILATION N/A 05/07/2021   Procedure: ESOPHAGEAL DILATION;  Surgeon: Rogene Houston, MD;  [facility];  Service: Endoscopy;  Laterality: N/A;   ESOPHAGOGASTRODUODENOSCOPY N/A 05/23/2015   Dr. Gala Romney: Ulcerative/ erosive reflux esophagitis with peptic stricture formation. Status post dilation as described. Hiatal hernia. Abnormal gastric mucosa of uncertain significance status post gastric biospy. +H.pylori   ESOPHAGOGASTRODUODENOSCOPY N/A 11/17/2017   Procedure: ESOPHAGOGASTRODUODENOSCOPY (EGD);  Surgeon: Rogene Houston, MD;  [facility];  Service: Endoscopy;  Laterality: N/A;   ESOPHAGOGASTRODUODENOSCOPY (EGD) WITH PROPOFOL N/A 05/07/2021   Procedure: ESOPHAGOGASTRODUODENOSCOPY (EGD) WITH PROPOFOL;  Surgeon: Rogene Houston, MD;  [facility];  Service: Endoscopy;  Laterality: N/A;   PERCUTANEOUS STENT INTERVENTION     SKIN CANCER EXCISION  2015   ear & neck   TONSILLECTOMY      Family History  Problem Relation Age of Onset   Heart disease Mother    Heart disease Father    Heart disease Brother    Stomach cancer Brother    Colon cancer Neg Hx     Social History   Tobacco Use   Smoking status: Former    Packs/day: 3.00    Years: 12.00    Pack years: 36.00    Types: Cigarettes    Start date: 09/15/1983    Quit date: 09/14/1994    Years since quitting: 26.8   Smokeless tobacco: Current    Types: Chew   Tobacco comments:    chews 80% of a pack tobacco per day -- 10/12 doesnt chew too much  Vaping Use   Vaping Use: Never used  Substance Use Topics   Alcohol use: No     Alcohol/week: 0.0 standard drinks   Drug use: No    ROS   Objective:   Vitals: BP (!) 145/82   Pulse 83   Temp 98.8 F (37.1 C)   Resp 19   SpO2 95%   Physical Exam Constitutional:      General: He is not in acute distress.    Appearance: Normal appearance. He is well-developed. He is not ill-appearing, toxic-appearing or diaphoretic.  HENT:     Head: Normocephalic and atraumatic.     Right Ear: External ear normal.     Left Ear: External ear normal.     Nose: Nose normal.     Mouth/Throat:  Mouth: Mucous membranes are moist.     Pharynx: Oropharynx is clear.  Eyes:     General: No scleral icterus.    Extraocular Movements: Extraocular movements intact.     Pupils: Pupils are equal, round, and reactive to light.  Cardiovascular:     Rate and Rhythm: Normal rate and regular rhythm.     Heart sounds: Normal heart sounds. No murmur heard.   No friction rub. No gallop.  Pulmonary:     Effort: Pulmonary effort is normal. No respiratory distress.     Breath sounds: No stridor. Examination of the right-upper field reveals wheezing. Examination of the left-upper field reveals wheezing. Examination of the right-middle field reveals wheezing. Examination of the left-middle field reveals wheezing. Examination of the right-lower field reveals wheezing. Examination of the left-lower field reveals wheezing. Wheezing present. No rhonchi or rales.  Skin:    General: Skin is warm and dry.  Neurological:     Mental Status: He is alert and oriented to person, place, and time.  Psychiatric:        Mood and Affect: Mood normal.        Behavior: Behavior normal.        Thought Content: Thought content normal.    DG Chest 2 View  Result Date: 07/07/2021 CLINICAL DATA:  cough, shob EXAM: CHEST - 2 VIEW COMPARISON:  04/19/2019. FINDINGS: The heart size and mediastinal contours are within normal limits. Chronic mild interstitial prominence without consolidation. No visible pleural  effusions or pneumothorax. Biapical pleuroparenchymal scarring. Thoracic spine degenerative change. IMPRESSION: Chronic mild interstitial prominence without consolidation. Electronically Signed   By: Margaretha Sheffield M.D.   On: 07/07/2021 13:55     Assessment and Plan :   PDMP not reviewed this encounter.  1. Viral respiratory illness   2. Shortness of breath   3. Wheezing   4. Chronic obstructive pulmonary disease, unspecified COPD type ([place])     In light of his COPD, wheezing will [place] an oral prednisone course. In addition, will manage for viral illness such as viral URI, viral syndrome, viral rhinitis, COVID-19. Counseled patient on nature of COVID-19 including modes of transmission, diagnostic testing, management and supportive care.  Offered scripts for symptomatic relief. COVID 19 testing is pending. Counseled patient on potential for adverse effects with medications prescribed/recommended today, ER and return-to-clinic precautions discussed, patient verbalized understanding.     Jaynee Eagles, PA-C 07/07/21 1404

## 2021-07-07 NOTE — Discharge Instructions (Addendum)
We will notify you of your COVID-19 test results as they arrive and may take between 48-72 hours.  I encourage you to sign up for MyChart if you have not already done so as this can be the easiest way for [place] to communicate results to you online or through a phone app.  Generally, we only contact you if it is a positive COVID result.  In the meantime, if you develop worsening symptoms including fever, chest pain, shortness of breath despite our current treatment plan then please report to the emergency room as this may be a sign of worsening status from possible COVID-19 infection.  Otherwise, we will manage this as a viral syndrome. For sore throat or cough try using a honey-based tea. Use 3 teaspoons of honey with juice squeezed from half lemon. Place shaved pieces of ginger into 1/2-1 cup of water and warm over stove top. Then mix the ingredients and repeat every 4 hours as needed. Please take Tylenol 500mg -650mg  every 6 hours for aches and pains, fevers. Hydrate very well with at least 2 liters of water. Eat light meals such as soups to replenish electrolytes and soft fruits, veggies. Start an antihistamine like Zyrtec for postnasal drainage, sinus congestion.

## 2021-07-07 NOTE — ED Notes (Signed)
Pt present today with SOB and hx of COPD. Pt O2 checked in the car O2 96%. Pt stable as of now and okay to wait in the car until room is available.

## 2021-07-08 LAB — NOVEL CORONAVIRUS, NAA: SARS-CoV-2, NAA: NOT DETECTED

## 2021-07-08 LAB — SARS-COV-2, NAA 2 DAY TAT

## 2021-07-26 DIAGNOSIS — J309 Allergic rhinitis, unspecified: Secondary | ICD-10-CM | POA: Diagnosis not present

## 2021-07-26 DIAGNOSIS — J449 Chronic obstructive pulmonary disease, unspecified: Secondary | ICD-10-CM | POA: Diagnosis not present

## 2021-07-31 ENCOUNTER — Ambulatory Visit
Admission: EM | Admit: 2021-07-31 | Discharge: 2021-07-31 | Disposition: A | Discharge status: Home / Self Care | Payer: Medicare Other | Attending: Emergency Medicine | Admitting: Emergency Medicine

## 2021-07-31 ENCOUNTER — Other Ambulatory Visit: Payer: Self-pay

## 2021-07-31 ENCOUNTER — Encounter: Payer: Self-pay | Admitting: Emergency Medicine

## 2021-07-31 ENCOUNTER — Encounter (HOSPITAL_COMMUNITY): Payer: Self-pay

## 2021-07-31 ENCOUNTER — Emergency Department (HOSPITAL_COMMUNITY): Payer: Medicare Other

## 2021-07-31 ENCOUNTER — Emergency Department (HOSPITAL_COMMUNITY)
Admission: EM | Admit: 2021-07-31 | Discharge: 2021-07-31 | Disposition: A | Discharge status: Home / Self Care | Payer: Medicare Other | Attending: Emergency Medicine | Admitting: Emergency Medicine

## 2021-07-31 DIAGNOSIS — R0789 Other chest pain: Secondary | ICD-10-CM

## 2021-07-31 DIAGNOSIS — Z8616 Personal history of COVID-19: Secondary | ICD-10-CM | POA: Diagnosis not present

## 2021-07-31 DIAGNOSIS — I2510 Atherosclerotic heart disease of native coronary artery without angina pectoris: Secondary | ICD-10-CM | POA: Insufficient documentation

## 2021-07-31 DIAGNOSIS — J441 Chronic obstructive pulmonary disease with (acute) exacerbation: Secondary | ICD-10-CM | POA: Diagnosis not present

## 2021-07-31 DIAGNOSIS — I10 Essential (primary) hypertension: Secondary | ICD-10-CM | POA: Insufficient documentation

## 2021-07-31 DIAGNOSIS — Z79899 Other long term (current) drug therapy: Secondary | ICD-10-CM | POA: Diagnosis not present

## 2021-07-31 DIAGNOSIS — R079 Chest pain, unspecified: Secondary | ICD-10-CM

## 2021-07-31 DIAGNOSIS — R062 Wheezing: Secondary | ICD-10-CM | POA: Diagnosis not present

## 2021-07-31 DIAGNOSIS — Z87891 Personal history of nicotine dependence: Secondary | ICD-10-CM | POA: Diagnosis not present

## 2021-07-31 DIAGNOSIS — J449 Chronic obstructive pulmonary disease, unspecified: Secondary | ICD-10-CM | POA: Diagnosis not present

## 2021-07-31 DIAGNOSIS — Z7982 Long term (current) use of aspirin: Secondary | ICD-10-CM | POA: Insufficient documentation

## 2021-07-31 DIAGNOSIS — U071 COVID-19: Secondary | ICD-10-CM | POA: Diagnosis not present

## 2021-07-31 LAB — CBC WITH DIFFERENTIAL/PLATELET
Abs Immature Granulocytes: 0.14 10*3/uL — ABNORMAL HIGH (ref 0.00–0.07)
Basophils Absolute: 0 10*3/uL (ref 0.0–0.1)
Basophils Relative: 0 %
Eosinophils Absolute: 0 10*3/uL (ref 0.0–0.5)
Eosinophils Relative: 0 %
HCT: 43.6 % (ref 39.0–52.0)
Hemoglobin: 13.6 g/dL (ref 13.0–17.0)
Immature Granulocytes: 2 %
Lymphocytes Relative: 8 %
Lymphs Abs: 0.8 10*3/uL (ref 0.7–4.0)
MCH: 26.9 pg (ref 26.0–34.0)
MCHC: 31.2 g/dL (ref 30.0–36.0)
MCV: 86.3 fL (ref 80.0–100.0)
Monocytes Absolute: 0.6 10*3/uL (ref 0.1–1.0)
Monocytes Relative: 6 %
Neutro Abs: 8.1 10*3/uL — ABNORMAL HIGH (ref 1.7–7.7)
Neutrophils Relative %: 84 %
Platelets: 265 10*3/uL (ref 150–400)
RBC: 5.05 MIL/uL (ref 4.22–5.81)
RDW: 13.5 % (ref 11.5–15.5)
WBC: 9.6 10*3/uL (ref 4.0–10.5)
nRBC: 0 % (ref 0.0–0.2)

## 2021-07-31 LAB — COMPREHENSIVE METABOLIC PANEL
ALT: 17 U/L (ref 0–44)
AST: 17 U/L (ref 15–41)
Albumin: 3.7 g/dL (ref 3.5–5.0)
Alkaline Phosphatase: 83 U/L (ref 38–126)
Anion gap: 6 (ref 5–15)
BUN: 23 mg/dL (ref 8–23)
CO2: 29 mmol/L (ref 22–32)
Calcium: 8.7 mg/dL — ABNORMAL LOW (ref 8.9–10.3)
Chloride: 106 mmol/L (ref 98–111)
Creatinine, Ser: 1.06 mg/dL (ref 0.61–1.24)
GFR, Estimated: >60 mL/min (ref >60)
Glucose, Bld: 103 mg/dL — ABNORMAL HIGH (ref 70–99)
Potassium: 4.3 mmol/L (ref 3.5–5.1)
Sodium: 141 mmol/L (ref 135–145)
Total Bilirubin: 0.8 mg/dL (ref 0.3–1.2)
Total Protein: 6.5 g/dL (ref 6.5–8.1)

## 2021-07-31 LAB — TROPONIN I (HIGH SENSITIVITY)
Troponin I (High Sensitivity): 4 ng/L (ref <18)
Troponin I (High Sensitivity): 4 ng/L (ref <18)

## 2021-07-31 MED ORDER — ASPIRIN 81 MG PO CHEW
324.0000 mg | CHEWABLE_TABLET | Freq: Once | ORAL | Status: AC
  Administered 2021-07-31: 10:00:00 324 mg via ORAL

## 2021-07-31 MED ORDER — IPRATROPIUM-ALBUTEROL 0.5-2.5 (3) MG/3ML IN SOLN
3.0000 mL | Freq: Once | RESPIRATORY_TRACT | Status: AC
  Administered 2021-07-31: 14:00:00 3 mL via RESPIRATORY_TRACT
  Filled 2021-07-31: qty 3

## 2021-07-31 MED ORDER — ALBUTEROL SULFATE (2.5 MG/3ML) 0.083% IN NEBU
INHALATION_SOLUTION | RESPIRATORY_TRACT | Status: AC
  Filled 2021-07-31: qty 3

## 2021-07-31 NOTE — ED Provider Notes (Signed)
[facility] EMERGENCY DEPARTMENT Provider Note   CSN: [id number] Arrival date & time: 07/31/21  1009     History Chief Complaint  Patient presents with   Chest Pain    Jeremy [name] is a 85 y.o. male.  Patient with onset of intermittent chest pain at 530 this morning that lasted till 730.  Patient followed by Dr. Harl Bowie.  Has had coronary artery disease with stents but the last time was in the 1990s.  So nothing recently.  Last saw Dr. Harl Bowie in March.  No chest pain now.  Went to urgent care referred in here.  Patient is also been struggling with what they thought was a COPD low energy shortness of breath with walking.  Oxygen saturations here 97%.  Little tachypneic with a respiratory rate around 24.  That has been ongoing for about a month.  Nothing new there.  Patient COPD and shortness of breath was improved a little bit here today.  And is on prednisone.  And is using albuterol inhaler.  Does not use oxygen at home.      Past Medical History:  Diagnosis Date   Abnormal CXR 4/20011   Chest  CT mode/serere COPD ( no mediastinal abnormality)   CAD (coronary artery disease) 2005   anterior MI with stent to the LAD in 1991 / stent 1996 / nuclear 2005, no ischemia   Carotid bruit    Doppler, June, 2013, 0-39% bilateral   COPD (chronic obstructive pulmonary disease) ([place])    Diverticula of colon    Ejection fraction    55-60%, echo, April, 2011, could not estimate RV pressure   Esophagitis    HOH (hard of hearing)    Hyperlipidemia    Hypertension    Myocardial infarction ([place])    x2   Shortness of breath    with exertion   Sleep apnea    Stop Bang score of 5   Tobacco abuse     Patient Active Problem List   Diagnosis Date Noted   COVID-19 virus infection 04/18/2019   COVID-19 04/17/2019   Special screening for malignant neoplasms, colon 09/21/2018   Diverticulosis of large intestine with hemorrhage    GI bleed due to NSAIDs 11/17/2017   Rectal bleed 11/16/2017    GI bleed 11/16/2017   Hypokalemia 11/16/2017   Chest pain 05/02/2016   H. pylori infection 08/15/2015   Mucosal abnormality of stomach    Reflux esophagitis    Peptic stricture of esophagus    Rectal bleeding 05/21/2015   Dyspepsia 05/21/2015   Dysphagia 05/21/2015   Sinus bradycardia 03/19/2014   Carotid artery disease ([place]) 03/15/2014   Hypertension    Hyperlipidemia    CAD (coronary artery disease)    Abnormal CXR    Ejection fraction    Tobacco abuse    COPD (chronic obstructive pulmonary disease) ([place])     Past Surgical History:  Procedure Laterality Date   APPENDECTOMY     BIOPSY  11/17/2017   Procedure: BIOPSY;  Surgeon: Rogene Houston, MD;  Location[facility];  Service: Endoscopy;;  gastric   CARDIAC CATHETERIZATION     CATARACT EXTRACTION Left    CATARACT EXTRACTION W/PHACO Right 01/22/2014   Procedure: CATARACT EXTRACTION PHACO AND INTRAOCULAR LENS PLACEMENT ([place]);  Surgeon: Tonny Branch, MD;  Location: [facility] ORS;  Service: Ophthalmology;  Laterality: Right;  CDE 7.46   CHOLECYSTECTOMY     8 years ago [facility]   COLONOSCOPY  10/05/2012   Dr. Laural Golden:  multiple diverticula at sigmoid colon, multiple polyps (tubular adenomas), small external hemorrhoids   COLONOSCOPY N/A 11/18/2017   Procedure: COLONOSCOPY;  Surgeon: Rogene Houston, MD;  Location: [facility];  Service: Endoscopy;  Laterality: N/A;   COLONOSCOPY WITH PROPOFOL N/A 05/07/2021   Procedure: COLONOSCOPY WITH PROPOFOL;  Surgeon: Rogene Houston, MD;  Location[facility];  Service: Endoscopy;  Laterality: N/A;   CYST REMOVAL HAND     right wrist   ESOPHAGEAL DILATION N/A 05/23/2015   Procedure: ESOPHAGEAL DILATION;  Surgeon: Daneil Dolin, MD;  Location: [facility];  Service: Endoscopy;  Laterality: N/A;   ESOPHAGEAL DILATION N/A 11/17/2017   Procedure: ESOPHAGEAL DILATION;  Surgeon: Rogene Houston, MD;  Location[facility];  Service: Endoscopy;  Laterality: N/A;   ESOPHAGEAL DILATION N/A 05/07/2021    Procedure: ESOPHAGEAL DILATION;  Surgeon: Rogene Houston, MD;  [facility];  Service: Endoscopy;  Laterality: N/A;   ESOPHAGOGASTRODUODENOSCOPY N/A 05/23/2015   Dr. Gala Romney: Ulcerative/ erosive reflux esophagitis with peptic stricture formation. Status post dilation as described. Hiatal hernia. Abnormal gastric mucosa of uncertain significance status post gastric biospy. +H.pylori   ESOPHAGOGASTRODUODENOSCOPY N/A 11/17/2017   Procedure: ESOPHAGOGASTRODUODENOSCOPY (EGD);  Surgeon: Rogene Houston, MD;  [facility];  Service: Endoscopy;  Laterality: N/A;   ESOPHAGOGASTRODUODENOSCOPY (EGD) WITH PROPOFOL N/A 05/07/2021   Procedure: ESOPHAGOGASTRODUODENOSCOPY (EGD) WITH PROPOFOL;  Surgeon: Rogene Houston, MD;  [facility];  Service: Endoscopy;  Laterality: N/A;   PERCUTANEOUS STENT INTERVENTION     SKIN CANCER EXCISION  2015   ear & neck   TONSILLECTOMY         Family History  Problem Relation Age of Onset   Heart disease Mother    Heart disease Father    Heart disease Brother    Stomach cancer Brother    Colon cancer Neg Hx     Social History   Tobacco Use   Smoking status: Former    Packs/day: 3.00    Years: 12.00    Pack years: 36.00    Types: Cigarettes    Start date: 09/15/1983    Quit date: 09/14/1994    Years since quitting: 26.8   Smokeless tobacco: Current    Types: Chew   Tobacco comments:    chews 80% of a pack tobacco per day -- 10/12 doesnt chew too much  Vaping Use   Vaping Use: Never used  Substance Use Topics   Alcohol use: No    Alcohol/week: 0.0 standard drinks   Drug use: No    Home Medications Prior to Admission medications   Medication Sig Start Date End Date Taking? Authorizing Provider  acetaminophen (TYLENOL) 500 MG tablet Take 500-1,000 mg by mouth daily as needed for moderate pain or headache.   Yes [provider]  albuterol (PROVENTIL HFA;VENTOLIN HFA) 108 (90 Base) MCG/ACT inhaler Inhale 1-2 puffs  into the lungs every 6 (six) hours as needed for wheezing or shortness of breath.   Yes [provider]  aspirin 81 MG tablet Take 1 tablet (81 mg total) by mouth every evening. 05/09/21  Yes Regalado, Belkys A, MD  BREZTRI AEROSPHERE 160-9-4.8 MCG/ACT AERO Inhale 2 puffs into the lungs 2 (two) times daily. 07/26/21  Yes [provider]  diphenhydrAMINE (BENADRYL) 25 MG tablet Take 12.5 mg by mouth at bedtime as needed for sleep.   Yes [provider]  levocetirizine (XYZAL) 5 MG tablet Take 5 mg by mouth  daily. 07/26/21  Yes [provider]  lisinopril (ZESTRIL) 5 MG tablet TAKE 1 TABLET BY MOUTH DAILY Patient taking differently: Take 5 mg by mouth daily. 12/09/20  Yes BranchAlphonse Guild, MD  Menthol, Topical Analgesic, (ICY HOT EX) Apply 1 application topically daily as needed (pain).   Yes [provider]  nitroGLYCERIN (NITROSTAT) 0.4 MG SL tablet PLACE 1 TABLET UNDER THE TONGUE EVERY 5 MINUTES FOR 3 DOSES AS NEEDED FOR CHEST PAIN Patient taking differently: Place 0.4 mg under the tongue every 5 (five) minutes as needed for chest pain. 08/11/16  Yes BranchAlphonse Guild, MD  pantoprazole (PROTONIX) 40 MG tablet Take 1 tablet (40 mg total) by mouth 2 (two) times daily. 05/08/21  Yes Regalado, Belkys A, MD  predniSONE (DELTASONE) 20 MG tablet Take 2 tablets daily with breakfast. 07/07/21  Yes Jaynee Eagles, PA-C  simvastatin (ZOCOR) 40 MG tablet TAKE 1 TABLET(40 MG) BY MOUTH EVERY EVENING Patient taking differently: Take 40 mg by mouth every evening. 01/22/21  Yes Branch, Alphonse Guild, MD  vitamin B-12 (CYANOCOBALAMIN) 100 MCG tablet Take 100 mcg by mouth daily.   Yes [provider]  Cyanocobalamin (B-12 COMPLIANCE INJECTION IJ) Inject 1 Dose as directed every 30 (thirty) days. Patient not taking: Reported on 05/06/2021    [provider]    Allergies    Patient has no known allergies.  Review of Systems   Review of Systems   Constitutional:  Negative for chills and fever.  HENT:  Negative for ear pain and sore throat.   Eyes:  Negative for pain and visual disturbance.  Respiratory:  Positive for shortness of breath. Negative for cough.   Cardiovascular:  Positive for chest pain. Negative for palpitations.  Gastrointestinal:  Negative for abdominal pain and vomiting.  Genitourinary:  Negative for dysuria and hematuria.  Musculoskeletal:  Negative for arthralgias and back pain.  Skin:  Negative for color change and rash.  Neurological:  Negative for seizures and syncope.  All other systems reviewed and are negative.  Physical Exam Updated Vital Signs BP (!) 158/84   Pulse 79   Temp 98.2 F (36.8 C)   Resp (!) 21   Ht 1.829 m (6')   Wt 83 kg   SpO2 96%   BMI 24.82 kg/m   Physical Exam Vitals and nursing note reviewed.  Constitutional:      General: He is not in acute distress.    Appearance: Normal appearance. He is well-developed.  HENT:     Head: Normocephalic and atraumatic.  Eyes:     Extraocular Movements: Extraocular movements intact.     Conjunctiva/sclera: Conjunctivae normal.     Pupils: Pupils are equal, round, and reactive to light.  Cardiovascular:     Rate and Rhythm: Normal rate and regular rhythm.     Heart sounds: No murmur heard. Pulmonary:     Effort: Pulmonary effort is normal. No respiratory distress.     Breath sounds: Normal breath sounds. No wheezing, rhonchi or rales.     Comments: No wheezing good air movement. Chest:     Chest wall: No tenderness.  Abdominal:     Palpations: Abdomen is soft.     Tenderness: There is no abdominal tenderness.  Musculoskeletal:        General: No swelling.     Cervical back: Normal range of motion and neck supple.  Skin:    General: Skin is warm and dry.     Capillary Refill: Capillary refill takes  less than 2 seconds.  Neurological:     General: No focal deficit present.     Mental Status: He is alert and oriented to person,  place, and time.  Psychiatric:        Mood and Affect: Mood normal.    ED Results / Procedures / Treatments   Labs (all labs ordered are listed, but only abnormal results are displayed) Labs Reviewed  CBC WITH DIFFERENTIAL/PLATELET - Abnormal; Notable for the following components:      Result Value   Neutro Abs 8.1 (*)    Abs Immature Granulocytes 0.14 (*)    All other components within normal limits  COMPREHENSIVE METABOLIC PANEL - Abnormal; Notable for the following components:   Glucose, Bld 103 (*)    Calcium 8.7 (*)    All other components within normal limits  TROPONIN I (HIGH SENSITIVITY)  TROPONIN I (HIGH SENSITIVITY)    EKG EKG Interpretation  Date/Time:  Thursday July 31 2021 10:18:27 EST Ventricular Rate:  81 PR Interval:  173 QRS Duration: 104 QT Interval:  380 QTC Calculation: 442 R Axis:   2 Text Interpretation: Sinus rhythm Atrial premature complex No significant change since last tracing Confirmed by Fredia Sorrow [phone]) on 07/31/2021 11:06:30 AM  Radiology DG Chest Port 1 View  Result Date: 07/31/2021 CLINICAL DATA:  Chest pain, wheezing, history of COPD EXAM: PORTABLE CHEST 1 VIEW COMPARISON:  Chest radiograph 07/07/2021 FINDINGS: The cardiomediastinal silhouette is stable. The upper lungs are hyperlucent consistent with underlying COPD. Increased interstitial markings are also likely chronic in nature. There is no focal consolidation or pulmonary edema. There is no pleural effusion or pneumothorax. There is no acute osseous abnormality. IMPRESSION: COPD. Otherwise, no radiographic evidence of acute cardiopulmonary process. Electronically Signed   By: Valetta Mole M.D.   On: 07/31/2021 11:41    Procedures Procedures   Medications Ordered in ED Medications  ipratropium-albuterol (DUONEB) 0.5-2.5 (3) MG/3ML nebulizer solution 3 mL (3 mLs Nebulization Given 07/31/21 1421)    ED Course  I have reviewed the triage vital signs and the nursing  notes.  Pertinent labs & imaging results that were available during my care of the patient were reviewed by me and considered in my medical decision making (see chart for details).    MDM Rules/Calculators/A&P                           Patient with chest pain for 2 hours starting at 5:30 in the morning.  Will need delta troponins.  Has a history of coronary artery disease.  But no problems since the late 1990s.  Lungs are clear here today.  No wheezing.  Patient given a duo nebulizer treatment here.  Patient already on steroids.  Work-up for cardiac events negative with normal troponins.  Chest x-ray looks more like COPD.  No leukocytosis on CBC.  Complete metabolic panel without any acute abnormalities other than calcium being slightly low at 8.7  Patient feels much better after his duo nebulizer.  And is ready to go home.  He is already on steroids.  His wife contacted primary care there can have nebulizer available for him to pick up in the next 2 days.  In the meantime he has an inhaler.  Patient wants to go home and appears to be stable for discharge home.  Final Clinical Impression(s) / ED Diagnoses Final diagnoses:  Chronic obstructive pulmonary disease with acute exacerbation ([place])  Atypical chest pain  Rx / DC Orders ED Discharge Orders     None        Fredia Sorrow, MD 07/31/21 [phone]

## 2021-07-31 NOTE — ED Notes (Signed)
Patient is being discharged from the Urgent Care and sent to the Emergency Department via EMS . Per Dr. Alphonzo Cruise, patient is in need of higher level of care due to [place]. Patient is aware and verbalizes understanding of plan of care.  Vitals:   07/31/21 0911  BP: (!) 165/95  Pulse: 83  Resp: 18  Temp: 98.6 F (37 C)  SpO2: 96%

## 2021-07-31 NOTE — Discharge Instructions (Signed)
Use your albuterol inhaler and when you get your nebulizer use that every 6 hours.  Continue steroids.  Work-up for the chest pain without any acute findings.  But based on your age would recommend follow-up with cardiology information provided above.  Return for any new or worse symptoms.  Chest x-ray was negative feel that most of your symptoms are related to your COPD.

## 2021-07-31 NOTE — ED Provider Notes (Addendum)
HPI  SUBJECTIVE:  Jeremy [name] is a 85 y.o. male who presents with 2 hours of substernal chest pain described as tightness, pressure starting at 530 this morning, completely resolving at 730.  No diaphoresis, nausea, radiation of this pain up his neck, down his arm or through to his back.  It is not known if it gets worse with exertion.  No palpitations, syncope, abdominal pain.  No change in his baseline shortness of breath.  Wife states that he told her that this pain felt similar to previous MI.  He also has had chest congestion, wheezing and worsening shortness of breath over the past week.  She reports a cough productive of white-yellow sputum, more than usual and it has changed in color.  He is currently on prednisone for COPD exacerbation.  He is using his albuterol rescue inhaler and his steroid inhaler.  He has a past medical history of MI x2 status post stenting, COPD, hypertension, hypercholesterolemia, coronary artery disease.  He quit smoking 30 years ago.  He has not taken any aspirin today.  All history obtained through wife.   Past Medical History:  Diagnosis Date   Abnormal CXR 4/20011   Chest  CT mode/serere COPD ( no mediastinal abnormality)   CAD (coronary artery disease) 2005   anterior MI with stent to the LAD in 1991 / stent 1996 / nuclear 2005, no ischemia   Carotid bruit    Doppler, June, 2013, 0-39% bilateral   COPD (chronic obstructive pulmonary disease) ([place])    Diverticula of colon    Ejection fraction    55-60%, echo, April, 2011, could not estimate RV pressure   Esophagitis    HOH (hard of hearing)    Hyperlipidemia    Hypertension    Myocardial infarction ([place])    x2   Shortness of breath    with exertion   Sleep apnea    Stop Bang score of 5   Tobacco abuse     Past Surgical History:  Procedure Laterality Date   APPENDECTOMY     BIOPSY  11/17/2017   Procedure: BIOPSY;  Surgeon: Rogene Houston, MD;  [facility];  Service: Endoscopy;;   gastric   CARDIAC CATHETERIZATION     CATARACT EXTRACTION Left    CATARACT EXTRACTION W/PHACO Right 01/22/2014   Procedure: CATARACT EXTRACTION PHACO AND INTRAOCULAR LENS PLACEMENT ([place]);  Surgeon: Tonny Branch, MD;  Location: [facility];  Service: Ophthalmology;  Laterality: Right;  CDE 7.46   CHOLECYSTECTOMY     8 years ago [facility]   COLONOSCOPY  10/05/2012   Dr. Laural Golden: multiple diverticula at sigmoid colon, multiple polyps (tubular adenomas), small external hemorrhoids   COLONOSCOPY N/A 11/18/2017   Procedure: COLONOSCOPY;  Surgeon: Rogene Houston, MD;  [facility];  Service: Endoscopy;  Laterality: N/A;   COLONOSCOPY WITH PROPOFOL N/A 05/07/2021   Procedure: COLONOSCOPY WITH PROPOFOL;  Surgeon: Rogene Houston, MD;  [facility];  Service: Endoscopy;  Laterality: N/A;   CYST REMOVAL HAND     right wrist   ESOPHAGEAL DILATION N/A 05/23/2015   Procedure: ESOPHAGEAL DILATION;  Surgeon: Daneil Dolin, MD;  [facility];  Service: Endoscopy;  Laterality: N/A;   ESOPHAGEAL DILATION N/A 11/17/2017   Procedure: ESOPHAGEAL DILATION;  Surgeon: Rogene Houston, MD;  [facility];  Service: Endoscopy;  Laterality: N/A;   ESOPHAGEAL DILATION N/A 05/07/2021   Procedure: ESOPHAGEAL DILATION;  Surgeon: Rogene Houston, MD;  [facility];  Service: Endoscopy;  Laterality: N/A;   ESOPHAGOGASTRODUODENOSCOPY N/A 05/23/2015   Dr. Gala Romney: Ulcerative/ erosive reflux esophagitis with peptic stricture formation. Status post dilation as described. Hiatal hernia. Abnormal gastric mucosa of uncertain significance status post gastric biospy. +H.pylori   ESOPHAGOGASTRODUODENOSCOPY N/A 11/17/2017   Procedure: ESOPHAGOGASTRODUODENOSCOPY (EGD);  Surgeon: Rogene Houston, MD;  [facility];  Service: Endoscopy;  Laterality: N/A;   ESOPHAGOGASTRODUODENOSCOPY (EGD) WITH PROPOFOL N/A 05/07/2021   Procedure: ESOPHAGOGASTRODUODENOSCOPY (EGD) WITH PROPOFOL;  Surgeon: Rogene Houston, MD;  [facility];  Service: Endoscopy;  Laterality: N/A;   PERCUTANEOUS STENT INTERVENTION     SKIN CANCER EXCISION  2015   ear & neck   TONSILLECTOMY      Family History  Problem Relation Age of Onset   Heart disease Mother    Heart disease Father    Heart disease Brother    Stomach cancer Brother    Colon cancer Neg Hx     Social History   Tobacco Use   Smoking status: Former    Packs/day: 3.00    Years: 12.00    Pack years: 36.00    Types: Cigarettes    Start date: 09/15/1983    Quit date: 09/14/1994    Years since quitting: 26.8   Smokeless tobacco: Current    Types: Chew   Tobacco comments:    chews 80% of a pack tobacco per day -- 10/12 doesnt chew too much  Vaping Use   Vaping Use: Never used  Substance Use Topics   Alcohol use: No    Alcohol/week: 0.0 standard drinks   Drug use: No    No current facility-administered medications for this encounter.  Current Outpatient Medications:    acetaminophen (TYLENOL) 500 MG tablet, Take 500-1,000 mg by mouth daily as needed for moderate pain or headache., Disp: , Rfl:    albuterol (PROVENTIL HFA;VENTOLIN HFA) 108 (90 Base) MCG/ACT inhaler, Inhale 1-2 puffs into the lungs every 6 (six) hours as needed for wheezing or shortness of breath., Disp: , Rfl:    aspirin 81 MG tablet, Take 1 tablet (81 mg total) by mouth every evening., Disp: 30 tablet, Rfl:    Cyanocobalamin (B-12 COMPLIANCE INJECTION IJ), Inject 1 Dose as directed every 30 (thirty) days. (Patient not taking: Reported on 05/06/2021), Disp: , Rfl:    diphenhydrAMINE (BENADRYL) 25 MG tablet, Take 12.5 mg by mouth at bedtime as needed for sleep., Disp: , Rfl:    lisinopril (ZESTRIL) 5 MG tablet, TAKE 1 TABLET BY MOUTH DAILY (Patient taking differently: Take 5 mg by mouth daily.), Disp: 90 tablet, Rfl: 3   loratadine (CLARITIN) 10 MG tablet, Take 10 mg by mouth daily as needed for allergies., Disp: , Rfl:    Menthol, Topical Analgesic, (ICY HOT EX),  Apply 1 application topically daily as needed (pain)., Disp: , Rfl:    nitroGLYCERIN (NITROSTAT) 0.4 MG SL tablet, PLACE 1 TABLET UNDER THE TONGUE EVERY 5 MINUTES FOR 3 DOSES AS NEEDED FOR CHEST PAIN (Patient taking differently: Place 0.4 mg under the tongue every 5 (five) minutes as needed for chest pain.), Disp: 75 tablet, Rfl: 3   pantoprazole (PROTONIX) 40 MG tablet, Take 1 tablet (40 mg total) by mouth 2 (two) times daily., Disp: 90 tablet, Rfl: 1   predniSONE (DELTASONE) 20 MG tablet, Take 2 tablets daily with breakfast., Disp: 10 tablet, Rfl: 0   simvastatin (ZOCOR) 40 MG tablet, TAKE 1 TABLET(40 MG) BY MOUTH EVERY  EVENING (Patient taking differently: Take 40 mg by mouth every evening.), Disp: 90 tablet, Rfl: 3  No Known Allergies   ROS  As noted in HPI.   Physical Exam  BP (!) 165/95   Pulse 83   Temp 98.6 F (37 C) (Oral)   Resp 18   SpO2 96%   Constitutional: Well developed, well nourished, no acute distress Eyes:  EOMI, conjunctiva normal bilaterally HENT: Normocephalic, atraumatic,mucus membranes moist Respiratory: Normal inspiratory effort.  Diffuse expiratory wheezing throughout all lung for Cardiovascular: Normal rate, regular rhythm, no murmurs rubs or gallop GI: nondistended skin: No rash, skin intact Musculoskeletal: no deformities Neurologic: Alert & oriented x 3, no focal neuro deficits Psychiatric: Speech and behavior appropriate   ED Course   Medications  aspirin chewable tablet 324 mg (324 mg Oral Given 07/31/21 0933)    Orders Placed This Encounter  Procedures   ED EKG    Standing Status:   Standing    Number of Occurrences:   1    Order Specific Question:   Reason for Exam    Answer:   Chest Pain   EKG 12-Lead    Standing Status:   Standing    Number of Occurrences:   1   EKG 12-Lead    Standing Status:   Standing    Number of Occurrences:   1    No results found for this or any previous visit (from the past 24 hour(s)). No results  found.  ED Clinical Impression  1. Chest pain, unspecified type   2. COPD exacerbation [facility])      ED Assessment/Plan  EKG #1.  Sinus rhythm, rate 85.  Normal axis.  Normal intervals.  Nonspecific ST changes.  Baseline wander.  Patient asymptomatic while EKG was obtained.  EKG #2: Normal sinus rhythm, rate 73.  Normal axis, normal intervals.  No ST or T wave changes.  Both EKGs negative for ST elevation, however, patient is completely asymptomatic currently.  Concern for cardiac cause of his chest pain and for a worsening COPD exacerbation.  Transferring to the ED for further work-up via EMS for cardiac monitoring on route in case symptoms recur.  Patient was given 324 mg of aspirin here.  Discussed medical decision making, rationale for transfer to the emergency department via EMS with the patient and spouse.  They agree with plan  Meds ordered this encounter  Medications   aspirin chewable tablet 324 mg      *This clinic note was created using Dragon dictation software. Therefore, there may be occasional mistakes despite careful proofreading.  ?    Melynda Ripple, MD 07/31/21 1610    Melynda Ripple, MD 07/31/21 [phone]

## 2021-07-31 NOTE — ED Triage Notes (Addendum)
Pt brought in by ems from urgent care for cp.  Pt noted to be wheezing on arrival to the ed.  Reports that the cp woke him from his sleep this am and lasted about 1.5 hours.  Increased wob noted.  324 asa given at [facility]  Hx of copd and mi x 2.

## 2021-08-11 DIAGNOSIS — E539 Vitamin B deficiency, unspecified: Secondary | ICD-10-CM | POA: Diagnosis not present

## 2021-08-11 DIAGNOSIS — I10 Essential (primary) hypertension: Secondary | ICD-10-CM | POA: Diagnosis not present

## 2021-08-18 DIAGNOSIS — N1831 Chronic kidney disease, stage 3a: Secondary | ICD-10-CM | POA: Diagnosis not present

## 2021-08-18 DIAGNOSIS — J449 Chronic obstructive pulmonary disease, unspecified: Secondary | ICD-10-CM | POA: Diagnosis not present

## 2021-08-18 DIAGNOSIS — E539 Vitamin B deficiency, unspecified: Secondary | ICD-10-CM | POA: Diagnosis not present

## 2021-08-18 DIAGNOSIS — I10 Essential (primary) hypertension: Secondary | ICD-10-CM | POA: Diagnosis not present

## 2021-08-18 DIAGNOSIS — E782 Mixed hyperlipidemia: Secondary | ICD-10-CM | POA: Diagnosis not present

## 2021-08-18 DIAGNOSIS — Z23 Encounter for immunization: Secondary | ICD-10-CM | POA: Diagnosis not present

## 2021-08-18 DIAGNOSIS — J309 Allergic rhinitis, unspecified: Secondary | ICD-10-CM | POA: Diagnosis not present

## 2021-08-28 DIAGNOSIS — J449 Chronic obstructive pulmonary disease, unspecified: Secondary | ICD-10-CM | POA: Diagnosis not present

## 2021-09-30 ENCOUNTER — Other Ambulatory Visit (HOSPITAL_COMMUNITY): Payer: Self-pay | Admitting: Nurse Practitioner

## 2021-09-30 ENCOUNTER — Other Ambulatory Visit: Payer: Self-pay

## 2021-09-30 ENCOUNTER — Ambulatory Visit (HOSPITAL_COMMUNITY)
Admission: RE | Admit: 2021-09-30 | Discharge: 2021-09-30 | Disposition: A | Discharge status: Home / Self Care | Payer: Medicare Other | Source: Ambulatory Visit | Attending: Nurse Practitioner | Admitting: Nurse Practitioner

## 2021-09-30 DIAGNOSIS — J441 Chronic obstructive pulmonary disease with (acute) exacerbation: Secondary | ICD-10-CM

## 2021-09-30 DIAGNOSIS — R062 Wheezing: Secondary | ICD-10-CM | POA: Diagnosis not present

## 2021-09-30 DIAGNOSIS — J449 Chronic obstructive pulmonary disease, unspecified: Secondary | ICD-10-CM | POA: Diagnosis not present

## 2021-10-13 ENCOUNTER — Encounter: Payer: Self-pay | Admitting: Internal Medicine

## 2021-10-13 ENCOUNTER — Ambulatory Visit (INDEPENDENT_AMBULATORY_CARE_PROVIDER_SITE_OTHER): Payer: Medicare Other | Admitting: Internal Medicine

## 2021-10-13 ENCOUNTER — Other Ambulatory Visit: Payer: Self-pay

## 2021-10-13 DIAGNOSIS — J449 Chronic obstructive pulmonary disease, unspecified: Secondary | ICD-10-CM

## 2021-10-13 DIAGNOSIS — I10 Essential (primary) hypertension: Secondary | ICD-10-CM

## 2021-10-13 MED ORDER — BREZTRI AEROSPHERE 160-9-4.8 MCG/ACT IN AERO: 2.0000 | INHALATION_SPRAY | Freq: Two times a day (BID) | RESPIRATORY_TRACT | 0 refills | Status: DC

## 2021-10-13 NOTE — Assessment & Plan Note (Signed)
rec trial off acei 10/13/2021 but defer to PCP   In the best review of chronic cough to date ( NEJM 2016 375 [id number]) ,  ACEi are now felt to cause cough in up to  20% of pts which is a 4 fold increase from previous reports and does not include the variety of non-specific complaints we see in pulmonary clinic in pts on ACEi but previously attributed to another dx like  Copd/asthma and  include PNDS, throat  congestion, "bronchitis", unexplained dyspnea and noct "strangling" sensations, and hoarseness, but also  atypical /refractory GERD symptoms like dysphagia and "bad heartburn"   The only way I know  to prove this is not an "ACEi Case" is a trial off ACEi x a minimum of 6 weeks then regroup.   >>> since doing so much better will defer to PCP re trial off acei           Each maintenance medication was reviewed in detail including emphasizing most importantly the difference between maintenance and prns and under what circumstances the prns are to be triggered using an action plan format where appropriate.  Total time for H and P, chart review, counseling, reviewing hfa/ neb device(s) and generating customized AVS unique to this office visit / same day charting = 46 min

## 2021-10-13 NOTE — Addendum Note (Signed)
Addended by: Elby Beck R on: 10/13/2021 12:15 PM   Modules accepted: Orders

## 2021-10-13 NOTE — Assessment & Plan Note (Signed)
Quit smoking 1990  - Spirometry 11/08/15   FEV1 1.89 (58%)  Ratio 0.49 p 10% improvement from saba  - 10/13/2021  After extensive coaching inhaler device,  effectiveness =    75% (short Ti)    Group D in terms of symptom/risk and laba/lama/ICS  therefore appropriate rx at this point >>>  Continue breztri - since doing fine on one bid he can add the extra dose for max of 2 q 12h but don't think he'll need it   When respiratory symptoms begin or become refractory well after a patient reports complete smoking cessation,  Especially when this wasn't the case while they were smoking, a red flag is raised based on the work of Dr Kris Mouton which states:  if you quit smoking when your best day FEV1 is still well preserved it is highly unlikely you will progress to severe disease.  That is to say, once the smoking stops,  the symptoms should not suddenly erupt or markedly worsen.  If so, the differential diagnosis should include  obesity/deconditioning,  LPR/Reflux(which we know he has and under rx with ppi bid per Laural Golden) /Aspiration syndromes,  occult CHF, or  especially side effect of medications commonly used in this population - esp ACEi - see sep a/p  Will rec diet to go along with gerd rx ( NO PEPPERMINTS) and f/u in 3 m with pfts.

## 2021-10-13 NOTE — Progress Notes (Signed)
Jeremy [name], male    DOB: 1935/10/01,    MRN: [id number]   Brief patient profile:  63  yowm quit smoking 1990 p MI  referred to pulmonary clinic in [place]  10/13/2021 by Dr Jeremy [name] for copd eval with Nov 2022 > ER with dx aecopd with GOLD 2 criteria in 2017 but not requiring any maint rx up until Nov 2022     History of Present Illness  10/13/2021  Pulmonary/ 1st office eval/ Jeremy [name] / [name] / on ACEi/breztri  Chief Complaint  Patient presents with   Consult    Patient had covid in 2019. Patient states that he does feel better now. States that he really needed to be seen a month ago but is better now. States that he is able to walk longer distance now than before.   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 Much worse in am's historically but better now  Cough: none but still urge to clear his throat Sleep: bed blocks/ one pillow SABA use: much less now breztri one bid / maybe one albuterol hfa/ neb one each am   No obvious day to day or daytime variability or assoc excess/ purulent sputum or mucus plugs or hemoptysis or cp or chest tightness, subjective wheeze or overt sinus or hb symptoms.   Sleeping  without nocturnal  or early am exacerbation  of respiratory  c/o's or need for noct saba. Also denies any obvious fluctuation of symptoms with weather or environmental changes or other aggravating or alleviating factors except as outlined above   No unusual exposure hx or h/o childhood pna/ asthma or knowledge of premature birth.  Current Allergies, Complete Past Medical History, Past Surgical History, Family History, and Social History were reviewed in [employer] [facility] record.  ROS  The following are not active complaints unless bolded Hoarseness, sore throat, dysphagia, dental problems, itching, sneezing,  nasal congestion or discharge of excess mucus or purulent secretions, ear ache,   fever, chills, sweats,  unintended wt loss or wt gain, classically pleuritic or exertional cp,  orthopnea pnd or arm/hand swelling  or leg swelling, presyncope, palpitations, abdominal pain, anorexia, nausea, vomiting, diarrhea  or change in bowel habits or change in bladder habits, change in stools or change in urine, dysuria, hematuria,  rash, arthralgias, visual complaints, headache, numbness, weakness or ataxia or problems with walking or coordination,  change in mood or  memory.           Past Medical History:  Diagnosis Date   Abnormal CXR 4/20011   Chest  CT mode/serere COPD ( no mediastinal abnormality)   CAD (coronary artery disease) 2005   anterior MI with stent to the LAD in 1991 / stent 1996 / nuclear 2005, no ischemia   Carotid bruit    Doppler, June, 2013, 0-39% bilateral   COPD (chronic obstructive pulmonary disease) ([place])    Diverticula of colon    Ejection fraction    55-60%, echo, April, 2011, could not estimate RV pressure   Esophagitis    HOH (hard of hearing)    Hyperlipidemia    Hypertension    Myocardial infarction ([place])    x2   Shortness of breath    with exertion   Sleep apnea    Stop Bang score of 5   Tobacco abuse     Outpatient Medications Prior to Visit  Medication Sig Dispense Refill   acetaminophen (TYLENOL)  500 MG tablet Take 500-1,000 mg by mouth daily as needed for moderate pain or headache.     albuterol (PROVENTIL HFA;VENTOLIN HFA) 108 (90 Base) MCG/ACT inhaler Inhale 1-2 puffs into the lungs every 6 (six) hours as needed for wheezing or shortness of breath.     aspirin 81 MG tablet Take 1 tablet (81 mg total) by mouth every evening. 30 tablet    BREZTRI AEROSPHERE 160-9-4.8 MCG/ACT AERO Inhale 1 puffs into the lungs 2 (two) times daily.     Cyanocobalamin (B-12 COMPLIANCE INJECTION IJ) Inject 1 Dose as directed every 30 (thirty) days. (Patient not taking: Reported on 05/06/2021)     diphenhydrAMINE (BENADRYL) 25 MG tablet Take 12.5 mg by mouth at bedtime as needed for  sleep.     levocetirizine (XYZAL) 5 MG tablet Take 5 mg by mouth daily.     lisinopril (ZESTRIL) 5 MG tablet TAKE 1 TABLET BY MOUTH DAILY (Patient taking differently: Take 5 mg by mouth daily.) 90 tablet 3   Menthol, Topical Analgesic, (ICY HOT EX) Apply 1 application topically daily as needed (pain).     nitroGLYCERIN (NITROSTAT) 0.4 MG SL tablet PLACE 1 TABLET UNDER THE TONGUE EVERY 5 MINUTES FOR 3 DOSES AS NEEDED FOR CHEST PAIN (Patient taking differently: Place 0.4 mg under the tongue every 5 (five) minutes as needed for chest pain.) 75 tablet 3   pantoprazole (PROTONIX) 40 MG tablet Take 1 tablet (40 mg total) by mouth 2 (two) times daily. 90 tablet 1          simvastatin (ZOCOR) 40 MG tablet TAKE 1 TABLET(40 MG) BY MOUTH EVERY EVENING (Patient taking differently: Take 40 mg by mouth every evening.) 90 tablet 3   vitamin B-12 (CYANOCOBALAMIN) 100 MCG tablet Take 100 mcg by mouth daily.          Objective:     BP 110/68 (BP Location: Left Arm, Patient Position: Sitting, Cuff Size: Normal)    Pulse 78    Temp 98.7 F (37.1 C) (Oral)    Ht 6' (1.829 m)    Wt 187 lb (84.8 kg)    SpO2 97%    BMI 25.36 kg/m   SpO2: 97 %  RA  Elderly wm with freq harsh throat clearing and very difficult hearing  HEENT : pt wearing mask not removed for exam due to covid - 19 concerns.   NECK :  without JVD/Nodes/TM/ nl carotid upstrokes bilaterally   LUNGS: no acc muscle use,  Min barrel  contour chest wall with bilateral  slightly decreased bs s audible wheeze and  without cough on insp or exp maneuvers and min  Hyperresonant  to  percussion bilaterally     CV:  RRR  no s3 or murmur or increase in P2, and no edema   ABD:  soft and nontender with pos end  insp Hoover's  in the supine position. No bruits or organomegaly appreciated, bowel sounds nl  MS:   Nl gait/  ext warm without deformities, calf tenderness, cyanosis or clubbing No obvious joint restrictions   SKIN: warm and dry without lesions     NEURO:  alert, approp, nl sensorium with  no motor or cerebellar deficits apparent.        I personally reviewed images and agree with radiology impression as follows:  CXR:   pa and lateral  09/30/21  Similar findings of lung hyperexpansion without superimposed acute cardiopulmonary disease.    Assessment   COPD GOLD 2  Quit  smoking 1990  - Spirometry 11/08/15   FEV1 1.89 (58%)  Ratio 0.49 p 10% improvement from saba  - 10/13/2021  After extensive coaching inhaler device,  effectiveness =    75% (short Ti)    Group D in terms of symptom/risk and laba/lama/ICS  therefore appropriate rx at this point >>>  Continue breztri - since doing fine on one bid he can add the extra dose for max of 2 q 12h but don't think he'll need it   When respiratory symptoms begin or become refractory well after a patient reports complete smoking cessation,  Especially when this wasn't the case while they were smoking, a red flag is raised based on the work of Dr Kris Mouton which states:  if you quit smoking when your best day FEV1 is still well preserved it is highly unlikely you will progress to severe disease.  That is to say, once the smoking stops,  the symptoms should not suddenly erupt or markedly worsen.  If so, the differential diagnosis should include  obesity/deconditioning,  LPR/Reflux(which we know he has and under rx with ppi bid per Laural Golden) /Aspiration syndromes,  occult CHF, or  especially side effect of medications commonly used in this population - esp ACEi - see sep a/p  Will rec diet to go along with gerd rx ( NO PEPPERMINTS) and f/u in 3 m with pfts.       Essential hypertension rec trial off acei 10/13/2021 but defer to PCP   In the best review of chronic cough to date ( NEJM 2016 375 [id number]) ,  ACEi are now felt to cause cough in up to  20% of pts which is a 4 fold increase from previous reports and does not include the variety of non-specific complaints we see in pulmonary clinic in  pts on ACEi but previously attributed to another dx like  Copd/asthma and  include PNDS, throat  congestion, "bronchitis", unexplained dyspnea and noct "strangling" sensations, and hoarseness, but also  atypical /refractory GERD symptoms like dysphagia and "bad heartburn"   The only way I know  to prove this is not an "ACEi Case" is a trial off ACEi x a minimum of 6 weeks then regroup.   >>> since doing so much better will defer to PCP re trial off acei           Each maintenance medication was reviewed in detail including emphasizing most importantly the difference between maintenance and prns and under what circumstances the prns are to be triggered using an action plan format where appropriate.  Total time for H and P, chart review, counseling, reviewing hfa/ neb device(s) and generating customized AVS unique to this office visit / same day charting = 46 min           Christinia Gully, MD 10/13/2021

## 2021-10-13 NOTE — Patient Instructions (Addendum)
Ok to continue lisinopril but it may be part of the problem with your throat (clearing, choking, gagging, something  stuck, problem swallowing)     Plan A = Automatic = Always=    Breztri Take 1-2 puffs first thing in am and then another 2 puffs about 12 hours later.    Work on inhaler technique:  relax and gently blow all the way out then take a nice smooth full deep breath back in, triggering the inhaler at same time you start breathing in.  Hold for up to 5 seconds if you can. Blow out thru nose. Rinse and gargle with water when done.  If mouth or throat bother you at all,  try brushing teeth/gums/tongue with arm and hammer toothpaste/ make a slurry and gargle and spit out.       Plan B = Backup (to supplement plan A, not to replace it) Only use your albuterol inhaler as a rescue medication to be used if you can't catch your breath by resting or doing a relaxed purse lip breathing pattern.  - The less you use it, the better it will work when you need it. - Ok to use the inhaler up to 2 puffs  every 4 hours if you must but call for appointment if use goes up over your usual need - Don't leave home without it !!  (think of it like the spare tire for your car)   Plan C = Crisis (instead of Plan B but only if Plan B stops working) - only use your albuterol nebulizer if you first try Plan B and it fails to help > ok to use the nebulizer up to every 4 hours but if start needing it regularly call for immediate appointment  Ok to try albuterol 15 min before an activity (on alternating days)  that you know would usually make you short of breath and see if it makes any difference and if makes none then don't take albuterol after activity unless you can't catch your breath as this means it's the resting that helps, not the albuterol.  GERD (REFLUX)  is an extremely common cause of respiratory symptoms just like yours , many times with no obvious heartburn at all.    It can be treated with medication,  but also with lifestyle changes including elevation of the head of your bed (ideally with 6 -8inch blocks under the headboard of your bed),  Smoking cessation, avoidance of late meals, excessive alcohol, and avoid fatty foods, chocolate, peppermint, colas, red wine, and acidic juices such as orange juice.  NO MINT OR MENTHOL PRODUCTS SO NO COUGH DROPS  USE SUGARLESS CANDY INSTEAD (Jolley ranchers or Stover's or Life Savers) or even ice chips will also do - the key is to swallow to prevent all throat clearing. NO OIL BASED VITAMINS - use powdered substitutes.  Avoid fish oil when coughing.        Please schedule a follow up visit in 3 months but call sooner if needed with pfts with inhalers in hand

## 2021-10-14 DIAGNOSIS — I10 Essential (primary) hypertension: Secondary | ICD-10-CM | POA: Diagnosis not present

## 2021-10-14 DIAGNOSIS — E782 Mixed hyperlipidemia: Secondary | ICD-10-CM | POA: Diagnosis not present

## 2021-10-20 ENCOUNTER — Other Ambulatory Visit: Payer: Self-pay | Admitting: Cardiology

## 2021-11-19 ENCOUNTER — Other Ambulatory Visit: Payer: Self-pay | Admitting: Cardiology

## 2021-11-19 ENCOUNTER — Other Ambulatory Visit: Payer: Self-pay

## 2021-11-19 ENCOUNTER — Telehealth: Payer: Self-pay | Admitting: Internal Medicine

## 2021-11-19 MED ORDER — PREDNISONE 10 MG PO TABS: ORAL_TABLET | ORAL | 0 refills | Status: DC

## 2021-11-19 NOTE — Telephone Encounter (Signed)
Pt is SOB and has clear congestion. States this has been going on for about two days now, but that he has also been outside in the pollen. Offered acute visit in [place] today, but she denied appt. Nothing availble in [facility].  Pharmacy is [facility] on [address] ?

## 2021-11-19 NOTE — Progress Notes (Signed)
Corrected prednisone order to reflect accurate number of tablets  ?

## 2021-11-19 NOTE — Telephone Encounter (Signed)
I called the wife and she reports that he is having some shortness of breath with exertion, has some clear sputum, and  was outside doing yard work x 3 days. He wanted to know if he would need some prednisone. The wife denies any other symptoms. Please advise on medication. Does he need to start Claritin?  ?

## 2021-11-19 NOTE — Telephone Encounter (Signed)
Prednisone 10 mg take  4 each am x 2 days,   2 each am x 2 days,  1 each am x 2 days and stop  ? ?Clariton is prn itching sneezing but won't directly help a lung problem ?

## 2021-11-19 NOTE — Telephone Encounter (Signed)
Called patient's wife but she did not answer. Left message for her to call back.  ?

## 2021-11-19 NOTE — Telephone Encounter (Signed)
Patients wife returned call. Let her know Dr. Morrison Old recs. Nothing further needed. Medication sent to [facility] in [place]  ?

## 2021-11-22 ENCOUNTER — Encounter (HOSPITAL_COMMUNITY): Payer: Self-pay

## 2021-11-22 ENCOUNTER — Emergency Department (HOSPITAL_COMMUNITY): Payer: Medicare Other

## 2021-11-22 ENCOUNTER — Inpatient Hospital Stay (HOSPITAL_COMMUNITY)
Admission: EM | Admit: 2021-11-22 | Discharge: 2021-11-27 | DRG: 192 | Disposition: A | Discharge status: Home / Self Care | Payer: Medicare Other | Attending: Internal Medicine | Admitting: Internal Medicine

## 2021-11-22 ENCOUNTER — Other Ambulatory Visit: Payer: Self-pay

## 2021-11-22 DIAGNOSIS — H9190 Unspecified hearing loss, unspecified ear: Secondary | ICD-10-CM | POA: Diagnosis present

## 2021-11-22 DIAGNOSIS — I2510 Atherosclerotic heart disease of native coronary artery without angina pectoris: Secondary | ICD-10-CM | POA: Diagnosis present

## 2021-11-22 DIAGNOSIS — Z961 Presence of intraocular lens: Secondary | ICD-10-CM | POA: Diagnosis present

## 2021-11-22 DIAGNOSIS — I252 Old myocardial infarction: Secondary | ICD-10-CM | POA: Diagnosis not present

## 2021-11-22 DIAGNOSIS — Z7952 Long term (current) use of systemic steroids: Secondary | ICD-10-CM

## 2021-11-22 DIAGNOSIS — R0602 Shortness of breath: Secondary | ICD-10-CM | POA: Diagnosis not present

## 2021-11-22 DIAGNOSIS — J439 Emphysema, unspecified: Secondary | ICD-10-CM | POA: Diagnosis not present

## 2021-11-22 DIAGNOSIS — Z20822 Contact with and (suspected) exposure to covid-19: Secondary | ICD-10-CM | POA: Diagnosis present

## 2021-11-22 DIAGNOSIS — Z79899 Other long term (current) drug therapy: Secondary | ICD-10-CM | POA: Diagnosis not present

## 2021-11-22 DIAGNOSIS — E876 Hypokalemia: Secondary | ICD-10-CM | POA: Diagnosis present

## 2021-11-22 DIAGNOSIS — Z8249 Family history of ischemic heart disease and other diseases of the circulatory system: Secondary | ICD-10-CM

## 2021-11-22 DIAGNOSIS — J441 Chronic obstructive pulmonary disease with (acute) exacerbation: Secondary | ICD-10-CM | POA: Diagnosis not present

## 2021-11-22 DIAGNOSIS — J449 Chronic obstructive pulmonary disease, unspecified: Secondary | ICD-10-CM | POA: Diagnosis present

## 2021-11-22 DIAGNOSIS — Z9842 Cataract extraction status, left eye: Secondary | ICD-10-CM | POA: Diagnosis not present

## 2021-11-22 DIAGNOSIS — E785 Hyperlipidemia, unspecified: Secondary | ICD-10-CM | POA: Diagnosis present

## 2021-11-22 DIAGNOSIS — Z9049 Acquired absence of other specified parts of digestive tract: Secondary | ICD-10-CM | POA: Diagnosis not present

## 2021-11-22 DIAGNOSIS — G4730 Sleep apnea, unspecified: Secondary | ICD-10-CM | POA: Diagnosis present

## 2021-11-22 DIAGNOSIS — E782 Mixed hyperlipidemia: Secondary | ICD-10-CM | POA: Diagnosis present

## 2021-11-22 DIAGNOSIS — Z85828 Personal history of other malignant neoplasm of skin: Secondary | ICD-10-CM

## 2021-11-22 DIAGNOSIS — Z9841 Cataract extraction status, right eye: Secondary | ICD-10-CM | POA: Diagnosis not present

## 2021-11-22 DIAGNOSIS — K2100 Gastro-esophageal reflux disease with esophagitis, without bleeding: Secondary | ICD-10-CM | POA: Diagnosis not present

## 2021-11-22 DIAGNOSIS — Z7982 Long term (current) use of aspirin: Secondary | ICD-10-CM | POA: Diagnosis not present

## 2021-11-22 DIAGNOSIS — Z800 Family history of malignant neoplasm of digestive organs: Secondary | ICD-10-CM | POA: Diagnosis not present

## 2021-11-22 DIAGNOSIS — F17220 Nicotine dependence, chewing tobacco, uncomplicated: Secondary | ICD-10-CM | POA: Diagnosis present

## 2021-11-22 DIAGNOSIS — I10 Essential (primary) hypertension: Secondary | ICD-10-CM | POA: Diagnosis present

## 2021-11-22 LAB — CBC
HCT: 45.4 % (ref 39.0–52.0)
Hemoglobin: 14.6 g/dL (ref 13.0–17.0)
MCH: 28.3 pg (ref 26.0–34.0)
MCHC: 32.2 g/dL (ref 30.0–36.0)
MCV: 88.2 fL (ref 80.0–100.0)
Platelets: 194 10*3/uL (ref 150–400)
RBC: 5.15 MIL/uL (ref 4.22–5.81)
RDW: 15.3 % (ref 11.5–15.5)
WBC: 6.3 10*3/uL (ref 4.0–10.5)
nRBC: 0 % (ref 0.0–0.2)

## 2021-11-22 LAB — COMPREHENSIVE METABOLIC PANEL
ALT: 14 U/L (ref 0–44)
AST: 18 U/L (ref 15–41)
Albumin: 4 g/dL (ref 3.5–5.0)
Alkaline Phosphatase: 73 U/L (ref 38–126)
Anion gap: 10 (ref 5–15)
BUN: 19 mg/dL (ref 8–23)
CO2: 27 mmol/L (ref 22–32)
Calcium: 8.8 mg/dL — ABNORMAL LOW (ref 8.9–10.3)
Chloride: 106 mmol/L (ref 98–111)
Creatinine, Ser: 0.99 mg/dL (ref 0.61–1.24)
GFR, Estimated: >60 mL/min (ref >60)
Glucose, Bld: 137 mg/dL — ABNORMAL HIGH (ref 70–99)
Potassium: 3.1 mmol/L — ABNORMAL LOW (ref 3.5–5.1)
Sodium: 143 mmol/L (ref 135–145)
Total Bilirubin: 0.6 mg/dL (ref 0.3–1.2)
Total Protein: 7.1 g/dL (ref 6.5–8.1)

## 2021-11-22 LAB — RESP PANEL BY RT-PCR (FLU A&B, COVID) ARPGX2
Influenza A by PCR: NEGATIVE
Influenza B by PCR: NEGATIVE
SARS Coronavirus 2 by RT PCR: NEGATIVE

## 2021-11-22 LAB — BRAIN NATRIURETIC PEPTIDE: B Natriuretic Peptide: 45 pg/mL (ref 0.0–100.0)

## 2021-11-22 MED ORDER — ACETAMINOPHEN 650 MG RE SUPP: 650.0000 mg | Freq: Four times a day (QID) | RECTAL | Status: DC | PRN

## 2021-11-22 MED ORDER — BUDESONIDE 0.5 MG/2ML IN SUSP
0.5000 mg | Freq: Two times a day (BID) | RESPIRATORY_TRACT | Status: DC
  Administered 2021-11-22 – 2021-11-27 (×10): 0.5 mg via RESPIRATORY_TRACT
  Filled 2021-11-22 (×10): qty 2

## 2021-11-22 MED ORDER — ARFORMOTEROL TARTRATE 15 MCG/2ML IN NEBU
15.0000 ug | INHALATION_SOLUTION | Freq: Two times a day (BID) | RESPIRATORY_TRACT | Status: DC
  Administered 2021-11-22 – 2021-11-27 (×10): 15 ug via RESPIRATORY_TRACT
  Filled 2021-11-22 (×10): qty 2

## 2021-11-22 MED ORDER — ONDANSETRON HCL 4 MG/2ML IJ SOLN: 4.0000 mg | Freq: Four times a day (QID) | INTRAMUSCULAR | Status: DC | PRN

## 2021-11-22 MED ORDER — ENOXAPARIN SODIUM 40 MG/0.4ML IJ SOSY
40.0000 mg | PREFILLED_SYRINGE | INTRAMUSCULAR | Status: DC
  Administered 2021-11-22 – 2021-11-26 (×5): 40 mg via SUBCUTANEOUS
  Filled 2021-11-22 (×5): qty 0.4

## 2021-11-22 MED ORDER — IPRATROPIUM-ALBUTEROL 0.5-2.5 (3) MG/3ML IN SOLN
3.0000 mL | Freq: Once | RESPIRATORY_TRACT | Status: AC
  Administered 2021-11-22: 3 mL via RESPIRATORY_TRACT
  Filled 2021-11-22: qty 3

## 2021-11-22 MED ORDER — LEVALBUTEROL HCL 0.63 MG/3ML IN NEBU
0.6300 mg | INHALATION_SOLUTION | Freq: Four times a day (QID) | RESPIRATORY_TRACT | Status: DC
  Administered 2021-11-22 – 2021-11-26 (×13): 0.63 mg via RESPIRATORY_TRACT
  Filled 2021-11-22 (×13): qty 3

## 2021-11-22 MED ORDER — ACETAMINOPHEN 325 MG PO TABS: 650.0000 mg | ORAL_TABLET | Freq: Four times a day (QID) | ORAL | Status: DC | PRN

## 2021-11-22 MED ORDER — ASPIRIN EC 81 MG PO TBEC
81.0000 mg | DELAYED_RELEASE_TABLET | Freq: Every evening | ORAL | Status: DC
  Administered 2021-11-22 – 2021-11-26 (×5): 81 mg via ORAL
  Filled 2021-11-22 (×5): qty 1

## 2021-11-22 MED ORDER — ALBUTEROL SULFATE (2.5 MG/3ML) 0.083% IN NEBU
2.5000 mg | INHALATION_SOLUTION | Freq: Once | RESPIRATORY_TRACT | Status: AC
  Administered 2021-11-22: 2.5 mg via RESPIRATORY_TRACT
  Filled 2021-11-22: qty 3

## 2021-11-22 MED ORDER — DIPHENHYDRAMINE HCL 12.5 MG/5ML PO ELIX
12.5000 mg | ORAL_SOLUTION | Freq: Every evening | ORAL | Status: DC | PRN
  Administered 2021-11-22 – 2021-11-26 (×5): 12.5 mg via ORAL
  Filled 2021-11-22 (×5): qty 5

## 2021-11-22 MED ORDER — VITAMIN D 25 MCG (1000 UNIT) PO TABS
2000.0000 [IU] | ORAL_TABLET | Freq: Every day | ORAL | Status: DC
  Administered 2021-11-22 – 2021-11-27 (×6): 2000 [IU] via ORAL
  Filled 2021-11-22 (×11): qty 2

## 2021-11-22 MED ORDER — IPRATROPIUM BROMIDE 0.02 % IN SOLN
0.5000 mg | Freq: Four times a day (QID) | RESPIRATORY_TRACT | Status: DC
  Administered 2021-11-22 – 2021-11-24 (×5): 0.5 mg via RESPIRATORY_TRACT
  Filled 2021-11-22 (×5): qty 2.5

## 2021-11-22 MED ORDER — LISINOPRIL 5 MG PO TABS
5.0000 mg | ORAL_TABLET | Freq: Every day | ORAL | Status: DC
  Administered 2021-11-23 – 2021-11-25 (×3): 5 mg via ORAL
  Filled 2021-11-22 (×3): qty 1

## 2021-11-22 MED ORDER — PANTOPRAZOLE SODIUM 40 MG PO TBEC
40.0000 mg | DELAYED_RELEASE_TABLET | Freq: Two times a day (BID) | ORAL | Status: DC
  Administered 2021-11-22 – 2021-11-25 (×6): 40 mg via ORAL
  Filled 2021-11-22 (×6): qty 1

## 2021-11-22 MED ORDER — IPRATROPIUM BROMIDE 0.02 % IN SOLN: 0.5000 mg | Freq: Once | RESPIRATORY_TRACT | Status: DC

## 2021-11-22 MED ORDER — DIPHENHYDRAMINE HCL 25 MG PO TABS
12.5000 mg | ORAL_TABLET | Freq: Every evening | ORAL | Status: DC | PRN
  Filled 2021-11-22: qty 0.5

## 2021-11-22 MED ORDER — NITROGLYCERIN 0.4 MG SL SUBL
0.4000 mg | SUBLINGUAL_TABLET | Freq: Once | SUBLINGUAL | Status: AC
  Administered 2021-11-22: 0.4 mg via SUBLINGUAL
  Filled 2021-11-22: qty 1

## 2021-11-22 MED ORDER — VITAMIN B-12 100 MCG PO TABS
100.0000 ug | ORAL_TABLET | Freq: Every day | ORAL | Status: DC
  Administered 2021-11-23 – 2021-11-27 (×5): 100 ug via ORAL
  Filled 2021-11-22 (×5): qty 1

## 2021-11-22 MED ORDER — ALBUTEROL SULFATE (2.5 MG/3ML) 0.083% IN NEBU
5.0000 mg | INHALATION_SOLUTION | Freq: Once | RESPIRATORY_TRACT | Status: AC
  Administered 2021-11-22: 5 mg via RESPIRATORY_TRACT
  Filled 2021-11-22: qty 6

## 2021-11-22 MED ORDER — METHYLPREDNISOLONE SODIUM SUCC 125 MG IJ SOLR
120.0000 mg | Freq: Every day | INTRAMUSCULAR | Status: DC
  Administered 2021-11-23 – 2021-11-25 (×3): 120 mg via INTRAVENOUS
  Filled 2021-11-22 (×3): qty 2

## 2021-11-22 MED ORDER — ALBUTEROL SULFATE (2.5 MG/3ML) 0.083% IN NEBU: 5.0000 mg | INHALATION_SOLUTION | Freq: Once | RESPIRATORY_TRACT | Status: DC

## 2021-11-22 MED ORDER — METHYLPREDNISOLONE SODIUM SUCC 125 MG IJ SOLR
125.0000 mg | Freq: Once | INTRAMUSCULAR | Status: AC
  Administered 2021-11-22: 125 mg via INTRAVENOUS
  Filled 2021-11-22: qty 2

## 2021-11-22 MED ORDER — LORATADINE 10 MG PO TABS
10.0000 mg | ORAL_TABLET | Freq: Every day | ORAL | Status: DC
  Administered 2021-11-23 – 2021-11-27 (×5): 10 mg via ORAL
  Filled 2021-11-22 (×5): qty 1

## 2021-11-22 MED ORDER — ONDANSETRON HCL 4 MG PO TABS: 4.0000 mg | ORAL_TABLET | Freq: Four times a day (QID) | ORAL | Status: DC | PRN

## 2021-11-22 MED ORDER — GUAIFENESIN ER 600 MG PO TB12
1200.0000 mg | ORAL_TABLET | Freq: Two times a day (BID) | ORAL | Status: DC
  Administered 2021-11-22 – 2021-11-25 (×6): 1200 mg via ORAL
  Filled 2021-11-22 (×6): qty 2

## 2021-11-22 MED ORDER — IPRATROPIUM BROMIDE 0.02 % IN SOLN
0.5000 mg | Freq: Once | RESPIRATORY_TRACT | Status: AC
  Administered 2021-11-22: 0.5 mg via RESPIRATORY_TRACT
  Filled 2021-11-22: qty 2.5

## 2021-11-22 MED ORDER — SIMVASTATIN 20 MG PO TABS
40.0000 mg | ORAL_TABLET | Freq: Every evening | ORAL | Status: DC
  Administered 2021-11-22 – 2021-11-26 (×5): 40 mg via ORAL
  Filled 2021-11-22 (×5): qty 2

## 2021-11-22 MED ORDER — POTASSIUM CHLORIDE CRYS ER 20 MEQ PO TBCR
40.0000 meq | EXTENDED_RELEASE_TABLET | Freq: Once | ORAL | Status: AC
  Administered 2021-11-22: 40 meq via ORAL
  Filled 2021-11-22: qty 2

## 2021-11-22 NOTE — ED Triage Notes (Signed)
Sent b y Dr Durene Cal office due to SOB, tachy and hypertension.  Patient has prolonged exhalation and expiratory wheezing.  Patient has been prednisone with no improvement.  ?

## 2021-11-22 NOTE — Assessment & Plan Note (Addendum)
No chest pain presently ?Personally reviewed EKG--sinus tachycardia, nonspecific ST changes ?Continue ASA 81 mg daily ?

## 2021-11-22 NOTE — Assessment & Plan Note (Addendum)
Continue Pulmicort Continue Brovana Continue DuoNebs Continue IV Solu-Medrol Continue Mucinex Start hypertonic saline to help pt expectorate Add doxy Start [employer]

## 2021-11-22 NOTE — Assessment & Plan Note (Signed)
Continue statin. 

## 2021-11-22 NOTE — Assessment & Plan Note (Addendum)
Replete ?Check magnesium 2.0 ?

## 2021-11-22 NOTE — ED Provider Notes (Signed)
[facility] EMERGENCY DEPARTMENT Provider Note   CSN: [id number] Arrival date & time: 11/22/21  1152     History  Chief Complaint  Patient presents with   Respiratory Distress    Jeremy [name] is a 86 y.o. male.  Patient with hx copd, htn, presents with sob. Pt indicates is chronically sob, but worse in past few days. +non prod cough. No sore throat. No fever or chills. No chest pain or discomfort. No leg pain or swelling. Compliant w home meds. On prednisone. Uses albuterol. Former smoker. ?orthopnea, slept in chair last night, but states sometimes when coughing and with copd issues will sleep in chair for that as well.   The history is provided by the patient, the spouse and medical records.      Home Medications Prior to Admission medications   Medication Sig Start Date End Date Taking? Authorizing Provider  acetaminophen (TYLENOL) 500 MG tablet Take 500-1,000 mg by mouth daily as needed for moderate pain or headache.    [provider]  albuterol (PROVENTIL HFA;VENTOLIN HFA) 108 (90 Base) MCG/ACT inhaler Inhale 1-2 puffs into the lungs every 6 (six) hours as needed for wheezing or shortness of breath.    [provider]  aspirin 81 MG tablet Take 1 tablet (81 mg total) by mouth every evening. 05/09/21   Regalado, Belkys A, MD  BREZTRI AEROSPHERE 160-9-4.8 MCG/ACT AERO Inhale 2 puffs into the lungs 2 (two) times daily. 07/26/21   [provider]  Budeson-Glycopyrrol-Formoterol (BREZTRI AEROSPHERE) 160-9-4.8 MCG/ACT AERO Inhale 2 puffs into the lungs 2 (two) times daily. 10/13/21   Tanda Rockers, MD  Cyanocobalamin (B-12 COMPLIANCE INJECTION IJ) Inject 1 Dose as directed every 30 (thirty) days.    [provider]  diphenhydrAMINE (BENADRYL) 25 MG tablet Take 12.5 mg by mouth at bedtime as needed for sleep.    [provider]  levocetirizine (XYZAL) 5 MG tablet Take 5 mg by mouth daily. 07/26/21   [provider]  lisinopril  (ZESTRIL) 5 MG tablet TAKE 1 TABLET BY MOUTH DAILY 11/19/21   Arnoldo Lenis, MD  Menthol, Topical Analgesic, (ICY HOT EX) Apply 1 application topically daily as needed (pain).    [provider]  nitroGLYCERIN (NITROSTAT) 0.4 MG SL tablet PLACE 1 TABLET UNDER THE TONGUE EVERY 5 MINUTES FOR 3 DOSES AS NEEDED FOR CHEST PAIN Patient taking differently: Place 0.4 mg under the tongue every 5 (five) minutes as needed for chest pain. 08/11/16   Arnoldo Lenis, MD  pantoprazole (PROTONIX) 40 MG tablet TAKE 1 TABLET(40 MG) BY MOUTH TWICE DAILY 10/20/21   Branch, Alphonse Guild, MD  predniSONE (DELTASONE) 10 MG tablet Take 4 tablets (40 mg total) by mouth daily with breakfast for 2 days, THEN 2 tablets (20 mg total) daily with breakfast for 2 days, THEN 1 tablet (10 mg total) daily with breakfast for 2 days. 11/19/21 11/25/21  Tanda Rockers, MD  simvastatin (ZOCOR) 40 MG tablet TAKE 1 TABLET(40 MG) BY MOUTH EVERY EVENING Patient taking differently: Take 40 mg by mouth every evening. 01/22/21   Branch, Alphonse Guild, MD  vitamin B-12 (CYANOCOBALAMIN) 100 MCG tablet Take 100 mcg by mouth daily.    [provider]      Allergies    Patient has no known allergies.    Review of Systems   Review of Systems  Constitutional:  Negative for fever.  HENT:  Negative for sore throat.   Eyes:  Negative for redness.  Respiratory:  Positive for cough, shortness of breath and wheezing.   Cardiovascular:  Negative for chest pain, palpitations and leg swelling.  Gastrointestinal:  Negative for abdominal pain and vomiting.  Genitourinary:  Negative for flank pain.  Musculoskeletal:  Negative for back pain and neck pain.  Skin:  Negative for rash.  Neurological:  Negative for headaches.  Hematological:  Does not bruise/bleed easily.  Psychiatric/Behavioral:  Negative for confusion.    Physical Exam Updated Vital Signs BP (!) 192/116 (BP Location: Left Arm)    Pulse (!) 112    Temp 98.2 F (36.8 C)  (Oral)    Resp (!) 26    Ht 1.829 m (6')    Wt 84.8 kg    SpO2 96%    BMI 25.36 kg/m  Physical Exam Vitals and nursing note reviewed.  Constitutional:      Appearance: Normal appearance. He is well-developed.  HENT:     Head: Atraumatic.     Nose: Nose normal.     Mouth/Throat:     Mouth: Mucous membranes are moist.     Pharynx: Oropharynx is clear.  Eyes:     General: No scleral icterus.    Conjunctiva/sclera: Conjunctivae normal.  Neck:     Trachea: No tracheal deviation.  Cardiovascular:     Rate and Rhythm: Regular rhythm. Tachycardia present.     Pulses: Normal pulses.     Heart sounds: Normal heart sounds. No murmur heard.   No friction rub. No gallop.  Pulmonary:     Effort: Respiratory distress present. No accessory muscle usage.     Breath sounds: Wheezing present.     Comments: Decreased air movement and wheezing bil.  Abdominal:     General: Bowel sounds are normal. There is no distension.     Palpations: Abdomen is soft.     Tenderness: There is no abdominal tenderness.  Genitourinary:    Comments: No cva tenderness. Musculoskeletal:        General: No swelling or tenderness.     Cervical back: Normal range of motion and neck supple. No rigidity.     Right lower leg: No edema.     Left lower leg: No edema.  Skin:    General: Skin is warm and dry.     Findings: No rash.  Neurological:     Mental Status: He is alert.     Comments: Alert, speech clear.   Psychiatric:        Mood and Affect: Mood normal.    ED Results / Procedures / Treatments   Labs (all labs ordered are listed, but only abnormal results are displayed) Results for orders placed or performed during the hospital encounter of 11/22/21  Resp Panel by RT-PCR (Flu A&B, Covid) Nasopharyngeal Swab   Specimen: Nasopharyngeal Swab; Nasopharyngeal(NP) swabs in vial transport medium  Result Value Ref Range   SARS Coronavirus 2 by RT PCR NEGATIVE NEGATIVE   Influenza A by PCR NEGATIVE NEGATIVE    Influenza B by PCR NEGATIVE NEGATIVE  CBC  Result Value Ref Range   WBC 6.3 4.0 - 10.5 K/uL   RBC 5.15 4.22 - 5.81 MIL/uL   Hemoglobin 14.6 13.0 - 17.0 g/dL   HCT 45.4 39.0 - 52.0 %   MCV 88.2 80.0 - 100.0 fL   MCH 28.3 26.0 - 34.0 pg   MCHC 32.2 30.0 - 36.0 g/dL   RDW 15.3 11.5 - 15.5 %   Platelets 194 150 - 400 K/uL   nRBC  0.0 0.0 - 0.2 %  Comprehensive metabolic panel  Result Value Ref Range   Sodium 143 135 - 145 mmol/L   Potassium 3.1 (L) 3.5 - 5.1 mmol/L   Chloride 106 98 - 111 mmol/L   CO2 27 22 - 32 mmol/L   Glucose, Bld 137 (H) 70 - 99 mg/dL   BUN 19 8 - 23 mg/dL   Creatinine, Ser 0.99 0.61 - 1.24 mg/dL   Calcium 8.8 (L) 8.9 - 10.3 mg/dL   Total Protein 7.1 6.5 - 8.1 g/dL   Albumin 4.0 3.5 - 5.0 g/dL   AST 18 15 - 41 U/L   ALT 14 0 - 44 U/L   Alkaline Phosphatase 73 38 - 126 U/L   Total Bilirubin 0.6 0.3 - 1.2 mg/dL   GFR, Estimated >60 >60 mL/min   Anion gap 10 5 - 15  Brain natriuretic peptide  Result Value Ref Range   B Natriuretic Peptide 45.0 0.0 - 100.0 pg/mL      EKG EKG Interpretation  Date/Time:  Saturday November 22 2021 11:57:49 EST Ventricular Rate:  118 PR Interval:  170 QRS Duration: 100 QT Interval:  327 QTC Calculation: 459 R Axis:   9 Text Interpretation: Sinus tachycardia Non-specific ST-t changes Confirmed by Lajean Saver [phone]) on 11/22/2021 12:51:30 PM  Radiology DG Chest Port 1 View  Result Date: 11/22/2021 CLINICAL DATA:  Shortness of breath and hypertension EXAM: PORTABLE CHEST 1 VIEW COMPARISON:  09/30/2021 FINDINGS: Emphysema noted. Atherosclerotic calcification of the aortic arch. Thoracic spondylosis. Ill-defined nodularity in both mid lungs. Small pulmonary nodules cannot be readily excluded. This could be inflammatory but the ligament pulmonary nodule cannot be excluded and CT chest is recommended for further characterization. No blunting of the costophrenic angles. Heart size within normal limits. IMPRESSION: 1. Subtle mid  lung nodularity bilaterally, cannot exclude inflammatory or neoplastic pulmonary nodules. Chest CT is recommended for further characterization. 2. Aortic Atherosclerosis (ICD10-I70.0) and Emphysema (ICD10-J43.9). 3. Thoracic spondylosis. Electronically Signed   By: Van Clines M.D.   On: 11/22/2021 12:37    Procedures Procedures    Medications Ordered in ED Medications  albuterol (PROVENTIL) (2.5 MG/3ML) 0.083% nebulizer solution 5 mg (has no administration in time range)  ipratropium (ATROVENT) nebulizer solution 0.5 mg (has no administration in time range)  methylPREDNISolone sodium succinate (SOLU-MEDROL) 125 mg/2 mL injection 125 mg (has no administration in time range)  nitroGLYCERIN (NITROSTAT) SL tablet 0.4 mg (has no administration in time range)    ED Course/ Medical Decision Making/ A&P                           Medical Decision Making Problems Addressed: COPD exacerbation ([place]): acute illness or injury with systemic symptoms that poses a threat to life or bodily functions Essential hypertension: chronic illness or injury  Amount and/or Complexity of Data Reviewed Independent Historian: spouse    Details: additional hx External Data Reviewed: notes. Labs: ordered. Decision-making details documented in ED Course. Radiology: ordered and independent interpretation performed. Decision-making details documented in ED Course. ECG/medicine tests: ordered and independent interpretation performed. Decision-making details documented in ED Course. Discussion of management or test interpretation with external provider(s): Hospitalist - discussed pt, labs, imaging.   Risk Prescription drug management. Decision regarding hospitalization.   Iv ns. Continuous pulse ox and cardiac monitoring. Labs ordered/sent. Imaging ordered.   Reviewed nursing notes and prior charts for additional history. External reports reviewed. Additional history from: spouse.  Bp is very high  ?contributing to dyspnea?edema - sl ntg.   Cardiac monitor: sinus rhythm, rate 110.   Labs reviewed/interpreted by me - k mildly low. Kcl po. Wbc normal.   Xrays reviewed/interpreted by me - no def pna.   Albuterol and atrovent neb tx. Solumedrol iv.   Recheck, persistent wheezing. Additional albuterol and atrovent tx.   After several nebs, air movement is improved from prior.  Pt w persistent dyspnea/wheezing. Will admit to hospitalists re copd exacerbation.   Hospitalists consulted for admission.          Final Clinical Impression(s) / ED Diagnoses Final diagnoses:  None    Rx / DC Orders ED Discharge Orders     None         Lajean Saver, MD 11/22/21 1431

## 2021-11-22 NOTE — Hospital Course (Addendum)
86 year old male with a history of coronary disease, COPD, hyperlipidemia, hypertension, GI bleed presenting with 3-day history of shortness of breath that began on 11/19/2021.  The patient states that he had been working outside on 11/17/2021 and 11/18/2021 turned down a house and thought that he may have been exposed to outside allergens.  He has experienced increasing nonproductive cough, but it became yellowish on 11/22/2021.  He denies any hemoptysis, fever, chills, chest pain, nausea, vomiting, diarrhea, abdominal pain.  He went to see his PCP on the morning of 11/22/2021.  He was noted to be tachypneic and tachycardic and wheezing.  Subsequently, he was sent to the emergency department for further evaluation and treatment.  The patient states that he had to sleep in a recliner on the evening of 11/21/2021, but states that he normally sleeps on a flat bed and only has to be sitting up when his COPD acts up.  He denies any worsening lower extremity edema or increasing abdominal girth. Notably, the patient contacted Dr. Gustavus Bryant office on 11/19/2021 and was started on a prednisone taper, but this did not seem to help. In the ED, the patient was afebrile and hemodynamically stable.  He was tachycardic up to 120.  Oxygen saturation was 92% on room air.  Chest x-ray was negative for any frank infiltrate but showed subtle bilateral midlung nodularity.  EKG shows sinus rhythm with nonspecific ST changes.  BNP was 45.0.  The patient was given bronchodilators, Solu-Medrol, but the patient continued to have shortness of breath and wheezing.  The patient was admitted for further evaluation and treatment for the COPD exacerbation.  3/12--breathing a little better but remains sob with minimal exertion.  Denies f/c, cp, n/v/d, abd pain.  Complains of not being able to expectorate sputum;  94% on RA

## 2021-11-22 NOTE — Assessment & Plan Note (Signed)
Continue lisinopril

## 2021-11-22 NOTE — Assessment & Plan Note (Signed)
Continue pantoprazole twice daily ?

## 2021-11-22 NOTE — H&P (Signed)
History and Physical    Patient: Jeremy [name] DOB: December 31, 1935 DOA: 11/22/2021 DOS: the patient was seen and examined on 11/22/2021 PCP: Celene Squibb, MD  Patient coming from: Home  Chief Complaint:  Chief Complaint  Patient presents with   Respiratory Distress   HPI: Jeremy [name] is a 86 year old male with a history of coronary disease, COPD, hyperlipidemia, hypertension, GI bleed presenting with 3-day history of shortness of breath that began on 11/19/2021.  The patient states that he had been working outside on 11/17/2021 and 11/18/2021 turned down a house and thought that he may have been exposed to outside allergens.  He has experienced increasing nonproductive cough, but it became yellowish on 11/22/2021.  He denies any hemoptysis, fever, chills, chest pain, nausea, vomiting, diarrhea, abdominal pain.  He went to see his PCP on the morning of 11/22/2021.  He was noted to be tachypneic and tachycardic and wheezing.  Subsequently, he was sent to the emergency department for further evaluation and treatment.  The patient states that he had to sleep in a recliner on the evening of 11/21/2021, but states that he normally sleeps on a flat bed and only has to be sitting up when his COPD acts up.  He denies any worsening lower extremity edema or increasing abdominal girth. Notably, the patient contacted Dr. Gustavus Bryant office on 11/19/2021 and was started on a prednisone taper, but this did not seem to help. In the ED, the patient was afebrile and hemodynamically stable.  He was tachycardic up to 120.  Oxygen saturation was 92% on room air.  Chest x-ray was negative for any frank infiltrate but showed subtle bilateral midlung nodularity.  EKG shows sinus rhythm with nonspecific ST changes.  BNP was 45.0.  The patient was given bronchodilators, Solu-Medrol, but the patient continued to have shortness of breath and wheezing.  The patient was admitted for further evaluation and treatment for the COPD  exacerbation.   Review of Systems: As mentioned in the history of present illness. All other systems reviewed and are negative. Past Medical History:  Diagnosis Date   Abnormal CXR 4/20011   Chest  CT mode/serere COPD ( no mediastinal abnormality)   CAD (coronary artery disease) 2005   anterior MI with stent to the LAD in 1991 / stent 1996 / nuclear 2005, no ischemia   Carotid bruit    Doppler, June, 2013, 0-39% bilateral   COPD (chronic obstructive pulmonary disease) ([place])    Diverticula of colon    Ejection fraction    55-60%, echo, April, 2011, could not estimate RV pressure   Esophagitis    HOH (hard of hearing)    Hyperlipidemia    Hypertension    Myocardial infarction ([place])    x2   Shortness of breath    with exertion   Sleep apnea    Stop Bang score of 5   Tobacco abuse    Past Surgical History:  Procedure Laterality Date   APPENDECTOMY     BIOPSY  11/17/2017   Procedure: BIOPSY;  Surgeon: Rogene Houston, MD;  Location: [facility];  Service: Endoscopy;;  gastric   CARDIAC CATHETERIZATION     CATARACT EXTRACTION Left    CATARACT EXTRACTION W/PHACO Right 01/22/2014   Procedure: CATARACT EXTRACTION PHACO AND INTRAOCULAR LENS PLACEMENT ([place]);  Surgeon: Tonny Branch, MD;  Location: [facility];  Service: Ophthalmology;  Laterality: Right;  CDE 7.46   CHOLECYSTECTOMY     8 years ago [facility]  COLONOSCOPY  10/05/2012   Dr. Laural Golden: multiple diverticula at sigmoid colon, multiple polyps (tubular adenomas), small external hemorrhoids   COLONOSCOPY N/A 11/18/2017   Procedure: COLONOSCOPY;  Surgeon: Rogene Houston, MD;  Location: [facility];  Service: Endoscopy;  Laterality: N/A;   COLONOSCOPY WITH PROPOFOL N/A 05/07/2021   Procedure: COLONOSCOPY WITH PROPOFOL;  Surgeon: Rogene Houston, MD;  [facility]: [facility];  Service: Endoscopy;  Laterality: N/A;   CYST REMOVAL HAND     right wrist   ESOPHAGEAL DILATION N/A 05/23/2015   Procedure: ESOPHAGEAL DILATION;  Surgeon: Daneil Dolin, MD;  [facility]: [facility];  Service: Endoscopy;  Laterality: N/A;   ESOPHAGEAL DILATION N/A 11/17/2017   Procedure: ESOPHAGEAL DILATION;  Surgeon: Rogene Houston, MD;  [facility]: [facility];  Service: Endoscopy;  Laterality: N/A;   ESOPHAGEAL DILATION N/A 05/07/2021   Procedure: ESOPHAGEAL DILATION;  Surgeon: Rogene Houston, MD;  [facility]: [facility];  Service: Endoscopy;  Laterality: N/A;   ESOPHAGOGASTRODUODENOSCOPY N/A 05/23/2015   Dr. Gala Romney: Ulcerative/ erosive reflux esophagitis with peptic stricture formation. Status post dilation as described. Hiatal hernia. Abnormal gastric mucosa of uncertain significance status post gastric biospy. +H.pylori   ESOPHAGOGASTRODUODENOSCOPY N/A 11/17/2017   Procedure: ESOPHAGOGASTRODUODENOSCOPY (EGD);  Surgeon: Rogene Houston, MD;  [facility];  Service: Endoscopy;  Laterality: N/A;   ESOPHAGOGASTRODUODENOSCOPY (EGD) WITH PROPOFOL N/A 05/07/2021   Procedure: ESOPHAGOGASTRODUODENOSCOPY (EGD) WITH PROPOFOL;  Surgeon: Rogene Houston, MD;  Location: [facility];  Service: Endoscopy;  Laterality: N/A;   PERCUTANEOUS STENT INTERVENTION     SKIN CANCER EXCISION  2015   ear & neck   TONSILLECTOMY     Social History:  reports that he quit smoking about 33 years ago. His smoking use included cigarettes. He started smoking about 38 years ago. He has a 36.00 pack-year smoking history. His smokeless tobacco use includes chew. He reports that he does not drink alcohol and does not use drugs.  No Known Allergies  Family History  Problem Relation Age of Onset   Heart disease Mother    Heart disease Father    Heart disease Brother    Stomach cancer Brother    Colon cancer Neg Hx     Prior to Admission medications   Medication Sig Start Date End Date Taking? Authorizing Provider  acetaminophen (TYLENOL) 500 MG tablet Take 500-1,000 mg by mouth daily as needed for moderate pain or headache.   Yes [provider]  albuterol  (PROVENTIL HFA;VENTOLIN HFA) 108 (90 Base) MCG/ACT inhaler Inhale 1-2 puffs into the lungs every 6 (six) hours as needed for wheezing or shortness of breath.   Yes [provider]  aspirin 81 MG tablet Take 1 tablet (81 mg total) by mouth every evening. 05/09/21  Yes Regalado, Belkys A, MD  Budeson-Glycopyrrol-Formoterol (BREZTRI AEROSPHERE) 160-9-4.8 MCG/ACT AERO Inhale 2 puffs into the lungs 2 (two) times daily. 10/13/21  Yes Tanda Rockers, MD  Cholecalciferol (VITAMIN D3) 50 MCG (2000 UT) CHEW Chew 1 tablet by mouth daily.   Yes [provider]  diphenhydrAMINE (BENADRYL) 25 MG tablet Take 12.5 mg by mouth at bedtime as needed for sleep.   Yes [provider]  ipratropium-albuterol (DUONEB) 0.5-2.5 (3) MG/3ML SOLN Take 3 mLs by nebulization every 4 (four) hours as needed (Shortness of breath). 09/18/21  Yes [provider]  lisinopril (ZESTRIL) 5 MG tablet TAKE 1 TABLET BY MOUTH DAILY 11/19/21  Yes Branch, Alphonse Guild, MD  loratadine (CLARITIN) 10 MG tablet Take 10 mg by mouth daily.   Yes [provider]  Menthol, Topical Analgesic, (ICY HOT EX) Apply 1 application topically daily as needed (pain).   Yes [provider]  nitroGLYCERIN (NITROSTAT) 0.4 MG SL tablet PLACE 1 TABLET UNDER THE TONGUE EVERY 5 MINUTES FOR 3 DOSES AS NEEDED FOR CHEST PAIN Patient taking differently: Place 0.4 mg under the tongue every 5 (five) minutes as needed for chest pain. 08/11/16  Yes Branch, Alphonse Guild, MD  pantoprazole (PROTONIX) 40 MG tablet TAKE 1 TABLET(40 MG) BY MOUTH TWICE DAILY Patient taking differently: Take 40 mg by mouth 2 (two) times daily. 10/20/21  Yes Branch, Alphonse Guild, MD  predniSONE (DELTASONE) 10 MG tablet Take 4 tablets (40 mg total) by mouth daily with breakfast for 2 days, THEN 2 tablets (20 mg total) daily with breakfast for 2 days, THEN 1 tablet (10 mg total) daily with breakfast for 2 days. 11/19/21 11/25/21 Yes Tanda Rockers, MD  simvastatin  (ZOCOR) 40 MG tablet TAKE 1 TABLET(40 MG) BY MOUTH EVERY EVENING Patient taking differently: Take 40 mg by mouth every evening. 01/22/21  Yes Branch, Alphonse Guild, MD  vitamin B-12 (CYANOCOBALAMIN) 100 MCG tablet Take 100 mcg by mouth daily.   Yes [provider]    Physical Exam: Vitals:   11/22/21 1400 11/22/21 1430 11/22/21 1515 11/22/21 1523  BP: 136/85 (!) 151/85  (!) 161/92  Pulse: (!) 112 (!) 107  (!) 108  Resp: (!) 25 (!) 24  20  Temp:   98.3 F (36.8 C) (!) 97 F (36.1 C)  TempSrc:   Oral Axillary  SpO2: 92% 92%    Weight:      Height:       GENERAL:  A&O x 3, NAD, well developed, cooperative, follows commands HEENT: [place]/AT, No thrush, No icterus, No oral ulcers Neck:  No neck mass, No meningismus, soft, supple CV: RRR, no S3, no S4, no rub, no JVD Lungs:  diminished BS.  Bilateral wheeze.  Bibasilar rales L>R Abd: soft/NT +BS, nondistended Ext: trace LE edema, no lymphangitis, no cyanosis, no rashes Neuro:  CN II-XII intact, strength 4/5 in RUE, RLE, strength 4/5 LUE, LLE; sensation intact bilateral; no dysmetria; babinski equivocal  Data Reviewed: Results reviewed in the history  Assessment and Plan: * COPD exacerbation (HCC) Start Pulmicort Start Brovana Continue DuoNebs Continue IV Solu-Medrol Start Mucinex If no improvement, and remains unable to expectorate, consider hypertonic saline  Hypokalemia Replete Check magnesium  Reflux esophagitis Continue pantoprazole twice daily  CAD (coronary artery disease) No chest pain presently Personally reviewed EKG--sinus tachycardia, nonspecific ST changes Continue ASA 81 mg daily  Hyperlipidemia Continue statin  Essential hypertension Continue lisinopril      Advance Care Planning:   Code Status: Prior FULL CODE  Consults: none  Family Communication: spouse updated 3/11  Severity of Illness: The appropriate patient status for this patient is INPATIENT. Inpatient status is judged to be  reasonable and necessary in order to provide the required intensity of service to ensure the patient's safety. The patient's presenting symptoms, physical exam findings, and initial radiographic and laboratory data in the context of their chronic comorbidities is felt to place them at high risk for further clinical deterioration. Furthermore, it is not anticipated that the patient will be medically stable for discharge from the hospital within 2 midnights of admission.   * I certify that at the point of admission it is my clinical judgment that the  patient will require inpatient hospital care spanning beyond 2 midnights from the point of admission due to high intensity of service, high risk for further deterioration and high frequency of surveillance required.*  Author: Orson Eva, MD 11/22/2021 3:27 PM  For on call review [email or web address].

## 2021-11-23 DIAGNOSIS — E782 Mixed hyperlipidemia: Secondary | ICD-10-CM | POA: Diagnosis not present

## 2021-11-23 DIAGNOSIS — I10 Essential (primary) hypertension: Secondary | ICD-10-CM | POA: Diagnosis not present

## 2021-11-23 DIAGNOSIS — E876 Hypokalemia: Secondary | ICD-10-CM | POA: Diagnosis not present

## 2021-11-23 DIAGNOSIS — J441 Chronic obstructive pulmonary disease with (acute) exacerbation: Secondary | ICD-10-CM | POA: Diagnosis not present

## 2021-11-23 LAB — BASIC METABOLIC PANEL WITH GFR
Anion gap: 8 (ref 5–15)
BUN: 18 mg/dL (ref 8–23)
CO2: 27 mmol/L (ref 22–32)
Calcium: 8.6 mg/dL — ABNORMAL LOW (ref 8.9–10.3)
Chloride: 106 mmol/L (ref 98–111)
Creatinine, Ser: 0.91 mg/dL (ref 0.61–1.24)
GFR, Estimated: >60 mL/min
Glucose, Bld: 141 mg/dL — ABNORMAL HIGH (ref 70–99)
Potassium: 4.2 mmol/L (ref 3.5–5.1)
Sodium: 141 mmol/L (ref 135–145)

## 2021-11-23 LAB — MAGNESIUM: Magnesium: 2 mg/dL (ref 1.7–2.4)

## 2021-11-23 MED ORDER — DOXYCYCLINE HYCLATE 100 MG PO TABS
100.0000 mg | ORAL_TABLET | Freq: Two times a day (BID) | ORAL | Status: DC
  Administered 2021-11-23 – 2021-11-27 (×9): 100 mg via ORAL
  Filled 2021-11-23 (×9): qty 1

## 2021-11-23 MED ORDER — SODIUM CHLORIDE 3 % IN NEBU
4.0000 mL | INHALATION_SOLUTION | Freq: Two times a day (BID) | RESPIRATORY_TRACT | Status: DC
  Administered 2021-11-23 – 2021-11-25 (×5): 4 mL via RESPIRATORY_TRACT
  Filled 2021-11-23 (×5): qty 4

## 2021-11-23 NOTE — Progress Notes (Signed)
PROGRESS NOTE  Jeremy [name] VVO:[id number] DOB: [birth year] DOA: 11/22/2021 PCP: Celene Squibb, MD  Brief History:  86 year old male with a history of coronary disease, COPD, hyperlipidemia, hypertension, GI bleed presenting with 3-day history of shortness of breath that began on 11/19/2021.  The patient states that he had been working outside on 11/17/2021 and 11/18/2021 turned down a house and thought that he may have been exposed to outside allergens.  He has experienced increasing nonproductive cough, but it became yellowish on 11/22/2021.  He denies any hemoptysis, fever, chills, chest pain, nausea, vomiting, diarrhea, abdominal pain.  He went to see his PCP on the morning of 11/22/2021.  He was noted to be tachypneic and tachycardic and wheezing.  Subsequently, he was sent to the emergency department for further evaluation and treatment.  The patient states that he had to sleep in a recliner on the evening of 11/21/2021, but states that he normally sleeps on a flat bed and only has to be sitting up when his COPD acts up.  He denies any worsening lower extremity edema or increasing abdominal girth. Notably, the patient contacted Dr. Gustavus Bryant office on 11/19/2021 and was started on a prednisone taper, but this did not seem to help. In the ED, the patient was afebrile and hemodynamically stable.  He was tachycardic up to 120.  Oxygen saturation was 92% on room air.  Chest x-ray was negative for any frank infiltrate but showed subtle bilateral midlung nodularity.  EKG shows sinus rhythm with nonspecific ST changes.  BNP was 45.0.  The patient was given bronchodilators, Solu-Medrol, but the patient continued to have shortness of breath and wheezing.  The patient was admitted for further evaluation and treatment for the COPD exacerbation.  3/12--breathing a little better but remains sob with minimal exertion.  Denies f/c, cp, n/v/d, abd pain.  Complains of not being able to expectorate sputum;  94% on RA     Assessment/Plan:   Principal Problem:   COPD exacerbation (HCC) Active Problems:   Essential hypertension   Hyperlipidemia   CAD (coronary artery disease)   Reflux esophagitis   Hypokalemia  Assessment and Plan: * COPD exacerbation (HCC) Continue Pulmicort Continue Brovana Continue DuoNebs Continue IV Solu-Medrol ContinueMucinex Start hypertonic saline to help pt expectorate Add doxy  Hypokalemia Replete Check magnesium 2.0  Reflux esophagitis Continue pantoprazole twice daily  CAD (coronary artery disease) No chest pain presently Personally reviewed EKG--sinus tachycardia, nonspecific ST changes Continue ASA 81 mg daily  Hyperlipidemia Continue statin  Essential hypertension Continue lisinopril         Status is: Inpatient Remains inpatient appropriate because: severity of illness requiring IV steroids    Family Communication:   spouse updated at bedside 3/12  Consultants:  none  Code Status:  FULL  DVT Prophylaxis:  [place] Lovenox   Procedures: As Listed in Progress Note Above  Antibiotics: Doxy 3/12>>     Subjective: Breathing a little better, but complains of dyspnea on exertion.  Has dry cough.  Cannot expectorate. Denies f/c, n/v/d, abd pain  Objective: Vitals:   11/23/21 0616 11/23/21 0726 11/23/21 0731 11/23/21 0744  BP: 125/73     Pulse: 92     Resp: 20     Temp: 98.2 F (36.8 C)     TempSrc: Oral     SpO2: 90% 92% 92% 92%  Weight:      Height:        Intake/Output Summary (  Last 24 hours) at 11/23/2021 1237 Last data filed at 11/23/2021 0900 Gross per 24 hour  Intake 720 ml  Output --  Net 720 ml   Weight change:  Exam:  General:  Pt is alert, follows commands appropriately, not in acute distress HEENT: No icterus, No thrush, No neck mass, [place]/AT Cardiovascular: RRR, S1/S2, no rubs, no gallops Respiratory: bibasilar rales.  Bilateral exp wheeze Abdomen: Soft/+BS, non tender, non distended, no  guarding Extremities: No edema, No lymphangitis, No petechiae, No rashes, no synovitis   Data Reviewed: I have personally reviewed following labs and imaging studies Basic Metabolic Panel: Recent Labs  Lab 11/22/21 1202 11/23/21 0408  NA 143 141  K 3.1* 4.2  CL 106 106  CO2 27 27  GLUCOSE 137* 141*  BUN 19 18  CREATININE 0.99 0.91  CALCIUM 8.8* 8.6*  MG  --  2.0   Liver Function Tests: Recent Labs  Lab 11/22/21 1202  AST 18  ALT 14  ALKPHOS 73  BILITOT 0.6  PROT 7.1  ALBUMIN 4.0   No results for input(s): LIPASE, AMYLASE in the last 168 hours. No results for input(s): AMMONIA in the last 168 hours. Coagulation Profile: No results for input(s): INR, PROTIME in the last 168 hours. CBC: Recent Labs  Lab 11/22/21 1202  WBC 6.3  HGB 14.6  HCT 45.4  MCV 88.2  PLT 194   Cardiac Enzymes: No results for input(s): CKTOTAL, CKMB, CKMBINDEX, TROPONINI in the last 168 hours. BNP: Invalid input(s): POCBNP CBG: No results for input(s): GLUCAP in the last 168 hours. HbA1C: No results for input(s): HGBA1C in the last 72 hours. Urine analysis:    Component Value Date/Time   COLORURINE YELLOW 05/06/2021 0919   APPEARANCEUR CLEAR 05/06/2021 0919   LABSPEC 1.025 05/06/2021 0919   PHURINE 5.0 05/06/2021 0919   GLUCOSEU NEGATIVE 05/06/2021 0919   HGBUR NEGATIVE 05/06/2021 0919   BILIRUBINUR NEGATIVE 05/06/2021 0919   KETONESUR NEGATIVE 05/06/2021 0919   PROTEINUR NEGATIVE 05/06/2021 0919   UROBILINOGEN 0.2 10/22/2013 0820   NITRITE NEGATIVE 05/06/2021 0919   LEUKOCYTESUR NEGATIVE 05/06/2021 0919   Sepsis Labs: '@LABRCNTIP'$ (procalcitonin:4,lacticidven:4) ) Recent Results (from the past 240 hour(s))  Resp Panel by RT-PCR (Flu A&B, Covid) Nasopharyngeal Swab     Status: None   Collection Time: 11/22/21 12:02 PM   Specimen: Nasopharyngeal Swab; Nasopharyngeal(NP) swabs in vial transport medium  Result Value Ref Range Status   SARS Coronavirus 2 by RT PCR NEGATIVE  NEGATIVE Final    Comment: (NOTE) SARS-CoV-2 target nucleic acids are NOT DETECTED.  The SARS-CoV-2 RNA is generally detectable in upper respiratory specimens during the acute phase of infection. The lowest concentration of SARS-CoV-2 viral copies this assay can detect is 138 copies/mL. A negative result does not preclude SARS-Cov-2 infection and should not be used as the sole basis for treatment or other patient management decisions. A negative result may occur with  improper specimen collection/handling, submission of specimen other than nasopharyngeal swab, presence of viral mutation(s) within the areas targeted by this assay, and inadequate number of viral copies(<138 copies/mL). A negative result must be combined with clinical observations, patient history, and epidemiological information. The expected result is Negative.  Fact Sheet for Patients:  EntrepreneurPulse.com.au  Fact Sheet for Healthcare Providers:  IncredibleEmployment.be  This test is no t yet approved or cleared by the [place] FDA and  has been authorized for detection and/or diagnosis of SARS-CoV-2 by FDA under an Emergency Use Authorization (EUA). This EUA will remain  in effect (meaning this test can be used) for the duration of the COVID-19 declaration under Section 564(b)(1) of the Act, 21 U.S.C.section 360bbb-3(b)(1), unless the authorization is terminated  or revoked sooner.       Influenza A by PCR NEGATIVE NEGATIVE Final   Influenza B by PCR NEGATIVE NEGATIVE Final    Comment: (NOTE) The Xpert Xpress SARS-CoV-2/FLU/RSV plus assay is intended as an aid in the diagnosis of influenza from Nasopharyngeal swab specimens and should not be used as a sole basis for treatment. Nasal washings and aspirates are unacceptable for Xpert Xpress SARS-CoV-2/FLU/RSV testing.  Fact Sheet for Patients: EntrepreneurPulse.com.au  Fact Sheet for Healthcare  Providers: IncredibleEmployment.be  This test is not yet approved or cleared by the [place] FDA and has been authorized for detection and/or diagnosis of SARS-CoV-2 by FDA under an Emergency Use Authorization (EUA). This EUA will remain in effect (meaning this test can be used) for the duration of the COVID-19 declaration under Section 564(b)(1) of the Act, 21 U.S.C. section 360bbb-3(b)(1), unless the authorization is terminated or revoked.  Performed at [facility], [address], [place], [place] [address]      Scheduled Meds:  arformoterol  15 mcg Nebulization BID   aspirin EC  81 mg Oral QPM   budesonide (PULMICORT) nebulizer solution  0.5 mg Nebulization BID   cholecalciferol  2,000 Units Oral Daily   doxycycline  100 mg Oral Q12H   enoxaparin (LOVENOX) injection  40 mg Subcutaneous Q24H   guaiFENesin  1,200 mg Oral BID   ipratropium  0.5 mg Nebulization Q6H   levalbuterol  0.63 mg Nebulization Q6H   lisinopril  5 mg Oral Daily   loratadine  10 mg Oral Daily   methylPREDNISolone (SOLU-MEDROL) injection  120 mg Intravenous Daily   pantoprazole  40 mg Oral BID   simvastatin  40 mg Oral QPM   sodium chloride HYPERTONIC  4 mL Nebulization BID   vitamin B-12  100 mcg Oral Daily   Continuous Infusions:  Procedures/Studies: DG Chest Port 1 View  Result Date: 11/22/2021 CLINICAL DATA:  Shortness of breath and hypertension EXAM: PORTABLE CHEST 1 VIEW COMPARISON:  09/30/2021 FINDINGS: Emphysema noted. Atherosclerotic calcification of the aortic arch. Thoracic spondylosis. Ill-defined nodularity in both mid lungs. Small pulmonary nodules cannot be readily excluded. This could be inflammatory but the ligament pulmonary nodule cannot be excluded and CT chest is recommended for further characterization. No blunting of the costophrenic angles. Heart size within normal limits. IMPRESSION: 1. Subtle mid lung nodularity bilaterally, cannot exclude inflammatory or  neoplastic pulmonary nodules. Chest CT is recommended for further characterization. 2. Aortic Atherosclerosis (ICD10-I70.0) and Emphysema (ICD10-J43.9). 3. Thoracic spondylosis. Electronically Signed   By: Van Clines M.D.   On: 11/22/2021 12:37    Orson Eva, DO  [facility]  If 7PM-7AM, please contact night-coverage [email or web address] Password [facility] 11/23/2021, 12:37 PM   LOS: 1 day

## 2021-11-23 NOTE — TOC Initial Note (Signed)
Transition of Care (TOC) - Initial/Assessment Note  ? ? ?Patient Details  ?Name: Jeremy [name] ?MRN: [id number] ?Date of Birth: 06-19-1936 ? ?Transition of Care (TOC) CM/SW Contact:    ?Kerin Salen, RN ?Phone Number: ?11/23/2021, 4:17 PM ? ?Clinical Narrative:  ?Transition of Care (TOC) Screening Note ? ? ?Patient Details  ?Name: Jeremy [name] ?Date of Birth: September 15, 1935 ? ? ?Transition of Care (TOC) CM/SW Contact:    ?Kerin Salen, RN ?Phone Number: ?11/23/2021, 4:17 PM ? ? ? ?Transition of Care Department [facility] has reviewed patient and no TOC needs have been identified at this time. We will continue to monitor patient advancement through interdisciplinary progression rounds. If new patient transition needs arise, please place a TOC consult. ? ?                ? ? ?  ?  ? ? ?Patient Goals and CMS Choice ?  ?  ?  ? ?Expected Discharge Plan and Services ?  ?  ?  ?  ?  ?                ?  ?  ?  ?  ?  ?  ?  ?  ?  ?  ? ?Prior Living Arrangements/Services ?  ?  ?  ?       ?  ?  ?  ?  ? ?Activities of Daily Living ?Home Assistive Devices/Equipment: Nebulizer, Dentures (specify type) (upper and lower) ?ADL Screening (condition at time of admission) ?Patient's cognitive ability adequate to safely complete daily activities?: Yes ?Is the patient deaf or have difficulty hearing?: Yes ?Does the patient have difficulty seeing, even when wearing glasses/contacts?: Yes (reading glasses) ?Does the patient have difficulty concentrating, remembering, or making decisions?: No ?Patient able to express need for assistance with ADLs?: No ?Does the patient have difficulty dressing or bathing?: No ?Independently performs ADLs?: Yes (appropriate for developmental age) ?Does the patient have difficulty walking or climbing stairs?: No ?Weakness of Legs: None ?Weakness of Arms/Hands: None ? ?Permission Sought/Granted ?  ?  ?   ?   ?   ?   ? ?Emotional Assessment ?  ?  ?  ?  ?  ?  ? ?Admission diagnosis:  COPD exacerbation ([place])  [J44.1] ?Essential hypertension [I10] ?Patient Active Problem List  ? Diagnosis Date Noted  ? COPD exacerbation ([place]) 11/22/2021  ? COVID-19 virus infection 04/18/2019  ? COVID-19 04/17/2019  ? Special screening for malignant neoplasms, colon 09/21/2018  ? Diverticulosis of large intestine with hemorrhage   ? GI bleed due to NSAIDs 11/17/2017  ? Rectal bleed 11/16/2017  ? GI bleed 11/16/2017  ? Hypokalemia 11/16/2017  ? Chest pain 05/02/2016  ? H. pylori infection 08/15/2015  ? Mucosal abnormality of stomach   ? Reflux esophagitis   ? Peptic stricture of esophagus   ? Rectal bleeding 05/21/2015  ? Dyspepsia 05/21/2015  ? Dysphagia 05/21/2015  ? Sinus bradycardia 03/19/2014  ? Carotid artery disease ([place]) 03/15/2014  ? Essential hypertension   ? Hyperlipidemia   ? CAD (coronary artery disease)   ? Abnormal CXR   ? Ejection fraction   ? Tobacco abuse   ? COPD GOLD 2    ? ?PCP:  Celene Squibb, MD ?Pharmacy:   ?[id number] - [place] ?[place] ?[place] [address] ?Phone: [phone] Fax: [phone] ? ? ? ? ?  Social Determinants of Health (SDOH) Interventions ?  ? ?Readmission Risk Interventions ?No flowsheet data found. ? ? ?

## 2021-11-23 NOTE — Progress Notes (Signed)
Patient without c/o pain this shift, continues to have cough and expiratory wheezes during this shift. ?

## 2021-11-24 DIAGNOSIS — I10 Essential (primary) hypertension: Secondary | ICD-10-CM | POA: Diagnosis not present

## 2021-11-24 DIAGNOSIS — E782 Mixed hyperlipidemia: Secondary | ICD-10-CM | POA: Diagnosis not present

## 2021-11-24 DIAGNOSIS — J441 Chronic obstructive pulmonary disease with (acute) exacerbation: Secondary | ICD-10-CM | POA: Diagnosis not present

## 2021-11-24 DIAGNOSIS — E876 Hypokalemia: Secondary | ICD-10-CM | POA: Diagnosis not present

## 2021-11-24 LAB — HEMOGLOBIN A1C
Hgb A1c MFr Bld: 5.4 % (ref 4.8–5.6)
Mean Plasma Glucose: 108 mg/dL

## 2021-11-24 MED ORDER — REVEFENACIN 175 MCG/3ML IN SOLN
175.0000 ug | Freq: Every day | RESPIRATORY_TRACT | Status: DC
Start: 2021-11-24 — End: 2021-11-27
  Administered 2021-11-24 – 2021-11-27 (×3): 175 ug via RESPIRATORY_TRACT
  Filled 2021-11-24 (×3): qty 3

## 2021-11-24 MED ORDER — HYDRALAZINE HCL 20 MG/ML IJ SOLN
10.0000 mg | Freq: Once | INTRAMUSCULAR | Status: AC
  Administered 2021-11-24: 10 mg via INTRAVENOUS
  Filled 2021-11-24: qty 1

## 2021-11-24 NOTE — Progress Notes (Signed)
PROGRESS NOTE  Jeremy [name] [id number] DOB: [birth year] DOA: 11/22/2021 PCP: Celene Squibb, MD  Brief History:  86 year old male with a history of coronary disease, COPD, hyperlipidemia, hypertension, GI bleed presenting with 3-day history of shortness of breath that began on 11/19/2021.  The patient states that he had been working outside on 11/17/2021 and 11/18/2021 turned down a house and thought that he may have been exposed to outside allergens.  He has experienced increasing nonproductive cough, but it became yellowish on 11/22/2021.  He denies any hemoptysis, fever, chills, chest pain, nausea, vomiting, diarrhea, abdominal pain.  He went to see his PCP on the morning of 11/22/2021.  He was noted to be tachypneic and tachycardic and wheezing.  Subsequently, he was sent to the emergency department for further evaluation and treatment.  The patient states that he had to sleep in a recliner on the evening of 11/21/2021, but states that he normally sleeps on a flat bed and only has to be sitting up when his COPD acts up.  He denies any worsening lower extremity edema or increasing abdominal girth. Notably, the patient contacted Dr. Gustavus Bryant office on 11/19/2021 and was started on a prednisone taper, but this did not seem to help. In the ED, the patient was afebrile and hemodynamically stable.  He was tachycardic up to 120.  Oxygen saturation was 92% on room air.  Chest x-ray was negative for any frank infiltrate but showed subtle bilateral midlung nodularity.  EKG shows sinus rhythm with nonspecific ST changes.  BNP was 45.0.  The patient was given bronchodilators, Solu-Medrol, but the patient continued to have shortness of breath and wheezing.  The patient was admitted for further evaluation and treatment for the COPD exacerbation.  3/12--breathing a little better but remains sob with minimal exertion.  Denies f/c, cp, n/v/d, abd pain.  Complains of not being able to expectorate sputum;  94% on RA       Assessment and Plan: * COPD exacerbation (HCC) Continue Pulmicort Continue Brovana Continue DuoNebs Continue IV Solu-Medrol Continue Mucinex Start hypertonic saline to help pt expectorate Add doxy Start Yupelri  Hypokalemia Replete Check magnesium 2.0  Reflux esophagitis Continue pantoprazole twice daily  CAD (coronary artery disease) No chest pain presently Personally reviewed EKG--sinus tachycardia, nonspecific ST changes Continue ASA 81 mg daily  Hyperlipidemia Continue statin  Essential hypertension Continue lisinopril    Status is: Inpatient Remains inpatient appropriate because: severity of illness requiring IV steroids       Family Communication:   spouse updated at bedside 3/12   Consultants:  none   Code Status:  FULL   DVT Prophylaxis:  [place] Lovenox     Procedures: As Listed in Progress Note Above   Antibiotics: Doxy 3/12>>            Subjective: Patient feels like breathing is a little better now that he is able to cough up some sputum.  Denies f/c, cp, n/v/d, abd pain.  No hemoptysis  Objective: Vitals:   11/24/21 0713 11/24/21 1007 11/24/21 1343 11/24/21 1426  BP:    132/71  Pulse:    80  Resp:    19  Temp:    98.6 F (37 C)  TempSrc:    Oral  SpO2: 96% 96% 98% 95%  Weight:      Height:        Intake/Output Summary (Last 24 hours) at 11/24/2021 1753 Last data filed at 11/24/2021  1300 Gross per 24 hour  Intake 480 ml  Output --  Net 480 ml   Weight change:  Exam:  General:  Pt is alert, follows commands appropriately, not in acute distress HEENT: No icterus, No thrush, No neck mass, [place]/AT Cardiovascular: RRR, S1/S2, no rubs, no gallops Respiratory: bibasilar rales.  Bibasilar wheeze Abdomen: Soft/+BS, non tender, non distended, no guarding Extremities: No edema, No lymphangitis, No petechiae, No rashes, no synovitis   Data Reviewed: I have personally reviewed following labs and imaging studies Basic  Metabolic Panel: Recent Labs  Lab 11/22/21 1202 11/23/21 0408  NA 143 141  K 3.1* 4.2  CL 106 106  CO2 27 27  GLUCOSE 137* 141*  BUN 19 18  CREATININE 0.99 0.91  CALCIUM 8.8* 8.6*  MG  --  2.0   Liver Function Tests: Recent Labs  Lab 11/22/21 1202  AST 18  ALT 14  ALKPHOS 73  BILITOT 0.6  PROT 7.1  ALBUMIN 4.0   No results for input(s): LIPASE, AMYLASE in the last 168 hours. No results for input(s): AMMONIA in the last 168 hours. Coagulation Profile: No results for input(s): INR, PROTIME in the last 168 hours. CBC: Recent Labs  Lab 11/22/21 1202  WBC 6.3  HGB 14.6  HCT 45.4  MCV 88.2  PLT 194   Cardiac Enzymes: No results for input(s): CKTOTAL, CKMB, CKMBINDEX, TROPONINI in the last 168 hours. BNP: Invalid input(s): POCBNP CBG: No results for input(s): GLUCAP in the last 168 hours. HbA1C: Recent Labs    11/23/21 0408  HGBA1C 5.4   Urine analysis:    Component Value Date/Time   COLORURINE YELLOW 05/06/2021 0919   APPEARANCEUR CLEAR 05/06/2021 0919   LABSPEC 1.025 05/06/2021 0919   PHURINE 5.0 05/06/2021 0919   GLUCOSEU NEGATIVE 05/06/2021 0919   HGBUR NEGATIVE 05/06/2021 0919   BILIRUBINUR NEGATIVE 05/06/2021 0919   KETONESUR NEGATIVE 05/06/2021 0919   PROTEINUR NEGATIVE 05/06/2021 0919   UROBILINOGEN 0.2 10/22/2013 0820   NITRITE NEGATIVE 05/06/2021 0919   LEUKOCYTESUR NEGATIVE 05/06/2021 0919   Sepsis Labs: '@LABRCNTIP'$ (procalcitonin:4,lacticidven:4) ) Recent Results (from the past 240 hour(s))  Resp Panel by RT-PCR (Flu A&B, Covid) Nasopharyngeal Swab     Status: None   Collection Time: 11/22/21 12:02 PM   Specimen: Nasopharyngeal Swab; Nasopharyngeal(NP) swabs in vial transport medium  Result Value Ref Range Status   SARS Coronavirus 2 by RT PCR NEGATIVE NEGATIVE Final    Comment: (NOTE) SARS-CoV-2 target nucleic acids are NOT DETECTED.  The SARS-CoV-2 RNA is generally detectable in upper respiratory specimens during the acute phase of  infection. The lowest concentration of SARS-CoV-2 viral copies this assay can detect is 138 copies/mL. A negative result does not preclude SARS-Cov-2 infection and should not be used as the sole basis for treatment or other patient management decisions. A negative result may occur with  improper specimen collection/handling, submission of specimen other than nasopharyngeal swab, presence of viral mutation(s) within the areas targeted by this assay, and inadequate number of viral copies(<138 copies/mL). A negative result must be combined with clinical observations, patient history, and epidemiological information. The expected result is Negative.  Fact Sheet for Patients:  EntrepreneurPulse.com.au  Fact Sheet for Healthcare Providers:  IncredibleEmployment.be  This test is no t yet approved or cleared by the [place] FDA and  has been authorized for detection and/or diagnosis of SARS-CoV-2 by FDA under an Emergency Use Authorization (EUA). This EUA will remain  in effect (meaning this test can be used) for  the duration of the COVID-19 declaration under Section 564(b)(1) of the Act, 21 U.S.C.section 360bbb-3(b)(1), unless the authorization is terminated  or revoked sooner.       Influenza A by PCR NEGATIVE NEGATIVE Final   Influenza B by PCR NEGATIVE NEGATIVE Final    Comment: (NOTE) The Xpert Xpress SARS-CoV-2/FLU/RSV plus assay is intended as an aid in the diagnosis of influenza from Nasopharyngeal swab specimens and should not be used as a sole basis for treatment. Nasal washings and aspirates are unacceptable for Xpert Xpress SARS-CoV-2/FLU/RSV testing.  Fact Sheet for Patients: EntrepreneurPulse.com.au  Fact Sheet for Healthcare Providers: IncredibleEmployment.be  This test is not yet approved or cleared by the [place] FDA and has been authorized for detection and/or diagnosis of SARS-CoV-2  by FDA under an Emergency Use Authorization (EUA). This EUA will remain in effect (meaning this test can be used) for the duration of the COVID-19 declaration under Section 564(b)(1) of the Act, 21 U.S.C. section 360bbb-3(b)(1), unless the authorization is terminated or revoked.  Performed at [facility], [address] [place], [place] [address]      Scheduled Meds:  arformoterol  15 mcg Nebulization BID   aspirin EC  81 mg Oral QPM   budesonide (PULMICORT) nebulizer solution  0.5 mg Nebulization BID   cholecalciferol  2,000 Units Oral Daily   doxycycline  100 mg Oral Q12H   enoxaparin (LOVENOX) injection  40 mg Subcutaneous Q24H   guaiFENesin  1,200 mg Oral BID   levalbuterol  0.63 mg Nebulization Q6H   lisinopril  5 mg Oral Daily   loratadine  10 mg Oral Daily   methylPREDNISolone (SOLU-MEDROL) injection  120 mg Intravenous Daily   pantoprazole  40 mg Oral BID   revefenacin  175 mcg Nebulization Daily   simvastatin  40 mg Oral QPM   sodium chloride HYPERTONIC  4 mL Nebulization BID   vitamin B-12  100 mcg Oral Daily   Continuous Infusions:  Procedures/Studies: DG Chest Port 1 View  Result Date: 11/22/2021 CLINICAL DATA:  Shortness of breath and hypertension EXAM: PORTABLE CHEST 1 VIEW COMPARISON:  09/30/2021 FINDINGS: Emphysema noted. Atherosclerotic calcification of the aortic arch. Thoracic spondylosis. Ill-defined nodularity in both mid lungs. Small pulmonary nodules cannot be readily excluded. This could be inflammatory but the ligament pulmonary nodule cannot be excluded and CT chest is recommended for further characterization. No blunting of the costophrenic angles. Heart size within normal limits. IMPRESSION: 1. Subtle mid lung nodularity bilaterally, cannot exclude inflammatory or neoplastic pulmonary nodules. Chest CT is recommended for further characterization. 2. Aortic Atherosclerosis (ICD10-I70.0) and Emphysema (ICD10-J43.9). 3. Thoracic spondylosis. Electronically  Signed   By: Van Clines M.D.   On: 11/22/2021 12:37    Orson Eva, DO  [facility]  If 7PM-7AM, please contact night-coverage [email or web address] Password [facility] 11/24/2021, 5:53 PM   LOS: 2 days

## 2021-11-25 DIAGNOSIS — J441 Chronic obstructive pulmonary disease with (acute) exacerbation: Secondary | ICD-10-CM | POA: Diagnosis not present

## 2021-11-25 DIAGNOSIS — I10 Essential (primary) hypertension: Secondary | ICD-10-CM | POA: Diagnosis not present

## 2021-11-25 DIAGNOSIS — E876 Hypokalemia: Secondary | ICD-10-CM | POA: Diagnosis not present

## 2021-11-25 MED ORDER — HYDRALAZINE HCL 20 MG/ML IJ SOLN
10.0000 mg | Freq: Four times a day (QID) | INTRAMUSCULAR | Status: DC | PRN
Start: 2021-11-25 — End: 2021-11-27
  Administered 2021-11-25: 10 mg via INTRAVENOUS
  Filled 2021-11-25: qty 1

## 2021-11-25 MED ORDER — LOSARTAN POTASSIUM 50 MG PO TABS
50.0000 mg | ORAL_TABLET | Freq: Every day | ORAL | Status: DC
  Administered 2021-11-26 – 2021-11-27 (×2): 50 mg via ORAL
  Filled 2021-11-25 (×2): qty 1

## 2021-11-25 MED ORDER — DM-GUAIFENESIN ER 30-600 MG PO TB12
2.0000 | ORAL_TABLET | Freq: Two times a day (BID) | ORAL | Status: DC
  Administered 2021-11-25 – 2021-11-27 (×4): 2 via ORAL
  Filled 2021-11-25 (×5): qty 2

## 2021-11-25 MED ORDER — PANTOPRAZOLE SODIUM 40 MG PO TBEC
40.0000 mg | DELAYED_RELEASE_TABLET | Freq: Two times a day (BID) | ORAL | Status: DC
  Administered 2021-11-25 – 2021-11-27 (×4): 40 mg via ORAL
  Filled 2021-11-25 (×4): qty 1

## 2021-11-25 MED ORDER — METHYLPREDNISOLONE SODIUM SUCC 125 MG IJ SOLR
80.0000 mg | Freq: Two times a day (BID) | INTRAMUSCULAR | Status: DC
  Administered 2021-11-25 – 2021-11-27 (×4): 80 mg via INTRAVENOUS
  Filled 2021-11-25 (×4): qty 2

## 2021-11-25 NOTE — Progress Notes (Signed)
?  ?       ?PROGRESS NOTE ? ?Jeremy [name]:[id number] DOB: May 02, 1936 DOA: 11/22/2021 ?PCP: Celene Squibb, MD ? ?Brief History:  ?86 year old male with a history of coronary disease, COPD, hyperlipidemia, hypertension, GI bleed presenting with 3-day history of shortness of breath that began on 11/19/2021.  The patient states that he had been working outside on 11/17/2021 and 11/18/2021 turned down a house and thought that he may have been exposed to outside allergens.  He has experienced increasing nonproductive cough, but it became yellowish on 11/22/2021.  He denies any hemoptysis, fever, chills, chest pain, nausea, vomiting, diarrhea, abdominal pain.  He went to see his PCP on the morning of 11/22/2021.  He was noted to be tachypneic and tachycardic and wheezing.  Subsequently, he was sent to the emergency department for further evaluation and treatment.  The patient states that he had to sleep in a recliner on the evening of 11/21/2021, but states that he normally sleeps on a flat bed and only has to be sitting up when his COPD acts up.  He denies any worsening lower extremity edema or increasing abdominal girth. ?Notably, the patient contacted Dr. Gustavus Bryant office on 11/19/2021 and was started on a prednisone taper, but this did not seem to help. ?In the ED, the patient was afebrile and hemodynamically stable.  He was tachycardic up to 120.  Oxygen saturation was 92% on room air.  Chest x-ray was negative for any frank infiltrate but showed subtle bilateral midlung nodularity.  EKG shows sinus rhythm with nonspecific ST changes.  BNP was 45.0.  The patient was given bronchodilators, Solu-Medrol, but the patient continued to have shortness of breath and wheezing.  The patient was admitted for further evaluation and treatment for the COPD exacerbation. ? ?3/12--breathing a little better but remains sob with minimal exertion.  Denies f/c, cp, n/v/d, abd pain.  Complains of not being able to expectorate sputum;  94% on  RA ? ?3/13-3/14--continued slow improvement.  Able to expectorate a small amount of sputum.  Remains dyspneic with exertion.  Improvement incremental on optimal treatment>>consult pulm ?  ? ? ? ?Assessment and Plan: ?* COPD exacerbation ([place]) ?Continue Pulmicort ?Continue Brovana ?Continue DuoNebs ?Continue IV Solu-Medrol ?Continue Mucinex ?Start hypertonic saline to help pt expectorate ?Add doxy ?Start Yupelri ?3/14--consult pulmonary medicine ? ?Hypokalemia ?Replete ?Check magnesium 2.0 ? ?Reflux esophagitis ?Continue pantoprazole twice daily ? ?CAD (coronary artery disease) ?No chest pain presently ?Personally reviewed EKG--sinus tachycardia, nonspecific ST changes ?Continue ASA 81 mg daily ? ?Hyperlipidemia ?Continue statin ? ?Essential hypertension ?Continue lisinopril ? ? ? ? ? ? ? ? ?Status is: Inpatient ?Remains inpatient appropriate because: remains dyspneic on exertion.  Slow improvement with continued need for IV steroids and around the clock nebulizer treatment.  Pulmonary consult placed ? ? ? ?Family Communication:   spouse updated at bedside 3/14 ? ?Consultants:  Pulm ? ?Code Status:  FULL ? ?DVT Prophylaxis:  [place] Lovenox ? ? ?Procedures: ?As Listed in Progress Note Above ? ?Antibiotics: ?Doxy 3/12>> ? ? ? ? ? ? ? ?Subjective: ?Continues to have dyspnea on exertion. Denies f/c cp, n/v/d, abd pain, hemoptysis ? ?Objective: ?Vitals:  ? 11/24/21 2300 11/25/21 0000 11/25/21 0504 11/25/21 0712  ?BP: (!) 188/92 (!) 164/84 133/84   ?Pulse:   93   ?Resp:      ?Temp:      ?TempSrc:      ?SpO2:   91% 94%  ?Weight:      ?  Height:      ? ? ?Intake/Output Summary (Last 24 hours) at 11/25/2021 1240 ?Last data filed at 11/24/2021 1850 ?Gross per 24 hour  ?Intake 480 ml  ?Output --  ?Net 480 ml  ? ?Weight change:  ?Exam: ? ?General:  Pt is alert, follows commands appropriately, not in acute distress ?HEENT: No icterus, No thrush, No neck mass, [place]/AT ?Cardiovascular: RRR, S1/S2, no rubs, no gallops ?Respiratory: bibasilar  rales.  Bilateral wheeze ?Abdomen: Soft/+BS, non tender, non distended, no guarding ?Extremities: No edema, No lymphangitis, No petechiae, No rashes, no synovitis ? ? ?Data Reviewed: ?I have personally reviewed following labs and imaging studies ?Basic Metabolic Panel: ?Recent Labs  ?Lab 11/22/21 ?1202 11/23/21 ?0408  ?NA 143 141  ?K 3.1* 4.2  ?CL 106 106  ?CO2 27 27  ?GLUCOSE 137* 141*  ?BUN 19 18  ?CREATININE 0.99 0.91  ?CALCIUM 8.8* 8.6*  ?MG  --  2.0  ? ?Liver Function Tests: ?Recent Labs  ?Lab 11/22/21 ?1202  ?AST 18  ?ALT 14  ?ALKPHOS 73  ?BILITOT 0.6  ?PROT 7.1  ?ALBUMIN 4.0  ? ?No results for input(s): LIPASE, AMYLASE in the last 168 hours. ?No results for input(s): AMMONIA in the last 168 hours. ?Coagulation Profile: ?No results for input(s): INR, PROTIME in the last 168 hours. ?CBC: ?Recent Labs  ?Lab 11/22/21 ?1202  ?WBC 6.3  ?HGB 14.6  ?HCT 45.4  ?MCV 88.2  ?PLT 194  ? ?Cardiac Enzymes: ?No results for input(s): CKTOTAL, CKMB, CKMBINDEX, TROPONINI in the last 168 hours. ?BNP: ?Invalid input(s): POCBNP ?CBG: ?No results for input(s): GLUCAP in the last 168 hours. ?HbA1C: ?Recent Labs  ?  11/23/21 ?0408  ?HGBA1C 5.4  ? ?Urine analysis: ?   ?Component Value Date/Time  ? [place] 05/06/2021 0919  ? APPEARANCEUR CLEAR 05/06/2021 0919  ? LABSPEC 1.025 05/06/2021 0919  ? PHURINE 5.0 05/06/2021 0919  ? GLUCOSEU NEGATIVE 05/06/2021 0919  ? [place] NEGATIVE 05/06/2021 0919  ? [place] NEGATIVE 05/06/2021 0919  ? [place] NEGATIVE 05/06/2021 0919  ? [place] NEGATIVE 05/06/2021 0919  ? UROBILINOGEN 0.2 10/22/2013 0820  ? NITRITE NEGATIVE 05/06/2021 0919  ? LEUKOCYTESUR NEGATIVE 05/06/2021 0919  ? ?Sepsis Labs: ?'@LABRCNTIP'$ (procalcitonin:4,lacticidven:4) ?) ?Recent Results (from the past 240 hour(s))  ?Resp Panel by RT-PCR (Flu A&B, Covid) Nasopharyngeal Swab     Status: None  ? Collection Time: 11/22/21 12:02 PM  ? Specimen: Nasopharyngeal Swab; Nasopharyngeal(NP) swabs in vial transport medium  ?Result  Value Ref Range Status  ? SARS Coronavirus 2 by RT PCR NEGATIVE NEGATIVE Final  ?  Comment: (NOTE) ?SARS-CoV-2 target nucleic acids are NOT DETECTED. ? ?The SARS-CoV-2 RNA is generally detectable in upper respiratory ?specimens during the acute phase of infection. The lowest ?concentration of SARS-CoV-2 viral copies this assay can detect is ?138 copies/mL. A negative result does not preclude SARS-Cov-2 ?infection and should not be used as the sole basis for treatment or ?other patient management decisions. A negative result may occur with  ?improper specimen collection/handling, submission of specimen other ?than nasopharyngeal swab, presence of viral mutation(s) within the ?areas targeted by this assay, and inadequate number of viral ?copies(<138 copies/mL). A negative result must be combined with ?clinical observations, patient history, and epidemiological ?information. The expected result is Negative. ? ?Fact Sheet for Patients:  ?EntrepreneurPulse.com.au ? ?Fact Sheet for Healthcare Providers:  ?IncredibleEmployment.be ? ?This test is no t yet approved or cleared by the [place] FDA and  ?has been authorized for detection and/or diagnosis of SARS-CoV-2 by ?  FDA under an Emergency Use Authorization (EUA). This EUA will remain  ?in effect (meaning this test can be used) for the duration of the ?COVID-19 declaration under Section 564(b)(1) of the Act, 21 ?U.S.C.section 360bbb-3(b)(1), unless the authorization is terminated  ?or revoked sooner.  ? ? ?  ? Influenza A by PCR NEGATIVE NEGATIVE Final  ? Influenza B by PCR NEGATIVE NEGATIVE Final  ?  Comment: (NOTE) ?The Xpert Xpress SARS-CoV-2/FLU/RSV plus assay is intended as an aid ?in the diagnosis of influenza from Nasopharyngeal swab specimens and ?should not be used as a sole basis for treatment. Nasal washings and ?aspirates are unacceptable for Xpert Xpress SARS-CoV-2/FLU/RSV ?testing. ? ?Fact Sheet for  Patients: ?EntrepreneurPulse.com.au ? ?Fact Sheet for Healthcare Providers: ?IncredibleEmployment.be ? ?This test is not yet approved or cleared by the [place] and ?has been Child psychotherapist

## 2021-11-25 NOTE — Plan of Care (Signed)

## 2021-11-25 NOTE — Final Consult Note (Signed)
? ?NAME:  Jeremy [name], MRN:  [id number], DOB:  16-Jan-1936, LOS: 3 ?ADMISSION DATE:  11/22/2021, CONSULTATION DATE:  3/14  ?REFERRING MD:  Tat/Triad, CHIEF COMPLAINT:  [email or web address]  ? ?History of Present Illness:  ?86 year old male with a history of coronary disease, COPD, hyperlipidemia, hypertension, GI bleed presented with 3-day history of shortness of breath that began on 11/19/2021.  The patient states that he had been working outside on 11/17/2021 and 11/18/2021 turned down a house and thought that he may have been exposed to outside allergens.  He has experienced increasing nonproductive cough, but it became yellowish on 11/22/2021.  He denied any hemoptysis, fever, chills, chest pain, nausea, vomiting, diarrhea, abdominal pain.  He went to see his PCP on the morning of 11/22/2021.  He was noted to be tachypneic and tachycardic and wheezing.  Subsequently, he was sent to the emergency department for further evaluation and treatment.  The patient states that he had to sleep in a recliner on the evening of 11/21/2021, but states that he normally sleeps on a flat bed and only has to be sitting up when his COPD acts up.  He denied any worsening lower extremity edema or increasing abdominal girth. ?Notably, the patient contacted Dr. Gustavus Bryant office on 11/19/2021 and was started on a prednisone taper, but this did not seem to help. ?In the ED, the patient was afebrile and hemodynamically stable.  He was tachycardic up to 120.  Oxygen saturation was 92% on room air.  Chest x-ray was negative for any frank infiltrate but showed subtle bilateral midlung nodularity.  EKG shows sinus rhythm with nonspecific ST changes.  BNP was 45.0.  The patient was given bronchodilators, Solu-Medrol, but the patient continued to have shortness of breath and wheezing.  The patient was admitted for further evaluation and treatment for the COPD exacerbation. ?  ?3/12--breathing a little better but remained sob with minimal exertion.  Denies f/c, cp,  n/v/d, abd pain.  Complains of not being able to expectorate sputum;  94% on RA ?  ?3/13-3/14--continued slow improvement.  Able to expectorate a small amount of sputum.  Remains dyspneic with exertion.  Improvement incremental on optimal treatment>>consult pulm ? ? ?Preior pulmonary w/u complted 10/13/21: ?Quit smoking 1990  ?- Spirometry 11/08/15   FEV1 1.89 (58%)  Ratio 0.49 p 10% improvement from saba  ?- 10/13/2021  After extensive coaching inhaler device,  effectiveness =    75% (short Ti)  ?Rec breztri 2bid and d/c ACEi  > did not do latter.  ? ? ?  ? ?Significant Hospital Events: ?Including procedures, antibiotic start and stop dates in addition to other pertinent events   ?D/c ACEi  ? ? ? ?Scheduled Meds: ? arformoterol  15 mcg Nebulization BID  ? aspirin EC  81 mg Oral QPM  ? budesonide (PULMICORT) nebulizer solution  0.5 mg Nebulization BID  ? cholecalciferol  2,000 Units Oral Daily  ? doxycycline  100 mg Oral Q12H  ? enoxaparin (LOVENOX) injection  40 mg Subcutaneous Q24H  ? guaiFENesin  1,200 mg Oral BID  ? levalbuterol  0.63 mg Nebulization Q6H  ? loratadine  10 mg Oral Daily  ? methylPREDNISolone (SOLU-MEDROL) injection  120 mg Intravenous Daily  ? pantoprazole  40 mg Oral BID  ? revefenacin  175 mcg Nebulization Daily  ? simvastatin  40 mg Oral QPM  ? sodium chloride HYPERTONIC  4 mL Nebulization BID  ? vitamin B-12  100 mcg Oral Daily  ? ?Continuous Infusions: ?PRN Meds:.acetaminophen **  OR** acetaminophen, diphenhydrAMINE, hydrALAZINE, ondansetron **OR** ondansetron (ZOFRAN) IV  ? ? ?Interim History / Subjective:  ?Very prominent pseudowheeze / sob at rest but no cp, min hoarseness ? ?Objective   ?Blood pressure 132/76, pulse 90, temperature 98.6 ?F (37 ?C), temperature source Oral, resp. rate 19, height 6' (1.829 m), weight 84.8 kg, SpO2 94 %. ?   ?   ? ?Intake/Output Summary (Last 24 hours) at 11/25/2021 1444 ?Last data filed at 11/25/2021 1300 ?Gross per 24 hour  ?Intake 720 ml  ?Output --  ?Net 720  ml  ? ?Filed Weights  ? 11/22/21 1200  ?Weight: 84.8 kg  ? ? ?Examination: ?Tmax 98.6  ? ?HEENT :  orophx clear  ? ? ?NECK :  without JVD/Nodes/TM/ nl carotid upstrokes bilaterally ? ? ?LUNGS: no acc muscle use,  Mod barrel  contour chest wall with bilateral  Distant bs s audible wheeze and  without cough on insp or exp maneuvers  and   Hyperresonant  to  percussion bilaterally   ? ? ?CV:  RRR  no s3 or murmur or increase in P2, and no edema  ? ?ABD:  soft and nontender with pos mid  insp Hoover's  in the supine position. No bruits or organomegaly appreciated, bowel sounds nl ? ?MS:   Nl gait/  ext warm without deformities, calf tenderness, cyanosis or clubbing ?No obvious joint restrictions  ? ?SKIN: warm and dry without lesions   ? ?NEURO:  alert, approp, nl sensorium with  no motor or cerebellar deficits apparent.  ? ? ? ?I personally reviewed images and agree with radiology impression as follows:  ?CXR:   portable 11/22/21 ?1. Subtle mid lung nodularity bilaterally, cannot exclude ?inflammatory or neoplastic pulmonary nodules. Chest CT is ?recommended for further characterization. ?2. Aortic Atherosclerosis (ICD10-I70.0) and Emphysema (ICD10-J43.9). ?   ? ?  ? ?Assessment & Plan:  ?1)  AECOPD characterized mostly by pseudowheeze in pt on ACEi  s hypoxemia  ?>>> d/c acei / arb is ok  ?>>> continue steroids, nebs, max gerd rx/ flutter valve as you are  ? ?NB: ?When respiratory symptoms begin or become refractory well after a patient reports complete smoking cessation,  Especially when this wasn't the case while they were smoking, a red flag is raised based on the work of Dr Kris Mouton which states: if you quit smoking when your best day FEV1 is still relatively  preserved it is highly unlikely you will progress to severe disease or start developing aecopd if not problem historically  ? ?That is to say, once the smoking stops,  the symptoms should not suddenly erupt or markedly worsen.  If so, the differential  diagnosis should include  obesity/deconditioning,  LPR/Reflux/Aspiration syndromes,  occult CHF, or  especially side effect of medications commonly used in this population like ACEi ? ? ?The pseudowheeze is classic as is the preservation of nl 02 sats during exacerbation as this is an upper airway, not lung, issue.  He should clear in the next week or two and f/u with me at d/c.   ?  ? ?Labs   ?CBC: ?Recent Labs  ?Lab 11/22/21 ?1202  ?WBC 6.3  ?HGB 14.6  ?HCT 45.4  ?MCV 88.2  ?PLT 194  ? ? ?Basic Metabolic Panel: ?Recent Labs  ?Lab 11/22/21 ?1202 11/23/21 ?0408  ?NA 143 141  ?K 3.1* 4.2  ?CL 106 106  ?CO2 27 27  ?GLUCOSE 137* 141*  ?BUN 19 18  ?CREATININE 0.99 0.91  ?CALCIUM 8.8*  8.6*  ?MG  --  2.0  ? ?GFR: ?Estimated Creatinine Clearance: 67.5 mL/min (by C-G formula based on SCr of 0.91 mg/dL). ?Recent Labs  ?Lab 11/22/21 ?1202  ?WBC 6.3  ? ? ?Liver Function Tests: ?Recent Labs  ?Lab 11/22/21 ?1202  ?AST 18  ?ALT 14  ?ALKPHOS 73  ?BILITOT 0.6  ?PROT 7.1  ?ALBUMIN 4.0  ? ?No results for input(s): LIPASE, AMYLASE in the last 168 hours. ?No results for input(s): AMMONIA in the last 168 hours. ? ?ABG ?No results found for: PHART, PCO2ART, PO2ART, HCO3, TCO2, ACIDBASEDEF, O2SAT  ? ?Coagulation Profile: ?No results for input(s): INR, PROTIME in the last 168 hours. ? ?Cardiac Enzymes: ?No results for input(s): CKTOTAL, CKMB, CKMBINDEX, TROPONINI in the last 168 hours. ? ?HbA1C: ?Hgb A1c MFr Bld  ?Date/Time Value Ref Range Status  ?11/23/2021 04:08 AM 5.4 4.8 - 5.6 % Final  ?  Comment:  ?  (NOTE) ?        Prediabetes: 5.7 - 6.4 ?        Diabetes: >6.4 ?        Glycemic control for adults with diabetes: <7.0 ?  ?02/23/2017 07:42 AM 5.0 <5.7 % Final  ?  Comment:  ?    ?For the purpose of screening for the presence of diabetes: ?  ?<5.7%       Consistent with the absence of diabetes ?5.7-6.4 %   Consistent with increased risk for diabetes (prediabetes) ?>=6.5 %     Consistent with diabetes ?  ?This assay result is consistent  with a decreased risk of diabetes. ?  ?Currently, no consensus exists regarding use of hemoglobin A1c for ?diagnosis of diabetes in children. ?  ?According to American Diabetes Association (ADA) guidelines, ?hemog

## 2021-11-26 DIAGNOSIS — J441 Chronic obstructive pulmonary disease with (acute) exacerbation: Secondary | ICD-10-CM | POA: Diagnosis not present

## 2021-11-26 DIAGNOSIS — K2100 Gastro-esophageal reflux disease with esophagitis, without bleeding: Secondary | ICD-10-CM | POA: Diagnosis not present

## 2021-11-26 DIAGNOSIS — I10 Essential (primary) hypertension: Secondary | ICD-10-CM | POA: Diagnosis not present

## 2021-11-26 DIAGNOSIS — E782 Mixed hyperlipidemia: Secondary | ICD-10-CM | POA: Diagnosis not present

## 2021-11-26 MED ORDER — LEVALBUTEROL HCL 0.63 MG/3ML IN NEBU
0.6300 mg | INHALATION_SOLUTION | Freq: Three times a day (TID) | RESPIRATORY_TRACT | Status: DC
  Administered 2021-11-27: 0.63 mg via RESPIRATORY_TRACT
  Filled 2021-11-26 (×2): qty 3

## 2021-11-26 NOTE — Progress Notes (Signed)
?Progress Note ? ? ?Patient: Jeremy [name] AUQ:[id number] DOB: 1936/09/08 DOA: 11/22/2021     4 ?DOS: the patient was seen and examined on 11/26/2021 ?  ?Brief hospital course: ?86 year old male with a history of coronary disease, COPD, hyperlipidemia, hypertension, GI bleed presenting with 3-day history of shortness of breath that began on 11/19/2021.  The patient states that he had been working outside on 11/17/2021 and 11/18/2021 turned down a house and thought that he may have been exposed to outside allergens.  He has experienced increasing nonproductive cough, but it became yellowish on 11/22/2021.  He denies any hemoptysis, fever, chills, chest pain, nausea, vomiting, diarrhea, abdominal pain.  He went to see his PCP on the morning of 11/22/2021.  He was noted to be tachypneic and tachycardic and wheezing.  Subsequently, he was sent to the emergency department for further evaluation and treatment.  The patient states that he had to sleep in a recliner on the evening of 11/21/2021, but states that he normally sleeps on a flat bed and only has to be sitting up when his COPD acts up.  He denies any worsening lower extremity edema or increasing abdominal girth. ?Notably, the patient contacted Dr. Gustavus Bryant office on 11/19/2021 and was started on a prednisone taper, but this did not seem to help. ?In the ED, the patient was afebrile and hemodynamically stable.  He was tachycardic up to 120.  Oxygen saturation was 92% on room air.  Chest x-ray was negative for any frank infiltrate but showed subtle bilateral midlung nodularity.  EKG shows sinus rhythm with nonspecific ST changes.  BNP was 45.0.  The patient was given bronchodilators, Solu-Medrol, but the patient continued to have shortness of breath and wheezing.  The patient was admitted for further evaluation and treatment for the COPD exacerbation. ? ?3/12--breathing a little better but remains sob with minimal exertion.  Denies f/c, cp, n/v/d, abd pain.  Complains of not being  able to expectorate sputum;  94% on RA ? ?3/13-3/14--continued slow improvement.  Able to expectorate a small amount of sputum.  Remains dyspneic with exertion.  Improvement incremental on optimal treatment>>consult pulm ? ?Assessment and Plan: ?* COPD exacerbation ([place]) ?Continue Pulmicort ?Continue Brovana ?Continue DuoNebs ?Continue IV Solu-Medrol ?Continue Mucinex ?Continue hypertonic saline to help pt expectorate ?Continue doxycycline ?Start Yupelri ?Pulmonology following, appreciate assistance ?Lisinopril discontinued in favor of losartan ? ?Hypokalemia ?Replete ?Check magnesium 2.0 ? ?Reflux esophagitis ?Continue pantoprazole twice daily ? ?CAD (coronary artery disease) ?No chest pain presently ?Personally reviewed EKG--sinus tachycardia, nonspecific ST changes ?Continue ASA 81 mg daily ? ?Hyperlipidemia ?Continue statin ? ?Essential hypertension ?Currently on losartan ? ? ? ? ?  ? ?Subjective: Still has some wheezing, but overall reports slow improvement ? ?Physical Exam: ?Vitals:  ? 11/26/21 1212 11/26/21 1358 11/26/21 1909 11/26/21 1917  ?BP: (!) 141/96  (!) 164/89   ?Pulse: (!) 106  97   ?Resp: 18  20   ?Temp: 98.6 ?F (37 ?C)  98.4 ?F (36.9 ?C)   ?TempSrc: Oral  Oral   ?SpO2: 95% 92% 94% 93%  ?Weight:      ?Height:      ? ?General exam: Alert, awake, oriented x 3 ?Respiratory system: Bilateral wheezing ?Cardiovascular system:RRR. No murmurs, rubs, gallops. ?Gastrointestinal system: Abdomen is nondistended, soft and nontender. No organomegaly or masses felt. Normal bowel sounds heard. ?Central nervous system: Alert and oriented. No focal neurological deficits. ?Extremities: No C/C/E, +pedal pulses ?Skin: No rashes, lesions or ulcers ?Psychiatry: Judgement and insight appear  normal. Mood & affect appropriate.  ? ?Data Reviewed: ? ?Reviewed CBC and chemistry ? ?Family Communication: Discussed with patient's wife at the bedside ? ?Disposition: ?Status is: Inpatient ?Remains inpatient appropriate because:  Continued IV steroids for persistent wheezing and shortness of breath ? Planned Discharge Destination: Home ? ? ? ?Time spent: 35 minutes ? ?Author: ?Kathie Dike, MD ?11/26/2021 7:59 PM ? ?For on call review [email or web address].  ?

## 2021-11-27 DIAGNOSIS — E782 Mixed hyperlipidemia: Secondary | ICD-10-CM | POA: Diagnosis not present

## 2021-11-27 DIAGNOSIS — J441 Chronic obstructive pulmonary disease with (acute) exacerbation: Secondary | ICD-10-CM | POA: Diagnosis not present

## 2021-11-27 DIAGNOSIS — E876 Hypokalemia: Secondary | ICD-10-CM | POA: Diagnosis not present

## 2021-11-27 DIAGNOSIS — I10 Essential (primary) hypertension: Secondary | ICD-10-CM | POA: Diagnosis not present

## 2021-11-27 MED ORDER — LOSARTAN POTASSIUM 50 MG PO TABS: 50.0000 mg | ORAL_TABLET | Freq: Every day | ORAL | 1 refills | Status: DC

## 2021-11-27 MED ORDER — PREDNISONE 10 MG PO TABS: ORAL_TABLET | ORAL | 0 refills | Status: DC

## 2021-11-27 MED ORDER — DM-GUAIFENESIN ER 30-600 MG PO TB12
2.0000 | ORAL_TABLET | Freq: Two times a day (BID) | ORAL | 0 refills | Status: DC
Start: 2021-11-27 — End: 2022-10-12

## 2021-11-27 NOTE — Plan of Care (Signed)
?  Problem: Education: ?Goal: Knowledge of General Education information will improve ?Description: Including pain rating scale, medication(s)/side effects and non-pharmacologic comfort measures ?11/27/2021 1259 by Melony Overly, RN ?Outcome: Adequate for Discharge ?11/27/2021 0836 by Melony Overly, RN ?Outcome: Progressing ?  ?Problem: Health Behavior/Discharge Planning: ?Goal: Ability to manage health-related needs will improve ?11/27/2021 1259 by Melony Overly, RN ?Outcome: Adequate for Discharge ?11/27/2021 0836 by Melony Overly, RN ?Outcome: Progressing ?  ?Problem: Clinical Measurements: ?Goal: Ability to maintain clinical measurements within normal limits will improve ?11/27/2021 1259 by Melony Overly, RN ?Outcome: Adequate for Discharge ?11/27/2021 0836 by Melony Overly, RN ?Outcome: Progressing ?Goal: Will remain free from infection ?11/27/2021 1259 by Melony Overly, RN ?Outcome: Adequate for Discharge ?11/27/2021 0836 by Melony Overly, RN ?Outcome: Progressing ?Goal: Diagnostic test results will improve ?11/27/2021 1259 by Melony Overly, RN ?Outcome: Adequate for Discharge ?11/27/2021 0836 by Melony Overly, RN ?Outcome: Progressing ?Goal: Respiratory complications will improve ?11/27/2021 1259 by Melony Overly, RN ?Outcome: Adequate for Discharge ?11/27/2021 0836 by Melony Overly, RN ?Outcome: Progressing ?Goal: Cardiovascular complication will be avoided ?11/27/2021 1259 by Melony Overly, RN ?Outcome: Adequate for Discharge ?11/27/2021 0836 by Melony Overly, RN ?Outcome: Progressing ?  ?Problem: Activity: ?Goal: Risk for activity intolerance will decrease ?11/27/2021 1259 by Melony Overly, RN ?Outcome: Adequate for Discharge ?11/27/2021 0836 by Melony Overly, RN ?Outcome: Progressing ?  ?Problem: Nutrition: ?Goal: Adequate nutrition will be maintained ?11/27/2021 1259 by Melony Overly, RN ?Outcome: Adequate for Discharge ?11/27/2021 0836 by Melony Overly, RN ?Outcome: Progressing ?   ?Problem: Coping: ?Goal: Level of anxiety will decrease ?11/27/2021 1259 by Melony Overly, RN ?Outcome: Adequate for Discharge ?11/27/2021 0836 by Melony Overly, RN ?Outcome: Progressing ?  ?Problem: Elimination: ?Goal: Will not experience complications related to bowel motility ?11/27/2021 1259 by Melony Overly, RN ?Outcome: Adequate for Discharge ?11/27/2021 0836 by Melony Overly, RN ?Outcome: Progressing ?Goal: Will not experience complications related to urinary retention ?11/27/2021 1259 by Melony Overly, RN ?Outcome: Adequate for Discharge ?11/27/2021 0836 by Melony Overly, RN ?Outcome: Progressing ?  ?Problem: Pain Managment: ?Goal: General experience of comfort will improve ?11/27/2021 1259 by Melony Overly, RN ?Outcome: Adequate for Discharge ?11/27/2021 0836 by Melony Overly, RN ?Outcome: Progressing ?  ?Problem: Safety: ?Goal: Ability to remain free from injury will improve ?11/27/2021 1259 by Melony Overly, RN ?Outcome: Adequate for Discharge ?11/27/2021 0836 by Melony Overly, RN ?Outcome: Progressing ?  ?Problem: Skin Integrity: ?Goal: Risk for impaired skin integrity will decrease ?11/27/2021 1259 by Melony Overly, RN ?Outcome: Adequate for Discharge ?11/27/2021 0836 by Melony Overly, RN ?Outcome: Progressing ?  ?

## 2021-11-27 NOTE — Plan of Care (Signed)

## 2021-11-27 NOTE — Care Management Important Message (Signed)
Important Message ? ?Patient Details  ?Name: Jeremy [name] ?MRN: [id number] ?Date of Birth: 1936/09/14 ? ? ?Medicare Important Message Given:  Yes (late entry, copy provided) ? ? ? ? ?Tommy Medal ?11/27/2021, 1:48 PM ?

## 2021-11-28 ENCOUNTER — Other Ambulatory Visit: Payer: Self-pay

## 2021-11-28 ENCOUNTER — Telehealth: Payer: Self-pay

## 2021-11-28 DIAGNOSIS — J449 Chronic obstructive pulmonary disease, unspecified: Secondary | ICD-10-CM

## 2021-11-28 NOTE — Telephone Encounter (Signed)
Received referral for HFU on patient after admission to [facility] for COPD exacerbation 3/13-3/16.  ?Called and scheduled patient for 3/24 Friday at 2:30 PM with Dr. Melvyn Novas.  ?Was discussed with Dr. Melvyn Novas yesterday using the Friday PM slots.  ?Will route to Dr. Melvyn Novas to make him aware patient is on schedule in [facility] office Friday pm 12/05/2021  ?

## 2021-11-28 NOTE — Discharge Summary (Addendum)
Physician Discharge Summary  ?Jeremy [name] VPX:[id number] DOB: 1936/02/19 DOA: 11/22/2021 ? ?PCP: Celene Squibb, MD ? ?Admit date: 11/22/2021 ?Discharge date: 11/27/2021 ? ?Admitted From: home ?Disposition:  home ? ?Recommendations for Outpatient Follow-up:  ?Follow up with PCP in 1-2 weeks ?Please obtain BMP/CBC in one week ?Follow up with pulmonology ? ?Home Health: ?Equipment/Devices: ? ?Discharge Condition:stable ?CODE STATUS:full code ?Diet recommendation: heart healthy ? ?Brief/Interim Summary: ?86 year old male with a history of coronary disease, COPD, hyperlipidemia, hypertension, GI bleed presenting with 3-day history of shortness of breath that began on 11/19/2021.  The patient states that he had been working outside on 11/17/2021 and 11/18/2021 turned down a house and thought that he may have been exposed to outside allergens.  He has experienced increasing nonproductive cough, but it became yellowish on 11/22/2021.  He denies any hemoptysis, fever, chills, chest pain, nausea, vomiting, diarrhea, abdominal pain.  He went to see his PCP on the morning of 11/22/2021.  He was noted to be tachypneic and tachycardic and wheezing.  Subsequently, he was sent to the emergency department for further evaluation and treatment.  The patient states that he had to sleep in a recliner on the evening of 11/21/2021, but states that he normally sleeps on a flat bed and only has to be sitting up when his COPD acts up.  He denies any worsening lower extremity edema or increasing abdominal girth. ?Notably, the patient contacted Dr. Gustavus Bryant office on 11/19/2021 and was started on a prednisone taper, but this did not seem to help. ?In the ED, the patient was afebrile and hemodynamically stable.  He was tachycardic up to 120.  Oxygen saturation was 92% on room air.  Chest x-ray was negative for any frank infiltrate but showed subtle bilateral midlung nodularity.  EKG shows sinus rhythm with nonspecific ST changes.  BNP was 45.0.  The patient  was given bronchodilators, Solu-Medrol, but the patient continued to have shortness of breath and wheezing.  The patient was admitted for further evaluation and treatment for the COPD exacerbation. ? ?Discharge Diagnoses:  ?Principal Problem: ?  COPD exacerbation ([place]) ?Active Problems: ?  Essential hypertension ?  Hyperlipidemia ?  CAD (coronary artery disease) ?  COPD GOLD 2  ?  Reflux esophagitis ?  Hypokalemia ? ?COPD exacerbation ([place]) ?Continue breztri on discharge ?Continue DuoNebs ?Treated with solumedrol, transition to prednisone taper on discharge ?Continue Mucinex ?Completed doxycycline as inpatient ?Pulmonology following, appreciate assistance ?Lisinopril discontinued in favor of losartan ?  ?Hypokalemia ?Replete ?Check magnesium 2.0 ?  ?Reflux esophagitis ?Continue pantoprazole twice daily ?  ?CAD (coronary artery disease) ?No chest pain presently ?Personally reviewed EKG--sinus tachycardia, nonspecific ST changes ?Continue ASA 81 mg daily ?  ?Hyperlipidemia ?Continue statin ?  ?Essential hypertension ?Currently on losartan ? ?Discharge Instructions ? ?Discharge Instructions   ? ? Ambulatory referral to Pulmonology   Complete by: As directed ?  ? Needs follow up visit after hospitalization  ? Reason for referral: Asthma/COPD  ? Diet - low sodium heart healthy   Complete by: As directed ?  ? Increase activity slowly   Complete by: As directed ?  ? ?  ? ?Allergies as of 11/27/2021   ?No Known Allergies ?  ? ?  ?Medication List  ?  ? ?STOP taking these medications   ? ?lisinopril 5 MG tablet ?Commonly known as: ZESTRIL ?  ? ?  ? ?TAKE these medications   ? ?acetaminophen 500 MG tablet ?Commonly known as: TYLENOL ?Take 500-1,000 mg by mouth  daily as needed for moderate pain or headache. ?  ?albuterol 108 (90 Base) MCG/ACT inhaler ?Commonly known as: VENTOLIN HFA ?Inhale 1-2 puffs into the lungs every 6 (six) hours as needed for wheezing or shortness of breath. ?  ?aspirin 81 MG tablet ?Take 1 tablet (81 mg  total) by mouth every evening. ?  ?Breztri Aerosphere 160-9-4.8 MCG/ACT Aero ?Generic drug: Budeson-Glycopyrrol-Formoterol ?Inhale 2 puffs into the lungs 2 (two) times daily. ?  ?dextromethorphan-guaiFENesin 30-600 MG 12hr tablet ?Commonly known as: [place] DM ?Take 2 tablets by mouth 2 (two) times daily. ?  ?diphenhydrAMINE 25 MG tablet ?Commonly known as: BENADRYL ?Take 12.5 mg by mouth at bedtime as needed for sleep. ?  ?ICY HOT EX ?Apply 1 application topically daily as needed (pain). ?  ?ipratropium-albuterol 0.5-2.5 (3) MG/3ML Soln ?Commonly known as: DUONEB ?Take 3 mLs by nebulization every 4 (four) hours as needed (Shortness of breath). ?  ?loratadine 10 MG tablet ?Commonly known as: CLARITIN ?Take 10 mg by mouth daily. ?  ?losartan 50 MG tablet ?Commonly known as: COZAAR ?Take 1 tablet (50 mg total) by mouth daily. ?  ?nitroGLYCERIN 0.4 MG SL tablet ?Commonly known as: NITROSTAT ?PLACE 1 TABLET UNDER THE TONGUE EVERY 5 MINUTES FOR 3 DOSES AS NEEDED FOR CHEST PAIN ?What changed: See the new instructions. ?  ?pantoprazole 40 MG tablet ?Commonly known as: PROTONIX ?TAKE 1 TABLET(40 MG) BY MOUTH TWICE DAILY ?What changed: See the new instructions. ?  ?predniSONE 10 MG tablet ?Commonly known as: DELTASONE ?Take '40mg'$  po daily for 2 days then '30mg'$  daily for 2 days then '20mg'$  daily for 2 days then '10mg'$  daily for 2 days then stop ?What changed: See the new instructions. ?  ?simvastatin 40 MG tablet ?Commonly known as: ZOCOR ?TAKE 1 TABLET(40 MG) BY MOUTH EVERY EVENING ?What changed: See the new instructions. ?  ?vitamin B-12 100 MCG tablet ?Commonly known as: CYANOCOBALAMIN ?Take 100 mcg by mouth daily. ?  ?Vitamin D3 50 MCG (2000 UT) Chew ?Chew 1 tablet by mouth daily. ?  ? ?  ? ? ?No Known Allergies ? ?Consultations: ?pulmonology ? ? ?Procedures/Studies: ?DG Chest Port 1 View ? ?Result Date: 11/22/2021 ?CLINICAL DATA:  Shortness of breath and hypertension EXAM: PORTABLE CHEST 1 VIEW COMPARISON:  09/30/2021 FINDINGS:  Emphysema noted. Atherosclerotic calcification of the aortic arch. Thoracic spondylosis. Ill-defined nodularity in both mid lungs. Small pulmonary nodules cannot be readily excluded. This could be inflammatory but the ligament pulmonary nodule cannot be excluded and CT chest is recommended for further characterization. No blunting of the costophrenic angles. Heart size within normal limits. IMPRESSION: 1. Subtle mid lung nodularity bilaterally, cannot exclude inflammatory or neoplastic pulmonary nodules. Chest CT is recommended for further characterization. 2. Aortic Atherosclerosis (ICD10-I70.0) and Emphysema (ICD10-J43.9). 3. Thoracic spondylosis. Electronically Signed   By: Van Clines M.D.   On: 11/22/2021 12:37   ? ? ? ?Subjective: ?Feels that breathing is slowly improving ? ?Discharge Exam: ?Vitals:  ? 11/27/21 0814 11/27/21 0822 11/27/21 0830 11/27/21 1232  ?BP:    (!) 158/94  ?Pulse:    99  ?Resp:    20  ?Temp:    98.5 ?F (36.9 ?C)  ?TempSrc:    Oral  ?SpO2: 96% 100% 100% 94%  ?Weight:      ?Height:      ? ? ?General: Pt is alert, awake, not in acute distress ?Cardiovascular: RRR, S1/S2 +, no rubs, no gallops ?Respiratory: CTA bilaterally, no wheezing, no rhonchi ?Abdominal: Soft, NT, ND, bowel  sounds + ?Extremities: no edema, no cyanosis ? ? ? ?The results of significant diagnostics from this hospitalization (including imaging, microbiology, ancillary and laboratory) are listed below for reference.   ? ? ?Microbiology: ?Recent Results (from the past 240 hour(s))  ?Resp Panel by RT-PCR (Flu A&B, Covid) Nasopharyngeal Swab     Status: None  ? Collection Time: 11/22/21 12:02 PM  ? Specimen: Nasopharyngeal Swab; Nasopharyngeal(NP) swabs in vial transport medium  ?Result Value Ref Range Status  ? SARS Coronavirus 2 by RT PCR NEGATIVE NEGATIVE Final  ?  Comment: (NOTE) ?SARS-CoV-2 target nucleic acids are NOT DETECTED. ? ?The SARS-CoV-2 RNA is generally detectable in upper respiratory ?specimens during the  acute phase of infection. The lowest ?concentration of SARS-CoV-2 viral copies this assay can detect is ?138 copies/mL. A negative result does not preclude SARS-Cov-2 ?infection and should not be used

## 2021-12-03 ENCOUNTER — Encounter (HOSPITAL_COMMUNITY): Payer: Medicare Other

## 2021-12-03 ENCOUNTER — Other Ambulatory Visit: Payer: Self-pay

## 2021-12-03 ENCOUNTER — Ambulatory Visit (HOSPITAL_COMMUNITY)
Admission: RE | Admit: 2021-12-03 | Discharge: 2021-12-03 | Disposition: A | Discharge status: Home / Self Care | Payer: Medicare Other | Source: Ambulatory Visit | Attending: Internal Medicine | Admitting: Internal Medicine

## 2021-12-03 DIAGNOSIS — J449 Chronic obstructive pulmonary disease, unspecified: Secondary | ICD-10-CM | POA: Diagnosis not present

## 2021-12-03 LAB — PULMONARY FUNCTION TEST
DL/VA % pred: 69 %
DL/VA: 2.66 ml/min/mmHg/L
DLCO cor % pred: 55 %
DLCO cor: 14.04 ml/min/mmHg
DLCO unc % pred: 55 %
DLCO unc: 14.04 ml/min/mmHg
FEF 25-75 Post: 0.59 L/sec
FEF 25-75 Pre: 0.45 L/sec
FEF2575-%Change-Post: 32 %
FEF2575-%Pred-Post: 29 %
FEF2575-%Pred-Pre: 22 %
FEV1-%Change-Post: 10 %
FEV1-%Pred-Post: 58 %
FEV1-%Pred-Pre: 52 %
FEV1-Post: 1.76 L
FEV1-Pre: 1.59 L
FEV1FVC-%Change-Post: 6 %
FEV1FVC-%Pred-Pre: 55 %
FEV6-%Change-Post: 11 %
FEV6-%Pred-Post: 88 %
FEV6-%Pred-Pre: 79 %
FEV6-Post: 3.49 L
FEV6-Pre: 3.13 L
FEV6FVC-%Change-Post: 4 %
FEV6FVC-%Pred-Post: 89 %
FEV6FVC-%Pred-Pre: 85 %
FVC-%Change-Post: 4 %
FVC-%Pred-Post: 98 %
FVC-%Pred-Pre: 94 %
FVC-Post: 4.19 L
FVC-Pre: 4.03 L
Post FEV1/FVC ratio: 42 %
Post FEV6/FVC ratio: 83 %
Pre FEV1/FVC ratio: 39 %
Pre FEV6/FVC Ratio: 80 %
RV % pred: 156 %
RV: 4.41 L
TLC % pred: 114 %
TLC: 8.56 L

## 2021-12-03 MED ORDER — ALBUTEROL SULFATE (2.5 MG/3ML) 0.083% IN NEBU
2.5000 mg | INHALATION_SOLUTION | Freq: Once | RESPIRATORY_TRACT | Status: AC
  Administered 2021-12-03: 2.5 mg via RESPIRATORY_TRACT

## 2021-12-05 ENCOUNTER — Ambulatory Visit (INDEPENDENT_AMBULATORY_CARE_PROVIDER_SITE_OTHER): Payer: Medicare Other | Admitting: Internal Medicine

## 2021-12-05 ENCOUNTER — Inpatient Hospital Stay: Payer: Medicare Other | Admitting: Internal Medicine

## 2021-12-05 ENCOUNTER — Other Ambulatory Visit: Payer: Self-pay

## 2021-12-05 ENCOUNTER — Encounter: Payer: Self-pay | Admitting: Internal Medicine

## 2021-12-05 DIAGNOSIS — J449 Chronic obstructive pulmonary disease, unspecified: Secondary | ICD-10-CM | POA: Diagnosis not present

## 2021-12-05 DIAGNOSIS — I10 Essential (primary) hypertension: Secondary | ICD-10-CM

## 2021-12-05 NOTE — Progress Notes (Signed)
? ?Jeremy [name], male    DOB: May 17, 1936,    MRN: [id number] ? ? ?Brief patient profile:  ?53  yowm quit smoking 1990 p MI  referred to pulmonary clinic in [place]  10/13/2021 by Dr Nevada Crane for copd eval with Nov 2022 > ER with dx aecopd with GOLD 2 criteria in 2017 but not requiring any maint rx up until Nov 2022 ? ? ? ? ?History of Present Illness  ?10/13/2021  Pulmonary/ 1st office eval/ Jeremy [name] / [name] / on ACEi/breztri  ?Chief Complaint  ?Patient presents with  ? Consult  ?  Patient had covid in 2019. Patient states that he does feel better now. States that he really needed to be seen a month ago but is better now. States that he is able to walk longer distance now than before.   ?Dyspnea:  improved but still using neb each am / able [place] walk [place]. ?Much worse in am's historically but better now  ?Cough: none but still urge to clear his throat ?Sleep: bed blocks/ one pillow ?SABA use: much less now breztri one bid / maybe one albuterol hfa/ neb one each am  ?Rec ?Ok to continue lisinopril but it may be part of the problem with your throat (clearing, choking, gagging, something  stuck, problem swallowing)   ?Plan A = Automatic = Always=    Breztri Take 1-2 puffs first thing in am and then another 2 puffs about 12 hours later.  ? Work on inhaler technique:   ?Plan B = Backup (to supplement plan A, not to replace it)Only use your albuterol inhaler as a rescue medication to be used if you can't catch your breath by resting or doing a relaxed purse lip pattern.  ?Plan C = Crisis (instead of Plan B but only if Plan B stops working) ?- only use your albuterol nebulizer if you first try Plan B   ?GERD diet reviewed, bed blocks rec  ? ? ? ?Admit date: 11/22/2021 ?Discharge date: 11/27/2021 ?Discharge Condition:stable ?Discharge Diagnoses:  ?Principal Problem: ?  COPD exacerbation ([place]) ?  Essential hypertension ?  Hyperlipidemia ?  CAD (coronary artery disease) ?  COPD GOLD 2  ?  Reflux esophagitis ?   Hypokalemia ?  ?COPD exacerbation ([place]) ?Continue breztri on discharge ?Continue DuoNebs ?Treated with solumedrol, transition to prednisone taper on discharge ?Continue Mucinex ?Completed doxycycline as inpatient ?Pulmonology following, appreciate assistance ?Lisinopril discontinued in favor of losartan ?  ?Reflux esophagitis ?Continue pantoprazole twice daily ?  ? ?Essential hypertension ?Currently on losartan ?  ? ?12/05/2021 extended  post hosp  f/u ov/[place] office/Jeremy [name] re: GOLD 2 copd maint on breztri 2bid  ?Chief Complaint  ?Patient presents with  ? Hospitalization Follow-up  ?  [facility]13-[facility] for copd exacerbation. 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  ? ? ?No obvious day to day or daytime variability or assoc excess/ purulent sputum or mucus plugs or hemoptysis or cp or chest tightness, subjective wheeze or overt sinus or hb symptoms.  ? ?Sleeping as above without nocturnal  or early am exacerbation  of respiratory  c/o's or need for noct saba. Also denies any  obvious fluctuation of symptoms with weather or environmental changes or other aggravating or alleviating factors except as outlined above  ? ?No unusual exposure hx or h/o childhood pna/ asthma or knowledge of premature birth. ? ?Current Allergies, Complete Past Medical History, Past Surgical History, Family History, and Social History were reviewed in [employer] record. ? ?ROS  The following are not active complaints unless bolded ?Hoarseness, sore throat, dysphagia, dental problems, itching, sneezing,  nasal congestion or discharge of excess mucus or purulent secretions, ear ache,   fever, chills, sweats, unintended wt loss or wt gain,  classically pleuritic or exertional cp,  orthopnea pnd or arm/hand swelling  or leg swelling, presyncope, palpitations, abdominal pain, anorexia, nausea, vomiting, diarrhea  or change in bowel habits or change in bladder habits, change in stools or change in urine, dysuria, hematuria,  rash, arthralgias, visual complaints, headache, numbness, weakness or ataxia or problems with walking or coordination,  change in mood or  memory. ?      ? ?Current Meds  ?Medication Sig  ? acetaminophen (TYLENOL) 500 MG tablet Take 500-1,000 mg by mouth daily as needed for moderate pain or headache.  ? albuterol (PROVENTIL HFA;VENTOLIN HFA) 108 (90 Base) MCG/ACT inhaler Inhale 1-2 puffs into the lungs every 6 (six) hours as needed for wheezing or shortness of breath.  ? aspirin 81 MG tablet Take 1 tablet (81 mg total) by mouth every evening.  ? Budeson-Glycopyrrol-Formoterol (BREZTRI AEROSPHERE) 160-9-4.8 MCG/ACT AERO Inhale 2 puffs into the lungs 2 (two) times daily.  ? Cholecalciferol (VITAMIN D3) 50 MCG (2000 UT) CHEW Chew 1 tablet by mouth daily.  ? dextromethorphan-guaiFENesin (MUCINEX DM) 30-600 MG 12hr tablet Take 2 tablets by mouth 2 (two) times daily.  ? diphenhydrAMINE (BENADRYL) 25 MG tablet Take 12.5 mg by mouth at bedtime as needed for sleep.  ? ipratropium-albuterol (DUONEB) 0.5-2.5 (3) MG/3ML SOLN Take 3 mLs by nebulization every 4 (four) hours as needed (Shortness of breath).  ? loratadine (CLARITIN) 10 MG tablet Take 10 mg by mouth daily.  ? losartan (COZAAR) 50 MG tablet Take 1 tablet (50 mg total) by mouth daily.  ? Menthol, Topical Analgesic, (ICY HOT EX) Apply 1 application topically daily as needed (pain).  ? nitroGLYCERIN (NITROSTAT) 0.4 MG SL tablet PLACE 1 TABLET UNDER THE TONGUE EVERY 5 MINUTES FOR 3 DOSES AS NEEDED FOR CHEST PAIN (Patient taking differently: Place 0.4 mg under the tongue every 5 (five) minutes as needed for chest pain.)  ? pantoprazole (PROTONIX) 40 MG tablet TAKE 1 TABLET(40 MG) BY MOUTH  TWICE DAILY (Patient taking differently: Take 40 mg by mouth 2 (two) times daily.)  ? predniSONE (DELTASONE) 10 MG tablet Take '40mg'$  po daily for 2 days then '30mg'$  daily for 2 days then '20mg'$  daily for 2 days then '10mg'$  daily for 2 days then stop  ? simvastatin (ZOCOR) 40 MG tablet TAKE 1 TABLET(40 MG) BY MOUTH EVERY EVENING (Patient taking differently: Take 40 mg by mouth every evening.)  ? vitamin B-12 (CYANOCOBALAMIN) 100 MCG tablet Take 100 mcg by mouth daily.  ?     ?  ?   ? ?Past Medical History:  ?Diagnosis Date  ? Abnormal CXR 4/20011  ? Chest  CT mode/serere COPD ( no mediastinal abnormality)  ? CAD (coronary artery disease) 2005  ? anterior MI with stent to the LAD in 1991 / stent 1996 / nuclear 2005, no ischemia  ? Carotid bruit   ? Doppler, June, 2013, 0-39% bilateral  ?  COPD (chronic obstructive pulmonary disease) ([place])   ? Diverticula of colon   ? Ejection fraction   ? 55-60%, echo, April, 2011, could not estimate RV pressure  ? Esophagitis   ? HOH (hard of hearing)   ? Hyperlipidemia   ? Hypertension   ? Myocardial infarction [facility])   ? x2  ? Shortness of breath   ? with exertion  ? Sleep apnea   ? Stop Bang score of 5  ? Tobacco abuse   ? ?  ? ? ? ?Objective:  ?  ? ?  ?Wt Readings from Last 3 Encounters:  ?12/05/21 185 lb 9.6 oz (84.2 kg)  ?11/22/21 187 lb (84.8 kg)  ?10/13/21 187 lb (84.8 kg)  ?  ? ? ?Vital signs reviewed  12/05/2021  - Note at rest 02 sats  95% on RA  ? ?General appearance:    amb elderly wm very hoh/ no longer pseudowheezing  ? ? ?HEENT : pt wearing mask not removed for exam due to covid - 19 concerns.  ? ? ?NECK :  without JVD/Nodes/TM/ nl carotid upstrokes bilaterally ? ? ?LUNGS: no acc muscle use,  Mild barrel  contour chest wall with bilateral  Distant bs s audible wheeze and  without cough on insp or exp maneuvers  and mild  Hyperresonant  to  percussion bilaterally   ? ? ?CV:  RRR  no s3 or murmur or increase in P2, and no edema  ? ?ABD:  soft and nontender with pos end  insp  Hoover's  in the supine position. No bruits or organomegaly appreciated, bowel sounds nl ? ?MS:   Nl gait/  ext warm without deformities, calf tenderness, cyanosis or clubbing ?No obvious joint restrictions  ?

## 2021-12-05 NOTE — Assessment & Plan Note (Signed)
Off acei as of 11/22/21 due to [place]. > resolved as of 12/05/2021  ? ?Although even in retrospect it may not be clear the ACEi contributed to the pt's symptoms,  Pt improved off them and adding them back at this point or in the future would risk confusion in interpretation of non-specific respiratory symptoms to which this patient is prone  ie  Better not to muddy the waters here.  ? ?Advised to monitor bp and if over 135/90 then double the losartan to 100 mg daily until see Dr Nevada Crane.  ? ? ?F/u in 3 months then prn  ? ? ?    ?  ? ?Each maintenance medication was reviewed in detail including emphasizing most importantly the difference between maintenance and prns and under what circumstances the prns are to be triggered using an action plan format where appropriate. ? ?Total time for H and P, chart review, counseling, reviewing hfa device(s) and generating customized AVS unique to this post hosp f/u/ transition of care office visit / same day charting  > 30 min ?     ?

## 2021-12-05 NOTE — Assessment & Plan Note (Signed)
Quit smoking 1990  ?- Spirometry 11/08/15   FEV1 1.89 (58%)  Ratio 0.49 p 10% improvement from saba  ?- 10/13/2021  After extensive coaching inhaler device,  effectiveness =    75% (short Ti)  ?- d/c acei 11/22/21 > all wheeze resolve by 12/05/2021 ov ?- PFT's  12/03/21  FEV1 1.76 (58 % ) ratio 0.42  p 10 % improvement from saba p 0 prior to study with DLCO  14.04 (55%) corrects to 2.66  (69%)  for alv volume and FV curve classically concave    ? ?Marked improvement since admit which in retrospect was more pseudowheeze than true wheeze/aecopd so goal is to optimize daytime symtpoms (doe/cough) and minimize exacerbations - since not really sure this was aecopd at all and he's feeling fine ok to use breztri if limited by doe or having flare as long as understands saba is rescue rx and if needing saba then should be more consistent with breztri typical of Group D COPD symptom/risk ?

## 2021-12-05 NOTE — Patient Instructions (Addendum)
Plan A = Automatic = Always=    Breztri is up to 2 puffs every 12 hours (like high octane fuel)  ? ?Plan B = Backup (to supplement plan A, not to replace it) ?Only use your albuterol inhaler as a rescue medication to be used if you can't catch your breath by resting or doing a relaxed purse lip breathing pattern.  ?- The less you use it, the better it will work when you need it. ?- Ok to use the inhaler up to 2 puffs  every 4 hours if you must but call for appointment if use goes up over your usual need ?- Don't leave home without it !!  (think of it like starter fluid for your car)  ? ?Plan C = Crisis (instead of Plan B but only if Plan B stops working) ?- only use your albuterol nebulizer if you first try Plan B and it fails to help > ok to use the nebulizer up to every 4 hours but if start needing it regularly call for immediate appointment ? ? ?Please schedule a follow up visit in 6 months but call sooner if needed  ? ?   ?

## 2021-12-10 DIAGNOSIS — K1379 Other lesions of oral mucosa: Secondary | ICD-10-CM | POA: Diagnosis not present

## 2021-12-10 DIAGNOSIS — R350 Frequency of micturition: Secondary | ICD-10-CM | POA: Diagnosis not present

## 2021-12-16 ENCOUNTER — Telehealth: Payer: Self-pay

## 2021-12-16 ENCOUNTER — Other Ambulatory Visit: Payer: Self-pay | Admitting: Cardiology

## 2021-12-16 MED ORDER — SIMVASTATIN 40 MG PO TABS: 40.0000 mg | ORAL_TABLET | Freq: Every evening | ORAL | 0 refills | Status: DC

## 2021-12-16 NOTE — Telephone Encounter (Signed)
Refill request for Simvastatin 40 mg tablets approved and sent to [facility]  ?

## 2021-12-24 NOTE — Telephone Encounter (Signed)
Opened in error

## 2021-12-29 ENCOUNTER — Telehealth: Payer: Self-pay | Admitting: Cardiology

## 2021-12-29 NOTE — Telephone Encounter (Signed)
Notified pharm last filled 12/16/2021 with 60 tabs.  Patient last seen 12/11/2020 - patient needs OV for further refills.  She verbalized understanding.   ?

## 2021-12-29 NOTE — Telephone Encounter (Signed)
?*  STAT* If patient is at the pharmacy, call can be transferred to refill team. ? ? ?1. Which medications need to be refilled? (please list name of each medication and dose if known) simvastatin (ZOCOR) 40 MG tablet ? ?2. Which pharmacy/location (including street and city if local pharmacy) is medication to be sent to? [id number] - [place], [place] [address] ? ?3. Do they need a 30 day or 90 day supply? 90 day ? ?

## 2022-01-12 ENCOUNTER — Other Ambulatory Visit: Payer: Self-pay

## 2022-01-12 ENCOUNTER — Telehealth: Payer: Self-pay | Admitting: Internal Medicine

## 2022-01-12 MED ORDER — BREZTRI AEROSPHERE 160-9-4.8 MCG/ACT IN AERO: 2.0000 | INHALATION_SPRAY | Freq: Two times a day (BID) | RESPIRATORY_TRACT | 6 refills | Status: DC

## 2022-01-12 NOTE — Telephone Encounter (Signed)
Medication refilled. Patients wife (ok per dpr) notified. Nothing else needed.  ?

## 2022-01-16 ENCOUNTER — Other Ambulatory Visit: Payer: Self-pay | Admitting: Cardiology

## 2022-01-26 DIAGNOSIS — I10 Essential (primary) hypertension: Secondary | ICD-10-CM | POA: Diagnosis not present

## 2022-02-02 DIAGNOSIS — I10 Essential (primary) hypertension: Secondary | ICD-10-CM | POA: Diagnosis not present

## 2022-02-11 ENCOUNTER — Ambulatory Visit (INDEPENDENT_AMBULATORY_CARE_PROVIDER_SITE_OTHER): Payer: Medicare Other | Admitting: Internal Medicine

## 2022-02-11 ENCOUNTER — Encounter: Payer: Self-pay | Admitting: Internal Medicine

## 2022-02-11 DIAGNOSIS — D519 Vitamin B12 deficiency anemia, unspecified: Secondary | ICD-10-CM | POA: Diagnosis not present

## 2022-02-11 DIAGNOSIS — Z125 Encounter for screening for malignant neoplasm of prostate: Secondary | ICD-10-CM | POA: Diagnosis not present

## 2022-02-11 DIAGNOSIS — I10 Essential (primary) hypertension: Secondary | ICD-10-CM | POA: Diagnosis not present

## 2022-02-11 DIAGNOSIS — J449 Chronic obstructive pulmonary disease, unspecified: Secondary | ICD-10-CM

## 2022-02-11 NOTE — Patient Instructions (Addendum)
Plan A = Automatic = Always=    Breztri  2 in am and the pm doses are optional   Plan B = Backup (to supplement plan A, not to replace it) Only use your albuterol inhaler as a rescue medication to be used if you can't catch your breath by resting or doing a relaxed purse lip breathing pattern.  - The less you use it, the better it will work when you need it. - Ok to use the inhaler up to 2 puffs  every 4 hours if you must but call for appointment if use goes up over your usual need - Don't leave home without it !!  (think of it like the spare tire for your car)   Plan C = Crisis (instead of Plan B but only if Plan B stops working) - only use your albuterol nebulizer if you first try Plan B and it fails to help > ok to use the nebulizer up to every 4 hours but if start needing it regularly call for immediate appointment   Please schedule a follow up visit in 6  months but call sooner if needed

## 2022-02-11 NOTE — Progress Notes (Unsigned)
Jeremy [name], male    DOB: 06-24-1936,    MRN: [id number]   Brief patient profile:  84  yowm quit smoking 1990 p MI  referred to pulmonary clinic in [name]  10/13/2021 by Dr Jeremy [name] for copd eval with Nov 2022 > ER with dx aecopd with GOLD 2 criteria in 2017 but not requiring any maint rx up until Nov 2022     History of Present Illness  10/13/2021  Pulmonary/ 1st [name] eval/ Jeremy [name] / Jeremy [name] [name] / on ACEi/breztri  Chief Complaint  Patient presents with   Consult    Patient had covid in 2019. Patient states that he does feel better now. States that he really needed to be seen a month ago but is better now. States that he is able to walk longer distance now than before.   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 Much worse in am's historically but better now  Cough: none but still urge to clear his throat Sleep: bed blocks/ one pillow SABA use: much less now breztri one bid / maybe one albuterol hfa/ neb one each am  Rec Ok to continue lisinopril but it may be part of the problem with your throat (clearing, choking, gagging, something  stuck, problem swallowing)   Plan A = Automatic = Always=    Breztri Take 1-2 puffs first thing in am and then another 2 puffs about 12 hours later.   Work on inhaler technique:   Plan B = Backup (to supplement plan A, not to replace it)Only use your albuterol inhaler as a rescue medication to be used if you can't catch your breath by resting or doing a relaxed purse lip pattern.  Plan C = Crisis (instead of Plan B but only if Plan B stops working) - only use your albuterol nebulizer if you first try Plan B   GERD diet reviewed, bed blocks rec     Admit date: 11/22/2021 Discharge date: 11/27/2021 Discharge Condition:stable Discharge Diagnoses:  Principal Problem:   COPD exacerbation (Jeremy [name])   Essential hypertension   Hyperlipidemia   CAD (coronary artery disease)   COPD GOLD 2    Reflux esophagitis    Hypokalemia   COPD exacerbation (Jeremy [name]) Continue breztri on discharge Continue DuoNebs Treated with solumedrol, transition to prednisone taper on discharge Continue Mucinex Completed doxycycline as inpatient Pulmonology following, appreciate assistance Lisinopril discontinued in favor of losartan   Reflux esophagitis Continue pantoprazole twice daily    Essential hypertension Currently on losartan    12/05/2021 extended  post hosp  f/u ov/Jeremy [name]/Jeremy [name] re: GOLD 2 copd maint on breztri 2bid  Chief Complaint  Patient presents with   Hospitalization Follow-up    [facility] admission 3/13-3/16 for copd exacerbation. 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 but hasn't started and not using breztri consistently at all  Cough: better off acei  Sleeping: bed risers SABA use: not using anything at all  02: none  Covid status: vax x 1  Rec Plan A = Automatic = Always=    Breztri is up to 2 puffs every 12 hours (like high octane fuel)  Plan B = Backup (to supplement plan A, not to replace it) Only use your albuterol inhaler as a rescue medication  Plan C = Crisis (instead of Plan B but only if Plan B stops  working) - only use your albuterol nebulizer if you first try Plan B        02/11/2022  f/u ov/Jeremy [name]/[name] re: copd   maint on breztri 2 bid   Chief Complaint  Patient presents with   Follow-up    Breathing about the same since last ov.    Dyspnea:  out doing some farm work  Cough: none  Sleeping: risers/ sleeping ok  SABA use: none now  02: none  Covid status: vax x one / infected x one      No obvious day to day or daytime variability or assoc excess/ purulent sputum or mucus plugs or hemoptysis or cp or chest tightness, subjective wheeze or overt sinus or hb symptoms.   *** without nocturnal  or early am exacerbation  of respiratory  c/o's or need for noct saba. Also  denies any obvious fluctuation of symptoms with weather or environmental changes or other aggravating or alleviating factors except as outlined above   No unusual exposure hx or h/o childhood pna/ asthma or knowledge of premature birth.  Current Allergies, Complete Past Medical History, Past Surgical History, Family History, and Social History were reviewed in [employer] record.  ROS  The following are not active complaints unless bolded Hoarseness, sore throat, dysphagia, dental problems, itching, sneezing,  nasal congestion or discharge of excess mucus or purulent secretions, ear ache,   fever, chills, sweats, unintended wt loss or wt gain, classically pleuritic or exertional cp,  orthopnea pnd or arm/hand swelling  or leg swelling, presyncope, palpitations, abdominal pain, anorexia, nausea, vomiting, diarrhea  or change in bowel habits or change in bladder habits, change in stools or change in urine, dysuria, hematuria,  rash, arthralgias, visual complaints, headache, numbness, weakness or ataxia or problems with walking or coordination,  change in mood or  memory.        Current Meds  Medication Sig   acetaminophen (TYLENOL) 500 MG tablet Take 500-1,000 mg by mouth daily as needed for moderate pain or headache.   albuterol (PROVENTIL HFA;VENTOLIN HFA) 108 (90 Base) MCG/ACT inhaler Inhale 1-2 puffs into the lungs every 6 (six) hours as needed for wheezing or shortness of breath.   amLODipine (NORVASC) 10 MG tablet Take 10 mg by mouth daily.   aspirin 81 MG tablet Take 1 tablet (81 mg total) by mouth every evening.   Budeson-Glycopyrrol-Formoterol (BREZTRI AEROSPHERE) 160-9-4.8 MCG/ACT AERO Inhale 2 puffs into the lungs 2 (two) times daily.   Cholecalciferol (VITAMIN D3) 50 MCG (2000 UT) CHEW Chew 1 tablet by mouth daily.   dextromethorphan-guaiFENesin (MUCINEX DM) 30-600 MG 12hr tablet Take 2 tablets by mouth 2 (two) times daily.   diphenhydrAMINE (BENADRYL) 25 MG tablet  Take 12.5 mg by mouth at bedtime as needed for sleep.   ipratropium-albuterol (DUONEB) 0.5-2.5 (3) MG/3ML SOLN Take 3 mLs by nebulization every 4 (four) hours as needed (Shortness of breath).   loratadine (CLARITIN) 10 MG tablet Take 10 mg by mouth daily.   losartan (COZAAR) 50 MG tablet Take 1 tablet (50 mg total) by mouth daily. (Patient taking differently: Take 100 mg by mouth daily.)   Menthol, Topical Analgesic, (ICY HOT EX) Apply 1 application topically daily as needed (pain).   nitroGLYCERIN (NITROSTAT) 0.4 MG SL tablet PLACE 1 TABLET UNDER THE TONGUE EVERY 5 MINUTES FOR 3 DOSES AS NEEDED FOR CHEST PAIN (Patient taking differently: Place 0.4 mg under the tongue every 5 (five) minutes as needed for chest pain.)  pantoprazole (PROTONIX) 40 MG tablet TAKE 1 TABLET(40 MG) BY MOUTH TWICE DAILY   simvastatin (ZOCOR) 40 MG tablet Take 1 tablet (40 mg total) by mouth every evening.            Past Medical History:  Diagnosis Date   Abnormal CXR 4/20011   Chest  CT mode/serere COPD ( no mediastinal abnormality)   CAD (coronary artery disease) 2005   anterior MI with stent to the LAD in 1991 / stent 1996 / nuclear 2005, no ischemia   Carotid bruit    Doppler, June, 2013, 0-39% bilateral   COPD (chronic obstructive pulmonary disease) ([place])    Diverticula of colon    Ejection fraction    55-60%, echo, April, 2011, could not estimate RV pressure   Esophagitis    HOH (hard of hearing)    Hyperlipidemia    Hypertension    Myocardial infarction [facility])    x2   Shortness of breath    with exertion   Sleep apnea    Stop Bang score of 5   Tobacco abuse         Objective:       02/11/2022        ***   12/05/21 185 lb 9.6 oz (84.2 kg)  11/22/21 187 lb (84.8 kg)  10/13/21 187 lb (84.8 kg)      Vital signs reviewed  02/11/2022  - Note at rest 02 sats  ***% on ***   General appearance:    ***    Mild bar***                Assessment

## 2022-02-12 ENCOUNTER — Encounter: Payer: Self-pay | Admitting: Internal Medicine

## 2022-02-12 NOTE — Assessment & Plan Note (Addendum)
Off acei as of 11/22/21 due to Abrams. > resolved as of 12/05/2021   Continues to do better off acei permanently given the overlap with aecopd symptoms > Follow up per Primary Care planned            Each maintenance medication was reviewed in detail including emphasizing most importantly the difference between maintenance and prns and under what circumstances the prns are to be triggered using an action plan format where appropriate.  Total time for H and P, chart review, counseling, reviewing hfa device(s) and generating customized AVS unique to this office visit / same day charting = 22 min

## 2022-02-12 NOTE — Assessment & Plan Note (Signed)
Quit smoking 1990  - Spirometry 11/08/15   FEV1 1.89 (58%)  Ratio 0.49 p 10% improvement from saba  - 10/13/2021  After extensive coaching inhaler device,  effectiveness =    75% (short Ti)  - d/c acei 11/22/21 > all wheeze resolve by 12/05/2021 ov - PFT's  12/03/21  FEV1 1.76 (58 % ) ratio 0.42  p 10 % improvement from saba p 0 prior to study with DLCO  14.04 (55%) corrects to 2.66  (69%)  for alv volume and FV curve classically concave      Group D (now reclassified as E) in terms of symptom/risk and laba/lama/ICS  therefore appropriate rx at this point >>>  breztri and approp saba  F/u q 6 m, sooner if needed

## 2022-02-13 DIAGNOSIS — I10 Essential (primary) hypertension: Secondary | ICD-10-CM | POA: Diagnosis not present

## 2022-02-15 ENCOUNTER — Other Ambulatory Visit: Payer: Self-pay | Admitting: Cardiology

## 2022-02-16 ENCOUNTER — Other Ambulatory Visit: Payer: Self-pay

## 2022-02-16 DIAGNOSIS — N1831 Chronic kidney disease, stage 3a: Secondary | ICD-10-CM | POA: Diagnosis not present

## 2022-02-16 DIAGNOSIS — J309 Allergic rhinitis, unspecified: Secondary | ICD-10-CM | POA: Diagnosis not present

## 2022-02-16 DIAGNOSIS — R5383 Other fatigue: Secondary | ICD-10-CM | POA: Diagnosis not present

## 2022-02-16 DIAGNOSIS — J449 Chronic obstructive pulmonary disease, unspecified: Secondary | ICD-10-CM | POA: Diagnosis not present

## 2022-02-16 DIAGNOSIS — I10 Essential (primary) hypertension: Secondary | ICD-10-CM | POA: Diagnosis not present

## 2022-02-16 DIAGNOSIS — E782 Mixed hyperlipidemia: Secondary | ICD-10-CM | POA: Diagnosis not present

## 2022-02-16 DIAGNOSIS — E663 Overweight: Secondary | ICD-10-CM | POA: Diagnosis not present

## 2022-02-16 DIAGNOSIS — E539 Vitamin B deficiency, unspecified: Secondary | ICD-10-CM | POA: Diagnosis not present

## 2022-02-16 DIAGNOSIS — E876 Hypokalemia: Secondary | ICD-10-CM | POA: Diagnosis not present

## 2022-02-16 DIAGNOSIS — Z0001 Encounter for general adult medical examination with abnormal findings: Secondary | ICD-10-CM | POA: Diagnosis not present

## 2022-02-16 DIAGNOSIS — Z6827 Body mass index (BMI) 27.0-27.9, adult: Secondary | ICD-10-CM | POA: Diagnosis not present

## 2022-02-27 ENCOUNTER — Other Ambulatory Visit: Payer: Self-pay | Admitting: Cardiology

## 2022-03-02 DIAGNOSIS — R5383 Other fatigue: Secondary | ICD-10-CM | POA: Diagnosis not present

## 2022-03-02 DIAGNOSIS — I10 Essential (primary) hypertension: Secondary | ICD-10-CM | POA: Diagnosis not present

## 2022-03-02 DIAGNOSIS — E559 Vitamin D deficiency, unspecified: Secondary | ICD-10-CM | POA: Diagnosis not present

## 2022-03-26 ENCOUNTER — Encounter: Payer: Self-pay | Admitting: Cardiology

## 2022-03-26 ENCOUNTER — Ambulatory Visit (INDEPENDENT_AMBULATORY_CARE_PROVIDER_SITE_OTHER): Payer: Medicare Other | Admitting: Cardiology

## 2022-03-26 VITALS — BP 100/60 | HR 72 | Ht 73.0 in | Wt 191.0 lb

## 2022-03-26 DIAGNOSIS — E782 Mixed hyperlipidemia: Secondary | ICD-10-CM | POA: Diagnosis not present

## 2022-03-26 DIAGNOSIS — I2510 Atherosclerotic heart disease of native coronary artery without angina pectoris: Secondary | ICD-10-CM | POA: Diagnosis not present

## 2022-03-26 DIAGNOSIS — I10 Essential (primary) hypertension: Secondary | ICD-10-CM

## 2022-03-26 DIAGNOSIS — R0602 Shortness of breath: Secondary | ICD-10-CM | POA: Diagnosis not present

## 2022-03-26 NOTE — Progress Notes (Signed)
Clinical Summary Jeremy [name] is a 86 y.o.male seen for the following medical problems.    1. CAD - history of prior stenting to LAD - nuclear stress 03/2014 with no ischemia. LVEF 47%.  - admit 04/2016 with chest pain, negative EKG and enzymes. Symptoms better with belching, thought GI etiology. He had been off his protonix at the time. Symptoms improved after restarting.        - DOE with activities.  - no recent edema - no recent chest pain   2. COPD - PFTs 10/2015 with moderate COPD 11/2021 admission with COPD exacerbation  - SOB x 6-8 months - compliant with inahlers.    3. Hyperlipidemia - Has been on zocor for several years 03/2018 TC 129 HDL TG 76 LDL 75   -recent labs with pcp     4. History of GI bleed - admit 11/2017 with rectal bleeding. EGD showed esophagitis, gastritis but no active bleeding.    5. HTN - compliant with meds     6. Leg pain - no pain at rest. 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 Past Medical History:  Diagnosis Date   Abnormal CXR 4/20011   Chest  CT mode/serere COPD ( no mediastinal abnormality)   CAD (coronary artery disease) 2005   anterior MI with stent to the LAD in 1991 / stent 1996 / nuclear 2005, no ischemia   Carotid bruit    Doppler, June, 2013, 0-39% bilateral   COPD (chronic obstructive pulmonary disease) ([place])    Diverticula of colon    Ejection fraction    55-60%, echo, April, 2011, could not estimate RV pressure   Esophagitis    HOH (hard of hearing)    Hyperlipidemia    Hypertension    Myocardial infarction ([place])    x2   Shortness of breath    with exertion   Sleep apnea    Stop Bang score of 5   Tobacco abuse      No Known Allergies   Current Outpatient Medications  Medication Sig Dispense Refill   simvastatin (ZOCOR) 40 MG tablet TAKE 1 TABLET(40 MG) BY MOUTH  EVERY EVENING 90 tablet 0   acetaminophen (TYLENOL) 500 MG tablet Take 500-1,000 mg by mouth daily as needed for moderate pain or headache.     albuterol (PROVENTIL HFA;VENTOLIN HFA) 108 (90 Base) MCG/ACT inhaler Inhale 1-2 puffs into the lungs every 6 (six) hours as needed for wheezing or shortness of breath.     amLODipine (NORVASC) 10 MG tablet Take 10 mg by mouth daily.     aspirin 81 MG tablet Take 1 tablet (81 mg total) by mouth every evening. 30 tablet    Budeson-Glycopyrrol-Formoterol (BREZTRI AEROSPHERE) 160-9-4.8 MCG/ACT AERO Inhale 2 puffs into the lungs 2 (two) times daily. 10.7 g 6   Cholecalciferol (VITAMIN D3) 50 MCG (2000 UT) CHEW Chew 1 tablet by mouth daily.     dextromethorphan-guaiFENesin (MUCINEX DM) 30-600 MG 12hr tablet Take 2 tablets by mouth 2 (two) times daily. 30 tablet 0   diphenhydrAMINE (BENADRYL) 25 MG tablet Take 12.5 mg by mouth at bedtime as needed for sleep.     ipratropium-albuterol (DUONEB) 0.5-2.5 (3) MG/3ML SOLN Take 3 mLs by nebulization every 4 (four) hours as needed (Shortness of breath).     loratadine (CLARITIN)  10 MG tablet Take 10 mg by mouth daily.     losartan (COZAAR) 50 MG tablet Take 1 tablet (50 mg total) by mouth daily. (Patient taking differently: Take 100 mg by mouth daily.) 30 tablet 1   Menthol, Topical Analgesic, (ICY HOT EX) Apply 1 application topically daily as needed (pain).     nitroGLYCERIN (NITROSTAT) 0.4 MG SL tablet PLACE 1 TABLET UNDER THE TONGUE EVERY 5 MINUTES FOR 3 DOSES AS NEEDED FOR CHEST PAIN (Patient taking differently: Place 0.4 mg under the tongue every 5 (five) minutes as needed for chest pain.) 75 tablet 3   pantoprazole (PROTONIX) 40 MG tablet TAKE 1 TABLET(40 MG) BY MOUTH TWICE DAILY 60 tablet 1   No current facility-administered medications for this visit.     Past Surgical History:  Procedure Laterality Date   APPENDECTOMY     BIOPSY  11/17/2017   Procedure: BIOPSY;  Surgeon: Rogene Houston, MD;  Location: [facility]  ENDO SUITE;  Service: Endoscopy;;  gastric   CARDIAC CATHETERIZATION     CATARACT EXTRACTION Left    CATARACT EXTRACTION W/PHACO Right 01/22/2014   Procedure: CATARACT EXTRACTION PHACO AND INTRAOCULAR LENS PLACEMENT ([place]);  Surgeon: Tonny Johniece Hornbaker, MD;  Location: [facility] ORS;  Service: Ophthalmology;  Laterality: Right;  CDE 7.46   CHOLECYSTECTOMY     8 years ago [facility]   COLONOSCOPY  10/05/2012   Dr. Laural Golden: multiple diverticula at sigmoid colon, multiple polyps (tubular adenomas), small external hemorrhoids   COLONOSCOPY N/A 11/18/2017   Procedure: COLONOSCOPY;  Surgeon: Rogene Houston, MD;  Location: [facility];  Service: Endoscopy;  Laterality: N/A;   COLONOSCOPY WITH PROPOFOL N/A 05/07/2021   Procedure: COLONOSCOPY WITH PROPOFOL;  Surgeon: Rogene Houston, MD;  Location: [facility];  Service: Endoscopy;  Laterality: N/A;   CYST REMOVAL HAND     right wrist   ESOPHAGEAL DILATION N/A 05/23/2015   Procedure: ESOPHAGEAL DILATION;  Surgeon: Daneil Dolin, MD;  Location: [facility];  Service: Endoscopy;  Laterality: N/A;   ESOPHAGEAL DILATION N/A 11/17/2017   Procedure: ESOPHAGEAL DILATION;  Surgeon: Rogene Houston, MD;  Location[facility];  Service: Endoscopy;  Laterality: N/A;   ESOPHAGEAL DILATION N/A 05/07/2021   Procedure: ESOPHAGEAL DILATION;  Surgeon: Rogene Houston, MD;  Location[facility];  Service: Endoscopy;  Laterality: N/A;   ESOPHAGOGASTRODUODENOSCOPY N/A 05/23/2015   Dr. Gala Romney: Ulcerative/ erosive reflux esophagitis with peptic stricture formation. Status post dilation as described. Hiatal hernia. Abnormal gastric mucosa of uncertain significance status post gastric biospy. +H.pylori   ESOPHAGOGASTRODUODENOSCOPY N/A 11/17/2017   Procedure: ESOPHAGOGASTRODUODENOSCOPY (EGD);  Surgeon: Rogene Houston, MD;  [facility];  Service: Endoscopy;  Laterality: N/A;   ESOPHAGOGASTRODUODENOSCOPY (EGD) WITH PROPOFOL N/A 05/07/2021   Procedure: ESOPHAGOGASTRODUODENOSCOPY (EGD)  WITH PROPOFOL;  Surgeon: Rogene Houston, MD;  Location: [facility];  Service: Endoscopy;  Laterality: N/A;   PERCUTANEOUS STENT INTERVENTION     SKIN CANCER EXCISION  2015   ear & neck   TONSILLECTOMY       No Known Allergies    Family History  Problem Relation Age of Onset   Heart disease Mother    Heart disease Father    Heart disease Brother    Stomach cancer Brother    Colon cancer Neg Hx      Social History Mr. Mcglasson reports that he quit smoking about 33 years ago. His smoking use included cigarettes. He started smoking about 38 years ago.  He has a 36.00 pack-year smoking history. His smokeless tobacco use includes chew. Mr. Voris reports no history of alcohol use.   Review of Systems CONSTITUTIONAL: No weight loss, fever, chills, weakness or fatigue.  HEENT: Eyes: No visual loss, blurred vision, double vision or yellow sclerae.No hearing loss, sneezing, congestion, runny nose or sore throat.  SKIN: No rash or itching.  CARDIOVASCULAR: per hpi RESPIRATORY: per hpi GASTROINTESTINAL: No anorexia, nausea, vomiting or diarrhea. No abdominal pain or blood.  GENITOURINARY: No burning on urination, no polyuria NEUROLOGICAL: No headache, dizziness, syncope, paralysis, ataxia, numbness or tingling in the extremities. No change in bowel or bladder control.  MUSCULOSKELETAL: per hpi LYMPHATICS: No enlarged nodes. No history of splenectomy.  PSYCHIATRIC: No history of depression or anxiety.  ENDOCRINOLOGIC: No reports of sweating, cold or heat intolerance. No polyuria or polydipsia.  Marland Kitchen   Physical Examination Today's Vitals   03/26/22 0930  BP: 100/60  Pulse: 72  SpO2: 94%  Weight: 191 lb (86.6 kg)  Height: '6\' 1"'$  (1.854 m)   Body mass index is 25.2 kg/m.  Gen: resting comfortably, no acute distress HEENT: no scleral icterus, pupils equal round and reactive, no palptable cervical adenopathy,  CV: RRR, no m/r/g no jvd Resp: Clear to auscultation bilaterally GI:  abdomen is soft, non-tender, non-distended, normal bowel sounds, no hepatosplenomegaly MSK: extremities are warm, no edema.  Skin: warm, no rash Neuro:  no focal deficits Psych: appropriate affect   Diagnostic Studies     Assessment and Plan   1. CAD - no recent chest pains - ongoing DOE being managed for COPD however ongoing symptoms - several years since cardiac studies. Will obtain echo for ongoing DOE, pending results consider exercise nuclear stress test.    2. Hyperlipidemia - continue simvastatin, request labs from pcp     3. HTN -he is at goal, continue current meds    Arnoldo Lenis, M.D

## 2022-03-26 NOTE — Patient Instructions (Signed)
Medication Instructions:    Your physician recommends that you continue on your current medications as directed. Please refer to the Current Medication list given to you today.  Labwork:  None  Testing/Procedures: Your physician has requested that you have an echocardiogram. Echocardiography is a painless test that uses sound waves to create images of your heart. It provides your doctor with information about the size and shape of your heart and how well your heart's chambers and valves are working. This procedure takes approximately one hour. There are no restrictions for this procedure.  Follow-Up:  Your physician recommends that you schedule a follow-up appointment in: 6 months.  Any Other Special Instructions Will Be Listed Below (If Applicable).  If you need a refill on your cardiac medications before your next appointment, please call your pharmacy. 

## 2022-03-31 ENCOUNTER — Ambulatory Visit: Payer: Medicare Other

## 2022-04-01 ENCOUNTER — Ambulatory Visit: Payer: Medicare Other

## 2022-04-01 DIAGNOSIS — R0602 Shortness of breath: Secondary | ICD-10-CM

## 2022-04-01 LAB — ECHOCARDIOGRAM COMPLETE
Area-P 1/2: 2.02 cm2
Calc EF: 60.2 %
MV M vel: 1.57 m/s
MV Peak grad: 9.9 mmHg
S' Lateral: 2.36 cm
Single Plane A2C EF: 59.7 %
Single Plane A4C EF: 61.1 %

## 2022-04-16 ENCOUNTER — Other Ambulatory Visit: Payer: Self-pay | Admitting: Cardiology

## 2022-04-22 ENCOUNTER — Telehealth: Payer: Self-pay | Admitting: *Deleted

## 2022-04-22 NOTE — Telephone Encounter (Signed)
-----   Message from Arnoldo Lenis, MD sent at 04/20/2022  1:16 PM EDT ----- Echo shows normal heart pumping function, very mild age related stiffness of heart muscle which is considered a minor findings. Overall echo looks good   Zandra Abts MD

## 2022-04-22 NOTE — Telephone Encounter (Signed)
Laurine Blazer, LPN  03/14/2786 18:36 AM EDT Back to Top    Notified, copy to pcp.

## 2022-04-24 ENCOUNTER — Other Ambulatory Visit (HOSPITAL_COMMUNITY): Payer: Self-pay | Admitting: Family Medicine

## 2022-04-24 ENCOUNTER — Ambulatory Visit (HOSPITAL_COMMUNITY)
Admission: RE | Admit: 2022-04-24 | Discharge: 2022-04-24 | Disposition: A | Discharge status: Home / Self Care | Payer: Medicare Other | Source: Ambulatory Visit | Attending: Family Medicine | Admitting: Family Medicine

## 2022-04-24 DIAGNOSIS — M25552 Pain in left hip: Secondary | ICD-10-CM | POA: Diagnosis not present

## 2022-04-24 DIAGNOSIS — M79604 Pain in right leg: Secondary | ICD-10-CM

## 2022-04-24 DIAGNOSIS — M79605 Pain in left leg: Secondary | ICD-10-CM | POA: Diagnosis not present

## 2022-04-24 DIAGNOSIS — M791 Myalgia, unspecified site: Secondary | ICD-10-CM | POA: Diagnosis not present

## 2022-04-24 DIAGNOSIS — M25551 Pain in right hip: Secondary | ICD-10-CM | POA: Diagnosis not present

## 2022-05-01 ENCOUNTER — Ambulatory Visit (HOSPITAL_COMMUNITY)
Admission: RE | Admit: 2022-05-01 | Discharge: 2022-05-01 | Disposition: A | Discharge status: Home / Self Care | Payer: Medicare Other | Source: Ambulatory Visit | Attending: Family Medicine | Admitting: Family Medicine

## 2022-05-01 DIAGNOSIS — M79605 Pain in left leg: Secondary | ICD-10-CM | POA: Diagnosis not present

## 2022-05-01 DIAGNOSIS — M79604 Pain in right leg: Secondary | ICD-10-CM | POA: Diagnosis not present

## 2022-05-01 DIAGNOSIS — M79662 Pain in left lower leg: Secondary | ICD-10-CM | POA: Diagnosis not present

## 2022-05-01 DIAGNOSIS — M79661 Pain in right lower leg: Secondary | ICD-10-CM | POA: Diagnosis not present

## 2022-05-20 DIAGNOSIS — I10 Essential (primary) hypertension: Secondary | ICD-10-CM | POA: Diagnosis not present

## 2022-05-20 DIAGNOSIS — E539 Vitamin B deficiency, unspecified: Secondary | ICD-10-CM | POA: Diagnosis not present

## 2022-05-20 DIAGNOSIS — D519 Vitamin B12 deficiency anemia, unspecified: Secondary | ICD-10-CM | POA: Diagnosis not present

## 2022-05-25 ENCOUNTER — Ambulatory Visit (INDEPENDENT_AMBULATORY_CARE_PROVIDER_SITE_OTHER): Payer: Medicare Other | Admitting: Orthopedic Surgery

## 2022-05-25 ENCOUNTER — Ambulatory Visit (INDEPENDENT_AMBULATORY_CARE_PROVIDER_SITE_OTHER): Payer: Medicare Other

## 2022-05-25 ENCOUNTER — Encounter: Payer: Self-pay | Admitting: Orthopedic Surgery

## 2022-05-25 VITALS — BP 122/84 | HR 98 | Ht 72.0 in | Wt 190.4 lb

## 2022-05-25 DIAGNOSIS — M48062 Spinal stenosis, lumbar region with neurogenic claudication: Secondary | ICD-10-CM | POA: Diagnosis not present

## 2022-05-25 DIAGNOSIS — I2510 Atherosclerotic heart disease of native coronary artery without angina pectoris: Secondary | ICD-10-CM

## 2022-05-25 DIAGNOSIS — M545 Low back pain, unspecified: Secondary | ICD-10-CM

## 2022-05-25 NOTE — Progress Notes (Signed)
Chief Complaint  Patient presents with   New Patient (Initial Visit)   Back Pain    LBP//radiates down into thighs Has a hard time walking and is unable to stand for an extended period    HPI: 86 year old male presents with lower back pain and bilateral hip pain occasional pain in his thighs difficulty standing for any longer than 10 minutes and difficulty walking  Patient did take some Tylenol which relieved his pain about 25% and increase his walking distance slightly  Symptoms seem to begin after a bout with COVID in 2019  Past Medical History:  Diagnosis Date   Abnormal CXR 4/20011   Chest  CT mode/serere COPD ( no mediastinal abnormality)   CAD (coronary artery disease) 2005   anterior MI with stent to the LAD in 1991 / stent 1996 / nuclear 2005, no ischemia   Carotid bruit    Doppler, June, 2013, 0-39% bilateral   COPD (chronic obstructive pulmonary disease) ([place])    Diverticula of colon    Ejection fraction    55-60%, echo, April, 2011, could not estimate RV pressure   Esophagitis    HOH (hard of hearing)    Hyperlipidemia    Hypertension    Myocardial infarction ([place])    x2   Shortness of breath    with exertion   Sleep apnea    Stop Bang score of 5   Tobacco abuse     BP 122/84   Pulse 98   Ht 6' (1.829 m)   Wt 190 lb 6.4 oz (86.4 kg)   BMI 25.82 kg/m    General appearance: Well-developed well-nourished no gross deformities  Cardiovascular normal pulse and perfusion normal color without edema  Neurologically no sensation loss or deficits or pathologic reflexes 2+ reflex at the knee 0-1 at the ankle  Psychological: Awake alert and oriented x3 mood and affect normal  Skin no lacerations or ulcerations no nodularity no palpable masses, no erythema or nodularity  Musculoskeletal:   Ambulates without support does have some crouching in the knees and flexion in the pelvis  No atrophy noted.  Hip range of motion normal without pain.  Today back  nontender.  Imaging outside imaging included x-rays of his hip on the left with no fracture or dislocation no significant degenerative arthritis  Images lumbar spine were done in the office today extensive spondylosis lumbar  A/P   Spinal stenosis  Recommend physical therapy and continue with Tylenol for pain  If no improvement patient will need to MRI and referral to neurosurgery for surgery consideration.  At 9 with history of coronary artery disease probably unlikely he would be surgical candidate

## 2022-05-25 NOTE — Patient Instructions (Signed)
Physical therapy has been ordered for you at [facility] They should call you to schedule, [phone]  is the phone number to call, if you want to call to schedule.   

## 2022-05-26 ENCOUNTER — Other Ambulatory Visit: Payer: Self-pay | Admitting: Cardiology

## 2022-05-27 DIAGNOSIS — N1831 Chronic kidney disease, stage 3a: Secondary | ICD-10-CM | POA: Diagnosis not present

## 2022-05-27 DIAGNOSIS — I10 Essential (primary) hypertension: Secondary | ICD-10-CM | POA: Diagnosis not present

## 2022-05-27 DIAGNOSIS — M4800 Spinal stenosis, site unspecified: Secondary | ICD-10-CM | POA: Diagnosis not present

## 2022-05-27 DIAGNOSIS — E539 Vitamin B deficiency, unspecified: Secondary | ICD-10-CM | POA: Diagnosis not present

## 2022-05-27 DIAGNOSIS — E782 Mixed hyperlipidemia: Secondary | ICD-10-CM | POA: Diagnosis not present

## 2022-05-27 DIAGNOSIS — E663 Overweight: Secondary | ICD-10-CM | POA: Diagnosis not present

## 2022-05-27 DIAGNOSIS — J309 Allergic rhinitis, unspecified: Secondary | ICD-10-CM | POA: Diagnosis not present

## 2022-05-27 DIAGNOSIS — J449 Chronic obstructive pulmonary disease, unspecified: Secondary | ICD-10-CM | POA: Diagnosis not present

## 2022-05-27 DIAGNOSIS — R5383 Other fatigue: Secondary | ICD-10-CM | POA: Diagnosis not present

## 2022-05-27 DIAGNOSIS — E876 Hypokalemia: Secondary | ICD-10-CM | POA: Diagnosis not present

## 2022-05-27 DIAGNOSIS — Z6828 Body mass index (BMI) 28.0-28.9, adult: Secondary | ICD-10-CM | POA: Diagnosis not present

## 2022-06-03 ENCOUNTER — Encounter: Payer: Self-pay | Admitting: *Deleted

## 2022-06-05 DIAGNOSIS — M62552 Muscle wasting and atrophy, not elsewhere classified, left thigh: Secondary | ICD-10-CM | POA: Diagnosis not present

## 2022-06-05 DIAGNOSIS — R269 Unspecified abnormalities of gait and mobility: Secondary | ICD-10-CM | POA: Diagnosis not present

## 2022-06-05 DIAGNOSIS — M2569 Stiffness of other specified joint, not elsewhere classified: Secondary | ICD-10-CM | POA: Diagnosis not present

## 2022-06-05 DIAGNOSIS — M5416 Radiculopathy, lumbar region: Secondary | ICD-10-CM | POA: Diagnosis not present

## 2022-06-05 DIAGNOSIS — M62551 Muscle wasting and atrophy, not elsewhere classified, right thigh: Secondary | ICD-10-CM | POA: Diagnosis not present

## 2022-06-10 DIAGNOSIS — M5416 Radiculopathy, lumbar region: Secondary | ICD-10-CM | POA: Diagnosis not present

## 2022-06-10 DIAGNOSIS — M62552 Muscle wasting and atrophy, not elsewhere classified, left thigh: Secondary | ICD-10-CM | POA: Diagnosis not present

## 2022-06-10 DIAGNOSIS — M62551 Muscle wasting and atrophy, not elsewhere classified, right thigh: Secondary | ICD-10-CM | POA: Diagnosis not present

## 2022-06-10 DIAGNOSIS — M2569 Stiffness of other specified joint, not elsewhere classified: Secondary | ICD-10-CM | POA: Diagnosis not present

## 2022-06-10 DIAGNOSIS — R269 Unspecified abnormalities of gait and mobility: Secondary | ICD-10-CM | POA: Diagnosis not present

## 2022-06-12 DIAGNOSIS — M5416 Radiculopathy, lumbar region: Secondary | ICD-10-CM | POA: Diagnosis not present

## 2022-06-12 DIAGNOSIS — R269 Unspecified abnormalities of gait and mobility: Secondary | ICD-10-CM | POA: Diagnosis not present

## 2022-06-12 DIAGNOSIS — M62552 Muscle wasting and atrophy, not elsewhere classified, left thigh: Secondary | ICD-10-CM | POA: Diagnosis not present

## 2022-06-12 DIAGNOSIS — M62551 Muscle wasting and atrophy, not elsewhere classified, right thigh: Secondary | ICD-10-CM | POA: Diagnosis not present

## 2022-06-12 DIAGNOSIS — M2569 Stiffness of other specified joint, not elsewhere classified: Secondary | ICD-10-CM | POA: Diagnosis not present

## 2022-06-17 DIAGNOSIS — M62552 Muscle wasting and atrophy, not elsewhere classified, left thigh: Secondary | ICD-10-CM | POA: Diagnosis not present

## 2022-06-17 DIAGNOSIS — M62551 Muscle wasting and atrophy, not elsewhere classified, right thigh: Secondary | ICD-10-CM | POA: Diagnosis not present

## 2022-06-17 DIAGNOSIS — R269 Unspecified abnormalities of gait and mobility: Secondary | ICD-10-CM | POA: Diagnosis not present

## 2022-06-17 DIAGNOSIS — M2569 Stiffness of other specified joint, not elsewhere classified: Secondary | ICD-10-CM | POA: Diagnosis not present

## 2022-06-17 DIAGNOSIS — M5416 Radiculopathy, lumbar region: Secondary | ICD-10-CM | POA: Diagnosis not present

## 2022-06-19 DIAGNOSIS — R600 Localized edema: Secondary | ICD-10-CM | POA: Diagnosis not present

## 2022-06-19 DIAGNOSIS — M62552 Muscle wasting and atrophy, not elsewhere classified, left thigh: Secondary | ICD-10-CM | POA: Diagnosis not present

## 2022-06-19 DIAGNOSIS — N1831 Chronic kidney disease, stage 3a: Secondary | ICD-10-CM | POA: Diagnosis not present

## 2022-06-19 DIAGNOSIS — E876 Hypokalemia: Secondary | ICD-10-CM | POA: Diagnosis not present

## 2022-06-19 DIAGNOSIS — M2569 Stiffness of other specified joint, not elsewhere classified: Secondary | ICD-10-CM | POA: Diagnosis not present

## 2022-06-19 DIAGNOSIS — M62551 Muscle wasting and atrophy, not elsewhere classified, right thigh: Secondary | ICD-10-CM | POA: Diagnosis not present

## 2022-06-19 DIAGNOSIS — I10 Essential (primary) hypertension: Secondary | ICD-10-CM | POA: Diagnosis not present

## 2022-06-19 DIAGNOSIS — E663 Overweight: Secondary | ICD-10-CM | POA: Diagnosis not present

## 2022-06-19 DIAGNOSIS — M5416 Radiculopathy, lumbar region: Secondary | ICD-10-CM | POA: Diagnosis not present

## 2022-06-19 DIAGNOSIS — R269 Unspecified abnormalities of gait and mobility: Secondary | ICD-10-CM | POA: Diagnosis not present

## 2022-06-19 DIAGNOSIS — N401 Enlarged prostate with lower urinary tract symptoms: Secondary | ICD-10-CM | POA: Diagnosis not present

## 2022-06-19 DIAGNOSIS — Z6829 Body mass index (BMI) 29.0-29.9, adult: Secondary | ICD-10-CM | POA: Diagnosis not present

## 2022-06-22 ENCOUNTER — Other Ambulatory Visit (HOSPITAL_COMMUNITY): Payer: Self-pay | Admitting: Internal Medicine

## 2022-06-22 DIAGNOSIS — N1831 Chronic kidney disease, stage 3a: Secondary | ICD-10-CM

## 2022-06-23 DIAGNOSIS — M2569 Stiffness of other specified joint, not elsewhere classified: Secondary | ICD-10-CM | POA: Diagnosis not present

## 2022-06-23 DIAGNOSIS — M62551 Muscle wasting and atrophy, not elsewhere classified, right thigh: Secondary | ICD-10-CM | POA: Diagnosis not present

## 2022-06-23 DIAGNOSIS — M62552 Muscle wasting and atrophy, not elsewhere classified, left thigh: Secondary | ICD-10-CM | POA: Diagnosis not present

## 2022-06-23 DIAGNOSIS — M5416 Radiculopathy, lumbar region: Secondary | ICD-10-CM | POA: Diagnosis not present

## 2022-06-23 DIAGNOSIS — R269 Unspecified abnormalities of gait and mobility: Secondary | ICD-10-CM | POA: Diagnosis not present

## 2022-06-25 DIAGNOSIS — M2569 Stiffness of other specified joint, not elsewhere classified: Secondary | ICD-10-CM | POA: Diagnosis not present

## 2022-06-25 DIAGNOSIS — M5416 Radiculopathy, lumbar region: Secondary | ICD-10-CM | POA: Diagnosis not present

## 2022-06-25 DIAGNOSIS — M62551 Muscle wasting and atrophy, not elsewhere classified, right thigh: Secondary | ICD-10-CM | POA: Diagnosis not present

## 2022-06-25 DIAGNOSIS — R269 Unspecified abnormalities of gait and mobility: Secondary | ICD-10-CM | POA: Diagnosis not present

## 2022-06-25 DIAGNOSIS — M62552 Muscle wasting and atrophy, not elsewhere classified, left thigh: Secondary | ICD-10-CM | POA: Diagnosis not present

## 2022-06-30 DIAGNOSIS — R269 Unspecified abnormalities of gait and mobility: Secondary | ICD-10-CM | POA: Diagnosis not present

## 2022-06-30 DIAGNOSIS — M62552 Muscle wasting and atrophy, not elsewhere classified, left thigh: Secondary | ICD-10-CM | POA: Diagnosis not present

## 2022-06-30 DIAGNOSIS — M2569 Stiffness of other specified joint, not elsewhere classified: Secondary | ICD-10-CM | POA: Diagnosis not present

## 2022-06-30 DIAGNOSIS — M5416 Radiculopathy, lumbar region: Secondary | ICD-10-CM | POA: Diagnosis not present

## 2022-06-30 DIAGNOSIS — M62551 Muscle wasting and atrophy, not elsewhere classified, right thigh: Secondary | ICD-10-CM | POA: Diagnosis not present

## 2022-07-01 ENCOUNTER — Ambulatory Visit (HOSPITAL_COMMUNITY)
Admission: RE | Admit: 2022-07-01 | Discharge: 2022-07-01 | Disposition: A | Discharge status: Home / Self Care | Payer: Medicare Other | Source: Ambulatory Visit | Attending: Internal Medicine | Admitting: Internal Medicine

## 2022-07-01 DIAGNOSIS — N1831 Chronic kidney disease, stage 3a: Secondary | ICD-10-CM | POA: Insufficient documentation

## 2022-07-01 DIAGNOSIS — N281 Cyst of kidney, acquired: Secondary | ICD-10-CM | POA: Diagnosis not present

## 2022-07-07 DIAGNOSIS — M2569 Stiffness of other specified joint, not elsewhere classified: Secondary | ICD-10-CM | POA: Diagnosis not present

## 2022-07-07 DIAGNOSIS — M5416 Radiculopathy, lumbar region: Secondary | ICD-10-CM | POA: Diagnosis not present

## 2022-07-07 DIAGNOSIS — M62551 Muscle wasting and atrophy, not elsewhere classified, right thigh: Secondary | ICD-10-CM | POA: Diagnosis not present

## 2022-07-07 DIAGNOSIS — R269 Unspecified abnormalities of gait and mobility: Secondary | ICD-10-CM | POA: Diagnosis not present

## 2022-07-07 DIAGNOSIS — M62552 Muscle wasting and atrophy, not elsewhere classified, left thigh: Secondary | ICD-10-CM | POA: Diagnosis not present

## 2022-07-15 DIAGNOSIS — M2569 Stiffness of other specified joint, not elsewhere classified: Secondary | ICD-10-CM | POA: Diagnosis not present

## 2022-07-15 DIAGNOSIS — R269 Unspecified abnormalities of gait and mobility: Secondary | ICD-10-CM | POA: Diagnosis not present

## 2022-07-15 DIAGNOSIS — M62552 Muscle wasting and atrophy, not elsewhere classified, left thigh: Secondary | ICD-10-CM | POA: Diagnosis not present

## 2022-07-15 DIAGNOSIS — M62551 Muscle wasting and atrophy, not elsewhere classified, right thigh: Secondary | ICD-10-CM | POA: Diagnosis not present

## 2022-07-15 DIAGNOSIS — M5416 Radiculopathy, lumbar region: Secondary | ICD-10-CM | POA: Diagnosis not present

## 2022-07-23 ENCOUNTER — Other Ambulatory Visit (HOSPITAL_COMMUNITY): Payer: Self-pay | Admitting: Family Medicine

## 2022-07-23 DIAGNOSIS — Z23 Encounter for immunization: Secondary | ICD-10-CM | POA: Diagnosis not present

## 2022-07-23 DIAGNOSIS — R93429 Abnormal radiologic findings on diagnostic imaging of unspecified kidney: Secondary | ICD-10-CM | POA: Diagnosis not present

## 2022-07-23 DIAGNOSIS — R600 Localized edema: Secondary | ICD-10-CM | POA: Diagnosis not present

## 2022-07-28 DIAGNOSIS — F410 Panic disorder [episodic paroxysmal anxiety] without agoraphobia: Secondary | ICD-10-CM | POA: Diagnosis not present

## 2022-07-28 DIAGNOSIS — J309 Allergic rhinitis, unspecified: Secondary | ICD-10-CM | POA: Diagnosis not present

## 2022-07-28 DIAGNOSIS — H1013 Acute atopic conjunctivitis, bilateral: Secondary | ICD-10-CM | POA: Diagnosis not present

## 2022-08-01 DIAGNOSIS — E663 Overweight: Secondary | ICD-10-CM | POA: Diagnosis not present

## 2022-08-01 DIAGNOSIS — H01009 Unspecified blepharitis unspecified eye, unspecified eyelid: Secondary | ICD-10-CM | POA: Diagnosis not present

## 2022-08-01 DIAGNOSIS — Z6826 Body mass index (BMI) 26.0-26.9, adult: Secondary | ICD-10-CM | POA: Diagnosis not present

## 2022-08-05 ENCOUNTER — Encounter: Payer: Self-pay | Admitting: Internal Medicine

## 2022-08-05 ENCOUNTER — Ambulatory Visit (INDEPENDENT_AMBULATORY_CARE_PROVIDER_SITE_OTHER): Payer: Medicare Other | Admitting: Internal Medicine

## 2022-08-05 VITALS — BP 132/74 | HR 82 | Temp 98.4°F | Ht 72.0 in | Wt 193.4 lb

## 2022-08-05 DIAGNOSIS — J449 Chronic obstructive pulmonary disease, unspecified: Secondary | ICD-10-CM | POA: Diagnosis not present

## 2022-08-05 DIAGNOSIS — I2510 Atherosclerotic heart disease of native coronary artery without angina pectoris: Secondary | ICD-10-CM

## 2022-08-05 NOTE — Progress Notes (Unsigned)
Jeremy [name], male    DOB: 1936-07-22,    MRN: [id number]   Brief patient profile:  89  yowm quit smoking 1990 p MI  referred to pulmonary clinic in [place]  10/13/2021 by Dr Nevada Crane for copd eval with Nov 2022 > ER with dx aecopd with GOLD 2 criteria in 2017 but not requiring any maint rx up until Nov 2022     History of Present Illness  10/13/2021  Pulmonary/ 1st office eval/ [facility] / [name] / on ACEi/breztri  Chief Complaint  Patient presents with   Consult    Patient had covid in 2019. Patient states that he does feel better now. States that he really needed to be seen a month ago but is better now. States that he is able to walk longer distance now than before.   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 Much worse in am's historically but better now  Cough: none but still urge to clear his throat Sleep: bed blocks/ one pillow SABA use: much less now breztri one bid / maybe one albuterol hfa/ neb one each am  Rec Ok to continue lisinopril but it may be part of the problem with your throat (clearing, choking, gagging, something  stuck, problem swallowing)   Plan A = Automatic = Always=    Breztri Take 1-2 puffs first thing in am and then another 2 puffs about 12 hours later.   Work on inhaler technique:   Plan B = Backup (to supplement plan A, not to replace it)Only use your albuterol inhaler as a rescue medication to be used if you can't catch your breath by resting or doing a relaxed purse lip pattern.  Plan C = Crisis (instead of Plan B but only if Plan B stops working) - only use your albuterol nebulizer if you first try Plan B   GERD diet reviewed, bed blocks rec    Admit date: 11/22/2021 Discharge date: 11/27/2021 Discharge Condition:stable Discharge Diagnoses:  Principal Problem:   COPD exacerbation ([place])   Essential hypertension   Hyperlipidemia   CAD (coronary artery disease)   COPD GOLD 2    Reflux esophagitis    Hypokalemia   COPD exacerbation (HCC) vs ACEi case  Continue breztri on discharge Continue DuoNebs Treated with solumedrol, transition to prednisone taper on discharge Continue Mucinex Completed doxycycline as inpatient Pulmonology following, appreciate assistance Lisinopril discontinued in favor of losartan   02/11/2022  f/u ov/[place]/Rhianne Soman re: copd gold 2   maint on breztri 2 bid  and off acei  Chief Complaint  Patient presents with   Follow-up    Breathing about the same since last ov.    Dyspnea:  out doing some farm work  Cough: none  Sleeping: risers/ sleeping ok  SABA use: none now  02: none  Covid status: vax x one / infected x one Rec Plan A = Automatic = Always=  Breztri  2 in am and the pm doses are optional  Plan B = Backup (to supplement plan A, not to replace it) Only use your albuterol inhaler as a rescue medication Plan C = Crisis (instead of Plan B but only if Plan B stops working) - only use your albuterol nebulizer if you first try Plan B     08/05/2022  f/u [name]/Fairley Copher re: [name] maint on Breztri  Chief Complaint  Patient presents with   Follow-up    Breathing  about the same since last ov   Dyspnea:  no change doe but legs swelling more  Cough: none  Sleeping: sleeping on bed with risers ok  SABA use: not needing  02: none   No obvious day to day or daytime variability or assoc excess/ purulent sputum or mucus plugs or hemoptysis or cp or chest tightness, subjective wheeze or overt sinus or hb symptoms.   Sleeping  without nocturnal  or early am exacerbation  of respiratory  c/o's or need for noct saba. Also denies any obvious fluctuation of symptoms with weather or environmental changes or other aggravating or alleviating factors except as outlined above   No unusual exposure hx or h/o childhood pna/ asthma or knowledge of premature birth.  Current Allergies, Complete Past Medical History, Past Surgical History, Family  History, and Social History were reviewed in [employer] record.  ROS  The following are not active complaints unless bolded Hoarseness, sore throat, dysphagia, dental problems, itching, sneezing,  nasal congestion or discharge of excess mucus or purulent secretions, ear ache,   fever, chills, sweats, unintended wt loss or wt gain, classically pleuritic or exertional cp,  orthopnea pnd or arm/hand swelling  or leg swelling, presyncope, palpitations, abdominal pain, anorexia, nausea, vomiting, diarrhea  or change in bowel habits or change in bladder habits, change in stools or change in urine, dysuria, hematuria,  rash, arthralgias, visual complaints, headache, numbness, weakness or ataxia or problems with walking or coordination,  change in mood or  memory.        Current Meds  Medication Sig   albuterol (PROVENTIL HFA;VENTOLIN HFA) 108 (90 Base) MCG/ACT inhaler Inhale 1-2 puffs into the lungs every 6 (six) hours as needed for wheezing or shortness of breath.   amLODipine (NORVASC) 10 MG tablet Take 10 mg by mouth daily.   aspirin 81 MG tablet Take 1 tablet (81 mg total) by mouth every evening.   aspirin-acetaminophen-caffeine (EXCEDRIN MIGRAINE) 250-250-65 MG tablet Take 1 tablet by mouth every 6 (six) hours as needed for headache.   Budeson-Glycopyrrol-Formoterol (BREZTRI AEROSPHERE) 160-9-4.8 MCG/ACT AERO Inhale 2 puffs into the lungs 2 (two) times daily.   Cholecalciferol (VITAMIN D3) 50 MCG (2000 UT) CHEW Chew 1 tablet by mouth daily.   cyanocobalamin 1000 MCG tablet Take 1,000 mcg by mouth daily.   dextromethorphan-guaiFENesin (MUCINEX DM) 30-600 MG 12hr tablet Take 2 tablets by mouth 2 (two) times daily.   diphenhydrAMINE (BENADRYL) 25 MG tablet Take 12.5 mg by mouth at bedtime as needed for sleep.   hydrochlorothiazide (HYDRODIURIL) 12.5 MG tablet Take 12.5 mg by mouth daily.   ipratropium-albuterol (DUONEB) 0.5-2.5 (3) MG/3ML SOLN Take 3 mLs by nebulization every 4  (four) hours as needed (Shortness of breath).   levocetirizine (XYZAL) 5 MG tablet Take 5 mg by mouth every evening.   loratadine (CLARITIN) 10 MG tablet Take 10 mg by mouth daily as needed.   losartan (COZAAR) 100 MG tablet Take 100 mg by mouth daily.   Menthol, Topical Analgesic, (ICY HOT EX) Apply 1 application topically daily as needed (pain).   nitroGLYCERIN (NITROSTAT) 0.4 MG SL tablet PLACE 1 TABLET UNDER THE TONGUE EVERY 5 MINUTES FOR 3 DOSES AS NEEDED FOR CHEST PAIN (Patient taking differently: Place 0.4 mg under the tongue every 5 (five) minutes as needed for chest pain.)   pantoprazole (PROTONIX) 40 MG tablet TAKE 1 TABLET(40 MG) BY MOUTH TWICE DAILY   potassium chloride (KLOR-CON) 10 MEQ tablet Take 10 mEq by mouth daily.  simvastatin (ZOCOR) 40 MG tablet TAKE 1 TABLET(40 MG) BY MOUTH EVERY EVENING          Past Medical History:  Diagnosis Date   Abnormal CXR 4/20011   Chest  CT mode/serere COPD ( no mediastinal abnormality)   CAD (coronary artery disease) 2005   anterior MI with stent to the LAD in 1991 / stent 1996 / nuclear 2005, no ischemia   Carotid bruit    Doppler, June, 2013, 0-39% bilateral   COPD (chronic obstructive pulmonary disease) ([place])    Diverticula of colon    Ejection fraction    55-60%, echo, April, 2011, could not estimate RV pressure   Esophagitis    HOH (hard of hearing)    Hyperlipidemia    Hypertension    Myocardial infarction ([place])    x2   Shortness of breath    with exertion   Sleep apnea    Stop Bang score of 5   Tobacco abuse         Objective:      08/05/2022    193 02/11/2022       187   12/05/21 185 lb 9.6 oz (84.2 kg)  11/22/21 187 lb (84.8 kg)  10/13/21 187 lb (84.8 kg)     Vital signs reviewed  08/05/2022  - Note at rest 02 sats  94% on RA   General appearance:    amb pleasant wm nad    HEENT : Oropharynx  clear       NECK :  without  apparent JVD/ palpable Nodes/TM    LUNGS: no acc muscle use,  Mild barrel   contour chest wall with bilateral  Distant bs s audible wheeze and  without cough on insp or exp maneuvers  and mild  Hyperresonant  to  percussion bilaterally     CV:  RRR  no s3 or murmur or increase in P2, and trace bilateral ankle edema  ABD: obese  soft and nontender with pos end  insp Hoover's  in the supine position.  No bruits or organomegaly appreciated   MS:  Nl gait/ ext warm without deformities Or obvious joint restrictions  calf tenderness, cyanosis or clubbing     SKIN: warm and dry without lesions    NEURO:  alert, approp, nl sensorium with  no motor or cerebellar deficits apparent.                Assessment

## 2022-08-05 NOTE — Patient Instructions (Addendum)
No change in medications - your insurance company may provide you with a list of alternatives if Jeremy [name] becomes too pricey - please bring it in to next visit to review.  Please schedule a follow up visit in 6 months but call sooner if needed

## 2022-08-06 ENCOUNTER — Encounter: Payer: Self-pay | Admitting: Internal Medicine

## 2022-08-06 NOTE — Assessment & Plan Note (Addendum)
Quit smoking 1990  - Spirometry 11/08/15   FEV1 1.89 (58%)  Ratio 0.49 p 10% improvement from saba  - 10/13/2021  After extensive coaching inhaler device,  effectiveness =    75% (short Ti)  - d/c acei 11/22/21 > all wheeze resolve by 12/05/2021 ov - PFT's  12/03/21  FEV1 1.76 (58 % ) ratio 0.42  p 10 % improvement from saba p 0 prior to study with DLCO  14.04 (55%) corrects to 2.66  (69%)  for alv volume and FV curve classically concave    - 08/05/2022  After extensive coaching inhaler device,  effectiveness =    80% short Ti   Group D (now reclassified as E) in terms of symptom/risk and laba/lama/ICS  therefore appropriate rx at this point >>>  breztri up to 2 bid (pt taking less due to cost but symptoms well controlled and no aecopd or excess use of saba so that's fine with me)  Advised:  formulary restrictions will be an ongoing challenge for the forseable future and I would be happy to pick an alternative if the pt will first  provide me a list of them -  pt  will need to return here for training for any new device that is required eg dpi vs hfa vs respimat.    In the meantime we can always provide samples so that the patient never runs out of any needed respiratory medications.           Each maintenance medication was reviewed in detail including emphasizing most importantly the difference between maintenance and prns and under what circumstances the prns are to be triggered using an action plan format where appropriate.  Total time for H and P, chart review, counseling, reviewing hfa device(s) and generating customized AVS unique to this office visit / same day charting = 21 min

## 2022-08-14 ENCOUNTER — Ambulatory Visit (HOSPITAL_COMMUNITY)
Admission: RE | Admit: 2022-08-14 | Discharge: 2022-08-14 | Disposition: A | Discharge status: Home / Self Care | Payer: Medicare Other | Source: Ambulatory Visit | Attending: Family Medicine | Admitting: Family Medicine

## 2022-08-14 DIAGNOSIS — I700 Atherosclerosis of aorta: Secondary | ICD-10-CM | POA: Diagnosis not present

## 2022-08-14 DIAGNOSIS — K5730 Diverticulosis of large intestine without perforation or abscess without bleeding: Secondary | ICD-10-CM | POA: Diagnosis not present

## 2022-08-14 DIAGNOSIS — K769 Liver disease, unspecified: Secondary | ICD-10-CM | POA: Diagnosis not present

## 2022-08-14 DIAGNOSIS — R93429 Abnormal radiologic findings on diagnostic imaging of unspecified kidney: Secondary | ICD-10-CM | POA: Diagnosis not present

## 2022-08-14 DIAGNOSIS — N281 Cyst of kidney, acquired: Secondary | ICD-10-CM | POA: Diagnosis not present

## 2022-08-14 MED ORDER — GADOBUTROL 1 MMOL/ML IV SOLN
9.0000 mL | Freq: Once | INTRAVENOUS | Status: AC | PRN
  Administered 2022-08-14: 9 mL via INTRAVENOUS

## 2022-09-02 DIAGNOSIS — E539 Vitamin B deficiency, unspecified: Secondary | ICD-10-CM | POA: Diagnosis not present

## 2022-09-02 DIAGNOSIS — I10 Essential (primary) hypertension: Secondary | ICD-10-CM | POA: Diagnosis not present

## 2022-09-02 DIAGNOSIS — R5383 Other fatigue: Secondary | ICD-10-CM | POA: Diagnosis not present

## 2022-09-07 DIAGNOSIS — M5459 Other low back pain: Secondary | ICD-10-CM | POA: Diagnosis not present

## 2022-09-07 DIAGNOSIS — R1011 Right upper quadrant pain: Secondary | ICD-10-CM | POA: Diagnosis not present

## 2022-09-07 DIAGNOSIS — N281 Cyst of kidney, acquired: Secondary | ICD-10-CM | POA: Diagnosis not present

## 2022-09-07 DIAGNOSIS — Z7982 Long term (current) use of aspirin: Secondary | ICD-10-CM | POA: Diagnosis not present

## 2022-09-07 DIAGNOSIS — M545 Low back pain, unspecified: Secondary | ICD-10-CM | POA: Diagnosis not present

## 2022-09-07 DIAGNOSIS — R109 Unspecified abdominal pain: Secondary | ICD-10-CM | POA: Diagnosis not present

## 2022-09-07 DIAGNOSIS — N200 Calculus of kidney: Secondary | ICD-10-CM | POA: Diagnosis not present

## 2022-09-08 ENCOUNTER — Emergency Department (HOSPITAL_COMMUNITY): Payer: Medicare Other

## 2022-09-08 ENCOUNTER — Encounter (HOSPITAL_COMMUNITY): Payer: Self-pay | Admitting: Emergency Medicine

## 2022-09-08 ENCOUNTER — Other Ambulatory Visit: Payer: Self-pay

## 2022-09-08 ENCOUNTER — Emergency Department (HOSPITAL_COMMUNITY)
Admission: EM | Admit: 2022-09-08 | Discharge: 2022-09-08 | Disposition: A | Discharge status: Home / Self Care | Payer: Medicare Other | Attending: Emergency Medicine | Admitting: Emergency Medicine

## 2022-09-08 DIAGNOSIS — M545 Low back pain, unspecified: Secondary | ICD-10-CM

## 2022-09-08 DIAGNOSIS — R109 Unspecified abdominal pain: Secondary | ICD-10-CM | POA: Diagnosis not present

## 2022-09-08 DIAGNOSIS — N200 Calculus of kidney: Secondary | ICD-10-CM | POA: Diagnosis not present

## 2022-09-08 DIAGNOSIS — N281 Cyst of kidney, acquired: Secondary | ICD-10-CM | POA: Diagnosis not present

## 2022-09-08 LAB — URINALYSIS, ROUTINE W REFLEX MICROSCOPIC
Bilirubin Urine: NEGATIVE
Glucose, UA: NEGATIVE mg/dL
Hgb urine dipstick: NEGATIVE
Ketones, ur: NEGATIVE mg/dL
Leukocytes,Ua: NEGATIVE
Nitrite: NEGATIVE
Protein, ur: NEGATIVE mg/dL
Specific Gravity, Urine: 1.018 (ref 1.005–1.030)
pH: 6 (ref 5.0–8.0)

## 2022-09-08 LAB — COMPREHENSIVE METABOLIC PANEL
ALT: 11 U/L (ref 0–44)
AST: 16 U/L (ref 15–41)
Albumin: 4 g/dL (ref 3.5–5.0)
Alkaline Phosphatase: 97 U/L (ref 38–126)
Anion gap: 6 (ref 5–15)
BUN: 13 mg/dL (ref 8–23)
CO2: 28 mmol/L (ref 22–32)
Calcium: 9 mg/dL (ref 8.9–10.3)
Chloride: 110 mmol/L (ref 98–111)
Creatinine, Ser: 0.99 mg/dL (ref 0.61–1.24)
GFR, Estimated: >60 mL/min (ref >60)
Glucose, Bld: 121 mg/dL — ABNORMAL HIGH (ref 70–99)
Potassium: 3.3 mmol/L — ABNORMAL LOW (ref 3.5–5.1)
Sodium: 144 mmol/L (ref 135–145)
Total Bilirubin: 0.5 mg/dL (ref 0.3–1.2)
Total Protein: 7.1 g/dL (ref 6.5–8.1)

## 2022-09-08 LAB — CBC WITH DIFFERENTIAL/PLATELET
Abs Immature Granulocytes: 0.03 10*3/uL (ref 0.00–0.07)
Basophils Absolute: 0 10*3/uL (ref 0.0–0.1)
Basophils Relative: 0 %
Eosinophils Absolute: 0.1 10*3/uL (ref 0.0–0.5)
Eosinophils Relative: 1 %
HCT: 41.9 % (ref 39.0–52.0)
Hemoglobin: 13.9 g/dL (ref 13.0–17.0)
Immature Granulocytes: 1 %
Lymphocytes Relative: 10 %
Lymphs Abs: 0.6 10*3/uL — ABNORMAL LOW (ref 0.7–4.0)
MCH: 28.3 pg (ref 26.0–34.0)
MCHC: 33.2 g/dL (ref 30.0–36.0)
MCV: 85.3 fL (ref 80.0–100.0)
Monocytes Absolute: 0.3 10*3/uL (ref 0.1–1.0)
Monocytes Relative: 5 %
Neutro Abs: 5.4 10*3/uL (ref 1.7–7.7)
Neutrophils Relative %: 83 %
Platelets: 184 10*3/uL (ref 150–400)
RBC: 4.91 MIL/uL (ref 4.22–5.81)
RDW: 13.4 % (ref 11.5–15.5)
WBC: 6.5 10*3/uL (ref 4.0–10.5)
nRBC: 0 % (ref 0.0–0.2)

## 2022-09-08 LAB — LIPASE, BLOOD: Lipase: 31 U/L (ref 11–51)

## 2022-09-08 MED ORDER — TIZANIDINE HCL 2 MG PO TABS: 2.0000 mg | ORAL_TABLET | Freq: Four times a day (QID) | ORAL | 0 refills | Status: DC | PRN

## 2022-09-08 MED ORDER — METHOCARBAMOL 500 MG PO TABS
500.0000 mg | ORAL_TABLET | Freq: Once | ORAL | Status: AC
  Administered 2022-09-08: 500 mg via ORAL
  Filled 2022-09-08: qty 1

## 2022-09-08 MED ORDER — IBUPROFEN 600 MG PO TABS: 600.0000 mg | ORAL_TABLET | Freq: Four times a day (QID) | ORAL | 0 refills | Status: DC | PRN

## 2022-09-08 MED ORDER — IOHEXOL 300 MG/ML  SOLN
100.0000 mL | Freq: Once | INTRAMUSCULAR | Status: AC | PRN
  Administered 2022-09-08: 100 mL via INTRAVENOUS

## 2022-09-08 MED ORDER — FENTANYL CITRATE PF 50 MCG/ML IJ SOSY
50.0000 ug | PREFILLED_SYRINGE | Freq: Once | INTRAMUSCULAR | Status: AC
  Administered 2022-09-08: 50 ug via INTRAVENOUS
  Filled 2022-09-08: qty 1

## 2022-09-08 MED ORDER — IBUPROFEN 400 MG PO TABS
600.0000 mg | ORAL_TABLET | Freq: Once | ORAL | Status: AC
  Administered 2022-09-08: 600 mg via ORAL
  Filled 2022-09-08: qty 2

## 2022-09-08 NOTE — ED Provider Notes (Signed)
[facility] EMERGENCY DEPARTMENT  Provider Note  CSN: [id number] Arrival date & time: 09/07/22 2203  History Chief Complaint  Patient presents with   Back Pain    Jeremy [name] is a 86 y.o. male presents for evaluation of R lower back pain for several days. He has not had any falls or injuries. Reports pain 'moves around' and that he has had some abdominal discomfort that feels like gas. He has not had any nausea, vomiting or fever. No dysuria or hematuria. Remote history of renal stones that felt different. Recently had a renal US that was concerning for a mass, follow up MRI 12/1 was unremarkable. No stones seen then either.    Home Medications Prior to Admission medications   Medication Sig Start Date End Date Taking? Authorizing Provider  ibuprofen (ADVIL) 600 MG tablet Take 1 tablet (600 mg total) by mouth every 6 (six) hours as needed. 09/08/22  Yes Truddie Hidden, MD  tiZANidine (ZANAFLEX) 2 MG tablet Take 1 tablet (2 mg total) by mouth every 6 (six) hours as needed for muscle spasms. 09/08/22  Yes Truddie Hidden, MD  albuterol (PROVENTIL HFA;VENTOLIN HFA) 108 (90 Base) MCG/ACT inhaler Inhale 1-2 puffs into the lungs every 6 (six) hours as needed for wheezing or shortness of breath.    [provider]  amLODipine (NORVASC) 10 MG tablet Take 10 mg by mouth daily. 01/26/22   [provider]  aspirin 81 MG tablet Take 1 tablet (81 mg total) by mouth every evening. 05/09/21   Regalado, Belkys A, MD  aspirin-acetaminophen-caffeine (EXCEDRIN MIGRAINE) 250-250-65 MG tablet Take 1 tablet by mouth every 6 (six) hours as needed for headache.    [provider]  Budeson-Glycopyrrol-Formoterol (BREZTRI AEROSPHERE) 160-9-4.8 MCG/ACT AERO Inhale 2 puffs into the lungs 2 (two) times daily. 01/12/22   Tanda Rockers, MD  Cholecalciferol (VITAMIN D3) 50 MCG (2000 UT) CHEW Chew 1 tablet by mouth daily.    [provider]  cyanocobalamin 1000 MCG tablet Take  1,000 mcg by mouth daily.    [provider]  dextromethorphan-guaiFENesin (MUCINEX DM) 30-600 MG 12hr tablet Take 2 tablets by mouth 2 (two) times daily. 11/27/21   Kathie Dike, MD  diphenhydrAMINE (BENADRYL) 25 MG tablet Take 12.5 mg by mouth at bedtime as needed for sleep.    [provider]  hydrochlorothiazide (HYDRODIURIL) 12.5 MG tablet Take 12.5 mg by mouth daily. 03/02/22   [provider]  ipratropium-albuterol (DUONEB) 0.5-2.5 (3) MG/3ML SOLN Take 3 mLs by nebulization every 4 (four) hours as needed (Shortness of breath). 09/18/21   [provider]  levocetirizine (XYZAL) 5 MG tablet Take 5 mg by mouth every evening.    [provider]  loratadine (CLARITIN) 10 MG tablet Take 10 mg by mouth daily as needed.    [provider]  losartan (COZAAR) 100 MG tablet Take 100 mg by mouth daily. 01/02/22   [provider]  Menthol, Topical Analgesic, (ICY HOT EX) Apply 1 application topically daily as needed (pain).    [provider]  nitroGLYCERIN (NITROSTAT) 0.4 MG SL tablet PLACE 1 TABLET UNDER THE TONGUE EVERY 5 MINUTES FOR 3 DOSES AS NEEDED FOR CHEST PAIN Patient taking differently: Place 0.4 mg under the tongue every 5 (five) minutes as needed for chest pain. 08/11/16   Arnoldo Lenis, MD  pantoprazole (PROTONIX) 40 MG tablet TAKE 1 TABLET(40 MG) BY MOUTH TWICE DAILY 04/16/22   Arnoldo Lenis, MD  potassium chloride (  KLOR-CON) 10 MEQ tablet Take 10 mEq by mouth daily. 03/18/22   [provider]  simvastatin (ZOCOR) 40 MG tablet TAKE 1 TABLET(40 MG) BY MOUTH EVERY EVENING 05/26/22   Arnoldo Lenis, MD     Allergies    Patient has no known allergies.   Review of Systems   Review of Systems Please see HPI for pertinent positives and negatives  Physical Exam BP (!) 161/91   Pulse 73   Temp 98.4 F (36.9 C)   Resp (!) 25   Ht 6' (1.829 m)   Wt 83.9 kg   SpO2 98%   BMI 25.09 kg/m   Physical  Exam Vitals and nursing note reviewed.  Constitutional:      Appearance: Normal appearance.  HENT:     Head: Normocephalic and atraumatic.     Nose: Nose normal.     Mouth/Throat:     Mouth: Mucous membranes are moist.  Eyes:     Extraocular Movements: Extraocular movements intact.     Conjunctiva/sclera: Conjunctivae normal.  Cardiovascular:     Rate and Rhythm: Normal rate.  Pulmonary:     Effort: Pulmonary effort is normal.     Breath sounds: Normal breath sounds.  Abdominal:     General: Abdomen is flat.     Palpations: Abdomen is soft.     Tenderness: There is no abdominal tenderness (mild, diffuse worse in RUQ). There is no guarding.  Musculoskeletal:        General: No swelling or tenderness (no tenderness over his area of concern). Normal range of motion.     Cervical back: Neck supple.  Skin:    General: Skin is warm and dry.     Findings: No rash.  Neurological:     General: No focal deficit present.     Mental Status: He is alert.  Psychiatric:        Mood and Affect: Mood normal.     ED Results / Procedures / Treatments   EKG None  Procedures Procedures  Medications Ordered in the ED Medications  fentaNYL (SUBLIMAZE) injection 50 mcg (50 mcg Intravenous Given 09/08/22 0141)  iohexol (OMNIPAQUE) 300 MG/ML solution 100 mL (100 mLs Intravenous Contrast Given 09/08/22 0209)  ibuprofen (ADVIL) tablet 600 mg (600 mg Oral Given 09/08/22 0350)  methocarbamol (ROBAXIN) tablet 500 mg (500 mg Oral Given 09/08/22 0350)    Initial Impression and Plan  Patient here with back pain and some vague abdominal discomfort. Exam is neg for point tenderness. He believes his pain is from 'gas'. UA done in triage is clear. Will add abdominal labs and send for CT to rule out other causes such as biliary disease. Recent MRI neg for AAA, so that is not likely.   ED Course   Clinical Course as of 09/08/22 0508  Tue Sep 08, 2022  0155 CBC is normal.  [CS]  0219 CMP and lipase  are normal.  [CS]  0349 I personally viewed the images from radiology studies and agree with radiologist interpretation: CT is neg for acute process to explain his pain. Pain is improved but not resolved. Plan discharge with Rx for motrin and tizanidine for probable MSK pain. PCP follow up.  [CS]    Clinical Course User Index [CS] Truddie Hidden, MD     MDM Rules/Calculators/A&P Medical Decision Making Problems Addressed: Acute right-sided low back pain without sciatica: acute illness or injury  Amount and/or Complexity of Data Reviewed Labs: ordered. Decision-making details documented in  ED Course. Radiology: ordered and independent interpretation performed. Decision-making details documented in ED Course.  Risk Prescription drug management. Parenteral controlled substances.    Final Clinical Impression(s) / ED Diagnoses Final diagnoses:  Acute right-sided low back pain without sciatica    Rx / DC Orders ED Discharge Orders          Ordered    ibuprofen (ADVIL) 600 MG tablet  Every 6 hours PRN        09/08/22 0351    tiZANidine (ZANAFLEX) 2 MG tablet  Every 6 hours PRN        09/08/22 0351             Truddie Hidden, MD 09/08/22 [phone]

## 2022-09-08 NOTE — ED Triage Notes (Signed)
Pt with c/o back pain x a couple of days. States pain "moves around". States he feels like if he "could just pas gas or belch", he would feel better. Pt with hx of kidney stones. Pt states "it doesn't feel like that". Denies any urinary symptoms.

## 2022-09-08 NOTE — ED Notes (Signed)
Patient transported to CT 

## 2022-09-09 DIAGNOSIS — E782 Mixed hyperlipidemia: Secondary | ICD-10-CM | POA: Diagnosis not present

## 2022-09-09 DIAGNOSIS — J309 Allergic rhinitis, unspecified: Secondary | ICD-10-CM | POA: Diagnosis not present

## 2022-09-09 DIAGNOSIS — M4800 Spinal stenosis, site unspecified: Secondary | ICD-10-CM | POA: Diagnosis not present

## 2022-09-09 DIAGNOSIS — Z23 Encounter for immunization: Secondary | ICD-10-CM | POA: Diagnosis not present

## 2022-09-09 DIAGNOSIS — I7143 Infrarenal abdominal aortic aneurysm, without rupture: Secondary | ICD-10-CM | POA: Diagnosis not present

## 2022-09-09 DIAGNOSIS — I129 Hypertensive chronic kidney disease with stage 1 through stage 4 chronic kidney disease, or unspecified chronic kidney disease: Secondary | ICD-10-CM | POA: Diagnosis not present

## 2022-09-09 DIAGNOSIS — E876 Hypokalemia: Secondary | ICD-10-CM | POA: Diagnosis not present

## 2022-09-09 DIAGNOSIS — I700 Atherosclerosis of aorta: Secondary | ICD-10-CM | POA: Diagnosis not present

## 2022-09-09 DIAGNOSIS — I10 Essential (primary) hypertension: Secondary | ICD-10-CM | POA: Diagnosis not present

## 2022-09-09 DIAGNOSIS — J449 Chronic obstructive pulmonary disease, unspecified: Secondary | ICD-10-CM | POA: Diagnosis not present

## 2022-09-09 DIAGNOSIS — E539 Vitamin B deficiency, unspecified: Secondary | ICD-10-CM | POA: Diagnosis not present

## 2022-09-09 DIAGNOSIS — N1831 Chronic kidney disease, stage 3a: Secondary | ICD-10-CM | POA: Diagnosis not present

## 2022-10-12 ENCOUNTER — Encounter: Payer: Self-pay | Admitting: Cardiology

## 2022-10-12 ENCOUNTER — Ambulatory Visit: Payer: Medicare Other | Attending: Cardiology | Admitting: Cardiology

## 2022-10-12 VITALS — BP 126/78 | HR 80 | Ht 72.0 in | Wt 193.2 lb

## 2022-10-12 DIAGNOSIS — I10 Essential (primary) hypertension: Secondary | ICD-10-CM | POA: Insufficient documentation

## 2022-10-12 DIAGNOSIS — E782 Mixed hyperlipidemia: Secondary | ICD-10-CM | POA: Insufficient documentation

## 2022-10-12 DIAGNOSIS — I2510 Atherosclerotic heart disease of native coronary artery without angina pectoris: Secondary | ICD-10-CM | POA: Diagnosis not present

## 2022-10-12 MED ORDER — PANTOPRAZOLE SODIUM 40 MG PO TBEC: 40.0000 mg | DELAYED_RELEASE_TABLET | Freq: Two times a day (BID) | ORAL | 3 refills | Status: DC

## 2022-10-12 MED ORDER — NITROGLYCERIN 0.4 MG SL SUBL: 0.4000 mg | SUBLINGUAL_TABLET | SUBLINGUAL | 0 refills | Status: DC | PRN

## 2022-10-12 MED ORDER — ATORVASTATIN CALCIUM 80 MG PO TABS: 80.0000 mg | ORAL_TABLET | Freq: Every day | ORAL | 3 refills | Status: DC

## 2022-10-12 NOTE — Patient Instructions (Addendum)
Medication Instructions:  Stop Simvastatin (Zocor) Begin Atorvastatin '80mg'$  daily  Protonix & Nitroglycerin refilled today  Continue all other medications.     Labwork: none  Testing/Procedures: none  Follow-Up: 6 months   Any Other Special Instructions Will Be Listed Below (If Applicable).   If you need a refill on your cardiac medications before your next appointment, please call your pharmacy.

## 2022-10-12 NOTE — Progress Notes (Signed)
Clinical Summary Mr. Kutzer is a 87 y.o.male seen for the following medical problems.    1. CAD - history of prior stenting to LAD - nuclear stress 03/2014 with no ischemia. LVEF 47%.  - admit 04/2016 with chest pain, negative EKG and enzymes. Symptoms better with belching, thought GI etiology. He had been off his protonix at the time. Symptoms improved after restarting.         - 03/2022 echo LVEF 50-55%, grade I dd,  - ongoing SOB, no chest pains. Did have COVID with some setbacks. Reports breathing did improve off lisinopril that pulmonary had stopped and also with some inhaler adjustements.  - some wheezing at times.  - compliant with meds     2. COPD - PFTs 10/2015 with moderate COPD 11/2021 admission with COPD exacerbation      3. Hyperlipidemia - Has been on zocor for several years 03/2018 TC 129 HDL TG 76 LDL 75 - 08/2022 TC 133 TG 56 HDL 41 LDL 80        4. History of GI bleed - admit 11/2017 with rectal bleeding. EGD showed esophagitis, gastritis but no active bleeding.     5. HTN - he is compliant with meds     6. Leg pain - no pain at rest. 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 Past Medical History:  Diagnosis Date   Abnormal CXR 4/20011   Chest  CT mode/serere COPD ( no mediastinal abnormality)   CAD (coronary artery disease) 2005   anterior MI with stent to the LAD in 1991 / stent 1996 / nuclear 2005, no ischemia   Carotid bruit    Doppler, June, 2013, 0-39% bilateral   COPD (chronic obstructive pulmonary disease) ([place])    Diverticula of colon    Ejection fraction    55-60%, echo, April, 2011, could not estimate RV pressure   Esophagitis    HOH (hard of hearing)    Hyperlipidemia    Hypertension    Myocardial infarction ([place])    x2   Shortness of breath    with exertion   Sleep apnea    Stop Bang  score of 5   Tobacco abuse      No Known Allergies   Current Outpatient Medications  Medication Sig Dispense Refill   albuterol (PROVENTIL HFA;VENTOLIN HFA) 108 (90 Base) MCG/ACT inhaler Inhale 1-2 puffs into the lungs every 6 (six) hours as needed for wheezing or shortness of breath.     amLODipine (NORVASC) 10 MG tablet Take 10 mg by mouth daily.     aspirin 81 MG tablet Take 1 tablet (81 mg total) by mouth every evening. 30 tablet    aspirin-acetaminophen-caffeine (EXCEDRIN MIGRAINE) 250-250-65 MG tablet Take 1 tablet by mouth every 6 (six) hours as needed for headache.     Budeson-Glycopyrrol-Formoterol (BREZTRI AEROSPHERE) 160-9-4.8 MCG/ACT AERO Inhale 2 puffs into the lungs 2 (two) times daily. 10.7 g 6   Cholecalciferol (VITAMIN D3) 50 MCG (2000 UT) CHEW Chew 1 tablet by mouth daily.     cyanocobalamin 1000 MCG tablet Take 1,000 mcg by mouth daily.     dextromethorphan-guaiFENesin (MUCINEX DM) 30-600 MG 12hr tablet Take 2 tablets by mouth 2 (two) times daily. 30 tablet 0   diphenhydrAMINE (BENADRYL) 25 MG tablet Take 12.5 mg by  mouth at bedtime as needed for sleep.     hydrochlorothiazide (HYDRODIURIL) 12.5 MG tablet Take 12.5 mg by mouth daily.     ibuprofen (ADVIL) 600 MG tablet Take 1 tablet (600 mg total) by mouth every 6 (six) hours as needed. 30 tablet 0   ipratropium-albuterol (DUONEB) 0.5-2.5 (3) MG/3ML SOLN Take 3 mLs by nebulization every 4 (four) hours as needed (Shortness of breath).     levocetirizine (XYZAL) 5 MG tablet Take 5 mg by mouth every evening.     loratadine (CLARITIN) 10 MG tablet Take 10 mg by mouth daily as needed.     losartan (COZAAR) 100 MG tablet Take 100 mg by mouth daily.     Menthol, Topical Analgesic, (ICY HOT EX) Apply 1 application topically daily as needed (pain).     nitroGLYCERIN (NITROSTAT) 0.4 MG SL tablet PLACE 1 TABLET UNDER THE TONGUE EVERY 5 MINUTES FOR 3 DOSES AS NEEDED FOR CHEST PAIN (Patient taking differently: Place 0.4 mg under the  tongue every 5 (five) minutes as needed for chest pain.) 75 tablet 3   pantoprazole (PROTONIX) 40 MG tablet TAKE 1 TABLET(40 MG) BY MOUTH TWICE DAILY 180 tablet 1   potassium chloride (KLOR-CON) 10 MEQ tablet Take 10 mEq by mouth daily.     simvastatin (ZOCOR) 40 MG tablet TAKE 1 TABLET(40 MG) BY MOUTH EVERY EVENING 90 tablet 1   tiZANidine (ZANAFLEX) 2 MG tablet Take 1 tablet (2 mg total) by mouth every 6 (six) hours as needed for muscle spasms. 30 tablet 0   No current facility-administered medications for this visit.     Past Surgical History:  Procedure Laterality Date   APPENDECTOMY     BIOPSY  11/17/2017   Procedure: BIOPSY;  Surgeon: Rogene Houston, MD;  [facility];  Service: Endoscopy;;  gastric   CARDIAC CATHETERIZATION     CATARACT EXTRACTION Left    CATARACT EXTRACTION W/PHACO Right 01/22/2014   Procedure: CATARACT EXTRACTION PHACO AND INTRAOCULAR LENS PLACEMENT ([place]);  Surgeon: Tonny Cigi Bega, MD;  Location: [facility];  Service: Ophthalmology;  Laterality: Right;  CDE 7.46   CHOLECYSTECTOMY     8 years ago [facility]   COLONOSCOPY  10/05/2012   Dr. Laural Golden: multiple diverticula at sigmoid colon, multiple polyps (tubular adenomas), small external hemorrhoids   COLONOSCOPY N/A 11/18/2017   Procedure: COLONOSCOPY;  Surgeon: Rogene Houston, MD;  [facility];  Service: Endoscopy;  Laterality: N/A;   COLONOSCOPY WITH PROPOFOL N/A 05/07/2021   Procedure: COLONOSCOPY WITH PROPOFOL;  Surgeon: Rogene Houston, MD;  [facility];  Service: Endoscopy;  Laterality: N/A;   CYST REMOVAL HAND     right wrist   ESOPHAGEAL DILATION N/A 05/23/2015   Procedure: ESOPHAGEAL DILATION;  Surgeon: Daneil Dolin, MD;  [facility];  Service: Endoscopy;  Laterality: N/A;   ESOPHAGEAL DILATION N/A 11/17/2017   Procedure: ESOPHAGEAL DILATION;  Surgeon: Rogene Houston, MD;  [facility];  Service: Endoscopy;  Laterality: N/A;   ESOPHAGEAL DILATION N/A 05/07/2021    Procedure: ESOPHAGEAL DILATION;  Surgeon: Rogene Houston, MD;  [facility];  Service: Endoscopy;  Laterality: N/A;   ESOPHAGOGASTRODUODENOSCOPY N/A 05/23/2015   Dr. Gala Romney: Ulcerative/ erosive reflux esophagitis with peptic stricture formation. Status post dilation as described. Hiatal hernia. Abnormal gastric mucosa of uncertain significance status post gastric biospy. +H.pylori   ESOPHAGOGASTRODUODENOSCOPY N/A 11/17/2017   Procedure: ESOPHAGOGASTRODUODENOSCOPY (EGD);  Surgeon: Rogene Houston, MD;  [facility];  Service: Endoscopy;  Laterality: N/A;   ESOPHAGOGASTRODUODENOSCOPY (EGD) WITH PROPOFOL N/A 05/07/2021   Procedure: ESOPHAGOGASTRODUODENOSCOPY (EGD) WITH PROPOFOL;  Surgeon: Rogene Houston, MD;  [facility];  Service: Endoscopy;  Laterality: N/A;   PERCUTANEOUS STENT INTERVENTION     SKIN CANCER EXCISION  2015   ear & neck   TONSILLECTOMY       No Known Allergies    Family History  Problem Relation Age of Onset   Heart disease Mother    Heart disease Father    Heart disease Brother    Stomach cancer Brother    Colon cancer Neg Hx      Social History Mr. Ramcharan reports that he quit smoking about 34 years ago. His smoking use included cigarettes. He started smoking about 39 years ago. He has a 36.00 pack-year smoking history. His smokeless tobacco use includes chew. Mr. Yono reports no history of alcohol use.   Review of Systems CONSTITUTIONAL: No weight loss, fever, chills, weakness or fatigue.  HEENT: Eyes: No visual loss, blurred vision, double vision or yellow sclerae.No hearing loss, sneezing, congestion, runny nose or sore throat.  SKIN: No rash or itching.  CARDIOVASCULAR: per hpi RESPIRATORY: No shortness of breath, cough or sputum.  GASTROINTESTINAL: No anorexia, nausea, vomiting or diarrhea. No abdominal pain or blood.  GENITOURINARY: No burning on urination, no polyuria NEUROLOGICAL: No headache, dizziness, syncope,  paralysis, ataxia, numbness or tingling in the extremities. No change in bowel or bladder control.  MUSCULOSKELETAL: No muscle, back pain, joint pain or stiffness.  LYMPHATICS: No enlarged nodes. No history of splenectomy.  PSYCHIATRIC: No history of depression or anxiety.  ENDOCRINOLOGIC: No reports of sweating, cold or heat intolerance. No polyuria or polydipsia.  Marland Kitchen   Physical Examination Today's Vitals   10/12/22 0946  BP: 126/78  Pulse: 80  SpO2: 96%  Weight: 193 lb 3.2 oz (87.6 kg)  Height: 6' (1.829 m)   Body mass index is 26.2 kg/m.  Gen: resting comfortably, no acute distress HEENT: no scleral icterus, pupils equal round and reactive, no palptable cervical adenopathy,  CV: RRR, no m/r/g no jvd Resp: Clear to auscultation bilaterally GI: abdomen is soft, non-tender, non-distended, normal bowel sounds, no hepatosplenomegaly MSK: extremities are warm, no edema.  Skin: warm, no rash Neuro:  no focal deficits Psych: appropriate affect   Diagnostic Studies 03/2022 echo 1. Left ventricular ejection fraction, by estimation, is approximately  55%. The left ventricle has normal function. The left ventricle has no  regional wall motion abnormalities. Left ventricular diastolic parameters  are consistent with Grade I diastolic   dysfunction (impaired relaxation). The average left ventricular global  longitudinal strain is -18.0 %. The global longitudinal strain is normal.   2. Right ventricular systolic function is normal. The right ventricular  size is normal. There is normal pulmonary artery systolic pressure. The  estimated right ventricular systolic pressure is 44.3 mmHg.   3. The mitral valve is grossly normal. Trivial mitral valve  regurgitation.   4. The aortic valve is tricuspid. Aortic valve regurgitation is not  visualized. Aortic valve sclerosis is present, with no evidence of aortic  valve stenosis.   5. The inferior vena cava is normal in size with greater than  50%  respiratory variability, suggesting right atrial pressure of 3 mmHg.        Assessment and Plan   1. CAD - no chest pains - chronic dyspnea that is improving with recent adjustments made by  pulmonary. Montior at this time. Recent benign echo, if progrssion of DOE without clear pulmonary cause would consider stress testing.    2. Hyperlipidemia -above goal, stop simvastatin and start atorvastatin '80mg'$  daily.      3. HTN -at goal, continue current meds  F/u 6 months   Arnoldo Lenis, M.D.

## 2022-11-03 ENCOUNTER — Ambulatory Visit (INDEPENDENT_AMBULATORY_CARE_PROVIDER_SITE_OTHER): Payer: Medicare Other | Admitting: Internal Medicine

## 2022-11-03 ENCOUNTER — Ambulatory Visit (INDEPENDENT_AMBULATORY_CARE_PROVIDER_SITE_OTHER): Payer: Medicare Other

## 2022-11-03 ENCOUNTER — Encounter: Payer: Self-pay | Admitting: Internal Medicine

## 2022-11-03 VITALS — BP 114/64 | HR 88 | Temp 98.2°F | Ht 72.0 in | Wt 190.8 lb

## 2022-11-03 DIAGNOSIS — J449 Chronic obstructive pulmonary disease, unspecified: Secondary | ICD-10-CM

## 2022-11-03 DIAGNOSIS — J439 Emphysema, unspecified: Secondary | ICD-10-CM | POA: Diagnosis not present

## 2022-11-03 DIAGNOSIS — I2510 Atherosclerotic heart disease of native coronary artery without angina pectoris: Secondary | ICD-10-CM | POA: Diagnosis not present

## 2022-11-03 MED ORDER — BREZTRI AEROSPHERE 160-9-4.8 MCG/ACT IN AERO: 2.0000 | INHALATION_SPRAY | Freq: Two times a day (BID) | RESPIRATORY_TRACT | 6 refills | Status: DC

## 2022-11-03 MED ORDER — BREZTRI AEROSPHERE 160-9-4.8 MCG/ACT IN AERO: 2.0000 | INHALATION_SPRAY | Freq: Two times a day (BID) | RESPIRATORY_TRACT | 0 refills | Status: DC

## 2022-11-03 MED ORDER — PREDNISONE 10 MG PO TABS: ORAL_TABLET | ORAL | 0 refills | Status: DC

## 2022-11-03 NOTE — Assessment & Plan Note (Signed)
Quit smoking 1990  - Spirometry 11/08/15   FEV1 1.89 (58%)  Ratio 0.49 p 10% improvement from saba  - 10/13/2021  After extensive coaching inhaler device,  effectiveness =    75% (short Ti)  - d/c acei 11/22/21 > all wheeze resolve by 12/05/2021 ov - PFT's  12/03/21  FEV1 1.76 (58 % ) ratio 0.42  p 10 % improvement from saba p 0 prior to study with DLCO  14.04 (55%) corrects to 2.66  (69%)  for alv volume and FV curve classically concave    - 11/03/2022  After extensive coaching inhaler device,  effectiveness =    80% (slt delayed trigger)   Group D (now reclassified as E) in terms of symptom/risk and laba/lama/ICS  therefore appropriate rx at this point >>>  breztri and more approp saba as per Action plan  For present aecopd rx pred x 6 days and do not taper the breztri in future.  Check cxr to be complete (? Component cardiac asthma since flare occurred so abruptly and continue gerd rx/ diet/ bed elevation as per prior instructions >>> cxr copd only, no chf/ ild or air space dz      Each maintenance medication was reviewed in detail including emphasizing most importantly the difference between maintenance and prns and under what circumstances the prns are to be triggered using an action plan format where appropriate.  Total time for H and P, chart review, counseling, reviewing hfa / neb  device(s) and generating customized AVS unique to this office visit / same day charting = 30 min Acute eval

## 2022-11-03 NOTE — Progress Notes (Unsigned)
Jeremy [name], male    DOB: 1936-07-22,    MRN: [id number]   Brief patient profile:  89  yowm quit smoking 1990 p MI  referred to pulmonary clinic in [place]  10/13/2021 by Dr Nevada Crane for copd eval with Nov 2022 > ER with dx aecopd with GOLD 2 criteria in 2017 but not requiring any maint rx up until Nov 2022     History of Present Illness  10/13/2021  Pulmonary/ 1st [name] eval/ Jeremy [name] / Jeremy [name] [name] / on ACEi/breztri  Chief Complaint  Patient presents with   Consult    Patient had covid in 2019. Patient states that he does feel better now. States that he really needed to be seen a month ago but is better now. States that he is able to walk longer distance now than before.   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 Much worse in am's historically but better now  Cough: none but still urge to clear his throat Sleep: bed blocks/ one pillow SABA use: much less now breztri one bid / maybe one albuterol hfa/ neb one each am  Rec Ok to continue lisinopril but it may be part of the problem with your throat (clearing, choking, gagging, something  stuck, problem swallowing)   Plan A = Automatic = Always=    Breztri Take 1-2 puffs first thing in am and then another 2 puffs about 12 hours later.   Work on inhaler technique:   Plan B = Backup (to supplement plan A, not to replace it)Only use your albuterol inhaler as a rescue medication to be used if you can't catch your breath by resting or doing a relaxed purse lip pattern.  Plan C = Crisis (instead of Plan B but only if Plan B stops working) - only use your albuterol nebulizer if you first try Plan B   GERD diet reviewed, bed blocks rec    Admit date: 11/22/2021 Discharge date: 11/27/2021 Discharge Condition:stable Discharge Diagnoses:  Principal Problem:   COPD exacerbation ([place])   Essential hypertension   Hyperlipidemia   CAD (coronary artery disease)   COPD GOLD 2    Reflux esophagitis    Hypokalemia   COPD exacerbation ([facility]) vs ACEi case  Continue breztri on discharge Continue DuoNebs Treated with solumedrol, transition to prednisone taper on discharge Continue Mucinex Completed doxycycline as inpatient Pulmonology following, appreciate assistance Lisinopril discontinued in favor of losartan   02/11/2022  f/u ov/[name]/Jeremy [name] re: copd gold 2   maint on breztri 2 bid  and off acei  Chief Complaint  Patient presents with   Follow-up    Breathing about the same since last ov.    Dyspnea:  out doing some farm work  Cough: none  Sleeping: risers/ sleeping ok  SABA use: none now  02: none  Covid status: vax x one / infected x one Rec Plan A = Automatic = Always=  Breztri  2 in am and the pm doses are optional  Plan B = Backup (to supplement plan A, not to replace it) Only use your albuterol inhaler as a rescue medication Plan C = Crisis (instead of Plan B but only if Plan B stops working) - only use your albuterol nebulizer if you first try Plan B     08/05/2022  f/u ov/Jeremy [name]/Jeremy [name] re: [name] maint on Breztri  Chief Complaint  Patient presents with   Follow-up    Breathing  about the same since last ov   Dyspnea:  no change doe but legs swelling more  Cough: none  Sleeping: sleeping on bed with risers ok  SABA use: not needing  02: none Rec No change in medications - your insurance company may provide you with a list of alternatives if Judithann Sauger becomes too pricey - please bring it in to next visit to review.       11/03/2022 Acute ov/Jeremy [name] re: GOLD 2    maint on Breztri 2 am  until Sat 2/17 then 2/18 early am much worse and increased to 2 bid though did not push saba up as much as action plan and never used the neb as per action plan  Chief Complaint  Patient presents with   Acute Visit    Wheeze, chest tightness and slight mucus production.  Clear mucus.  Sx 10/30/22.  Dyspnea:  much better since increased breztri as above Cough: not  much clear mucus p stirs/ not wakinghim up  Sleeping: bed blocks / wheezes loudy per wheeze  SABA use: just hfa x 3 a day  02: none  Covid status:   vax x one/ infected x one     No obvious day to day or daytime variability or assoc excess/ purulent sputum or mucus plugs or hemoptysis or cp or chest tightness, subjective wheeze or overt sinus or hb symptoms.   Sleeping  without nocturnal  or early am exacerbation  of respiratory  c/o's or need for noct saba. Also denies any obvious fluctuation of symptoms with weather or environmental changes or other aggravating or alleviating factors except as outlined above   No unusual exposure hx or h/o childhood pna/ asthma or knowledge of premature birth.  Current Allergies, Complete Past Medical History, Past Surgical History, Family History, and Social History were reviewed in [employer] record.  ROS  The following are not active complaints unless bolded Hoarseness, sore throat, dysphagia, dental problems, itching, sneezing,  nasal congestion or discharge of excess mucus or purulent secretions, ear ache,   fever, chills, sweats, unintended wt loss or wt gain, classically pleuritic or exertional cp,  orthopnea pnd or arm/hand swelling  or leg swelling, presyncope, palpitations, abdominal pain, anorexia, nausea, vomiting, diarrhea  or change in bowel habits or change in bladder habits, change in stools or change in urine, dysuria, hematuria,  rash, arthralgias, visual complaints, headache, numbness, weakness or ataxia or problems with walking or coordination,  change in mood or  memory.        Current Meds  Medication Sig   albuterol (PROVENTIL HFA;VENTOLIN HFA) 108 (90 Base) MCG/ACT inhaler Inhale 1-2 puffs into the lungs every 6 (six) hours as needed for wheezing or shortness of breath.   amLODipine (NORVASC) 10 MG tablet Take 5 mg by mouth daily.   aspirin 81 MG tablet Take 1 tablet (81 mg total) by mouth every evening.    atorvastatin (LIPITOR) 80 MG tablet Take 1 tablet (80 mg total) by mouth daily.   Budeson-Glycopyrrol-Formoterol (BREZTRI AEROSPHERE) 160-9-4.8 MCG/ACT AERO Inhale 2 puffs into the lungs 2 (two) times daily.   Cholecalciferol (VITAMIN D3) 50 MCG (2000 UT) CHEW Chew 1 tablet by mouth daily.   cyanocobalamin 1000 MCG tablet Take 1,000 mcg by mouth daily.   diphenhydrAMINE (BENADRYL) 25 MG tablet Take 12.5 mg by mouth at bedtime as needed for sleep.   ipratropium-albuterol (DUONEB) 0.5-2.5 (3) MG/3ML SOLN Take 3 mLs by nebulization every 4 (four) hours as needed (Shortness  of breath).   levocetirizine (XYZAL) 5 MG tablet Take 5 mg by mouth every evening.   losartan (COZAAR) 100 MG tablet Take 100 mg by mouth daily.   Menthol, Topical Analgesic, (ICY HOT EX) Apply 1 application topically daily as needed (pain).   nitroGLYCERIN (NITROSTAT) 0.4 MG SL tablet Place 1 tablet (0.4 mg total) under the tongue every 5 (five) minutes as needed for chest pain.   pantoprazole (PROTONIX) 40 MG tablet Take 1 tablet (40 mg total) by mouth 2 (two) times daily.   tamsulosin (FLOMAX) 0.4 MG CAPS capsule Take 0.4 mg by mouth at bedtime.   tiZANidine (ZANAFLEX) 2 MG tablet Take 1 tablet (2 mg total) by mouth every 6 (six) hours as needed for muscle spasms.          Past Medical History:  Diagnosis Date   Abnormal CXR 4/20011   Chest  CT mode/serere COPD ( no mediastinal abnormality)   CAD (coronary artery disease) 2005   anterior MI with stent to the LAD in 1991 / stent 1996 / nuclear 2005, no ischemia   Carotid bruit    Doppler, June, 2013, 0-39% bilateral   COPD (chronic obstructive pulmonary disease) ([place])    Diverticula of colon    Ejection fraction    55-60%, echo, April, 2011, could not estimate RV pressure   Esophagitis    HOH (hard of hearing)    Hyperlipidemia    Hypertension    Myocardial infarction ([place])    x2   Shortness of breath    with exertion   Sleep apnea    Stop Bang score of 5    Tobacco abuse         Objective:    Wts  11/03/2022       190   08/05/2022     193 02/11/2022       187   12/05/21 185 lb 9.6 oz (84.2 kg)  11/22/21 187 lb (84.8 kg)  10/13/21 187 lb (84.8 kg)      Vital signs reviewed  11/03/2022  - Note at rest 02 sats  95% on RA   General appearance:    amb wm very hoh  HEENT : masked    NECK :  without  apparent JVD/ palpable Nodes/TM    LUNGS: no acc muscle use,  Mild barrel  contour chest wall with bilateral  Distant bs s audible wheeze and  without cough on insp or exp maneuvers  and mild  Hyperresonant  to  percussion bilaterally     CV:  RRR  no s3 or murmur or increase in P2, and no edema   ABD:  soft and nontender   MS:  Nl gait/ ext warm without deformities Or obvious joint restrictions  calf tenderness, cyanosis or clubbing     SKIN: warm and dry without lesions    NEURO:  alert, approp, nl sensorium with  no motor or cerebellar deficits apparent.         CXR PA and Lateral:   11/03/2022 :    I personally reviewed images and impression is as follows:    Copd only, no acute findngs   Assessment

## 2022-11-03 NOTE — Patient Instructions (Signed)
Plan A = Automatic = Always=    Breztri  Take 2 puffs first thing in am and then another 2 puffs about 12 hours later.   Work on Product manager  inhaler technique:  relax and gently blow all the way out then take a nice smooth full deep breath back in, triggering the inhaler at same time you start breathing in.  Hold breath in for at least  5 seconds if you can. Blow out breztri thru nose. Rinse and gargle with water when done.  If mouth or throat bother you at all,  try brushing teeth/gums/tongue with arm and hammer toothpaste/ make a slurry and gargle and spit out.     Plan B = Backup (to supplement plan A, not to replace it) Only use your albuterol inhaler as a rescue medication to be used if you can't catch your breath by resting or doing a relaxed purse lip breathing pattern.  - The less you use it, the better it will work when you need it. - Ok to use the inhaler up to 2 puffs  every 4 hours if you must but call for appointment if use goes up over your usual need - Don't leave home without it !!  (think of it like the spare tire for your car)   Plan C = Crisis (instead of Plan B but only if Plan B stops working) - only use your albuterol nebulizer if you first try Plan B and it fails to help > ok to use the nebulizer up to every 4 hours but if start needing it regularly call for immediate appointment   Prednisone 10 mg take  4 each am x 2 days,   2 each am x 2 days,  1 each am x 2 days and stop   Please remember to go to the lab and x-ray department  for your tests - we will call you with the results when they are available.       Keep previous appt - call sooner if needed

## 2022-12-16 DIAGNOSIS — E539 Vitamin B deficiency, unspecified: Secondary | ICD-10-CM | POA: Diagnosis not present

## 2022-12-16 DIAGNOSIS — I10 Essential (primary) hypertension: Secondary | ICD-10-CM | POA: Diagnosis not present

## 2022-12-16 DIAGNOSIS — N1831 Chronic kidney disease, stage 3a: Secondary | ICD-10-CM | POA: Diagnosis not present

## 2022-12-22 DIAGNOSIS — I7143 Infrarenal abdominal aortic aneurysm, without rupture: Secondary | ICD-10-CM | POA: Diagnosis not present

## 2022-12-22 DIAGNOSIS — E663 Overweight: Secondary | ICD-10-CM | POA: Diagnosis not present

## 2022-12-22 DIAGNOSIS — E782 Mixed hyperlipidemia: Secondary | ICD-10-CM | POA: Diagnosis not present

## 2022-12-22 DIAGNOSIS — E876 Hypokalemia: Secondary | ICD-10-CM | POA: Diagnosis not present

## 2022-12-22 DIAGNOSIS — J309 Allergic rhinitis, unspecified: Secondary | ICD-10-CM | POA: Diagnosis not present

## 2022-12-22 DIAGNOSIS — R600 Localized edema: Secondary | ICD-10-CM | POA: Diagnosis not present

## 2022-12-22 DIAGNOSIS — J449 Chronic obstructive pulmonary disease, unspecified: Secondary | ICD-10-CM | POA: Diagnosis not present

## 2022-12-22 DIAGNOSIS — I129 Hypertensive chronic kidney disease with stage 1 through stage 4 chronic kidney disease, or unspecified chronic kidney disease: Secondary | ICD-10-CM | POA: Diagnosis not present

## 2022-12-22 DIAGNOSIS — M4800 Spinal stenosis, site unspecified: Secondary | ICD-10-CM | POA: Diagnosis not present

## 2022-12-22 DIAGNOSIS — E539 Vitamin B deficiency, unspecified: Secondary | ICD-10-CM | POA: Diagnosis not present

## 2022-12-22 DIAGNOSIS — N1831 Chronic kidney disease, stage 3a: Secondary | ICD-10-CM | POA: Diagnosis not present

## 2022-12-22 DIAGNOSIS — I700 Atherosclerosis of aorta: Secondary | ICD-10-CM | POA: Diagnosis not present

## 2022-12-24 ENCOUNTER — Other Ambulatory Visit: Payer: Self-pay | Admitting: Cardiology

## 2023-01-18 ENCOUNTER — Encounter: Payer: Self-pay | Admitting: Internal Medicine

## 2023-01-18 ENCOUNTER — Ambulatory Visit (INDEPENDENT_AMBULATORY_CARE_PROVIDER_SITE_OTHER): Payer: Medicare Other | Admitting: Internal Medicine

## 2023-01-18 VITALS — BP 111/72 | HR 93 | Ht 72.0 in | Wt 190.6 lb

## 2023-01-18 DIAGNOSIS — J449 Chronic obstructive pulmonary disease, unspecified: Secondary | ICD-10-CM | POA: Diagnosis not present

## 2023-01-18 MED ORDER — BUDESONIDE 0.25 MG/2ML IN SUSP: RESPIRATORY_TRACT | 12 refills | Status: DC

## 2023-01-18 MED ORDER — FORMOTEROL FUMARATE 20 MCG/2ML IN NEBU: INHALATION_SOLUTION | RESPIRATORY_TRACT | 11 refills | Status: DC

## 2023-01-18 MED ORDER — PREDNISONE 10 MG PO TABS: ORAL_TABLET | ORAL | 0 refills | Status: DC

## 2023-01-18 NOTE — Progress Notes (Signed)
Jeremy [name], male    DOB: 1936-07-22,    MRN: [id number]   Brief patient profile:  89  yowm quit smoking 1990 p MI  referred to pulmonary clinic in [place]  10/13/2021 by Dr Nevada Crane for copd eval with Nov 2022 > ER with dx aecopd with GOLD 2 criteria in 2017 but not requiring any maint rx up until Nov 2022     History of Present Illness  10/13/2021  Pulmonary/ 1st office eval/ Jewelene Mairena / [name] / on ACEi/breztri  Chief Complaint  Patient presents with   Consult    Patient had covid in 2019. Patient states that he does feel better now. States that he really needed to be seen a month ago but is better now. States that he is able to walk longer distance now than before.   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 Much worse in am's historically but better now  Cough: none but still urge to clear his throat Sleep: bed blocks/ one pillow SABA use: much less now breztri one bid / maybe one albuterol hfa/ neb one each am  Rec Ok to continue lisinopril but it may be part of the problem with your throat (clearing, choking, gagging, something  stuck, problem swallowing)   Plan A = Automatic = Always=    Breztri Take 1-2 puffs first thing in am and then another 2 puffs about 12 hours later.   Work on inhaler technique:   Plan B = Backup (to supplement plan A, not to replace it)Only use your albuterol inhaler as a rescue medication to be used if you can't catch your breath by resting or doing a relaxed purse lip pattern.  Plan C = Crisis (instead of Plan B but only if Plan B stops working) - only use your albuterol nebulizer if you first try Plan B   GERD diet reviewed, bed blocks rec    Admit date: 11/22/2021 Discharge date: 11/27/2021 Discharge Condition:stable Discharge Diagnoses:  Principal Problem:   COPD exacerbation ([place])   Essential hypertension   Hyperlipidemia   CAD (coronary artery disease)   COPD GOLD 2    Reflux esophagitis    Hypokalemia   COPD exacerbation (HCC) vs ACEi case  Continue breztri on discharge Continue DuoNebs Treated with solumedrol, transition to prednisone taper on discharge Continue Mucinex Completed doxycycline as inpatient Pulmonology following, appreciate assistance Lisinopril discontinued in favor of losartan   02/11/2022  f/u ov/[facility]/Tynetta Bachmann re: copd gold 2   maint on breztri 2 bid  and off acei  Chief Complaint  Patient presents with   Follow-up    Breathing about the same since last ov.    Dyspnea:  out doing some farm work  Cough: none  Sleeping: risers/ sleeping ok  SABA use: none now  02: none  Covid status: vax x one / infected x one Rec Plan A = Automatic = Always=  Breztri  2 in am and the pm doses are optional  Plan B = Backup (to supplement plan A, not to replace it) Only use your albuterol inhaler as a rescue medication Plan C = Crisis (instead of Plan B but only if Plan B stops working) - only use your albuterol nebulizer if you first try Plan B     08/05/2022  f/u ov/[name]/Kaleea Penner re: [name] maint on Breztri  Chief Complaint  Patient presents with   Follow-up    Breathing  about the same since last ov   Dyspnea:  no change doe but legs swelling more  Cough: none  Sleeping: sleeping on bed with risers ok  SABA use: not needing  02: none Rec No change in medications - your insurance company may provide you with a list of alternatives if Markus Daft becomes too pricey - please bring it in to next visit to review.       11/03/2022 Acute ov/Alan Drummer re: GOLD 2    maint on Breztri 2 am  until Sat 2/17 then 2/18 early am much worse and increased to 2 bid though did not push saba up as much as action plan and never used the neb as per action plan  Chief Complaint  Patient presents with   Acute Visit    Wheeze, chest tightness and slight mucus production.  Clear mucus.  Sx 10/30/22.  Dyspnea:  much better since increased breztri as above Cough: not  much clear mucus p stirs/ not wakinghim up  Sleeping: bed blocks / wheezes loudy per wife  SABA use: just hfa x 3 a day  02: none  Rec Plan A = Automatic = Always=    Breztri  Take 2 puffs first thing in am and then another 2 puffs about 12 hours later.  Work on Engineer, civil (consulting) B = Backup (to supplement plan A, not to replace it) Only use your albuterol inhaler as a rescue medication  Plan C = Crisis (instead of Plan B but only if Plan B stops working) - only use your albuterol nebulizer if you first try Plan B  Prednisone 10 mg take  4 each am x 2 days,   2 each am x 2 days,  1 each am x 2 days and stop      01/18/2023  f/u ov/[facility]/Daytona Retana re: GOLD 2 copdmaint on breztri  Chief Complaint  Patient presents with   Acute Visit    Pt f/u states that he is having some increased SOB, which has lead to him doing neb treatments and the albuterol more frequently. He does feel like the   Dyspnea:  worse x 2 weeks  Cough: more cough esp in am slt discolored, then better by afternoon Sleeping: still wheezing some at hs per wife  SABA use: using neb sev times a week  02: none   No obvious day to day or daytime variability or assoc    mucus plugs or hemoptysis or cp or chest tightness, subjective wheeze or overt sinus or hb symptoms.    Also denies any obvious fluctuation of symptoms with weather or environmental changes or other aggravating or alleviating factors except as outlined above   No unusual exposure hx or h/o childhood pna/ asthma or knowledge of premature birth.  Current Allergies, Complete Past Medical History, Past Surgical History, Family History, and Social History were reviewed in [employer] record.  ROS  The following are not active complaints unless bolded Hoarseness, sore throat, dysphagia, dental problems, itching, sneezing,  nasal congestion or discharge of excess mucus or purulent secretions, ear ache,   fever, chills, sweats,  unintended wt loss or wt gain, classically pleuritic or exertional cp,  orthopnea pnd or arm/hand swelling  or leg swelling occ , presyncope, palpitations, abdominal pain, anorexia, nausea, vomiting, diarrhea  or change in bowel habits or change in bladder habits, change in stools or change in urine, dysuria, hematuria,  rash, arthralgias, visual complaints, headache, numbness, weakness or  ataxia or problems with walking or coordination,  change in mood or  memory.  Poor hearing        Current Meds  Medication Sig   albuterol (PROVENTIL HFA;VENTOLIN HFA) 108 (90 Base) MCG/ACT inhaler Inhale 1-2 puffs into the lungs every 6 (six) hours as needed for wheezing or shortness of breath.   amLODipine (NORVASC) 10 MG tablet Take 5 mg by mouth daily.   aspirin 81 MG tablet Take 1 tablet (81 mg total) by mouth every evening.   atorvastatin (LIPITOR) 80 MG tablet Take 1 tablet (80 mg total) by mouth daily.   Budeson-Glycopyrrol-Formoterol (BREZTRI AEROSPHERE) 160-9-4.8 MCG/ACT AERO Inhale 1 puff into the lungs in the morning and at bedtime.   Cholecalciferol (VITAMIN D3) 50 MCG (2000 UT) CHEW Chew 1 tablet by mouth daily.   cyanocobalamin 1000 MCG tablet Take 1,000 mcg by mouth daily.   diphenhydrAMINE (BENADRYL) 25 MG tablet Take 12.5 mg by mouth at bedtime as needed for sleep.   ipratropium-albuterol (DUONEB) 0.5-2.5 (3) MG/3ML SOLN Take 3 mLs by nebulization every 4 (four) hours as needed (Shortness of breath).   levocetirizine (XYZAL) 5 MG tablet Take 5 mg by mouth every evening.   losartan (COZAAR) 100 MG tablet Take 100 mg by mouth daily.   Menthol, Topical Analgesic, (ICY HOT EX) Apply 1 application topically daily as needed (pain).   nitroGLYCERIN (NITROSTAT) 0.4 MG SL tablet PLACE 1 TABLET UNDER THE TONGUE EVERY 5 MINUTES AS NEEDED FOR CHEST PAIN. MAX 3 TABLETS PER EVENT. CALL 911   pantoprazole (PROTONIX) 40 MG tablet Take 1 tablet (40 mg total) by mouth 2 (two) times daily.   predniSONE  (DELTASONE) 10 MG tablet Take  4 each am x 2 days,   2 each am x 2 days,  1 each am x 2 days and stop   tamsulosin (FLOMAX) 0.4 MG CAPS capsule Take 0.4 mg by mouth at bedtime.   tiZANidine (ZANAFLEX) 2 MG tablet Take 1 tablet (2 mg total) by mouth every 6 (six) hours as needed for muscle spasms.   [DISCONTINUED] tiotropium (SPIRIVA HANDIHALER) 18 MCG inhalation capsule Place 18 mcg into inhaler and inhale daily.               Past Medical History:  Diagnosis Date   Abnormal CXR 4/20011   Chest  CT mode/serere COPD ( no mediastinal abnormality)   CAD (coronary artery disease) 2005   anterior MI with stent to the LAD in 1991 / stent 1996 / nuclear 2005, no ischemia   Carotid bruit    Doppler, June, 2013, 0-39% bilateral   COPD (chronic obstructive pulmonary disease) (HCC)    Diverticula of colon    Ejection fraction    55-60%, echo, April, 2011, could not estimate RV pressure   Esophagitis    HOH (hard of hearing)    Hyperlipidemia    Hypertension    Myocardial infarction (HCC)    x2   Shortness of breath    with exertion   Sleep apnea    Stop Bang score of 5   Tobacco abuse         Objective:    Wts  01/18/2023         190  11/03/2022       190   08/05/2022     193 02/11/2022       187   12/05/21 185 lb 9.6 oz (84.2 kg)  11/22/21 187 lb (84.8 kg)  10/13/21 187 lb (84.8  kg)    Vital signs reviewed  01/18/2023  - Note at rest 02 sats  92% on RA   General appearance:    amb wm freq throat clearing / very poor hearing  HEENT : Oropharynx  clear  Nasal turbinates mild non-specific edema    NECK :  without  apparent JVD/ palpable Nodes/TM    LUNGS: no acc muscle use,  Mild barrel  contour chest wall with bilateral  Distant bs s audible wheeze and  without cough on insp or exp maneuvers  and mild  Hyperresonant  to  percussion bilaterally     CV:  RRR  no s3 or murmur or increase in P2, and no edema   ABD:  soft and nontender with pos end  insp Hoover's  in the  supine position.  No bruits or organomegaly appreciated   MS:  Nl gait/ ext warm without deformities Or obvious joint restrictions  calf tenderness, cyanosis or clubbing     SKIN: warm and dry without lesions    NEURO:  alert, approp, nl sensorium with  no motor or cerebellar deficits apparent.      Assessment

## 2023-01-18 NOTE — Patient Instructions (Addendum)
Prednisone 10 mg take  4 each am x 2 days,   2 each am x 2 days,  1 each am x 2 days and stop   Plan A = Automatic = Always=   stop breztri when your shipment arrives arrives:    Formoterol/budesonide first thing in am and repeat in am   Plan B = Backup (to supplement plan A, not to replace it) Only use your albuterol inhaler as a rescue medication to be used if you can't catch your breath by resting or doing a relaxed purse lip breathing pattern.  - The less you use it, the better it will work when you need it. - Ok to use the inhaler up to 2 puffs  every 4 hours if you must but call for appointment if use goes up over your usual need - Don't leave home without it !!  (think of it like the spare tire for your car)   Plan C = Crisis (instead of Plan B but only if Plan B stops working) - only use your albuterol-ipatropium  nebulizer if you first try Plan B and it fails to help > ok to use the nebulizer up to every 4 hours but if start needing it regularly call for immediate appointment  Keep prior appt   Late add:  note his rescue neb is duoneb so will need change to yupelri and d/c ipatropium next ov

## 2023-01-18 NOTE — Assessment & Plan Note (Signed)
Quit smoking 1990  - Spirometry 11/08/15   FEV1 1.89 (58%)  Ratio 0.49 p 10% improvement from saba  - 10/13/2021  After extensive coaching inhaler device,  effectiveness =    75% (short Ti)  - d/c acei 11/22/21 > all wheeze resolve by 12/05/2021 ov - PFT's  12/03/21  FEV1 1.76 (58 % ) ratio 0.42  p 10 % improvement from saba p 0 prior to study with DLCO  14.04 (55%) corrects to 2.66  (69%)  for alv volume and FV curve classically concave    - 11/03/2022  After extensive coaching inhaler device,  effectiveness =    80% (slt delayed trigger)  >>>  01/18/2023 flared on breztri/  "neb is the only thing that helps"  with assoc throat clearing ? Adverse effects of hfa so rec change to neb formoterol/bud and ? add yupelri next ov (but change duoneb to saba only at that point)    Group D (now reclassified as E) in terms of symptom/risk and laba/lama/ICS  therefore appropriate rx at this point >>>  breztri not effective ? Why > try nebs x one month if can get them thru insurance then maybe add the yupelri if possbile  Reviewed saba: Re SABA :  I spent extra time with pt today reviewing appropriate use of albuterol for prn use on exertion with the following points: 1) saba is for relief of sob that does not improve by walking a slower pace or resting but rather if the pt does not improve after trying this first. 2) If the pt is convinced, as many are, that saba helps recover from activity faster then it's easy to tell if this is the case by re-challenging : ie stop, take the inhaler, then p 5 minutes try the exact same activity (intensity of workload) that just caused the symptoms and see if they are substantially diminished or not after saba 3) if there is an activity that reproducibly causes the symptoms, try the saba 15 min before the activity on alternate days   If in fact the saba really does help, then fine to continue to use it prn but advised may need to look closer at the maintenance regimen being used to  achieve better control of airways disease with exertion.          Each maintenance medication was reviewed in detail including emphasizing most importantly the difference between maintenance and prns and under what circumstances the prns are to be triggered using an action plan format where appropriate.  Total time for H and P, chart review, counseling, reviewing hfa/ neb  device(s) and generating customized AVS unique to this office visit / same day charting = 33 min acute office evaluation

## 2023-01-19 ENCOUNTER — Telehealth: Payer: Self-pay | Admitting: Internal Medicine

## 2023-01-19 NOTE — Telephone Encounter (Signed)
Gertie Gowda needs demographics and office notes. For Nebulizer solutions. [facility] phone number is [phone]. Fax number is [phone].

## 2023-01-21 NOTE — Telephone Encounter (Signed)
LVM w/ Gertie Gowda letting her know I have faxed OV notes/demographics to the fax # provided. NFN att

## 2023-02-02 ENCOUNTER — Telehealth: Payer: Self-pay | Admitting: Internal Medicine

## 2023-02-02 NOTE — Telephone Encounter (Signed)
Gertie Gowda would like to discuss patient's Nebulizer solutions. [facility] phone number is [phone].

## 2023-02-02 NOTE — Telephone Encounter (Signed)
Spoke with Jeremy [name] with [facility]  She called to let us know that pt's nebs are covered completely and have been mailed to him  Nothing further needed

## 2023-02-16 NOTE — Progress Notes (Unsigned)
Jeremy [name], male    DOB: 05-Oct-1935,    MRN: [id number]   Brief patient profile:  4  yowm quit smoking 1990 p MI  referred to pulmonary clinic in [place]  10/13/2021 by Dr Margo Aye for copd eval with Nov 2022 > ER with dx aecopd with GOLD 2 criteria in 2017 but not requiring any maint rx up until Nov 2022     History of Present Illness  10/13/2021  Pulmonary/ 1st office eval/ Nicklas Mcsweeney / [name] / on ACEi/breztri  Chief Complaint  Patient presents with   Consult    Patient had covid in 2019. Patient states that he does feel better now. States that he really needed to be seen a month ago but is better now. States that he is able to walk longer distance now than before.   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 Much worse in am's historically but better now  Cough: none but still urge to clear his throat Sleep: bed blocks/ one pillow SABA use: much less now breztri one bid / maybe one albuterol hfa/ neb one each am  Rec Ok to continue lisinopril but it may be part of the problem with your throat (clearing, choking, gagging, something  stuck, problem swallowing)   Plan A = Automatic = Always=    Breztri Take 1-2 puffs first thing in am and then another 2 puffs about 12 hours later.   Work on inhaler technique:   Plan B = Backup (to supplement plan A, not to replace it)Only use your albuterol inhaler as a rescue medication to be used if you can't catch your breath by resting or doing a relaxed purse lip pattern.  Plan C = Crisis (instead of Plan B but only if Plan B stops working) - only use your albuterol nebulizer if you first try Plan B   GERD diet reviewed, bed blocks rec    Admit date: 11/22/2021 Discharge date: 11/27/2021 Discharge Condition:stable Discharge Diagnoses:  Principal Problem:   COPD exacerbation (HCC)   Essential hypertension   Hyperlipidemia   CAD (coronary artery disease)   COPD GOLD 2    Reflux esophagitis    Hypokalemia   COPD exacerbation ([facility]CC) vs ACEi case  Continue breztri on discharge Continue DuoNebs Treated with solumedrol, transition to prednisone taper on discharge Continue Mucinex Completed doxycycline as inpatient Pulmonology following, appreciate assistance Lisinopril discontinued in favor of losartan    01/18/2023  f/u ov/[facility]/Iysis Germain re: GOLD 2 copd maint on breztri  Chief Complaint  Patient presents with   Acute Visit    Pt f/u states that he is having some increased SOB, which has lead to him doing neb treatments and the albuterol more frequently. He does feel like the   Dyspnea:  worse x 2 weeks  Cough: more cough esp in am slt discolored, then better by afternoon Sleeping: still wheezing some at hs per wife  SABA use: using neb sev times a week  02: none  Rec Prednisone 10 mg take  4 each am x 2 days,   2 each am x 2 days,  1 each am x 2 days and stop  Plan A = Automatic = Always=   stop breztri when your shipment arrives arrives:    Formoterol/budesonide first thing in am and repeat in am  Plan B = Backup (to supplement plan A, not to replace it) Only use your albuterol inhaler as  a rescue medication Plan C = Crisis (instead of Plan B but only if Plan B stops working) - only use your albuterol-ipatropium  nebulizer if you first try Plan B     02/17/2023  f/u ov/[facility]/Caterine Mcmeans re: GOLD 2 copd  maint on performist/bud  Chief Complaint  Patient presents with   Follow-up    Pt f/u states that he is doing well SOB is baseline and he is using his nebs twice daily. He does see a benefit in them  Dyspnea:  can walk for < 5 min slow but also slowed by L hip  Cough: not much  Sleeping: no more noct wheeze lying flat SABA use: none  02: none    No obvious day to day or daytime variability or assoc excess/ purulent sputum or mucus plugs or hemoptysis or cp or chest tightness, subjective wheeze or overt sinus or hb symptoms.   Sleeping  without nocturnal   or early am exacerbation  of respiratory  c/o's or need for noct saba. Also denies any obvious fluctuation of symptoms with weather or environmental changes or other aggravating or alleviating factors except as outlined above   No unusual exposure hx or h/o childhood pna/ asthma or knowledge of premature birth.  Current Allergies, Complete Past Medical History, Past Surgical History, Family History, and Social History were reviewed in [employer] record.  ROS  The following are not active complaints unless bolded Hoarseness, sore throat, dysphagia, dental problems, itching, sneezing,  nasal congestion or discharge of excess mucus or purulent secretions, ear ache,   fever, chills, sweats, unintended wt loss or wt gain, classically pleuritic or exertional cp,  orthopnea pnd or arm/hand swelling  or leg swelling, presyncope, palpitations, abdominal pain, anorexia, nausea, vomiting, diarrhea  or change in bowel habits or change in bladder habits, change in stools or change in urine, dysuria, hematuria,  rash, arthralgias, visual complaints, headache, numbness, weakness or ataxia or problems with walking or coordination,  change in mood or  memory.        Current Meds  Medication Sig   albuterol (PROVENTIL HFA;VENTOLIN HFA) 108 (90 Base) MCG/ACT inhaler Inhale 1-2 puffs into the lungs every 6 (six) hours as needed for wheezing or shortness of breath.   amLODipine (NORVASC) 10 MG tablet Take 5 mg by mouth daily.   aspirin 81 MG tablet Take 1 tablet (81 mg total) by mouth every evening.   atorvastatin (LIPITOR) 80 MG tablet Take 1 tablet (80 mg total) by mouth daily.   budesonide (PULMICORT) 0.25 MG/2ML nebulizer solution One vial twice daily with formoterol   Cholecalciferol (VITAMIN D3) 50 MCG (2000 UT) CHEW Chew 1 tablet by mouth daily.   cyanocobalamin 1000 MCG tablet Take 1,000 mcg by mouth daily.   diphenhydrAMINE (BENADRYL) 25 MG tablet Take 12.5 mg by mouth at bedtime as  needed for sleep.   formoterol (PERFOROMIST) 20 MCG/2ML nebulizer solution One vial every 12 hours   ipratropium-albuterol (DUONEB) 0.5-2.5 (3) MG/3ML SOLN Take 3 mLs by nebulization every 4 (four) hours as needed (Shortness of breath).   levocetirizine (XYZAL) 5 MG tablet Take 5 mg by mouth every evening.   losartan (COZAAR) 100 MG tablet Take 100 mg by mouth daily.   Menthol, Topical Analgesic, (ICY HOT EX) Apply 1 application topically daily as needed (pain).   nitroGLYCERIN (NITROSTAT) 0.4 MG SL tablet PLACE 1 TABLET UNDER THE TONGUE EVERY 5 MINUTES AS NEEDED FOR CHEST PAIN. MAX 3 TABLETS PER EVENT. CALL  911   pantoprazole (PROTONIX) 40 MG tablet Take 1 tablet (40 mg total) by mouth 2 (two) times daily.   predniSONE (DELTASONE) 10 MG tablet Take  4 each am x 2 days,   2 each am x 2 days,  1 each am x 2 days and stop   tamsulosin (FLOMAX) 0.4 MG CAPS capsule Take 0.4 mg by mouth at bedtime.   tiZANidine (ZANAFLEX) 2 MG tablet Take 1 tablet (2 mg total) by mouth every 6 (six) hours as needed for muscle spasms.            Past Medical History:  Diagnosis Date   Abnormal CXR 4/20011   Chest  CT mode/serere COPD ( no mediastinal abnormality)   CAD (coronary artery disease) 2005   anterior MI with stent to the LAD in 1991 / stent 1996 / nuclear 2005, no ischemia   Carotid bruit    Doppler, June, 2013, 0-39% bilateral   COPD (chronic obstructive pulmonary disease) (HCC)    Diverticula of colon    Ejection fraction    55-60%, echo, April, 2011, could not estimate RV pressure   Esophagitis    HOH (hard of hearing)    Hyperlipidemia    Hypertension    Myocardial infarction (HCC)    x2   Shortness of breath    with exertion   Sleep apnea    Stop Bang score of 5   Tobacco abuse         Objective:    Wts  02/17/2023         187 01/18/2023         190  11/03/2022       190   08/05/2022     193 02/11/2022       187   12/05/21 185 lb 9.6 oz (84.2 kg)  11/22/21 187 lb (84.8 kg)   10/13/21 187 lb (84.8 kg)    Vital signs reviewed  02/17/2023  - Note at rest 02 sats  90% on RA   General appearance:    amb pleasant wm nad   HEENT : Oropharynx  clear       NECK :  without  apparent JVD/ palpable Nodes/TM    LUNGS: no acc muscle use,  Mild barrel  contour chest wall with bilateral  Distant bs s audible wheeze and  without cough on insp or exp maneuvers  and mild  Hyperresonant  to  percussion bilaterally     CV:  RRR  no s3 or murmur or increase in P2, and no edema   ABD:  soft and nontender with pos end  insp Hoover's  in the supine position.  No bruits or organomegaly appreciated   MS:  Nl gait/ ext warm without deformities Or obvious joint restrictions  calf tenderness, cyanosis or clubbing     SKIN: warm and dry without lesions    NEURO:  alert, approp, nl sensorium with  no motor or cerebellar deficits apparent.      Assessment

## 2023-02-17 ENCOUNTER — Encounter: Payer: Self-pay | Admitting: Internal Medicine

## 2023-02-17 ENCOUNTER — Ambulatory Visit (INDEPENDENT_AMBULATORY_CARE_PROVIDER_SITE_OTHER): Payer: Medicare Other | Admitting: Internal Medicine

## 2023-02-17 VITALS — BP 106/67 | HR 79 | Ht 72.0 in | Wt 187.2 lb

## 2023-02-17 DIAGNOSIS — J449 Chronic obstructive pulmonary disease, unspecified: Secondary | ICD-10-CM | POA: Diagnosis not present

## 2023-02-17 MED ORDER — YUPELRI 175 MCG/3ML IN SOLN: 175.0000 ug | Freq: Every day | RESPIRATORY_TRACT | 0 refills | Status: DC

## 2023-02-17 NOTE — Patient Instructions (Signed)
Add yupelri  175 mcg   to see if you can walk faster and farther  - call after a couple of weeks if you're breathing is better   Please schedule a follow up visit in 3 months but call sooner if needed

## 2023-02-17 NOTE — Assessment & Plan Note (Signed)
Quit smoking 1990  - Spirometry 11/08/15   FEV1 1.89 (58%)  Ratio 0.49 p 10% improvement from saba  - 10/13/2021  After extensive coaching inhaler device,  effectiveness =    75% (short Ti)  - d/c acei 11/22/21 > all wheeze resolve by 12/05/2021 ov - PFT's  12/03/21  FEV1 1.76 (58 % ) ratio 0.42  p 10 % improvement from saba p 0 prior to study with DLCO  14.04 (55%) corrects to 2.66  (69%)  for alv volume and FV curve classically concave    - 11/03/2022  After extensive coaching inhaler device,  effectiveness =    80% (slt delayed trigger) - 01/18/2023 flared on breztri/  "neb is the only thing that helps"  with assoc throat clearing ? Adverse effects of hfa so rec change to neb formoterol/bud   - 02/17/2023 added yupelri    Group D (now reclassified as E) in terms of symptom/risk and laba/lama/ICS  therefore appropriate rx at this point >>>  formoterol/ budesonide 0.25 bid  and q am yupelri  trial to see if improves ex tol/ cautioned re anticholinergic side effects exp if combines with duoneb (not needing at present anyway)  or large doses of benadryl (uses very low dose)   F/u can b 3 m,   call sooner if needed          Each maintenance medication was reviewed in detail including emphasizing most importantly the difference between maintenance and prns and under what circumstances the prns are to be triggered using an action plan format where appropriate.  Total time for H and P, chart review, counseling, reviewing neb device(s) and generating customized AVS unique to this office visit / same day charting = 34 min

## 2023-03-15 ENCOUNTER — Other Ambulatory Visit (HOSPITAL_COMMUNITY): Payer: Self-pay | Admitting: Family Medicine

## 2023-03-15 ENCOUNTER — Ambulatory Visit (HOSPITAL_COMMUNITY)
Admission: RE | Admit: 2023-03-15 | Discharge: 2023-03-15 | Disposition: A | Discharge status: Home / Self Care | Payer: Medicare Other | Source: Ambulatory Visit | Attending: Family Medicine | Admitting: Family Medicine

## 2023-03-15 DIAGNOSIS — I82451 Acute embolism and thrombosis of right peroneal vein: Secondary | ICD-10-CM | POA: Diagnosis not present

## 2023-03-15 DIAGNOSIS — Z66 Do not resuscitate: Secondary | ICD-10-CM | POA: Diagnosis present

## 2023-03-15 DIAGNOSIS — R0602 Shortness of breath: Secondary | ICD-10-CM | POA: Diagnosis not present

## 2023-03-15 DIAGNOSIS — J9801 Acute bronchospasm: Secondary | ICD-10-CM | POA: Diagnosis present

## 2023-03-15 DIAGNOSIS — Z7982 Long term (current) use of aspirin: Secondary | ICD-10-CM | POA: Diagnosis not present

## 2023-03-15 DIAGNOSIS — I2510 Atherosclerotic heart disease of native coronary artery without angina pectoris: Secondary | ICD-10-CM | POA: Diagnosis not present

## 2023-03-15 DIAGNOSIS — J441 Chronic obstructive pulmonary disease with (acute) exacerbation: Secondary | ICD-10-CM

## 2023-03-15 DIAGNOSIS — E785 Hyperlipidemia, unspecified: Secondary | ICD-10-CM | POA: Diagnosis present

## 2023-03-15 DIAGNOSIS — R918 Other nonspecific abnormal finding of lung field: Secondary | ICD-10-CM | POA: Diagnosis not present

## 2023-03-15 DIAGNOSIS — I4510 Unspecified right bundle-branch block: Secondary | ICD-10-CM | POA: Diagnosis present

## 2023-03-15 DIAGNOSIS — M25422 Effusion, left elbow: Secondary | ICD-10-CM | POA: Diagnosis not present

## 2023-03-15 DIAGNOSIS — I10 Essential (primary) hypertension: Secondary | ICD-10-CM | POA: Diagnosis present

## 2023-03-15 DIAGNOSIS — M71822 Other specified bursopathies, left elbow: Secondary | ICD-10-CM | POA: Diagnosis not present

## 2023-03-15 DIAGNOSIS — Z85828 Personal history of other malignant neoplasm of skin: Secondary | ICD-10-CM | POA: Diagnosis not present

## 2023-03-15 DIAGNOSIS — Z79899 Other long term (current) drug therapy: Secondary | ICD-10-CM | POA: Diagnosis not present

## 2023-03-15 DIAGNOSIS — Z800 Family history of malignant neoplasm of digestive organs: Secondary | ICD-10-CM | POA: Diagnosis not present

## 2023-03-15 DIAGNOSIS — Z8249 Family history of ischemic heart disease and other diseases of the circulatory system: Secondary | ICD-10-CM | POA: Diagnosis not present

## 2023-03-15 DIAGNOSIS — I2699 Other pulmonary embolism without acute cor pulmonale: Secondary | ICD-10-CM | POA: Diagnosis not present

## 2023-03-15 DIAGNOSIS — I700 Atherosclerosis of aorta: Secondary | ICD-10-CM | POA: Diagnosis not present

## 2023-03-15 DIAGNOSIS — N400 Enlarged prostate without lower urinary tract symptoms: Secondary | ICD-10-CM | POA: Diagnosis present

## 2023-03-15 DIAGNOSIS — Z87891 Personal history of nicotine dependence: Secondary | ICD-10-CM | POA: Diagnosis not present

## 2023-03-15 DIAGNOSIS — I252 Old myocardial infarction: Secondary | ICD-10-CM | POA: Diagnosis not present

## 2023-03-15 DIAGNOSIS — I2609 Other pulmonary embolism with acute cor pulmonale: Secondary | ICD-10-CM | POA: Diagnosis not present

## 2023-03-15 DIAGNOSIS — Z720 Tobacco use: Secondary | ICD-10-CM | POA: Diagnosis not present

## 2023-03-15 DIAGNOSIS — Z1152 Encounter for screening for COVID-19: Secondary | ICD-10-CM | POA: Diagnosis not present

## 2023-03-15 DIAGNOSIS — Z7951 Long term (current) use of inhaled steroids: Secondary | ICD-10-CM | POA: Diagnosis not present

## 2023-03-15 DIAGNOSIS — K219 Gastro-esophageal reflux disease without esophagitis: Secondary | ICD-10-CM | POA: Diagnosis present

## 2023-03-15 DIAGNOSIS — J9601 Acute respiratory failure with hypoxia: Secondary | ICD-10-CM | POA: Diagnosis present

## 2023-03-15 DIAGNOSIS — J439 Emphysema, unspecified: Secondary | ICD-10-CM | POA: Diagnosis not present

## 2023-03-16 ENCOUNTER — Other Ambulatory Visit (HOSPITAL_COMMUNITY): Payer: Self-pay | Admitting: Family Medicine

## 2023-03-16 DIAGNOSIS — R0600 Dyspnea, unspecified: Secondary | ICD-10-CM

## 2023-03-17 ENCOUNTER — Encounter (HOSPITAL_COMMUNITY): Payer: Self-pay

## 2023-03-17 ENCOUNTER — Emergency Department (HOSPITAL_COMMUNITY): Payer: Medicare Other

## 2023-03-17 ENCOUNTER — Observation Stay (HOSPITAL_COMMUNITY): Payer: Medicare Other

## 2023-03-17 ENCOUNTER — Other Ambulatory Visit: Payer: Self-pay

## 2023-03-17 ENCOUNTER — Inpatient Hospital Stay (HOSPITAL_COMMUNITY)
Admission: EM | Admit: 2023-03-17 | Discharge: 2023-03-19 | DRG: 190 | Disposition: A | Discharge status: Home Health | Payer: Medicare Other | Attending: Family Medicine | Admitting: Family Medicine

## 2023-03-17 DIAGNOSIS — I82451 Acute embolism and thrombosis of right peroneal vein: Secondary | ICD-10-CM | POA: Diagnosis not present

## 2023-03-17 DIAGNOSIS — J9601 Acute respiratory failure with hypoxia: Secondary | ICD-10-CM | POA: Diagnosis present

## 2023-03-17 DIAGNOSIS — I2609 Other pulmonary embolism with acute cor pulmonale: Secondary | ICD-10-CM | POA: Diagnosis not present

## 2023-03-17 DIAGNOSIS — I2699 Other pulmonary embolism without acute cor pulmonale: Secondary | ICD-10-CM | POA: Diagnosis present

## 2023-03-17 DIAGNOSIS — Z85828 Personal history of other malignant neoplasm of skin: Secondary | ICD-10-CM

## 2023-03-17 DIAGNOSIS — I252 Old myocardial infarction: Secondary | ICD-10-CM

## 2023-03-17 DIAGNOSIS — Z66 Do not resuscitate: Secondary | ICD-10-CM | POA: Diagnosis present

## 2023-03-17 DIAGNOSIS — K219 Gastro-esophageal reflux disease without esophagitis: Secondary | ICD-10-CM | POA: Diagnosis present

## 2023-03-17 DIAGNOSIS — Z7982 Long term (current) use of aspirin: Secondary | ICD-10-CM

## 2023-03-17 DIAGNOSIS — I2510 Atherosclerotic heart disease of native coronary artery without angina pectoris: Secondary | ICD-10-CM | POA: Diagnosis present

## 2023-03-17 DIAGNOSIS — N400 Enlarged prostate without lower urinary tract symptoms: Secondary | ICD-10-CM | POA: Diagnosis present

## 2023-03-17 DIAGNOSIS — J439 Emphysema, unspecified: Secondary | ICD-10-CM | POA: Diagnosis not present

## 2023-03-17 DIAGNOSIS — I4510 Unspecified right bundle-branch block: Secondary | ICD-10-CM | POA: Diagnosis present

## 2023-03-17 DIAGNOSIS — J9801 Acute bronchospasm: Secondary | ICD-10-CM | POA: Diagnosis present

## 2023-03-17 DIAGNOSIS — Z7951 Long term (current) use of inhaled steroids: Secondary | ICD-10-CM

## 2023-03-17 DIAGNOSIS — E785 Hyperlipidemia, unspecified: Secondary | ICD-10-CM | POA: Diagnosis present

## 2023-03-17 DIAGNOSIS — Z1152 Encounter for screening for COVID-19: Secondary | ICD-10-CM

## 2023-03-17 DIAGNOSIS — J441 Chronic obstructive pulmonary disease with (acute) exacerbation: Principal | ICD-10-CM | POA: Diagnosis present

## 2023-03-17 DIAGNOSIS — Z79899 Other long term (current) drug therapy: Secondary | ICD-10-CM

## 2023-03-17 DIAGNOSIS — R0602 Shortness of breath: Secondary | ICD-10-CM | POA: Diagnosis not present

## 2023-03-17 DIAGNOSIS — Z720 Tobacco use: Secondary | ICD-10-CM

## 2023-03-17 DIAGNOSIS — I10 Essential (primary) hypertension: Secondary | ICD-10-CM | POA: Diagnosis present

## 2023-03-17 DIAGNOSIS — Z8249 Family history of ischemic heart disease and other diseases of the circulatory system: Secondary | ICD-10-CM

## 2023-03-17 DIAGNOSIS — I700 Atherosclerosis of aorta: Secondary | ICD-10-CM | POA: Diagnosis not present

## 2023-03-17 DIAGNOSIS — E782 Mixed hyperlipidemia: Secondary | ICD-10-CM | POA: Diagnosis present

## 2023-03-17 DIAGNOSIS — Z800 Family history of malignant neoplasm of digestive organs: Secondary | ICD-10-CM

## 2023-03-17 LAB — TROPONIN I (HIGH SENSITIVITY)
Troponin I (High Sensitivity): 3 ng/L (ref <18)
Troponin I (High Sensitivity): 3 ng/L (ref <18)

## 2023-03-17 LAB — BASIC METABOLIC PANEL
Anion gap: 8 (ref 5–15)
BUN: 13 mg/dL (ref 8–23)
CO2: 27 mmol/L (ref 22–32)
Calcium: 8.6 mg/dL — ABNORMAL LOW (ref 8.9–10.3)
Chloride: 104 mmol/L (ref 98–111)
Creatinine, Ser: 1.25 mg/dL — ABNORMAL HIGH (ref 0.61–1.24)
GFR, Estimated: 56 mL/min — ABNORMAL LOW (ref >60)
Glucose, Bld: 117 mg/dL — ABNORMAL HIGH (ref 70–99)
Potassium: 4.2 mmol/L (ref 3.5–5.1)
Sodium: 139 mmol/L (ref 135–145)

## 2023-03-17 LAB — CBC WITH DIFFERENTIAL/PLATELET
Abs Immature Granulocytes: 0.02 10*3/uL (ref 0.00–0.07)
Basophils Absolute: 0 10*3/uL (ref 0.0–0.1)
Basophils Relative: 1 %
Eosinophils Absolute: 0.3 10*3/uL (ref 0.0–0.5)
Eosinophils Relative: 6 %
HCT: 41.5 % (ref 39.0–52.0)
Hemoglobin: 13.3 g/dL (ref 13.0–17.0)
Immature Granulocytes: 1 %
Lymphocytes Relative: 21 %
Lymphs Abs: 0.9 10*3/uL (ref 0.7–4.0)
MCH: 29.3 pg (ref 26.0–34.0)
MCHC: 32 g/dL (ref 30.0–36.0)
MCV: 91.4 fL (ref 80.0–100.0)
Monocytes Absolute: 0.3 10*3/uL (ref 0.1–1.0)
Monocytes Relative: 7 %
Neutro Abs: 2.8 10*3/uL (ref 1.7–7.7)
Neutrophils Relative %: 64 %
Platelets: 159 10*3/uL (ref 150–400)
RBC: 4.54 MIL/uL (ref 4.22–5.81)
RDW: 14.4 % (ref 11.5–15.5)
WBC: 4.3 10*3/uL (ref 4.0–10.5)
nRBC: 0 % (ref 0.0–0.2)

## 2023-03-17 LAB — ECHOCARDIOGRAM COMPLETE
AR max vel: 1.87 cm2
AV Area VTI: 1.98 cm2
AV Area mean vel: 1.97 cm2
AV Mean grad: 7.6 mmHg
AV Peak grad: 16.4 mmHg
Ao pk vel: 2.02 m/s
Area-P 1/2: 5.42 cm2
Height: 72 in
S' Lateral: 3.4 cm
Weight: 2994.73 oz

## 2023-03-17 LAB — BLOOD GAS, VENOUS
Acid-Base Excess: 5.8 mmol/L — ABNORMAL HIGH (ref 0.0–2.0)
Bicarbonate: 31.7 mmol/L — ABNORMAL HIGH (ref 20.0–28.0)
Drawn by: 44828
O2 Saturation: 43.5 %
Patient temperature: 36.7
pCO2, Ven: 49 mmHg (ref 44–60)
pH, Ven: 7.41 (ref 7.25–7.43)
pO2, Ven: <31 mmHg — CL (ref 32–45)

## 2023-03-17 LAB — RESP PANEL BY RT-PCR (RSV, FLU A&B, COVID)  RVPGX2
Influenza A by PCR: NEGATIVE
Influenza B by PCR: NEGATIVE
Resp Syncytial Virus by PCR: NEGATIVE
SARS Coronavirus 2 by RT PCR: NEGATIVE

## 2023-03-17 LAB — BRAIN NATRIURETIC PEPTIDE: B Natriuretic Peptide: 21 pg/mL (ref 0.0–100.0)

## 2023-03-17 MED ORDER — ACETAMINOPHEN 325 MG PO TABS
650.0000 mg | ORAL_TABLET | Freq: Four times a day (QID) | ORAL | Status: DC | PRN
  Administered 2023-03-18: 650 mg via ORAL
  Filled 2023-03-17 (×2): qty 2

## 2023-03-17 MED ORDER — ALBUTEROL SULFATE (2.5 MG/3ML) 0.083% IN NEBU
2.5000 mg | INHALATION_SOLUTION | RESPIRATORY_TRACT | Status: DC | PRN
  Administered 2023-03-18: 2.5 mg via RESPIRATORY_TRACT
  Filled 2023-03-17: qty 3

## 2023-03-17 MED ORDER — IPRATROPIUM-ALBUTEROL 0.5-2.5 (3) MG/3ML IN SOLN: 3.0000 mL | Freq: Once | RESPIRATORY_TRACT | Status: AC

## 2023-03-17 MED ORDER — AZITHROMYCIN 250 MG PO TABS
500.0000 mg | ORAL_TABLET | Freq: Once | ORAL | Status: AC
  Administered 2023-03-17: 500 mg via ORAL
  Filled 2023-03-17: qty 2

## 2023-03-17 MED ORDER — METHYLPREDNISOLONE SODIUM SUCC 40 MG IJ SOLR
40.0000 mg | Freq: Two times a day (BID) | INTRAMUSCULAR | Status: DC
  Administered 2023-03-17 – 2023-03-19 (×4): 40 mg via INTRAVENOUS
  Filled 2023-03-17 (×4): qty 1

## 2023-03-17 MED ORDER — ALBUTEROL SULFATE (2.5 MG/3ML) 0.083% IN NEBU
2.5000 mg | INHALATION_SOLUTION | Freq: Once | RESPIRATORY_TRACT | Status: AC
  Administered 2023-03-17: 2.5 mg via RESPIRATORY_TRACT
  Filled 2023-03-17: qty 3

## 2023-03-17 MED ORDER — SODIUM CHLORIDE 0.9% FLUSH
3.0000 mL | Freq: Two times a day (BID) | INTRAVENOUS | Status: DC
  Administered 2023-03-17 – 2023-03-19 (×3): 3 mL via INTRAVENOUS

## 2023-03-17 MED ORDER — IPRATROPIUM-ALBUTEROL 0.5-2.5 (3) MG/3ML IN SOLN
RESPIRATORY_TRACT | Status: AC
  Administered 2023-03-17: 3 mL via RESPIRATORY_TRACT
  Filled 2023-03-17: qty 3

## 2023-03-17 MED ORDER — HYDRALAZINE HCL 20 MG/ML IJ SOLN: 10.0000 mg | INTRAMUSCULAR | Status: DC | PRN

## 2023-03-17 MED ORDER — AZITHROMYCIN 250 MG PO TABS
250.0000 mg | ORAL_TABLET | Freq: Every day | ORAL | Status: DC
  Administered 2023-03-18 – 2023-03-19 (×2): 250 mg via ORAL
  Filled 2023-03-17 (×2): qty 1

## 2023-03-17 MED ORDER — IPRATROPIUM-ALBUTEROL 0.5-2.5 (3) MG/3ML IN SOLN
3.0000 mL | Freq: Three times a day (TID) | RESPIRATORY_TRACT | Status: DC
  Administered 2023-03-17: 3 mL via RESPIRATORY_TRACT
  Filled 2023-03-17: qty 3

## 2023-03-17 MED ORDER — APIXABAN 5 MG PO TABS
10.0000 mg | ORAL_TABLET | Freq: Two times a day (BID) | ORAL | Status: DC
  Administered 2023-03-17 – 2023-03-19 (×5): 10 mg via ORAL
  Filled 2023-03-17 (×5): qty 2

## 2023-03-17 MED ORDER — IPRATROPIUM-ALBUTEROL 0.5-2.5 (3) MG/3ML IN SOLN
3.0000 mL | Freq: Three times a day (TID) | RESPIRATORY_TRACT | Status: DC
  Administered 2023-03-18 – 2023-03-19 (×4): 3 mL via RESPIRATORY_TRACT
  Filled 2023-03-17 (×5): qty 3

## 2023-03-17 MED ORDER — APIXABAN 5 MG PO TABS: 5.0000 mg | ORAL_TABLET | Freq: Two times a day (BID) | ORAL | Status: DC

## 2023-03-17 MED ORDER — METHYLPREDNISOLONE SODIUM SUCC 125 MG IJ SOLR
125.0000 mg | Freq: Once | INTRAMUSCULAR | Status: AC
  Administered 2023-03-17: 125 mg via INTRAVENOUS
  Filled 2023-03-17: qty 2

## 2023-03-17 MED ORDER — SODIUM CHLORIDE 0.9 % IV SOLN: INTRAVENOUS | Status: DC

## 2023-03-17 MED ORDER — MAGNESIUM SULFATE 2 GM/50ML IV SOLN
2.0000 g | Freq: Once | INTRAVENOUS | Status: AC
  Administered 2023-03-17: 2 g via INTRAVENOUS
  Filled 2023-03-17: qty 50

## 2023-03-17 MED ORDER — IOHEXOL 350 MG/ML SOLN
75.0000 mL | Freq: Once | INTRAVENOUS | Status: AC | PRN
  Administered 2023-03-17: 75 mL via INTRAVENOUS

## 2023-03-17 MED ORDER — ACETAMINOPHEN 650 MG RE SUPP: 650.0000 mg | Freq: Four times a day (QID) | RECTAL | Status: DC | PRN

## 2023-03-17 NOTE — ED Triage Notes (Signed)
Pt comes in from home with complaints of SOB starting 3 days ago. Pt states hx of COPD. Pt went to his PCP and after a chest xray ordered a CT but is not scheduled for 10 days. Pt states having pain between his shoulder blades starting yesterday and worse this morning.

## 2023-03-17 NOTE — ED Provider Notes (Signed)
[place] EMERGENCY DEPARTMENT AT [facility] Provider Note   CSN: [id number] Arrival date & time: 03/17/23  4098     History  No chief complaint on file.   Jeremy [name] Jeremy [name] is a 87 y.o. male.  He has a history of coronary disease COPD former smoker.  He has had increased shortness of breath cough productive of some clear sputum dyspnea on exertion for the last 4 to 5 days.  He saw Dr. Margo Aye and was given Jerilynn Som.  He is continue to use his nebulizer.  Worsening shortness of breath today so decided to present to ED.  Per patient's wife patient had an abnormal chest x-ray with Dr. Margo Aye and they wanted to get a CAT scan but have not gotten 1 yet.  No hemoptysis no fever no abdominal pain vomiting diarrhea.  The history is provided by the patient and the spouse.  Shortness of Breath Severity:  Severe Onset quality:  Gradual Duration:  5 days Timing:  Constant Progression:  Worsening Chronicity:  New Relieved by:  Nothing Worsened by:  Activity and coughing Ineffective treatments:  Inhaler Associated symptoms: cough and wheezing   Associated symptoms: no abdominal pain, no chest pain, no fever, no hemoptysis and no sputum production        Home Medications Prior to Admission medications   Medication Sig Start Date End Date Taking? Authorizing Provider  albuterol (PROVENTIL HFA;VENTOLIN HFA) 108 (90 Base) MCG/ACT inhaler Inhale 1-2 puffs into the lungs every 6 (six) hours as needed for wheezing or shortness of breath.    [provider]  amLODipine (NORVASC) 10 MG tablet Take 5 mg by mouth daily. 01/26/22   [provider]  aspirin 81 MG tablet Take 1 tablet (81 mg total) by mouth every evening. 05/09/21   Regalado, Belkys A, MD  atorvastatin (LIPITOR) 80 MG tablet Take 1 tablet (80 mg total) by mouth daily. 10/12/22   Antoine Poche, MD  budesonide (PULMICORT) 0.25 MG/2ML nebulizer solution One vial twice daily with formoterol 01/18/23   Nyoka Cowden, MD  Cholecalciferol (VITAMIN D3) 50 MCG (2000 UT) CHEW Chew 1 tablet by mouth daily.    [provider]  cyanocobalamin 1000 MCG tablet Take 1,000 mcg by mouth daily.    [provider]  diphenhydrAMINE (BENADRYL) 25 MG tablet Take 12.5 mg by mouth at bedtime as needed for sleep.    [provider]  formoterol (PERFOROMIST) 20 MCG/2ML nebulizer solution One vial every 12 hours 01/18/23   Nyoka Cowden, MD  ipratropium-albuterol (DUONEB) 0.5-2.5 (3) MG/3ML SOLN Take 3 mLs by nebulization every 4 (four) hours as needed (Shortness of breath). 09/18/21   [provider]  levocetirizine (XYZAL) 5 MG tablet Take 5 mg by mouth every evening.    [provider]  losartan (COZAAR) 100 MG tablet Take 100 mg by mouth daily. 01/02/22   [provider]  Menthol, Topical Analgesic, (ICY HOT EX) Apply 1 application topically daily as needed (pain).    [provider]  nitroGLYCERIN (NITROSTAT) 0.4 MG SL tablet PLACE 1 TABLET UNDER THE TONGUE EVERY 5 MINUTES AS NEEDED FOR CHEST PAIN. MAX 3 TABLETS PER EVENT. CALL 911 12/24/22   Antoine Poche, MD  pantoprazole (PROTONIX) 40 MG tablet Take 1 tablet (40 mg total) by mouth 2 (two) times daily. 10/12/22   Antoine Poche, MD  predniSONE (DELTASONE) 10 MG tablet Take  4 each am x 2 days,   2 each  am x 2 days,  1 each am x 2 days and stop 01/18/23   Nyoka Cowden, MD  revefenacin (YUPELRI) 175 MCG/3ML nebulizer solution Take 3 mLs (175 mcg total) by nebulization daily. 02/17/23   Nyoka Cowden, MD  tamsulosin (FLOMAX) 0.4 MG CAPS capsule Take 0.4 mg by mouth at bedtime. 07/17/22   [provider]  tiZANidine (ZANAFLEX) 2 MG tablet Take 1 tablet (2 mg total) by mouth every 6 (six) hours as needed for muscle spasms. 09/08/22   Pollyann Savoy, MD      Allergies    Patient has no known allergies.    Review of Systems   Review of Systems  Constitutional:  Negative for fever.   Respiratory:  Positive for cough, shortness of breath and wheezing. Negative for hemoptysis and sputum production.   Cardiovascular:  Negative for chest pain.  Gastrointestinal:  Negative for abdominal pain.    Physical Exam Updated Vital Signs BP (!) 163/94 (BP Location: Left Arm)   Pulse (!) 103   Temp 98 F (36.7 C) (Oral)   Resp (!) 22   SpO2 92%  Physical Exam Vitals and nursing note reviewed.  Constitutional:      General: He is not in acute distress.    Appearance: Normal appearance. He is well-developed.  HENT:     Head: Normocephalic and atraumatic.  Eyes:     Conjunctiva/sclera: Conjunctivae normal.  Cardiovascular:     Rate and Rhythm: Regular rhythm. Tachycardia present.     Heart sounds: No murmur heard. Pulmonary:     Effort: Tachypnea and accessory muscle usage present. No respiratory distress.     Breath sounds: Wheezing present.  Abdominal:     Palpations: Abdomen is soft.     Tenderness: There is no abdominal tenderness. There is no guarding or rebound.  Musculoskeletal:        General: Normal range of motion.     Cervical back: Neck supple.     Right lower leg: No edema.     Left lower leg: No edema.  Skin:    General: Skin is warm and dry.     Capillary Refill: Capillary refill takes less than 2 seconds.  Neurological:     General: No focal deficit present.     Mental Status: He is alert.     ED Results / Procedures / Treatments   Labs (all labs ordered are listed, but only abnormal results are displayed) Labs Reviewed  BASIC METABOLIC PANEL - Abnormal; Notable for the following components:      Result Value   Glucose, Bld 117 (*)    Creatinine, Ser 1.25 (*)    Calcium 8.6 (*)    GFR, Estimated 56 (*)    All other components within normal limits  BLOOD GAS, VENOUS - Abnormal; Notable for the following components:   pO2, Ven <31 (*)    Bicarbonate 31.7 (*)    Acid-Base Excess 5.8 (*)    All other components within normal limits  RESP  PANEL BY RT-PCR (RSV, FLU A&B, COVID)  RVPGX2  CBC WITH DIFFERENTIAL/PLATELET  BRAIN NATRIURETIC PEPTIDE  TROPONIN I (HIGH SENSITIVITY)  TROPONIN I (HIGH SENSITIVITY)    EKG EKG Interpretation Date/Time:  Wednesday March 17 2023 10:09:46 EDT Ventricular Rate:  94 PR Interval:  191 QRS Duration:  141 QT Interval:  386 QTC Calculation: 475 R Axis:   -37  Text Interpretation: Sinus rhythm Atrial premature complex Consider left atrial enlargement Right bundle branch  block Probable inferior infarct, recent Confirmed by Meridee Score [phone]) on 03/17/2023 10:23:27 AM  Radiology ECHOCARDIOGRAM COMPLETE  Result Date: 03/17/2023    ECHOCARDIOGRAM REPORT   Patient Name:   SHERARD SCATTERGOOD [facility] Date of Exam: 03/17/2023 Medical Rec #:  [id number]      Height:       72.0 in Accession #:    [id number]     Weight:       187.2 lb Date of Birth:  [birth year]      BSA:          2.071 m Patient Age:    87 years       BP:           137/73 mmHg Patient Gender: M              HR:           98 bpm. [facility] Procedure: 2D Echo, Cardiac Doppler and Color Doppler Indications:    Pulmonary Embolus I26.09  History:        Patient has prior history of Echocardiogram examinations, most                 recent 04/01/2022. Risk Factors:Hypertension, Dyslipidemia and                 Former Smoker.  Sonographer:    Aron Baba Referring Phys: [id number] Tyrone Nine  Sonographer Comments: Image acquisition challenging due to respiratory motion and Image acquisition challenging due to patient body habitus. IMPRESSIONS  1. Left ventricular ejection fraction, by estimation, is 65 to 70%. The left ventricle has normal function. Left ventricular endocardial border not optimally defined to evaluate regional wall motion. There is mild left ventricular hypertrophy. Left ventricular diastolic parameters are indeterminate.  2. RV not well visualized. Grossly appears mildly enlarged with normal function. . Right ventricular systolic  function was not well visualized. The right ventricular size is not well visualized. Tricuspid regurgitation signal is inadequate for assessing  PA pressure.  3. The mitral valve was not well visualized. Trivial mitral valve regurgitation. No evidence of mitral stenosis.  4. The aortic valve was not well visualized. Aortic valve regurgitation is not visualized. No aortic stenosis is present. FINDINGS  Left Ventricle: Left ventricular ejection fraction, by estimation, is 65 to 70%. The left ventricle has normal function. Left ventricular endocardial border not optimally defined to evaluate regional wall motion. The left ventricular internal cavity size was normal in size. There is mild left ventricular hypertrophy. Left ventricular diastolic parameters are indeterminate. Right Ventricle: RV not well visualized. Grossly appears mildly enlarged with normal function. The right ventricular size is not well visualized. Right vetricular wall thickness was not well visualized. Right ventricular systolic function was not well visualized. Tricuspid regurgitation signal is inadequate for assessing PA pressure. Left Atrium: Left atrial size was normal in size. Right Atrium: Right atrial size was not well visualized. Pericardium: The pericardium was not well visualized. Mitral Valve: The mitral valve was not well visualized. Trivial mitral valve regurgitation. No evidence of mitral valve stenosis. Tricuspid Valve: The tricuspid valve is not well visualized. Tricuspid valve regurgitation is not demonstrated. No evidence of tricuspid stenosis. Aortic Valve: The aortic valve was not well visualized. Aortic valve regurgitation is not visualized. No aortic stenosis is present. Aortic valve mean gradient measures 7.6 mmHg. Aortic valve peak gradient measures 16.4 mmHg. Aortic valve area, by VTI measures 1.98 cm. Pulmonic Valve: The pulmonic valve was not well  visualized. Pulmonic valve regurgitation is not visualized. No evidence of  pulmonic stenosis. Aorta: The aortic root and ascending aorta are structurally normal, with no evidence of dilitation. Venous: The inferior vena cava was not well visualized. IAS/Shunts: The interatrial septum was not well visualized.  LEFT VENTRICLE PLAX 2D LVIDd:         4.90 cm   Diastology LVIDs:         3.40 cm   LV e' medial:    4.35 cm/s LV PW:         1.20 cm   LV E/e' medial:  37.7 LV IVS:        1.10 cm   LV e' lateral:   10.00 cm/s LVOT diam:     2.20 cm   LV E/e' lateral: 16.4 LV SV:         65 LV SV Index:   32 LVOT Area:     3.80 cm  RIGHT VENTRICLE RV S prime:     15.10 cm/s TAPSE (M-mode): 1.6 cm LEFT ATRIUM           Index        RIGHT ATRIUM           Index LA diam:      4.20 cm 2.03 cm/m   RA Area:     21.50 cm LA Vol (A2C): 38.5 ml 18.59 ml/m  RA Volume:   65.70 ml  31.72 ml/m LA Vol (A4C): 34.3 ml 16.56 ml/m  AORTIC VALVE AV Area (Vmax):    1.87 cm AV Area (Vmean):   1.97 cm AV Area (VTI):     1.98 cm AV Vmax:           202.25 cm/s AV Vmean:          127.330 cm/s AV VTI:            0.330 m AV Peak Grad:      16.4 mmHg AV Mean Grad:      7.6 mmHg LVOT Vmax:         99.40 cm/s LVOT Vmean:        66.000 cm/s LVOT VTI:          0.172 m LVOT/AV VTI ratio: 0.52  AORTA Ao Root diam: 3.70 cm Ao Asc diam:  3.20 cm MITRAL VALVE MV Area (PHT): 5.42 cm     SHUNTS MV Decel Time: 140 msec     Systemic VTI:  0.17 m MV E velocity: 164.00 cm/s  Systemic Diam: 2.20 cm MV A velocity: 47.40 cm/s MV E/A ratio:  3.46 Dina Rich MD Electronically signed by Dina Rich MD Signature Date/Time: 03/17/2023/4:25:45 PM    Final    US Venous Img Lower Bilateral (DVT)  Result Date: 03/17/2023 CLINICAL DATA:  Pulmonary embolism EXAM: BILATERAL LOWER EXTREMITY VENOUS DOPPLER ULTRASOUND TECHNIQUE: Gray-scale sonography with graded compression, as well as color Doppler and duplex ultrasound were performed to evaluate the lower extremity deep venous systems from the level of the common femoral vein and  including the common femoral, femoral, profunda femoral, popliteal and calf veins including the posterior tibial, peroneal and gastrocnemius veins when visible. The superficial great saphenous vein was also interrogated. Spectral Doppler was utilized to evaluate flow at rest and with distal augmentation maneuvers in the common femoral, femoral and popliteal veins. COMPARISON:  No prior right lower extremity ultrasound, 07/31/2014 left lower extremity ultrasound FINDINGS: RIGHT LOWER EXTREMITY Common Femoral Vein: No evidence of thrombus. Normal compressibility, respiratory phasicity  and response to augmentation. Saphenofemoral Junction: No evidence of thrombus. Normal compressibility and flow on color Doppler imaging. Profunda Femoral Vein: No evidence of thrombus. Normal compressibility and flow on color Doppler imaging. Femoral Vein: No evidence of thrombus. Normal compressibility, respiratory phasicity and response to augmentation. Popliteal Vein: No evidence of thrombus. Normal compressibility, respiratory phasicity and response to augmentation. Calf Veins: Occlusive thrombus in one of the right peroneal veins. The right posterior tibial vein and anterior tibial vein are patent, without evidence of thrombus. Superficial Great Saphenous Vein: No evidence of thrombus. Normal compressibility. Venous Reflux:  None. Other Findings:  None. LEFT LOWER EXTREMITY Common Femoral Vein: No evidence of thrombus. Normal compressibility, respiratory phasicity and response to augmentation. Saphenofemoral Junction: No evidence of thrombus. Normal compressibility and flow on color Doppler imaging. Profunda Femoral Vein: No evidence of thrombus. Normal compressibility and flow on color Doppler imaging. Femoral Vein: No evidence of thrombus. Normal compressibility, respiratory phasicity and response to augmentation. Popliteal Vein: No evidence of thrombus. Normal compressibility, respiratory phasicity and response to augmentation.  Calf Veins: No evidence of thrombus. Normal compressibility and flow on color Doppler imaging. Superficial Great Saphenous Vein: No evidence of thrombus. Normal compressibility. Venous Reflux:  None. Other Findings:  None. IMPRESSION: 1. Occlusive thrombus in one of the right peroneal veins. 2. No evidence of deep venous thrombosis in the left lower extremity. Electronically Signed   By: Wiliam Ke M.D.   On: 03/17/2023 15:39   CT Angio Chest PE W/Cm &/Or Wo Cm  Result Date: 03/17/2023 CLINICAL DATA:  Pulmonary embolism suspected, high probability. Shortness of breath over the last 4 days. EXAM: CT ANGIOGRAPHY CHEST WITH CONTRAST TECHNIQUE: Multidetector CT imaging of the chest was performed using the standard protocol during bolus administration of intravenous contrast. Multiplanar CT image reconstructions and MIPs were obtained to evaluate the vascular anatomy. RADIATION DOSE REDUCTION: This exam was performed according to the departmental dose-optimization program which includes automated exposure control, adjustment of the mA and/or kV according to patient size and/or use of iterative reconstruction technique. CONTRAST:  75mL OMNIPAQUE IOHEXOL 350 MG/ML SOLN COMPARISON:  Chest radiography same day FINDINGS: Cardiovascular: Heart size upper limits of normal. No pericardial fluid. Extensive coronary artery calcification. Atherosclerotic disease of the aorta. Pulmonary arterial opacification is good. Study is positive for small emboli in the right lower lobe pulmonary arterial tree. No massive or submassive PE. Right ventricular to left ventricular ratio is 1.16, but this probably reflects chronic disease rather than being attributable to the single visible embolus in the right lower lobe. Mediastinum/Nodes: No mediastinal or hilar mass or lymphadenopathy. Lungs/Pleura: Advanced emphysema, diffuse but with upper lung predominance. No infiltrate, collapse or effusion. No mass or nodule in need of further  follow-up. Upper Abdomen: Negative Musculoskeletal: Ordinary chronic thoracic degenerative changes. Multilevel bridging osteophytes suggesting diffuse idiopathic skeletal hyperostosis. Old minor loss of height at T5. Review of the MIP images confirms the above findings. IMPRESSION: 1. Study is positive for small emboli in the right lower lobe pulmonary arterial tree. No massive or submassive PE. 2. Advanced emphysema, diffuse but with upper lung predominance. 3. Extensive coronary artery calcification. 4. These results were called by telephone at the time of interpretation on 03/17/2023 at 12:54 pm to provider [facility] , who verbally acknowledged these results. 1 Aortic Atherosclerosis (ICD10-I70.0) and Emphysema (ICD10-J43.9). Electronically Signed   By: Paulina Fusi M.D.   On: 03/17/2023 12:57   DG Chest Port 1 View  Result Date: 03/17/2023 CLINICAL DATA:  sob EXAM: PORTABLE CHEST 1 VIEW COMPARISON:  CXR 03/15/23 FINDINGS: No pleural effusion. No pneumothorax. No focal airspace opacity. Normal cardiac and mediastinal contours. No radiographically apparent displaced rib fractures. Visualized upper abdomen is unremarkable IMPRESSION: No focal airspace opacity. Electronically Signed   By: Lorenza Cambridge M.D.   On: 03/17/2023 11:03    Procedures .Critical Care  Performed by: Terrilee Files, MD Authorized by: Terrilee Files, MD   Critical care provider statement:    Critical care time (minutes):  45   Critical care time was exclusive of:  Separately billable procedures and treating other patients   Critical care was necessary to treat or prevent imminent or life-threatening deterioration of the following conditions:  Respiratory failure   Critical care was time spent personally by me on the following activities:  Development of treatment plan with patient or surrogate, discussions with consultants, evaluation of patient's response to treatment, examination of patient, obtaining history from patient  or surrogate, ordering and performing treatments and interventions, ordering and review of laboratory studies, ordering and review of radiographic studies, pulse oximetry, re-evaluation of patient's condition and review of old charts   I assumed direction of critical care for this patient from another provider in my specialty: no       Medications Ordered in ED Medications  apixaban (ELIQUIS) tablet 10 mg (10 mg Oral Given 03/17/23 1406)    Followed by  apixaban (ELIQUIS) tablet 5 mg (has no administration in time range)  methylPREDNISolone sodium succinate (SOLU-MEDROL) 40 mg/mL injection 40 mg (has no administration in time range)  ipratropium-albuterol (DUONEB) 0.5-2.5 (3) MG/3ML nebulizer solution 3 mL (has no administration in time range)  albuterol (PROVENTIL) (2.5 MG/3ML) 0.083% nebulizer solution 2.5 mg (has no administration in time range)  sodium chloride flush (NS) 0.9 % injection 3 mL (has no administration in time range)  acetaminophen (TYLENOL) tablet 650 mg (has no administration in time range)    Or  acetaminophen (TYLENOL) suppository 650 mg (has no administration in time range)  hydrALAZINE (APRESOLINE) injection 10 mg (has no administration in time range)  azithromycin (ZITHROMAX) tablet 500 mg (500 mg Oral Given 03/17/23 1656)    Followed by  azithromycin (ZITHROMAX) tablet 250 mg (has no administration in time range)  ipratropium-albuterol (DUONEB) 0.5-2.5 (3) MG/3ML nebulizer solution 3 mL (3 mLs Nebulization Given 03/17/23 1021)  magnesium sulfate IVPB 2 g 50 mL (0 g Intravenous Stopped 03/17/23 1156)  methylPREDNISolone sodium succinate (SOLU-MEDROL) 125 mg/2 mL injection 125 mg (125 mg Intravenous Given 03/17/23 1056)  albuterol (PROVENTIL) (2.5 MG/3ML) 0.083% nebulizer solution 2.5 mg (2.5 mg Nebulization Given 03/17/23 1025)  iohexol (OMNIPAQUE) 350 MG/ML injection 75 mL (75 mLs Intravenous Contrast Given 03/17/23 1249)    ED Course/ Medical Decision Making/ A&P Clinical  Course as of 03/17/23 1725  Wed Mar 17, 2023  1055 Chest x-ray interpreted by me as no definite infiltrate.  Awaiting radiology reading. [MB]  1300 Received a call from radiology.  Patient has a small PE. [MB]  1341 Discussed with Dr. Jarvis Newcomer [facility] hospitalist who will evaluate patient for admission. [MB]    Clinical Course User Index [MB] Terrilee Files, MD                             Medical Decision Making Amount and/or Complexity of Data Reviewed Labs: ordered. Radiology: ordered.  Risk OTC drugs. Prescription drug management. Decision regarding hospitalization.   This patient complains  of shortness of breath; this involves an extensive number of treatment Options and is a complaint that carries with it a high risk of complications and morbidity. The differential includes pneumonia, COPD, CHF, PE, pneumothorax  I ordered, reviewed and interpreted labs, which included CBC normal chemistries with mildly elevated creatinine, VBG without acidosis, COVID-negative, troponins flat BNP normal I ordered medication IV steroids and magnesium, breathing treatments, Eliquis and reviewed PMP when indicated. I ordered imaging studies which included chest x-ray and CT angio chest and I independently    visualized and interpreted imaging which showed COPD no infiltrate, small PE Additional history obtained from patient's wife Previous records obtained and reviewed in epic including recent PCP notes I consulted [facility] hospitalist Dr. Jarvis Newcomer and discussed lab and imaging findings and discussed disposition.  Cardiac monitoring reviewed, sinus tachycardia Social determinants considered, tobacco use Critical Interventions: Workup and initiation of blood thinners for PE  After the interventions stated above, I reevaluated the patient and found patient to be symptomatically improved although still borderline hypoxia and tachycardic Admission and further testing considered, patient will benefit from  mission the hospital for further management.  Patient in agreement with plan for admission.         Final Clinical Impression(s) / ED Diagnoses Final diagnoses:  COPD exacerbation (HCC)  Acute pulmonary embolism, unspecified pulmonary embolism type, unspecified whether acute cor pulmonale present [facility])    Rx / DC Orders ED Discharge Orders     None         Terrilee Files, MD 03/17/23 1727

## 2023-03-17 NOTE — ED Notes (Signed)
ED TO INPATIENT HANDOFF REPORT  ED Nurse Name and Phone #:   [phone] Cipriano Mile Name/Age/Gender Jeremy [name] 87 y.o. male Room/Bed: APA02/APA02  Code Status   Code Status: Prior  Home/SNF/Other Home Patient oriented to: self, place, time, and situation Is this baseline? Yes   Triage Complete: Triage complete  Chief Complaint COPD exacerbation (HCC) [J44.1]  Triage Note Pt comes in from home with complaints of SOB starting 3 days ago. Pt states hx of COPD. Pt went to his PCP and after a chest xray ordered a CT but is not scheduled for 10 days. Pt states having pain between his shoulder blades starting yesterday and worse this morning.    Allergies No Known Allergies  Level of Care/Admitting Diagnosis ED Disposition     ED Disposition  Admit   Condition  --   Comment  Hospital Area: [facility] [[id number]]  Level of Care: Med-Surg [16]  Covid Evaluation: Confirmed COVID Negative  Diagnosis: COPD exacerbation [facility]) [[id number]]  Admitting Physician: Tyrone Nine [[id number]]  Attending Physician: Tyrone Nine Samara.Cobb          B Medical/Surgery History Past Medical History:  Diagnosis Date   Abnormal CXR 4/20011   Chest  CT mode/serere COPD ( no mediastinal abnormality)   CAD (coronary artery disease) 2005   anterior MI with stent to the LAD in 1991 / stent 1996 / nuclear 2005, no ischemia   Carotid bruit    Doppler, June, 2013, 0-39% bilateral   COPD (chronic obstructive pulmonary disease) (HCC)    Diverticula of colon    Ejection fraction    55-60%, echo, April, 2011, could not estimate RV pressure   Esophagitis    HOH (hard of hearing)    Hyperlipidemia    Hypertension    Myocardial infarction (HCC)    x2   Shortness of breath    with exertion   Sleep apnea    Stop Bang score of 5   Tobacco abuse    Past Surgical History:  Procedure Laterality Date   APPENDECTOMY     BIOPSY  11/17/2017   Procedure: BIOPSY;  Surgeon: Malissa Hippo,  MD;  [facility];  Service: Endoscopy;;  gastric   CARDIAC CATHETERIZATION     CATARACT EXTRACTION Left    CATARACT EXTRACTION W/PHACO Right 01/22/2014   Procedure: CATARACT EXTRACTION PHACO AND INTRAOCULAR LENS PLACEMENT (IOC);  Surgeon: Gemma Payor, MD;  Location: [facility];  Service: Ophthalmology;  Laterality: Right;  CDE 7.46   CHOLECYSTECTOMY     8 years ago [facility]   COLONOSCOPY  10/05/2012   Dr. Karilyn Cota: multiple diverticula at sigmoid colon, multiple polyps (tubular adenomas), small external hemorrhoids   COLONOSCOPY N/A 11/18/2017   Procedure: COLONOSCOPY;  Surgeon: Malissa Hippo, MD;  [facility];  Service: Endoscopy;  Laterality: N/A;   COLONOSCOPY WITH PROPOFOL N/A 05/07/2021   Procedure: COLONOSCOPY WITH PROPOFOL;  Surgeon: Malissa Hippo, MD;  [facility];  Service: Endoscopy;  Laterality: N/A;   CYST REMOVAL HAND     right wrist   ESOPHAGEAL DILATION N/A 05/23/2015   Procedure: ESOPHAGEAL DILATION;  Surgeon: Corbin Ade, MD;  [facility];  Service: Endoscopy;  Laterality: N/A;   ESOPHAGEAL DILATION N/A 11/17/2017   Procedure: ESOPHAGEAL DILATION;  Surgeon: Malissa Hippo, MD;  [facility];  Service: Endoscopy;  Laterality: N/A;   ESOPHAGEAL DILATION N/A 05/07/2021   Procedure: ESOPHAGEAL DILATION;  Surgeon: Malissa Hippo, MD;  [facility];  Service: Endoscopy;  Laterality: N/A;   ESOPHAGOGASTRODUODENOSCOPY N/A 05/23/2015   Dr. Jena Gauss: Ulcerative/ erosive reflux esophagitis with peptic stricture formation. Status post dilation as described. Hiatal hernia. Abnormal gastric mucosa of uncertain significance status post gastric biospy. +H.pylori   ESOPHAGOGASTRODUODENOSCOPY N/A 11/17/2017   Procedure: ESOPHAGOGASTRODUODENOSCOPY (EGD);  Surgeon: Malissa Hippo, MD;  [facility];  Service: Endoscopy;  Laterality: N/A;   ESOPHAGOGASTRODUODENOSCOPY (EGD) WITH PROPOFOL N/A 05/07/2021   Procedure:  ESOPHAGOGASTRODUODENOSCOPY (EGD) WITH PROPOFOL;  Surgeon: Malissa Hippo, MD;  [facility];  Service: Endoscopy;  Laterality: N/A;   PERCUTANEOUS STENT INTERVENTION     SKIN CANCER EXCISION  2015   ear & neck   TONSILLECTOMY       A IV Location/Drains/Wounds Patient Lines/Drains/Airways Status     Active Line/Drains/Airways     Name Placement date Placement time Site Days   Peripheral IV 03/17/23 20 G Anterior;Right Forearm 03/17/23  1047  Forearm  less than 1            Intake/Output Last 24 hours No intake or output data in the 24 hours ending 03/17/23 1451  Labs/Imaging Results for orders placed or performed during the hospital encounter of 03/17/23 (from the past 48 hour(s))  Basic metabolic panel     Status: Abnormal   Collection Time: 03/17/23 10:38 AM  Result Value Ref Range   Sodium 139 135 - 145 mmol/L   Potassium 4.2 3.5 - 5.1 mmol/L   Chloride 104 98 - 111 mmol/L   CO2 27 22 - 32 mmol/L   Glucose, Bld 117 (H) 70 - 99 mg/dL    Comment: Glucose reference range applies only to samples taken after fasting for at least 8 hours.   BUN 13 8 - 23 mg/dL   Creatinine, Ser 0.98 (H) 0.61 - 1.24 mg/dL   Calcium 8.6 (L) 8.9 - 10.3 mg/dL   GFR, Estimated 56 (L) >60 mL/min    Comment: (NOTE) Calculated using the CKD-EPI Creatinine Equation (2021)    Anion gap 8 5 - 15    Comment: Performed at [facility], [address], [place], [place] [address]  CBC with Differential/Platelet     Status: None   Collection Time: 03/17/23 10:38 AM  Result Value Ref Range   WBC 4.3 4.0 - 10.5 K/uL   RBC 4.54 4.22 - 5.81 MIL/uL   Hemoglobin 13.3 13.0 - 17.0 g/dL   HCT 78.2 95.6 - 21.3 %   MCV 91.4 80.0 - 100.0 fL   MCH 29.3 26.0 - 34.0 pg   MCHC 32.0 30.0 - 36.0 g/dL   RDW 08.6 57.8 - 46.9 %   Platelets 159 150 - 400 K/uL   nRBC 0.0 0.0 - 0.2 %   Neutrophils Relative % 64 %   Neutro Abs 2.8 1.7 - 7.7 K/uL   Lymphocytes Relative 21 %   Lymphs Abs 0.9 0.7 - 4.0 K/uL    Monocytes Relative 7 %   Monocytes Absolute 0.3 0.1 - 1.0 K/uL   Eosinophils Relative 6 %   Eosinophils Absolute 0.3 0.0 - 0.5 K/uL   Basophils Relative 1 %   Basophils Absolute 0.0 0.0 - 0.1 K/uL   Immature Granulocytes 1 %   Abs Immature Granulocytes 0.02 0.00 - 0.07 K/uL    Comment: Performed at [facility], [address] [place], [place] [address]  Brain natriuretic peptide     Status: None  Collection Time: 03/17/23 10:38 AM  Result Value Ref Range   B Natriuretic Peptide 21.0 0.0 - 100.0 pg/mL    Comment: Performed at [facility], [address] [place], [place] [address]  Blood gas, venous     Status: Abnormal   Collection Time: 03/17/23 10:38 AM  Result Value Ref Range   pH, Ven 7.41 7.25 - 7.43   pCO2, Ven 49 44 - 60 mmHg   pO2, Ven <31 (LL) 32 - 45 mmHg    Comment: CRITICAL RESULT CALLED TO, READ BACK BY AND VERIFIED WITH: D FOWLER RN 1102 [phone] K FORSYTH    Bicarbonate 31.7 (H) 20.0 - 28.0 mmol/L   Acid-Base Excess 5.8 (H) 0.0 - 2.0 mmol/L   O2 Saturation 43.5 %   Patient temperature 36.7    Collection site RIGHT ANTECUBITAL    Drawn by [phone]     Comment: Performed at [facility], [address] [place], [place] [address]  Troponin I (High Sensitivity)     Status: None   Collection Time: 03/17/23 10:38 AM  Result Value Ref Range   Troponin I (High Sensitivity) 3 <18 ng/L    Comment: (NOTE) Elevated high sensitivity troponin I (hsTnI) values and significant  changes across serial measurements may suggest ACS but many other  chronic and acute conditions are known to elevate hsTnI results.  Refer to the "Links" section for chest pain algorithms and additional  guidance. Performed at [facility], [address] [place], [place] [address]   Resp panel by RT-PCR (RSV, Flu A&B, Covid) Anterior Nasal Swab     Status: None   Collection Time: 03/17/23 10:40 AM   Specimen: Anterior Nasal Swab  Result Value Ref Range   SARS Coronavirus 2 by RT PCR NEGATIVE  NEGATIVE    Comment: (NOTE) SARS-CoV-2 target nucleic acids are NOT DETECTED.  The SARS-CoV-2 RNA is generally detectable in upper respiratory specimens during the acute phase of infection. The lowest concentration of SARS-CoV-2 viral copies this assay can detect is 138 copies/mL. A negative result does not preclude SARS-Cov-2 infection and should not be used as the sole basis for treatment or other patient management decisions. A negative result may occur with  improper specimen collection/handling, submission of specimen other than nasopharyngeal swab, presence of viral mutation(s) within the areas targeted by this assay, and inadequate number of viral copies(<138 copies/mL). A negative result must be combined with clinical observations, patient history, and epidemiological information. The expected result is Negative.  Fact Sheet for Patients:  BloggerCourse.com  Fact Sheet for Healthcare Providers:  SeriousBroker.it  This test is no t yet approved or cleared by the [place] FDA and  has been authorized for detection and/or diagnosis of SARS-CoV-2 by FDA under an Emergency Use Authorization (EUA). This EUA will remain  in effect (meaning this test can be used) for the duration of the COVID-19 declaration under Section 564(b)(1) of the Act, 21 U.S.C.section 360bbb-3(b)(1), unless the authorization is terminated  or revoked sooner.       Influenza A by PCR NEGATIVE NEGATIVE   Influenza B by PCR NEGATIVE NEGATIVE    Comment: (NOTE) The Xpert Xpress SARS-CoV-2/FLU/RSV plus assay is intended as an aid in the diagnosis of influenza from Nasopharyngeal swab specimens and should not be used as a sole basis for treatment. Nasal washings and aspirates are unacceptable for Xpert Xpress SARS-CoV-2/FLU/RSV testing.  Fact Sheet for Patients: BloggerCourse.com  Fact Sheet for Healthcare  Providers: SeriousBroker.it  This test is not  yet approved or cleared by the [place] and has been authorized for detection and/or diagnosis of SARS-CoV-2 by FDA under an Emergency Use Authorization (EUA). This EUA will remain in effect (meaning this test can be used) for the duration of the COVID-19 declaration under Section 564(b)(1) of the Act, 21 U.S.C. section 360bbb-3(b)(1), unless the authorization is terminated or revoked.     Resp Syncytial Virus by PCR NEGATIVE NEGATIVE    Comment: (NOTE) Fact Sheet for Patients: BloggerCourse.com  Fact Sheet for Healthcare Providers: SeriousBroker.it  This test is not yet approved or cleared by the [place] FDA and has been authorized for detection and/or diagnosis of SARS-CoV-2 by FDA under an Emergency Use Authorization (EUA). This EUA will remain in effect (meaning this test can be used) for the duration of the COVID-19 declaration under Section 564(b)(1) of the Act, 21 U.S.C. section 360bbb-3(b)(1), unless the authorization is terminated or revoked.  Performed at [facility], [address], [place], [place] [address]   Troponin I (High Sensitivity)     Status: None   Collection Time: 03/17/23 12:25 PM  Result Value Ref Range   Troponin I (High Sensitivity) 3 <18 ng/L    Comment: (NOTE) Elevated high sensitivity troponin I (hsTnI) values and significant  changes across serial measurements may suggest ACS but many other  chronic and acute conditions are known to elevate hsTnI results.  Refer to the "Links" section for chest pain algorithms and additional  guidance. Performed at [facility], [address] [place], [place] [address]    CT Angio Chest PE W/Cm &/Or Wo Cm  Result Date: 03/17/2023 CLINICAL DATA:  Pulmonary embolism suspected, high probability. Shortness of breath over the last 4 days. EXAM: CT ANGIOGRAPHY CHEST WITH CONTRAST  TECHNIQUE: Multidetector CT imaging of the chest was performed using the standard protocol during bolus administration of intravenous contrast. Multiplanar CT image reconstructions and MIPs were obtained to evaluate the vascular anatomy. RADIATION DOSE REDUCTION: This exam was performed according to the departmental dose-optimization program which includes automated exposure control, adjustment of the mA and/or kV according to patient size and/or use of iterative reconstruction technique. CONTRAST:  75mL OMNIPAQUE IOHEXOL 350 MG/ML SOLN COMPARISON:  Chest radiography same day FINDINGS: Cardiovascular: Heart size upper limits of normal. No pericardial fluid. Extensive coronary artery calcification. Atherosclerotic disease of the aorta. Pulmonary arterial opacification is good. Study is positive for small emboli in the right lower lobe pulmonary arterial tree. No massive or submassive PE. Right ventricular to left ventricular ratio is 1.16, but this probably reflects chronic disease rather than being attributable to the single visible embolus in the right lower lobe. Mediastinum/Nodes: No mediastinal or hilar mass or lymphadenopathy. Lungs/Pleura: Advanced emphysema, diffuse but with upper lung predominance. No infiltrate, collapse or effusion. No mass or nodule in need of further follow-up. Upper Abdomen: Negative Musculoskeletal: Ordinary chronic thoracic degenerative changes. Multilevel bridging osteophytes suggesting diffuse idiopathic skeletal hyperostosis. Old minor loss of height at T5. Review of the MIP images confirms the above findings. IMPRESSION: 1. Study is positive for small emboli in the right lower lobe pulmonary arterial tree. No massive or submassive PE. 2. Advanced emphysema, diffuse but with upper lung predominance. 3. Extensive coronary artery calcification. 4. These results were called by telephone at the time of interpretation on 03/17/2023 at 12:54 pm to provider [facility] , who verbally  acknowledged these results. 1 Aortic Atherosclerosis (ICD10-I70.0) and Emphysema (ICD10-J43.9). Electronically Signed   By: Scherrie Bateman.D.  On: 03/17/2023 12:57   DG Chest Port 1 View  Result Date: 03/17/2023 CLINICAL DATA:  sob EXAM: PORTABLE CHEST 1 VIEW COMPARISON:  CXR 03/15/23 FINDINGS: No pleural effusion. No pneumothorax. No focal airspace opacity. Normal cardiac and mediastinal contours. No radiographically apparent displaced rib fractures. Visualized upper abdomen is unremarkable IMPRESSION: No focal airspace opacity. Electronically Signed   By: Lorenza Cambridge M.D.   On: 03/17/2023 11:03    Pending Labs Unresulted Labs (From admission, onward)    None       Vitals/Pain Today's Vitals   03/17/23 1345 03/17/23 1400 03/17/23 1409 03/17/23 1410  BP: 129/70 131/64    Pulse: 99 99 97   Resp: (!) 26 (!) 26 (!) 21   Temp:    97.9 F (36.6 C)  TempSrc:    Oral  SpO2: (!) 89% (!) 89% 95%   Weight:      Height:      PainSc:        Isolation Precautions No active isolations  Medications Medications  0.9 %  sodium chloride infusion ( Intravenous New Bag/Given 03/17/23 1058)  apixaban (ELIQUIS) tablet 10 mg (10 mg Oral Given 03/17/23 1406)    Followed by  apixaban (ELIQUIS) tablet 5 mg (has no administration in time range)  ipratropium-albuterol (DUONEB) 0.5-2.5 (3) MG/3ML nebulizer solution 3 mL (3 mLs Nebulization Given 03/17/23 1021)  magnesium sulfate IVPB 2 g 50 mL (0 g Intravenous Stopped 03/17/23 1156)  methylPREDNISolone sodium succinate (SOLU-MEDROL) 125 mg/2 mL injection 125 mg (125 mg Intravenous Given 03/17/23 1056)  albuterol (PROVENTIL) (2.5 MG/3ML) 0.083% nebulizer solution 2.5 mg (2.5 mg Nebulization Given 03/17/23 1025)  iohexol (OMNIPAQUE) 350 MG/ML injection 75 mL (75 mLs Intravenous Contrast Given 03/17/23 1249)    Mobility walks     Focused Assessments Pulmonary Assessment Handoff:  Lung sounds: Bilateral Breath Sounds: Expiratory wheezes O2 Device: Nasal  Cannula O2 Flow Rate (L/min): 3 L/min    R Recommendations: See Admitting Provider Note  Report given to:   Additional Notes: Pt was on room air and now I have put them on 3 lpm to keep oxygen above 90%.

## 2023-03-17 NOTE — ED Notes (Signed)
[place] of leg just completed.

## 2023-03-17 NOTE — Progress Notes (Signed)
  Echocardiogram 2D Echocardiogram has been performed.  Maren Reamer 03/17/2023, 4:21 PM

## 2023-03-17 NOTE — ED Notes (Signed)
Pt noted to be below 90 % on RA. Attempts for pt to do breathing techniques did not improve oxygen consistently. 2 lpm nasal cannula applied to keep oxygen above 90%.

## 2023-03-17 NOTE — H&P (Signed)
History and Physical    Patient: Jeremy [name]:[id number] DOB: January 03, 1936 DOA: 03/17/2023 DOS: the patient was seen and examined on 03/17/2023 PCP: Benita Stabile, MD  Patient coming from: Home  Chief Complaint:  Chief Complaint  Patient presents with   Shortness of Breath   HPI: Jeremy [name] is an 87 y.o. male with a history of tobacco use, COPD, CAD, HTN who presented to the ED with 3-4 days of progressively worsening dyspnea. He noted gradual shortness of breath with activities and at rest associated with wheezing improved with nebulized therapy at home. Also with cough productive of clear sputum. Saw his PCP and had an abnormal CXR with plans for CT, but when symptoms continued worsening, he couldn't sleep, hasn't been eating, he came to the ED. No chest pain, fevers, personal or FH of blood clots.   Here he is afebrile and tachycardic with mild hypoxemic < 90% on room air, tachypnea, normal WBC, BNP, troponin x2, negative viral panel, and CTA chest showing severe emphysema and small PE without definite right heart strain. He has continued to have wheezing and hypoxia, though respiratory effort improved with steroids, nebs, magnesium, so admission requested.  Review of Systems: As mentioned in the history of present illness. All other systems reviewed and are negative. Past Medical History:  Diagnosis Date   Abnormal CXR 4/20011   Chest  CT mode/serere COPD ( no mediastinal abnormality)   CAD (coronary artery disease) 2005   anterior MI with stent to the LAD in 1991 / stent 1996 / nuclear 2005, no ischemia   Carotid bruit    Doppler, June, 2013, 0-39% bilateral   COPD (chronic obstructive pulmonary disease) (HCC)    Diverticula of colon    Ejection fraction    55-60%, echo, April, 2011, could not estimate RV pressure   Esophagitis    HOH (hard of hearing)    Hyperlipidemia    Hypertension    Myocardial infarction (HCC)    x2   Shortness of breath    with exertion   Sleep  apnea    Stop Bang score of 5   Tobacco abuse    Past Surgical History:  Procedure Laterality Date   APPENDECTOMY     BIOPSY  11/17/2017   Procedure: BIOPSY;  Surgeon: Malissa Hippo, MD;  [facility];  Service: Endoscopy;;  gastric   CARDIAC CATHETERIZATION     CATARACT EXTRACTION Left    CATARACT EXTRACTION W/PHACO Right 01/22/2014   Procedure: CATARACT EXTRACTION PHACO AND INTRAOCULAR LENS PLACEMENT (IOC);  Surgeon: Gemma Payor, MD;  Location: [facility];  Service: Ophthalmology;  Laterality: Right;  CDE 7.46   CHOLECYSTECTOMY     8 years ago [facility]   COLONOSCOPY  10/05/2012   Dr. Karilyn Cota: multiple diverticula at sigmoid colon, multiple polyps (tubular adenomas), small external hemorrhoids   COLONOSCOPY N/A 11/18/2017   Procedure: COLONOSCOPY;  Surgeon: Malissa Hippo, MD;  [facility];  Service: Endoscopy;  Laterality: N/A;   COLONOSCOPY WITH PROPOFOL N/A 05/07/2021   Procedure: COLONOSCOPY WITH PROPOFOL;  Surgeon: Malissa Hippo, MD;  [facility];  Service: Endoscopy;  Laterality: N/A;   CYST REMOVAL HAND     right wrist   ESOPHAGEAL DILATION N/A 05/23/2015   Procedure: ESOPHAGEAL DILATION;  Surgeon: Corbin Ade, MD;  [facility];  Service: Endoscopy;  Laterality: N/A;   ESOPHAGEAL DILATION N/A 11/17/2017   Procedure: ESOPHAGEAL DILATION;  Surgeon: Malissa Hippo,  MD;  [facility];  Service: Endoscopy;  Laterality: N/A;   ESOPHAGEAL DILATION N/A 05/07/2021   Procedure: ESOPHAGEAL DILATION;  Surgeon: Malissa Hippo, MD;  [facility];  Service: Endoscopy;  Laterality: N/A;   ESOPHAGOGASTRODUODENOSCOPY N/A 05/23/2015   Dr. Jena Gauss: Ulcerative/ erosive reflux esophagitis with peptic stricture formation. Status post dilation as described. Hiatal hernia. Abnormal gastric mucosa of uncertain significance status post gastric biospy. +H.pylori   ESOPHAGOGASTRODUODENOSCOPY N/A 11/17/2017   Procedure: ESOPHAGOGASTRODUODENOSCOPY  (EGD);  Surgeon: Malissa Hippo, MD;  [facility];  Service: Endoscopy;  Laterality: N/A;   ESOPHAGOGASTRODUODENOSCOPY (EGD) WITH PROPOFOL N/A 05/07/2021   Procedure: ESOPHAGOGASTRODUODENOSCOPY (EGD) WITH PROPOFOL;  Surgeon: Malissa Hippo, MD;  [facility];  Service: Endoscopy;  Laterality: N/A;   PERCUTANEOUS STENT INTERVENTION     SKIN CANCER EXCISION  2015   ear & neck   TONSILLECTOMY     Social History:  reports that he quit smoking about 34 years ago. His smoking use included cigarettes. He started smoking about 39 years ago. He has a 36.00 pack-year smoking history. His smokeless tobacco use includes chew. He reports that he does not drink alcohol and does not use drugs.  No Known Allergies  Family History  Problem Relation Age of Onset   Heart disease Mother    Heart disease Father    Heart disease Brother    Stomach cancer Brother    Colon cancer Neg Hx     Prior to Admission medications   Medication Sig Start Date End Date Taking? Authorizing Provider  albuterol (PROVENTIL HFA;VENTOLIN HFA) 108 (90 Base) MCG/ACT inhaler Inhale 1-2 puffs into the lungs every 6 (six) hours as needed for wheezing or shortness of breath.    [provider]  amLODipine (NORVASC) 10 MG tablet Take 5 mg by mouth daily. 01/26/22   [provider]  aspirin 81 MG tablet Take 1 tablet (81 mg total) by mouth every evening. 05/09/21   Regalado, Belkys A, MD  atorvastatin (LIPITOR) 80 MG tablet Take 1 tablet (80 mg total) by mouth daily. 10/12/22   Antoine Poche, MD  budesonide (PULMICORT) 0.25 MG/2ML nebulizer solution One vial twice daily with formoterol 01/18/23   Nyoka Cowden, MD  Cholecalciferol (VITAMIN D3) 50 MCG (2000 UT) CHEW Chew 1 tablet by mouth daily.    [provider]  cyanocobalamin 1000 MCG tablet Take 1,000 mcg by mouth daily.    [provider]  diphenhydrAMINE (BENADRYL) 25 MG tablet Take 12.5 mg by mouth at bedtime as  needed for sleep.    [provider]  formoterol (PERFOROMIST) 20 MCG/2ML nebulizer solution One vial every 12 hours 01/18/23   Nyoka Cowden, MD  ipratropium-albuterol (DUONEB) 0.5-2.5 (3) MG/3ML SOLN Take 3 mLs by nebulization every 4 (four) hours as needed (Shortness of breath). 09/18/21   [provider]  levocetirizine (XYZAL) 5 MG tablet Take 5 mg by mouth every evening.    [provider]  losartan (COZAAR) 100 MG tablet Take 100 mg by mouth daily. 01/02/22   [provider]  Menthol, Topical Analgesic, (ICY HOT EX) Apply 1 application topically daily as needed (pain).    [provider]  nitroGLYCERIN (NITROSTAT) 0.4 MG SL tablet PLACE 1 TABLET UNDER THE TONGUE EVERY 5 MINUTES AS NEEDED FOR CHEST PAIN. MAX 3 TABLETS PER EVENT. CALL 911 12/24/22   Antoine Poche, MD  pantoprazole (PROTONIX) 40 MG tablet Take 1 tablet (40  mg total) by mouth 2 (two) times daily. 10/12/22   Antoine Poche, MD  predniSONE (DELTASONE) 10 MG tablet Take  4 each am x 2 days,   2 each am x 2 days,  1 each am x 2 days and stop 01/18/23   Nyoka Cowden, MD  revefenacin (YUPELRI) 175 MCG/3ML nebulizer solution Take 3 mLs (175 mcg total) by nebulization daily. 02/17/23   Nyoka Cowden, MD  tamsulosin (FLOMAX) 0.4 MG CAPS capsule Take 0.4 mg by mouth at bedtime. 07/17/22   [provider]  tiZANidine (ZANAFLEX) 2 MG tablet Take 1 tablet (2 mg total) by mouth every 6 (six) hours as needed for muscle spasms. 09/08/22   Pollyann Savoy, MD    Physical Exam: Vitals:   03/17/23 1345 03/17/23 1400 03/17/23 1409 03/17/23 1410  BP: 129/70 131/64    Pulse: 99 99 97   Resp: (!) 26 (!) 26 (!) 21   Temp:    97.9 F (36.6 C)  TempSrc:    Oral  SpO2: (!) 89% (!) 89% 95%   Weight:      Height:      Gen: Pleasant elderly male comfortably sitting in stretcher Pulm: Diminished with end inspiratory and expiratory wheezing  CV: RRR, no MRG or pitting edema GI: Soft, NT,  ND, +BS Neuro: Alert and oriented, HOH. No new focal deficits. Ext: Warm, no deformities. Skin: No rashes, lesions or ulcers on visualized skin   Data Reviewed: normal WBC, BNP, troponin x2, negative viral panel, and CTA chest showing severe emphysema and small PE without definite right heart strain. ECG personally reviewed showing RBBB which was not present March 2023.  Assessment and Plan: Acute hypoxic respiratory failure: Due primarily to AECOPD.  - Continue supplemental oxygen to maintain normal respiratory effort and SpO2 >89%.   AECOPD: Improved significantly, though still come wheezing.  - Continue IV steroids, scheduled and prn nebs, add azithromycin given severity, increased sputum.   Acute pulmonary embolism: Relatively low clot burden, hemodynamically stable with normal BNP, troponin. RV:LV 1.16 though with his pulmonary disease and current clinical stability, this is favored to reflect a chronic finding. Similar rationale for the new RBBB compared to early 2023.  - Started eliquis, will check echo and LE venous U/S.   HTN, HLD, CAD: No angina   - Took routine home meds today, will reorder as appropriate tomorrow morning.   BPH:  - Reorder home medication once confirmed.   Advance Care Planning: Confirmed with pt and spouse he desires to be DNR  Consults: None  Family Communication: Spouse at bedside  Severity of Illness: The appropriate patient status for this patient is OBSERVATION. Observation status is judged to be reasonable and necessary in order to provide the required intensity of service to ensure the patient's safety. The patient's presenting symptoms, physical exam findings, and initial radiographic and laboratory data in the context of their medical condition is felt to place them at decreased risk for further clinical deterioration. Furthermore, it is anticipated that the patient will be medically stable for discharge from the hospital within 2 midnights of  admission.   Author: Tyrone Nine, MD 03/17/2023 2:38 PM  For on call review [email or web address].

## 2023-03-17 NOTE — Discharge Instructions (Addendum)
Information on my medicine - ELIQUIS (apixaban)  This medication education was reviewed with me or my healthcare representative as part of my discharge preparation.    Why was Eliquis prescribed for you? Eliquis was prescribed to treat blood clots that may have been found in the veins of your legs (deep vein thrombosis) or in your lungs (pulmonary embolism) and to reduce the risk of them occurring again.  What do You need to know about Eliquis ? The starting dose is 10 mg (two 5 mg tablets) taken TWICE daily for the FIRST SEVEN (7) DAYS, then on 03/24/2023  the dose is reduced to ONE 5 mg tablet taken TWICE daily.  Eliquis may be taken with or without food.   Try to take the dose about the same time in the morning and in the evening. If you have difficulty swallowing the tablet whole please discuss with your pharmacist how to take the medication safely.  Take Eliquis exactly as prescribed and DO NOT stop taking Eliquis without talking to the doctor who prescribed the medication.  Stopping may increase your risk of developing a new blood clot.  Refill your prescription before you run out.  After discharge, you should have regular check-up appointments with your healthcare provider that is prescribing your Eliquis.    What do you do if you miss a dose? If a dose of ELIQUIS is not taken at the scheduled time, take it as soon as possible on the same day and twice-daily administration should be resumed. The dose should not be doubled to make up for a missed dose.  Important Safety Information A possible side effect of Eliquis is bleeding. You should call your healthcare provider right away if you experience any of the following: Bleeding from an injury or your nose that does not stop. Unusual colored urine (red or dark brown) or unusual colored stools (red or black). Unusual bruising for unknown reasons. A serious fall or if you hit your head (even if there is no bleeding).  Some  medicines may interact with Eliquis and might increase your risk of bleeding or clotting while on Eliquis. To help avoid this, consult your healthcare provider or pharmacist prior to using any new prescription or non-prescription medications, including herbals, vitamins, non-steroidal anti-inflammatory drugs (NSAIDs) and supplements.  This website has more information on Eliquis (apixaban): http://[email or web address].com/[email or web address]

## 2023-03-17 NOTE — Progress Notes (Signed)
ANTICOAGULATION CONSULT NOTE - Initial Consult  Pharmacy Consult for apixaban Indication: pulmonary embolus  No Known Allergies  Patient Measurements: Height: 6' (182.9 cm) Weight: 84.9 kg (187 lb 2.7 oz) IBW/kg (Calculated) : 77.6 Heparin Dosing Weight:   Vital Signs: Temp: 98 F (36.7 C) (07/03 1011) Temp Source: Oral (07/03 1011) BP: 137/73 (07/03 1315) Pulse Rate: 100 (07/03 1315)  Labs: Recent Labs    03/17/23 1038 03/17/23 1225  HGB 13.3  --   HCT 41.5  --   PLT 159  --   CREATININE 1.25*  --   TROPONINIHS 3 3    Estimated Creatinine Clearance: 47.4 mL/min (A) (by C-G formula based on SCr of 1.25 mg/dL (H)).   Medical History: Past Medical History:  Diagnosis Date   Abnormal CXR 4/20011   Chest  CT mode/serere COPD ( no mediastinal abnormality)   CAD (coronary artery disease) 2005   anterior MI with stent to the LAD in 1991 / stent 1996 / nuclear 2005, no ischemia   Carotid bruit    Doppler, June, 2013, 0-39% bilateral   COPD (chronic obstructive pulmonary disease) (HCC)    Diverticula of colon    Ejection fraction    55-60%, echo, April, 2011, could not estimate RV pressure   Esophagitis    HOH (hard of hearing)    Hyperlipidemia    Hypertension    Myocardial infarction (HCC)    x2   Shortness of breath    with exertion   Sleep apnea    Stop Bang score of 5   Tobacco abuse     Medications:  (Not in a hospital admission)   Assessment: Pharmacy consulted to dose apixaban in patient with pulmonary embolism- confirmed by CT. Patient is not on anticoagulation prior to admission.  CBC WNL Goal of Therapy:   Monitor platelets by anticoagulation protocol: Yes   Plan:  Apixaban 10 mg twice daily x 7 days followed by apixaban 5 mg twice daily. Monitor H&H and s/s of bleeding.  Jeremy [name], PharmD Clinical Pharmacist 03/17/2023 2:07 PM

## 2023-03-17 NOTE — ED Notes (Signed)
Patient transported to CT 

## 2023-03-18 DIAGNOSIS — J441 Chronic obstructive pulmonary disease with (acute) exacerbation: Secondary | ICD-10-CM | POA: Diagnosis present

## 2023-03-18 DIAGNOSIS — Z720 Tobacco use: Secondary | ICD-10-CM | POA: Diagnosis not present

## 2023-03-18 DIAGNOSIS — I82451 Acute embolism and thrombosis of right peroneal vein: Secondary | ICD-10-CM | POA: Diagnosis present

## 2023-03-18 DIAGNOSIS — J439 Emphysema, unspecified: Secondary | ICD-10-CM | POA: Diagnosis present

## 2023-03-18 DIAGNOSIS — Z800 Family history of malignant neoplasm of digestive organs: Secondary | ICD-10-CM | POA: Diagnosis not present

## 2023-03-18 DIAGNOSIS — Z1152 Encounter for screening for COVID-19: Secondary | ICD-10-CM | POA: Diagnosis not present

## 2023-03-18 DIAGNOSIS — I4510 Unspecified right bundle-branch block: Secondary | ICD-10-CM | POA: Diagnosis present

## 2023-03-18 DIAGNOSIS — N400 Enlarged prostate without lower urinary tract symptoms: Secondary | ICD-10-CM | POA: Diagnosis present

## 2023-03-18 DIAGNOSIS — J9601 Acute respiratory failure with hypoxia: Secondary | ICD-10-CM | POA: Diagnosis present

## 2023-03-18 DIAGNOSIS — J9801 Acute bronchospasm: Secondary | ICD-10-CM | POA: Diagnosis present

## 2023-03-18 DIAGNOSIS — Z66 Do not resuscitate: Secondary | ICD-10-CM | POA: Diagnosis present

## 2023-03-18 DIAGNOSIS — I10 Essential (primary) hypertension: Secondary | ICD-10-CM | POA: Diagnosis present

## 2023-03-18 DIAGNOSIS — E785 Hyperlipidemia, unspecified: Secondary | ICD-10-CM | POA: Diagnosis present

## 2023-03-18 DIAGNOSIS — I2699 Other pulmonary embolism without acute cor pulmonale: Secondary | ICD-10-CM | POA: Diagnosis present

## 2023-03-18 DIAGNOSIS — Z79899 Other long term (current) drug therapy: Secondary | ICD-10-CM | POA: Diagnosis not present

## 2023-03-18 DIAGNOSIS — I252 Old myocardial infarction: Secondary | ICD-10-CM | POA: Diagnosis not present

## 2023-03-18 DIAGNOSIS — Z7951 Long term (current) use of inhaled steroids: Secondary | ICD-10-CM | POA: Diagnosis not present

## 2023-03-18 DIAGNOSIS — I2510 Atherosclerotic heart disease of native coronary artery without angina pectoris: Secondary | ICD-10-CM | POA: Diagnosis present

## 2023-03-18 DIAGNOSIS — Z85828 Personal history of other malignant neoplasm of skin: Secondary | ICD-10-CM | POA: Diagnosis not present

## 2023-03-18 DIAGNOSIS — Z7982 Long term (current) use of aspirin: Secondary | ICD-10-CM | POA: Diagnosis not present

## 2023-03-18 DIAGNOSIS — K219 Gastro-esophageal reflux disease without esophagitis: Secondary | ICD-10-CM | POA: Diagnosis present

## 2023-03-18 DIAGNOSIS — Z8249 Family history of ischemic heart disease and other diseases of the circulatory system: Secondary | ICD-10-CM | POA: Diagnosis not present

## 2023-03-18 MED ORDER — BENZONATATE 100 MG PO CAPS: 100.0000 mg | ORAL_CAPSULE | Freq: Three times a day (TID) | ORAL | Status: DC | PRN

## 2023-03-18 MED ORDER — LOSARTAN POTASSIUM 50 MG PO TABS
100.0000 mg | ORAL_TABLET | Freq: Every day | ORAL | Status: DC
  Administered 2023-03-18 – 2023-03-19 (×2): 100 mg via ORAL
  Filled 2023-03-18 (×2): qty 2

## 2023-03-18 MED ORDER — PANTOPRAZOLE SODIUM 40 MG PO TBEC
40.0000 mg | DELAYED_RELEASE_TABLET | Freq: Two times a day (BID) | ORAL | Status: DC
  Administered 2023-03-18 – 2023-03-19 (×2): 40 mg via ORAL
  Filled 2023-03-18 (×2): qty 1

## 2023-03-18 MED ORDER — LORATADINE 10 MG PO TABS
10.0000 mg | ORAL_TABLET | Freq: Every evening | ORAL | Status: DC
  Administered 2023-03-18: 10 mg via ORAL
  Filled 2023-03-18: qty 1

## 2023-03-18 MED ORDER — ASPIRIN 81 MG PO CHEW
81.0000 mg | CHEWABLE_TABLET | Freq: Every evening | ORAL | Status: DC
  Administered 2023-03-18: 81 mg via ORAL
  Filled 2023-03-18: qty 1

## 2023-03-18 MED ORDER — ATORVASTATIN CALCIUM 40 MG PO TABS
80.0000 mg | ORAL_TABLET | Freq: Every day | ORAL | Status: DC
  Administered 2023-03-18 – 2023-03-19 (×2): 80 mg via ORAL
  Filled 2023-03-18 (×2): qty 2

## 2023-03-18 NOTE — Care Management Obs Status (Signed)
MEDICARE OBSERVATION STATUS NOTIFICATION   Patient Details  Name: Jeremy [name] MRN: [id number] Date of Birth: December 03, 1935   Medicare Observation Status Notification Given:  Yes    Juliza Machnik Feliz Beam 03/18/2023, 11:08 AM

## 2023-03-18 NOTE — Progress Notes (Signed)
[facility] PROGRESS NOTE  Jeremy [name] (DOB: 08-27-1936) ONG:[id number] PCP: Benita Stabile, MD  Brief Narrative: Jeremy [name] is an 87 y.o. male with a history of tobacco use, COPD, CAD, HTN  who presented to the ED on 03/17/2023 with dyspnea and wheezing, subsequently admitted for acute hypoxic respiratory failure due to AECOPD and small PE.   Subjective: Feels breathing is much better, continues to get great benefit from nebs. Slept fine. Eating ok. Got up to bathroom without oxygen and sats dropped and he was significant dyspneic.  Objective: BP 137/62 (BP Location: Left Arm)   Pulse 96   Temp 98.9 F (37.2 C) (Oral)   Resp 18   Ht 6' (1.829 m)   Wt 85.7 kg   SpO2 96%   BMI 25.62 kg/m   Gen: Elderly male Pulm: End-expiratory wheezing in all fields, no crackles  CV: RRR, no MRG or edema GI: Soft, NT, ND, +BS Neuro: Alert and oriented. No new focal deficits. Ext: Warm, no deformities. Skin: No rashes, lesions or ulcers on visualized skin   Assessment & Plan: Acute hypoxic respiratory failure: Due primarily to AECOPD, still wheezing and still hypoxemic.  - Continue supplemental oxygen to maintain normal respiratory effort and SpO2 >89%.    AECOPD: Improved significantly, though still come wheezing.  - Continue IV steroids - Continue scheduled and prn nebs - Complete 5 days azithromycin given severity, increased sputum.    Acute RLE peroneal vein DVT and PE: : Relatively low clot burden, hemodynamically stable with normal BNP, troponin. RV:LV 1.16 though with his pulmonary disease and current clinical stability, this is favored to reflect a chronic finding. Similar rationale for the new RBBB compared to early 2023.  - Started eliquis - Echo extremely limited visualization though LVEF ok. RV not well visualized "mildly enlarged with normal function."   HTN, HLD, CAD: No angina   - Continue home ASA, statin, losartan  GERD:  - Continue PPI  Tyrone Nine, MD [facility] [email or web address] 03/18/2023, 3:31 PM

## 2023-03-18 NOTE — Progress Notes (Signed)
Patient rested through the night, presenting with SOB, respiratory called and breathing treatment given, patient stated breathing treatment helped.

## 2023-03-18 NOTE — TOC CM/SW Note (Signed)
Transition of Care [facility]) - Inpatient Brief Assessment   Patient Details  Name: Jeremy [name] MRN: [id number] Date of Birth: 09-20-1935  Transition of Care [facility]) CM/SW Contact:    Elliot Gault, LCSW Phone Number: 03/18/2023, 1:21 PM   Clinical Narrative:  Transition of Care Department [facility]) has reviewed patient and no TOC needs have been identified at this time. We will continue to monitor patient advancement through interdisciplinary progression rounds. If new patient transition needs arise, please place a TOC consult.  Transition of Care Asessment: Insurance and Status: Insurance coverage has been reviewed Patient has primary care physician: Yes Home environment has been reviewed: from home with wife Prior level of function:: independent Prior/Current Home Services: No current home services Social Determinants of Health Reivew: SDOH reviewed no interventions necessary Readmission risk has been reviewed: Yes Transition of care needs: no transition of care needs at this time

## 2023-03-19 ENCOUNTER — Telehealth (HOSPITAL_COMMUNITY): Payer: Self-pay

## 2023-03-19 ENCOUNTER — Other Ambulatory Visit (HOSPITAL_COMMUNITY): Payer: Self-pay

## 2023-03-19 DIAGNOSIS — J441 Chronic obstructive pulmonary disease with (acute) exacerbation: Secondary | ICD-10-CM | POA: Diagnosis not present

## 2023-03-19 MED ORDER — AZITHROMYCIN 250 MG PO TABS: 250.0000 mg | ORAL_TABLET | Freq: Every day | ORAL | 0 refills | Status: AC

## 2023-03-19 MED ORDER — APIXABAN 5 MG PO TABS: ORAL_TABLET | ORAL | 1 refills | Status: DC

## 2023-03-19 MED ORDER — PREDNISONE 20 MG PO TABS: ORAL_TABLET | ORAL | 0 refills | Status: AC

## 2023-03-19 NOTE — Progress Notes (Signed)
SATURATION QUALIFICATIONS: (This note is used to comply with regulatory documentation for home oxygen)  Patient Saturations on Room Air at Rest = 92%  Patient Saturations on Room Air while Ambulating = 86%  Patient Saturations on 3 Liters of oxygen while Ambulating = 90%  Please briefly explain why patient needs home oxygen: Pt oxygen saturation decreases to 86% room air while ambulating. Pt O2 sat = 90% on 3L [place] during ambulation.

## 2023-03-19 NOTE — TOC Initial Note (Signed)
Transition of Care [facility]) - Initial/Assessment Note    Patient Details  Name: Jeremy [name] MRN: [id number] Date of Birth: September 05, 1936  Transition of Care [facility]) CM/SW Contact:    Karn Cassis, LCSW Phone Number: 03/19/2023, 1:46 PM  Clinical Narrative:  Pt admitted due to acute hypoxic respiratory failure. Pt will need home O2. Discussed with pt's wife who requests [employer]. Referral made to [facility] with [employer]. Orders and sat qualification note in. Portable O2 to be delivered to room this afternoon. No other needs reported.                  Expected Discharge Plan: Home w Home Health Services Barriers to Discharge: Barriers Resolved   Patient Goals and CMS Choice Patient states their goals for this hospitalization and ongoing recovery are:: return home   Choice offered to / list presented to : Spouse Virgil ownership interest in [facility].provided to::  (n/a)    Expected Discharge Plan and Services In-house Referral: Clinical Social Work     Living arrangements for the past 2 months: Single Family Home Expected Discharge Date: 03/19/23               DME Arranged: Oxygen DME Agency: [employer] Date DME Agency Contacted: 03/19/23 Time DME Agency Contacted: [phone] Representative spoke with at DME Agency: Mitch            Prior Living Arrangements/Services Living arrangements for the past 2 months: Single Family Home Lives with:: Spouse Patient language and need for interpreter reviewed:: Yes Do you feel safe going back to the place where you live?: Yes      Need for Family Participation in Patient Care: No (Comment)   Current home services: DME (cane, walker) Criminal Activity/Legal Involvement Pertinent to Current Situation/Hospitalization: No - Comment as needed  Activities of Daily Living Home Assistive Devices/Equipment: Nebulizer ADL Screening (condition at time of admission) Patient's cognitive ability adequate to safely complete daily  activities?: Yes Is the patient deaf or have difficulty hearing?: Yes Does the patient have difficulty seeing, even when wearing glasses/contacts?: Yes Does the patient have difficulty concentrating, remembering, or making decisions?: No Patient able to express need for assistance with ADLs?: Yes Does the patient have difficulty dressing or bathing?: No Independently performs ADLs?: Yes (appropriate for developmental age) Does the patient have difficulty walking or climbing stairs?: No Weakness of Legs: None Weakness of Arms/Hands: None  Permission Sought/Granted                  Emotional Assessment     Affect (typically observed): Appropriate Orientation: : Oriented to Self, Oriented to Place, Oriented to  Time, Oriented to Situation Alcohol / Substance Use: Not Applicable Psych Involvement: No (comment)  Admission diagnosis:  COPD exacerbation (HCC) [J44.1] Acute respiratory failure with hypoxia (HCC) [J96.01] Patient Active Problem List   Diagnosis Date Noted   Acute respiratory failure with hypoxia (HCC) 03/18/2023   COPD exacerbation (HCC) 11/22/2021   COVID-19 virus infection 04/18/2019   COVID-19 04/17/2019   Special screening for malignant neoplasms, colon 09/21/2018   Diverticulosis of large intestine with hemorrhage    GI bleed due to NSAIDs 11/17/2017   Rectal bleed 11/16/2017   GI bleed 11/16/2017   Hypokalemia 11/16/2017   Chest pain 05/02/2016   Decreased cardiac output 12/17/2015   Sensorineural hearing loss (SNHL), bilateral 12/17/2015   Tinnitus of left ear 12/17/2015   Tobacco dependence syndrome 12/17/2015   H. pylori infection 08/15/2015  Mucosal abnormality of stomach    Reflux esophagitis    Peptic stricture of esophagus    Rectal bleeding 05/21/2015   Dyspepsia 05/21/2015   Dysphagia 05/21/2015   Sinus bradycardia 03/19/2014   Carotid artery disease (HCC) 03/15/2014   Essential hypertension    Hyperlipidemia    CAD (coronary artery  disease)    Abnormal CXR    Ejection fraction    Tobacco abuse    COPD GOLD 2     PCP:  Benita Stabile, MD Pharmacy:   [facility] [phone] - [place], [place] - [address] [place] [address] Phone: [phone] Fax: [phone]     Social Determinants of Health (SDOH) Social History: SDOH Screenings   Food Insecurity: No Food Insecurity (03/17/2023)  Housing: Low Risk  (03/17/2023)  Transportation Needs: No Transportation Needs (03/17/2023)  Utilities: Not At Risk (03/17/2023)  Tobacco Use: High Risk (03/17/2023)   SDOH Interventions:     Readmission Risk Interventions     No data to display

## 2023-03-19 NOTE — Telephone Encounter (Signed)
Pharmacy Patient Advocate Encounter  Received notification from [facility] that Prior Authorization for Eliquis Starter Pack has been DENIED because PT has not tried/failed [place] which is preferred..  PA #/Case ID/Reference #: [id number]

## 2023-03-19 NOTE — Progress Notes (Signed)
Pt ambulated approx 100 ft. O2 sat = 86% room air. Pt O2 sat recovered to 92% room air after about 1 minute of sitting afterwards.

## 2023-03-19 NOTE — Discharge Summary (Signed)
Physician Discharge Summary   Patient: Jeremy [name] MRN: [id number] DOB: May 20, 1936  Admit date:     03/17/2023  Discharge date: 03/19/23  Discharge Physician: Tyrone Nine   PCP: Benita Stabile, MD   Recommendations at discharge:  Follow up with PCP in 1-2 weeks. Started on a planned 3-6 months of eliquis for small DVT and PE.  Follow up with pulmonology after discharge. Note discharged on supplemental oxygen after Tx for AECOPD.  Discharge Diagnoses: Principal Problem:   COPD exacerbation (HCC) Active Problems:   Essential hypertension   Hyperlipidemia   CAD (coronary artery disease)   Tobacco abuse   Acute respiratory failure with hypoxia [facility])  Hospital Course: Jeremy [name] is an 87 y.o. male with a history of tobacco use, COPD, CAD, HTN  who presented to the ED on 03/17/2023 with dyspnea and wheezing, subsequently admitted for acute hypoxic respiratory failure due to AECOPD and small PE.   Bronchospasm has clinically resolved and he is symptomatically much better. He is able to get around well with minimal dyspnea, but qualifies for exertional home oxygen which is arranged at discharge.   Assessment and Plan: Acute hypoxic respiratory failure: Due primarily to AECOPD, still wheezing and still hypoxemic.  - Continue supplemental oxygen on exertion at home. The patient was not previously on O2 but is now symptomatically much improved with resolution of bronchospasm and stable to be discharged home. Will recommend repeat ambulatory pulse oximetry at follow up.   AECOPD:  - Will Rx steroid taper - Continue scheduled and prn Tx at home - Complete 5 days azithromycin given severity, increased sputum.    Acute RLE peroneal vein DVT and PE: Relatively low clot burden, hemodynamically stable with normal BNP, troponin. RV:LV 1.16 though with his pulmonary disease and current clinical stability, this is favored to reflect a chronic finding. Similar rationale for the new RBBB compared to  early 2023.  - Started eliquis. - Echo extremely limited visualization though LVEF ok. RV not well visualized "mildly enlarged with normal function."   HTN, HLD, CAD: No angina   - Continue home ASA with  cardiac history and no bleeding since starting DOAC.  - Continue statin, losartan   GERD:  - Continue PPI  Consultants: None Procedures performed: None  Disposition: Home Diet recommendation:  Cardiac diet DISCHARGE MEDICATION: Allergies as of 03/19/2023   No Known Allergies      Medication List     TAKE these medications    albuterol 108 (90 Base) MCG/ACT inhaler Commonly known as: VENTOLIN HFA Inhale 1-2 puffs into the lungs every 6 (six) hours as needed for wheezing or shortness of breath.   apixaban 5 MG Tabs tablet Commonly known as: Eliquis Take 2 tablets (10mg ) twice daily for 7 days, then 1 tablet (5mg ) twice daily   aspirin 81 MG tablet Take 1 tablet (81 mg total) by mouth every evening.   atorvastatin 80 MG tablet Commonly known as: LIPITOR Take 1 tablet (80 mg total) by mouth daily.   azithromycin 250 MG tablet Commonly known as: ZITHROMAX Take 1 tablet (250 mg total) by mouth daily for 2 days. Start taking on: March 20, 2023   benzonatate 100 MG capsule Commonly known as: TESSALON Take 100 mg by mouth 3 (three) times daily as needed.   formoterol 20 MCG/2ML nebulizer solution Commonly known as: PERFOROMIST One vial every 12 hours   ipratropium-albuterol 0.5-2.5 (3) MG/3ML Soln Commonly known as: DUONEB Take 3 mLs by nebulization every  4 (four) hours as needed (Shortness of breath).   levocetirizine 5 MG tablet Commonly known as: XYZAL Take 5 mg by mouth every evening.   losartan 100 MG tablet Commonly known as: COZAAR Take 100 mg by mouth daily.   nitroGLYCERIN 0.4 MG SL tablet Commonly known as: NITROSTAT PLACE 1 TABLET UNDER THE TONGUE EVERY 5 MINUTES AS NEEDED FOR CHEST PAIN. MAX 3 TABLETS PER EVENT. CALL 911   pantoprazole 40 MG  tablet Commonly known as: PROTONIX Take 1 tablet (40 mg total) by mouth 2 (two) times daily.   predniSONE 20 MG tablet Commonly known as: DELTASONE Take 2 tablets (40 mg total) by mouth daily with breakfast for 2 days, THEN 1 tablet (20 mg total) daily with breakfast for 2 days, THEN 0.5 tablets (10 mg total) daily with breakfast for 2 days. Start taking on: March 19, 2023   Vitamin D3 50 MCG (2000 UT) [name] 1 tablet by mouth daily.   Yupelri 175 MCG/3ML nebulizer solution Generic drug: revefenacin Take 3 mLs (175 mcg total) by nebulization daily.               Durable Medical Equipment  (From admission, onward)           Start     Ordered   03/19/23 1157  For home use only DME oxygen  Once       Question Answer Comment  Length of Need 6 Months   Mode or (Route) Nasal cannula   Liters per Minute 2   Frequency Continuous (stationary and portable oxygen unit needed)   Oxygen delivery system Gas      03/19/23 1156            Follow-up Information     Benita Stabile, MD Follow up.   Specialty: Internal Medicine Contact information: [address] [place] [address] [phone]                Discharge Exam: Filed Weights   03/17/23 1026 03/17/23 1621  Weight: 84.9 kg 85.7 kg  BP (!) 153/82 (BP Location: Right Arm)   Pulse 88   Temp 98.2 F (36.8 C) (Oral)   Resp 18   Ht 6' (1.829 m)   Wt 85.7 kg   SpO2 94%   BMI 25.62 kg/m   Elderly very pleasant male in no distress Clear and nonlabored with normal effort, speaking full sentences RRR, no MRG or edema  Condition at discharge: stable  The results of significant diagnostics from this hospitalization (including imaging, microbiology, ancillary and laboratory) are listed below for reference.   Imaging Studies: ECHOCARDIOGRAM COMPLETE  Result Date: 03/17/2023    ECHOCARDIOGRAM REPORT   Patient Name:   Jeremy [name] [facility] Date of Exam: 03/17/2023 Medical Rec #:  [id number]      Height:        72.0 in Accession #:    [id number]     Weight:       187.2 lb Date of Birth:  [birth year]      BSA:          2.071 m Patient Age:    87 years       BP:           137/73 mmHg Patient Gender: M              HR:           98 bpm. [facility] Procedure: 2D Echo, Cardiac  Doppler and Color Doppler Indications:    Pulmonary Embolus I26.09  History:        Patient has prior history of Echocardiogram examinations, most                 recent 04/01/2022. Risk Factors:Hypertension, Dyslipidemia and                 Former Smoker.  Sonographer:    Aron Baba Referring Phys: [id number] Tyrone Nine  Sonographer Comments: Image acquisition challenging due to respiratory motion and Image acquisition challenging due to patient body habitus. IMPRESSIONS  1. Left ventricular ejection fraction, by estimation, is 65 to 70%. The left ventricle has normal function. Left ventricular endocardial border not optimally defined to evaluate regional wall motion. There is mild left ventricular hypertrophy. Left ventricular diastolic parameters are indeterminate.  2. RV not well visualized. Grossly appears mildly enlarged with normal function. . Right ventricular systolic function was not well visualized. The right ventricular size is not well visualized. Tricuspid regurgitation signal is inadequate for assessing  PA pressure.  3. The mitral valve was not well visualized. Trivial mitral valve regurgitation. No evidence of mitral stenosis.  4. The aortic valve was not well visualized. Aortic valve regurgitation is not visualized. No aortic stenosis is present. FINDINGS  Left Ventricle: Left ventricular ejection fraction, by estimation, is 65 to 70%. The left ventricle has normal function. Left ventricular endocardial border not optimally defined to evaluate regional wall motion. The left ventricular internal cavity size was normal in size. There is mild left ventricular hypertrophy. Left ventricular diastolic parameters are  indeterminate. Right Ventricle: RV not well visualized. Grossly appears mildly enlarged with normal function. The right ventricular size is not well visualized. Right vetricular wall thickness was not well visualized. Right ventricular systolic function was not well visualized. Tricuspid regurgitation signal is inadequate for assessing PA pressure. Left Atrium: Left atrial size was normal in size. Right Atrium: Right atrial size was not well visualized. Pericardium: The pericardium was not well visualized. Mitral Valve: The mitral valve was not well visualized. Trivial mitral valve regurgitation. No evidence of mitral valve stenosis. Tricuspid Valve: The tricuspid valve is not well visualized. Tricuspid valve regurgitation is not demonstrated. No evidence of tricuspid stenosis. Aortic Valve: The aortic valve was not well visualized. Aortic valve regurgitation is not visualized. No aortic stenosis is present. Aortic valve mean gradient measures 7.6 mmHg. Aortic valve peak gradient measures 16.4 mmHg. Aortic valve area, by VTI measures 1.98 cm. Pulmonic Valve: The pulmonic valve was not well visualized. Pulmonic valve regurgitation is not visualized. No evidence of pulmonic stenosis. Aorta: The aortic root and ascending aorta are structurally normal, with no evidence of dilitation. Venous: The inferior vena cava was not well visualized. IAS/Shunts: The interatrial septum was not well visualized.  LEFT VENTRICLE PLAX 2D LVIDd:         4.90 cm   Diastology LVIDs:         3.40 cm   LV e' medial:    4.35 cm/s LV PW:         1.20 cm   LV E/e' medial:  37.7 LV IVS:        1.10 cm   LV e' lateral:   10.00 cm/s LVOT diam:     2.20 cm   LV E/e' lateral: 16.4 LV SV:         65 LV SV Index:   32 LVOT Area:     3.80 cm  RIGHT  VENTRICLE RV S prime:     15.10 cm/s TAPSE (M-mode): 1.6 cm LEFT ATRIUM           Index        RIGHT ATRIUM           Index LA diam:      4.20 cm 2.03 cm/m   RA Area:     21.50 cm LA Vol (A2C): 38.5 ml  18.59 ml/m  RA Volume:   65.70 ml  31.72 ml/m LA Vol (A4C): 34.3 ml 16.56 ml/m  AORTIC VALVE AV Area (Vmax):    1.87 cm AV Area (Vmean):   1.97 cm AV Area (VTI):     1.98 cm AV Vmax:           202.25 cm/s AV Vmean:          127.330 cm/s AV VTI:            0.330 m AV Peak Grad:      16.4 mmHg AV Mean Grad:      7.6 mmHg LVOT Vmax:         99.40 cm/s LVOT Vmean:        66.000 cm/s LVOT VTI:          0.172 m LVOT/AV VTI ratio: 0.52  AORTA Ao Root diam: 3.70 cm Ao Asc diam:  3.20 cm MITRAL VALVE MV Area (PHT): 5.42 cm     SHUNTS MV Decel Time: 140 msec     Systemic VTI:  0.17 m MV E velocity: 164.00 cm/s  Systemic Diam: 2.20 cm MV A velocity: 47.40 cm/s MV E/A ratio:  3.46 Dina Rich MD Electronically signed by Dina Rich MD Signature Date/Time: 03/17/2023/4:25:45 PM    Final    US Venous Img Lower Bilateral (DVT)  Result Date: 03/17/2023 CLINICAL DATA:  Pulmonary embolism EXAM: BILATERAL LOWER EXTREMITY VENOUS DOPPLER ULTRASOUND TECHNIQUE: Gray-scale sonography with graded compression, as well as color Doppler and duplex ultrasound were performed to evaluate the lower extremity deep venous systems from the level of the common femoral vein and including the common femoral, femoral, profunda femoral, popliteal and calf veins including the posterior tibial, peroneal and gastrocnemius veins when visible. The superficial great saphenous vein was also interrogated. Spectral Doppler was utilized to evaluate flow at rest and with distal augmentation maneuvers in the common femoral, femoral and popliteal veins. COMPARISON:  No prior right lower extremity ultrasound, 07/31/2014 left lower extremity ultrasound FINDINGS: RIGHT LOWER EXTREMITY Common Femoral Vein: No evidence of thrombus. Normal compressibility, respiratory phasicity and response to augmentation. Saphenofemoral Junction: No evidence of thrombus. Normal compressibility and flow on color Doppler imaging. Profunda Femoral Vein: No evidence of  thrombus. Normal compressibility and flow on color Doppler imaging. Femoral Vein: No evidence of thrombus. Normal compressibility, respiratory phasicity and response to augmentation. Popliteal Vein: No evidence of thrombus. Normal compressibility, respiratory phasicity and response to augmentation. Calf Veins: Occlusive thrombus in one of the right peroneal veins. The right posterior tibial vein and anterior tibial vein are patent, without evidence of thrombus. Superficial Great Saphenous Vein: No evidence of thrombus. Normal compressibility. Venous Reflux:  None. Other Findings:  None. LEFT LOWER EXTREMITY Common Femoral Vein: No evidence of thrombus. Normal compressibility, respiratory phasicity and response to augmentation. Saphenofemoral Junction: No evidence of thrombus. Normal compressibility and flow on color Doppler imaging. Profunda Femoral Vein: No evidence of thrombus. Normal compressibility and flow on color Doppler imaging. Femoral Vein: No evidence of thrombus. Normal compressibility, respiratory phasicity and response  to augmentation. Popliteal Vein: No evidence of thrombus. Normal compressibility, respiratory phasicity and response to augmentation. Calf Veins: No evidence of thrombus. Normal compressibility and flow on color Doppler imaging. Superficial Great Saphenous Vein: No evidence of thrombus. Normal compressibility. Venous Reflux:  None. Other Findings:  None. IMPRESSION: 1. Occlusive thrombus in one of the right peroneal veins. 2. No evidence of deep venous thrombosis in the left lower extremity. Electronically Signed   By: Wiliam Ke M.D.   On: 03/17/2023 15:39   CT Angio Chest PE W/Cm &/Or Wo Cm  Result Date: 03/17/2023 CLINICAL DATA:  Pulmonary embolism suspected, high probability. Shortness of breath over the last 4 days. EXAM: CT ANGIOGRAPHY CHEST WITH CONTRAST TECHNIQUE: Multidetector CT imaging of the chest was performed using the standard protocol during bolus administration of  intravenous contrast. Multiplanar CT image reconstructions and MIPs were obtained to evaluate the vascular anatomy. RADIATION DOSE REDUCTION: This exam was performed according to the departmental dose-optimization program which includes automated exposure control, adjustment of the mA and/or kV according to patient size and/or use of iterative reconstruction technique. CONTRAST:  75mL OMNIPAQUE IOHEXOL 350 MG/ML SOLN COMPARISON:  Chest radiography same day FINDINGS: Cardiovascular: Heart size upper limits of normal. No pericardial fluid. Extensive coronary artery calcification. Atherosclerotic disease of the aorta. Pulmonary arterial opacification is good. Study is positive for small emboli in the right lower lobe pulmonary arterial tree. No massive or submassive PE. Right ventricular to left ventricular ratio is 1.16, but this probably reflects chronic disease rather than being attributable to the single visible embolus in the right lower lobe. Mediastinum/Nodes: No mediastinal or hilar mass or lymphadenopathy. Lungs/Pleura: Advanced emphysema, diffuse but with upper lung predominance. No infiltrate, collapse or effusion. No mass or nodule in need of further follow-up. Upper Abdomen: Negative Musculoskeletal: Ordinary chronic thoracic degenerative changes. Multilevel bridging osteophytes suggesting diffuse idiopathic skeletal hyperostosis. Old minor loss of height at T5. Review of the MIP images confirms the above findings. IMPRESSION: 1. Study is positive for small emboli in the right lower lobe pulmonary arterial tree. No massive or submassive PE. 2. Advanced emphysema, diffuse but with upper lung predominance. 3. Extensive coronary artery calcification. 4. These results were called by telephone at the time of interpretation on 03/17/2023 at 12:54 pm to provider [facility] , who verbally acknowledged these results. 1 Aortic Atherosclerosis (ICD10-I70.0) and Emphysema (ICD10-J43.9). Electronically Signed   By:  Paulina Fusi M.D.   On: 03/17/2023 12:57   DG Chest Port 1 View  Result Date: 03/17/2023 CLINICAL DATA:  sob EXAM: PORTABLE CHEST 1 VIEW COMPARISON:  CXR 03/15/23 FINDINGS: No pleural effusion. No pneumothorax. No focal airspace opacity. Normal cardiac and mediastinal contours. No radiographically apparent displaced rib fractures. Visualized upper abdomen is unremarkable IMPRESSION: No focal airspace opacity. Electronically Signed   By: Lorenza Cambridge M.D.   On: 03/17/2023 11:03   DG Chest 2 View  Result Date: 03/15/2023 CLINICAL DATA:  Shortness of breath EXAM: CHEST - 2 VIEW COMPARISON:  Chest x-ray dated November 11, 2022 FINDINGS: Cardiac and mediastinal contours are unchanged. Unchanged linear opacity of the right middle lobe, likely due to scarring. New nodular opacity of the left lower lobe. Emphysema. No focal consolidation. No evidence of pleural effusion or pneumothorax. IMPRESSION: 1. New nodular opacity of the left lower lobe. Recommend further evaluation with chest CT. 2. Emphysema. Electronically Signed   By: Allegra Lai M.D.   On: 03/15/2023 11:21    Microbiology: Results for orders placed or performed  during the hospital encounter of 03/17/23  Resp panel by RT-PCR (RSV, Flu A&B, Covid) Anterior Nasal Swab     Status: None   Collection Time: 03/17/23 10:40 AM   Specimen: Anterior Nasal Swab  Result Value Ref Range Status   SARS Coronavirus 2 by RT PCR NEGATIVE NEGATIVE Final    Comment: (NOTE) SARS-CoV-2 target nucleic acids are NOT DETECTED.  The SARS-CoV-2 RNA is generally detectable in upper respiratory specimens during the acute phase of infection. The lowest concentration of SARS-CoV-2 viral copies this assay can detect is 138 copies/mL. A negative result does not preclude SARS-Cov-2 infection and should not be used as the sole basis for treatment or other patient management decisions. A negative result may occur with  improper specimen collection/handling, submission of  specimen other than nasopharyngeal swab, presence of viral mutation(s) within the areas targeted by this assay, and inadequate number of viral copies(<138 copies/mL). A negative result must be combined with clinical observations, patient history, and epidemiological information. The expected result is Negative.  Fact Sheet for Patients:  BloggerCourse.com  Fact Sheet for Healthcare Providers:  SeriousBroker.it  This test is no t yet approved or cleared by the [place] FDA and  has been authorized for detection and/or diagnosis of SARS-CoV-2 by FDA under an Emergency Use Authorization (EUA). This EUA will remain  in effect (meaning this test can be used) for the duration of the COVID-19 declaration under Section 564(b)(1) of the Act, 21 U.S.C.section 360bbb-3(b)(1), unless the authorization is terminated  or revoked sooner.       Influenza A by PCR NEGATIVE NEGATIVE Final   Influenza B by PCR NEGATIVE NEGATIVE Final    Comment: (NOTE) The Xpert Xpress SARS-CoV-2/FLU/RSV plus assay is intended as an aid in the diagnosis of influenza from Nasopharyngeal swab specimens and should not be used as a sole basis for treatment. Nasal washings and aspirates are unacceptable for Xpert Xpress SARS-CoV-2/FLU/RSV testing.  Fact Sheet for Patients: BloggerCourse.com  Fact Sheet for Healthcare Providers: SeriousBroker.it  This test is not yet approved or cleared by the [place] FDA and has been authorized for detection and/or diagnosis of SARS-CoV-2 by FDA under an Emergency Use Authorization (EUA). This EUA will remain in effect (meaning this test can be used) for the duration of the COVID-19 declaration under Section 564(b)(1) of the Act, 21 U.S.C. section 360bbb-3(b)(1), unless the authorization is terminated or revoked.     Resp Syncytial Virus by PCR NEGATIVE NEGATIVE Final     Comment: (NOTE) Fact Sheet for Patients: BloggerCourse.com  Fact Sheet for Healthcare Providers: SeriousBroker.it  This test is not yet approved or cleared by the [place] FDA and has been authorized for detection and/or diagnosis of SARS-CoV-2 by FDA under an Emergency Use Authorization (EUA). This EUA will remain in effect (meaning this test can be used) for the duration of the COVID-19 declaration under Section 564(b)(1) of the Act, 21 U.S.C. section 360bbb-3(b)(1), unless the authorization is terminated or revoked.  Performed at [facility], [address], [place], [place] [address]     Labs: CBC: Recent Labs  Lab 03/17/23 1038  WBC 4.3  NEUTROABS 2.8  HGB 13.3  HCT 41.5  MCV 91.4  PLT 159   Basic Metabolic Panel: Recent Labs  Lab 03/17/23 1038  NA 139  K 4.2  CL 104  CO2 27  GLUCOSE 117*  BUN 13  CREATININE 1.25*  CALCIUM 8.6*   Liver Function Tests: No results for input(s): "AST", "ALT", "ALKPHOS", "  BILITOT", "PROT", "ALBUMIN" in the last 168 hours. CBG: No results for input(s): "GLUCAP" in the last 168 hours.  Discharge time spent: greater than 30 minutes.  Signed: Tyrone Nine, MD [facility] 03/19/2023

## 2023-03-19 NOTE — Progress Notes (Addendum)
SATURATION QUALIFICATIONS: (This note is used to comply with regulatory documentation for home oxygen)  Patient Saturations on Room Air at Rest = 92%  Patient Saturations on Room Air while Ambulating = 86%  Patient Saturations on 0 Liters of oxygen while Ambulating = 86%  Please briefly explain why patient needs home oxygen: O2 saturation decreased to 86% while ambulating on room air. Pt recovered to 92% within 1 minute of sitting down to rest.   REVIEWED AND AGREE, PT REQUIRES HOME OXYGEN AT DISCHARGE Hazeline Junker, MD 03/19/2023 12:08 PM

## 2023-03-19 NOTE — Plan of Care (Signed)

## 2023-03-19 NOTE — Care Management Important Message (Signed)
Important Message  Patient Details  Name: RIKKI HAMDAN MRN: [id number] Date of Birth: 1935/12/04   Medicare Important Message Given:  N/A - LOS <3 / Initial given by admissions     Corey Harold 03/19/2023, 1:12 PM

## 2023-03-23 DIAGNOSIS — Z87891 Personal history of nicotine dependence: Secondary | ICD-10-CM | POA: Diagnosis not present

## 2023-03-23 DIAGNOSIS — J441 Chronic obstructive pulmonary disease with (acute) exacerbation: Secondary | ICD-10-CM | POA: Diagnosis not present

## 2023-03-23 DIAGNOSIS — I2699 Other pulmonary embolism without acute cor pulmonale: Secondary | ICD-10-CM | POA: Diagnosis not present

## 2023-03-23 DIAGNOSIS — I82409 Acute embolism and thrombosis of unspecified deep veins of unspecified lower extremity: Secondary | ICD-10-CM | POA: Diagnosis not present

## 2023-03-26 ENCOUNTER — Ambulatory Visit (HOSPITAL_COMMUNITY)
Admission: RE | Admit: 2023-03-26 | Discharge: 2023-03-26 | Disposition: A | Discharge status: Home / Self Care | Payer: Medicare Other | Source: Ambulatory Visit | Attending: Family Medicine | Admitting: Family Medicine

## 2023-03-26 ENCOUNTER — Encounter (HOSPITAL_COMMUNITY): Payer: Self-pay

## 2023-03-26 DIAGNOSIS — R0600 Dyspnea, unspecified: Secondary | ICD-10-CM

## 2023-04-07 NOTE — Progress Notes (Unsigned)
Jeremy [name], male    DOB: August 30, 1936,    MRN: [id number]   Brief patient profile:  42  yowm quit smoking 1990 p MI  referred to pulmonary clinic in [place]  10/13/2021 by Dr Jeremy [name] for copd eval with Nov 2022 > ER with dx aecopd with GOLD 2 criteria in 2017 but not requiring any maint rx up until Nov 2022     History of Present Illness  10/13/2021  Pulmonary/ 1st office eval/ Jeremy [name] / [name] / on ACEi/breztri  Chief Complaint  Patient presents with   Consult    Patient had covid in 2019. Patient states that he does feel better now. States that he really needed to be seen a month ago but is better now. States that he is able to walk longer distance now than before.   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 Much worse in am's historically but better now  Cough: none but still urge to clear his throat Sleep: bed blocks/ one pillow SABA use: much less now breztri one bid / maybe one albuterol hfa/ neb one each am  Rec Ok to continue lisinopril but it may be part of the problem with your throat (clearing, choking, gagging, something  stuck, problem swallowing)   Plan A = Automatic = Always=    Breztri Take 1-2 puffs first thing in am and then another 2 puffs about 12 hours later.   Work on inhaler technique:   Plan B = Backup (to supplement plan A, not to replace it)Only use your albuterol inhaler as a rescue medication to be used if you can't catch your breath by resting or doing a relaxed purse lip pattern.  Plan C = Crisis (instead of Plan B but only if Plan B stops working) - only use your albuterol nebulizer if you first try Plan B   GERD diet reviewed, bed blocks rec    Admit date: 11/22/2021 Discharge date: 11/27/2021 Discharge Condition:stable Discharge Diagnoses:  Principal Problem:   COPD exacerbation (HCC)   Essential hypertension   Hyperlipidemia   CAD (coronary artery disease)   COPD GOLD 2    Reflux esophagitis    Hypokalemia   COPD exacerbation (HCC) vs ACEi case  Continue breztri on discharge Continue DuoNebs Treated with solumedrol, transition to prednisone taper on discharge Continue Mucinex Completed doxycycline as inpatient Pulmonology following, appreciate assistance Lisinopril discontinued in favor of losartan    01/18/2023  f/u ov/[name] re: GOLD 2 copd maint on breztri  Chief Complaint  Patient presents with   Acute Visit    Pt f/u states that he is having some increased SOB, which has lead to him doing neb treatments and the albuterol more frequently. He does feel like the   Dyspnea:  worse x 2 weeks  Cough: more cough esp in am slt discolored, then better by afternoon Sleeping: still wheezing some at hs per wife  SABA use: using neb sev times a week  02: none  Rec Prednisone 10 mg take  4 each am x 2 days,   2 each am x 2 days,  1 each am x 2 days and stop  Plan A = Automatic = Always=   stop breztri when your shipment arrives arrives:    Formoterol/budesonide first thing in am and repeat in am  Plan B = Backup (to supplement plan A, not to replace it) Only use your albuterol inhaler as  a rescue medication Plan C = Crisis (instead of Plan B but only if Plan B stops working) - only use your albuterol-ipatropium  nebulizer if you first try Plan B     02/17/2023  f/u ov/[name] re: GOLD 2 copd  maint on performist/bud  Chief Complaint  Patient presents with   Follow-up    Pt f/u states that he is doing well SOB is baseline and he is using his nebs twice daily. He does see a benefit in them  Dyspnea:  can walk for < 5 min slow but also slowed by L hip  Cough: not much  Sleeping: no more noct wheeze lying flat SABA use: none  02: none  Rec Add yupelri  175 mcg   to see if you can walk faster and farther  - call after a couple of weeks if you're breathing is better   Please schedule a follow up visit in 3 months but call sooner if needed     04/08/2023  f/u ov/[name] re: *** maint on ***  No chief complaint on file.   Dyspnea:  *** Cough: *** Sleeping: *** SABA use: *** 02: *** Covid status: *** Lung cancer screening: ***   No obvious day to day or daytime variability or assoc excess/ purulent sputum or mucus plugs or hemoptysis or cp or chest tightness, subjective wheeze or overt sinus or hb symptoms.   *** without nocturnal  or early am exacerbation  of respiratory  c/o's or need for noct saba. Also denies any obvious fluctuation of symptoms with weather or environmental changes or other aggravating or alleviating factors except as outlined above   No unusual exposure hx or h/o childhood pna/ asthma or knowledge of premature birth.  Current Allergies, Complete Past Medical History, Past Surgical History, Family History, and Social History were reviewed in [employer] record.  ROS  The following are not active complaints unless bolded Hoarseness, sore throat, dysphagia, dental problems, itching, sneezing,  nasal congestion or discharge of excess mucus or purulent secretions, ear ache,   fever, chills, sweats, unintended wt loss or wt gain, classically pleuritic or exertional cp,  orthopnea pnd or arm/hand swelling  or leg swelling, presyncope, palpitations, abdominal pain, anorexia, nausea, vomiting, diarrhea  or change in bowel habits or change in bladder habits, change in stools or change in urine, dysuria, hematuria,  rash, arthralgias, visual complaints, headache, numbness, weakness or ataxia or problems with walking or coordination,  change in mood or  memory.        No outpatient medications have been marked as taking for the 04/08/23 encounter (Appointment) with Nyoka Cowden, MD.           Past Medical History:  Diagnosis Date   Abnormal CXR 4/20011   Chest  CT mode/serere COPD ( no mediastinal abnormality)   CAD (coronary artery disease) 2005   anterior MI with stent  to the LAD in 1991 / stent 1996 / nuclear 2005, no ischemia   Carotid bruit    Doppler, June, 2013, 0-39% bilateral   COPD (chronic obstructive pulmonary disease) (HCC)    Diverticula of colon    Ejection fraction    55-60%, echo, April, 2011, could not estimate RV pressure   Esophagitis    HOH (hard of hearing)    Hyperlipidemia    Hypertension    Myocardial infarction (HCC)    x2   Shortness of breath    with exertion   Sleep  apnea    Stop Bang score of 5   Tobacco abuse         Objective:    Wts  04/08/2023        ***  02/17/2023         187 01/18/2023         190  11/03/2022       190   08/05/2022     193 02/11/2022       187   12/05/21 185 lb 9.6 oz (84.2 kg)  11/22/21 187 lb (84.8 kg)  10/13/21 187 lb (84.8 kg)    Vital signs reviewed  04/08/2023  - Note at rest 02 sats  ***% on ***   General appearance:    ***     Mild bar***    Assessment

## 2023-04-08 ENCOUNTER — Other Ambulatory Visit (HOSPITAL_COMMUNITY): Payer: Self-pay

## 2023-04-08 ENCOUNTER — Encounter: Payer: Self-pay | Admitting: Internal Medicine

## 2023-04-08 ENCOUNTER — Ambulatory Visit (INDEPENDENT_AMBULATORY_CARE_PROVIDER_SITE_OTHER): Payer: Medicare Other | Admitting: Internal Medicine

## 2023-04-08 ENCOUNTER — Telehealth: Payer: Self-pay | Admitting: Internal Medicine

## 2023-04-08 VITALS — BP 124/71 | HR 96 | Ht 72.0 in | Wt 186.0 lb

## 2023-04-08 DIAGNOSIS — J449 Chronic obstructive pulmonary disease, unspecified: Secondary | ICD-10-CM

## 2023-04-08 MED ORDER — YUPELRI 175 MCG/3ML IN SOLN: 175.0000 ug | Freq: Every day | RESPIRATORY_TRACT | 11 refills | Status: DC

## 2023-04-08 MED ORDER — PREDNISONE 10 MG PO TABS: ORAL_TABLET | ORAL | 5 refills | Status: DC

## 2023-04-08 MED ORDER — FAMOTIDINE 20 MG PO TABS
ORAL_TABLET | ORAL | 11 refills | Status: DC
Start: 2023-04-08 — End: 2023-07-05

## 2023-04-08 NOTE — Telephone Encounter (Signed)
Disregard

## 2023-04-08 NOTE — Telephone Encounter (Signed)
Received call from [facility] stating patient had reached out to them about receiving Yupelri nebs.  Per [facility] patient insurance will completely cover Perforomist and Yupelri nebs.  [facility] had received Perforomist prescription 01/2023 but requested Yupelri prescription.  Per Dr. Thurston Hole LOV note 04/08/23 patient was to use [place] daily.  Yupelri prescription sent to [facility].  Nothing further at this time.  LOV note 04/08/23  Instructions  Plan A = Automatic = Always=    performist/yupelri each am and performist 12 hours later    Plan B = Backup (to supplement plan A, not to replace it) Only use your albuterol nebulizer as a rescue medication to be used if you can't catch your breath by resting or doing a relaxed purse lip breathing pattern.  - The less you use it, the better it will work when you need it. - Ok to use the inhaler up to   4 hours if you must but call for appointment if use goes up over your usual need     Plan C = Crisis (instead of Plan B but only if Plan B stops working) Prednisone 10 mg take  4 each am x 2 days,   2 each am x 2 days,  1 each am x 2 days and stop    Ok to use Mucinex dm 1200 mg every 12 hours as needed for cough    Ok to use 02 as needed but goal is to keep the oxygen meter over 90%      Please keep  follow up office  but call sooner if needed  - bring  all medications/ solutions so we can be

## 2023-04-08 NOTE — Assessment & Plan Note (Addendum)
Quit smoking 1990  - Spirometry 11/08/15   FEV1 1.89 (58%)  Ratio 0.49 p 10% improvement from saba  - 10/13/2021  After extensive coaching inhaler device,  effectiveness =    75% (short Ti)  - d/c acei 11/22/21 > all wheeze resolve by 12/05/2021 ov - PFT's  12/03/21  FEV1 1.76 (58 % ) ratio 0.42  p 10 % improvement from saba p 0 prior to study with DLCO  14.04 (55%) corrects to 2.66  (69%)  for alv volume and FV curve classically concave    - 11/03/2022  After extensive coaching inhaler device,  effectiveness =    80% (slt delayed trigger) - 01/18/2023 flared on breztri/  "neb is the only thing that helps"  with assoc throat clearing ? Adverse effects of hfa so rec change to neb formoterol/bud   - 02/17/2023 added yupelri  - 04/08/2023 added prn pred x 6 d and if needing freq next step is add budesonide to neb  - 04/08/2023   Walked on RA  x  2  lap(s) =  approx 300  ft  @  mod pace, stopped due to sob with lowest 02 sats 89%    He is actually wheezing this am after taking neb saba and lama/laba w/in 2 h of ov so incouraged to take the pred x 6 days then if finds needs freq add budesonide  Ok to keep 02 for now and use prn with target sats > 90% either by better pacing or by using 02 as previously  rec   Return in 6 weeks with all meds in hand using a trust but verify approach to confirm accurate Medication  Reconciliation The principal here is that until we are certain that the  patients are doing what we've asked, it makes no sense to ask them to do more.   Each maintenance medication was reviewed in detail including emphasizing most importantly the difference between maintenance and prns and under what circumstances the prns are to be triggered using an action plan format where appropriate.  Total time for H and P, chart review, counseling, reviewing neb/02 device(s) , directly observing portions of ambulatory 02 saturation study/ and generating customized AVS unique to this office visit / same day  charting = 30 min post hosp f/u

## 2023-04-08 NOTE — Patient Instructions (Addendum)
Plan A = Automatic = Always=    performist/yupelri each am and performist 12 hours later   Plan B = Backup (to supplement plan A, not to replace it) Only use your albuterol nebulizer as a rescue medication to be used if you can't catch your breath by resting or doing a relaxed purse lip breathing pattern.  - The less you use it, the better it will work when you need it. - Ok to use the inhaler up to   4 hours if you must but call for appointment if use goes up over your usual need    Plan C = Crisis (instead of Plan B but only if Plan B stops working) Prednisone 10 mg take  4 each am x 2 days,   2 each am x 2 days,  1 each am x 2 days and stop   Ok to use Mucinex dm 1200 mg every 12 hours as needed for cough   Ok to use 02 as needed but goal is to keep the oxygen meter over 90%    Please keep  follow up office  but call sooner if needed  - bring  all medications/ solutions so we can be

## 2023-04-14 DIAGNOSIS — N1831 Chronic kidney disease, stage 3a: Secondary | ICD-10-CM | POA: Diagnosis not present

## 2023-04-14 DIAGNOSIS — E539 Vitamin B deficiency, unspecified: Secondary | ICD-10-CM | POA: Diagnosis not present

## 2023-04-14 DIAGNOSIS — R7301 Impaired fasting glucose: Secondary | ICD-10-CM | POA: Diagnosis not present

## 2023-04-14 DIAGNOSIS — I10 Essential (primary) hypertension: Secondary | ICD-10-CM | POA: Diagnosis not present

## 2023-04-20 ENCOUNTER — Ambulatory Visit: Payer: Medicare Other | Attending: Cardiology | Admitting: Cardiology

## 2023-04-20 VITALS — BP 118/78 | HR 89 | Ht 72.0 in | Wt 189.9 lb

## 2023-04-20 DIAGNOSIS — E782 Mixed hyperlipidemia: Secondary | ICD-10-CM | POA: Insufficient documentation

## 2023-04-20 DIAGNOSIS — I10 Essential (primary) hypertension: Secondary | ICD-10-CM | POA: Insufficient documentation

## 2023-04-20 DIAGNOSIS — I2510 Atherosclerotic heart disease of native coronary artery without angina pectoris: Secondary | ICD-10-CM | POA: Insufficient documentation

## 2023-04-20 NOTE — Progress Notes (Signed)
Clinical Summary Jeremy [name] is a 87 y.o.male seen for the following medical problems.    1. CAD - history of prior stenting to LAD - nuclear stress 03/2014 with no ischemia. LVEF 47%.  - admit 04/2016 with chest pain, negative EKG and enzymes. Symptoms better with belching, thought GI etiology. He had been off his protonix at the time. Symptoms improved after restarting.    - 03/2022 echo LVEF 50-55%, grade I dd,  - 03/2023 echo: LVEF 65-70%, poorly seen RV grossly normal  - no recent chest pains. Chronic SOB/DOE better with his nebulizer.     2. COPD - PFTs 10/2015 with moderate COPD 11/2021 admission with COPD exacerbation   - admit 03/2023 after COPD exacerbation.  - chronic SOB better with nebulizer   3. Hyperlipidemia - he is on atorvastatin 80mg  daily - 03/2023 TC 113 TG 73 HDL 38 LDL 60       4. History of GI bleed - admit 11/2017 with rectal bleeding. EGD showed esophagitis, gastritis but no active bleeding.     5. HTN - he is compliant with meds     6. Leg pain - no pain at rest. Pins and needles like pain, lower back pain No consistent with claudicatio  7. 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 Past Medical History:  Diagnosis Date   Abnormal CXR 4/20011   Chest  CT mode/serere COPD ( no mediastinal abnormality)   CAD (coronary artery disease) 2005   anterior MI with stent to the LAD in 1991 / stent 1996 / nuclear 2005, no ischemia   Carotid bruit    Doppler, June, 2013, 0-39% bilateral   COPD (chronic obstructive pulmonary disease) (HCC)    Diverticula of colon    Ejection fraction    55-60%, echo, April, 2011, could not estimate RV pressure   Esophagitis    HOH (hard of hearing)    Hyperlipidemia    Hypertension    Myocardial infarction (HCC)    x2   Shortness of  breath    with exertion   Sleep apnea    Stop Bang score of 5   Tobacco abuse      No Known Allergies   Current Outpatient Medications  Medication Sig Dispense Refill   albuterol (PROVENTIL HFA;VENTOLIN HFA) 108 (90 Base) MCG/ACT inhaler Inhale 1-2 puffs into the lungs every 6 (six) hours as needed for wheezing or shortness of breath.     apixaban (ELIQUIS) 5 MG TABS tablet Take 2 tablets (10mg ) twice daily for 7 days, then 1 tablet (5mg ) twice daily 60 tablet 1   aspirin 81 MG tablet Take 1 tablet (81 mg total) by mouth every evening. 30 tablet    atorvastatin (LIPITOR) 80 MG tablet Take 1 tablet (80 mg total) by mouth daily. 90 tablet 3   benzonatate (TESSALON) 100 MG capsule Take 100 mg by mouth 3 (three) times daily as needed.     Cholecalciferol (VITAMIN D3) 50 MCG (2000 UT) CHEW Chew 1 tablet by mouth daily.     famotidine (PEPCID) 20 MG tablet One after supper 30 tablet 11   formoterol (PERFOROMIST) 20 MCG/2ML nebulizer solution One vial every 12 hours 120 mL 11   ipratropium-albuterol (DUONEB) 0.5-2.5 (3) MG/3ML  SOLN Take 3 mLs by nebulization every 4 (four) hours as needed (Shortness of breath).     levocetirizine (XYZAL) 5 MG tablet Take 5 mg by mouth every evening.     losartan (COZAAR) 100 MG tablet Take 100 mg by mouth daily.     nitroGLYCERIN (NITROSTAT) 0.4 MG SL tablet PLACE 1 TABLET UNDER THE TONGUE EVERY 5 MINUTES AS NEEDED FOR CHEST PAIN. MAX 3 TABLETS PER EVENT. CALL 911 25 tablet 3   pantoprazole (PROTONIX) 40 MG tablet Take 1 tablet (40 mg total) by mouth 2 (two) times daily. 180 tablet 3   predniSONE (DELTASONE) 10 MG tablet Take  4 each am x 2 days,   2 each am x 2 days,  1 each am x 2 days and stop 14 tablet 5   revefenacin (YUPELRI) 175 MCG/3ML nebulizer solution Take 3 mLs (175 mcg total) by nebulization daily. 90 mL 11   No current facility-administered medications for this visit.     Past Surgical History:  Procedure Laterality Date   APPENDECTOMY      BIOPSY  11/17/2017   Procedure: BIOPSY;  Surgeon: Malissa Hippo, MD;  Location: [facility];  Service: Endoscopy;;  gastric   CARDIAC CATHETERIZATION     CATARACT EXTRACTION Left    CATARACT EXTRACTION W/PHACO Right 01/22/2014   Procedure: CATARACT EXTRACTION PHACO AND INTRAOCULAR LENS PLACEMENT (IOC);  Surgeon: Gemma Payor, MD;  Location: [facility];  Service: Ophthalmology;  Laterality: Right;  CDE 7.46   CHOLECYSTECTOMY     8 years ago [facility]   COLONOSCOPY  10/05/2012   Dr. Karilyn Cota: multiple diverticula at sigmoid colon, multiple polyps (tubular adenomas), small external hemorrhoids   COLONOSCOPY N/A 11/18/2017   Procedure: COLONOSCOPY;  Surgeon: Malissa Hippo, MD;  [facility]: [facility];  Service: Endoscopy;  Laterality: N/A;   COLONOSCOPY WITH PROPOFOL N/A 05/07/2021   Procedure: COLONOSCOPY WITH PROPOFOL;  Surgeon: Malissa Hippo, MD;  [facility]: [facility];  Service: Endoscopy;  Laterality: N/A;   CYST REMOVAL HAND     right wrist   ESOPHAGEAL DILATION N/A 05/23/2015   Procedure: ESOPHAGEAL DILATION;  Surgeon: Corbin Ade, MD;  [facility]: [facility];  Service: Endoscopy;  Laterality: N/A;   ESOPHAGEAL DILATION N/A 11/17/2017   Procedure: ESOPHAGEAL DILATION;  Surgeon: Malissa Hippo, MD;  [facility]: [facility];  Service: Endoscopy;  Laterality: N/A;   ESOPHAGEAL DILATION N/A 05/07/2021   Procedure: ESOPHAGEAL DILATION;  Surgeon: Malissa Hippo, MD;  [facility];  Service: Endoscopy;  Laterality: N/A;   ESOPHAGOGASTRODUODENOSCOPY N/A 05/23/2015   Dr. Jena Gauss: Ulcerative/ erosive reflux esophagitis with peptic stricture formation. Status post dilation as described. Hiatal hernia. Abnormal gastric mucosa of uncertain significance status post gastric biospy. +H.pylori   ESOPHAGOGASTRODUODENOSCOPY N/A 11/17/2017   Procedure: ESOPHAGOGASTRODUODENOSCOPY (EGD);  Surgeon: Malissa Hippo, MD;  [facility];  Service: Endoscopy;  Laterality: N/A;    ESOPHAGOGASTRODUODENOSCOPY (EGD) WITH PROPOFOL N/A 05/07/2021   Procedure: ESOPHAGOGASTRODUODENOSCOPY (EGD) WITH PROPOFOL;  Surgeon: Malissa Hippo, MD;  [facility]: [facility];  Service: Endoscopy;  Laterality: N/A;   PERCUTANEOUS STENT INTERVENTION     SKIN CANCER EXCISION  2015   ear & neck   TONSILLECTOMY       No Known Allergies    Family History  Problem Relation Age of Onset   Heart disease Mother    Heart disease Father    Heart disease Brother    Stomach cancer Brother  Colon cancer Neg Hx      Social History Mr. Turso reports that he quit smoking about 34 years ago. His smoking use included cigarettes. He started smoking about 39 years ago. He has a 36 pack-year smoking history. His smokeless tobacco use includes chew. Mr. Barriere reports no history of alcohol use.   Review of Systems CONSTITUTIONAL: No weight loss, fever, chills, weakness or fatigue.  HEENT: Eyes: No visual loss, blurred vision, double vision or yellow sclerae.No hearing loss, sneezing, congestion, runny nose or sore throat.  SKIN: No rash or itching.  CARDIOVASCULAR: per hpi RESPIRATORY: No shortness of breath, cough or sputum.  GASTROINTESTINAL: No anorexia, nausea, vomiting or diarrhea. No abdominal pain or blood.  GENITOURINARY: No burning on urination, no polyuria NEUROLOGICAL: No headache, dizziness, syncope, paralysis, ataxia, numbness or tingling in the extremities. No change in bowel or bladder control.  MUSCULOSKELETAL: No muscle, back pain, joint pain or stiffness.  LYMPHATICS: No enlarged nodes. No history of splenectomy.  PSYCHIATRIC: No history of depression or anxiety.  ENDOCRINOLOGIC: No reports of sweating, cold or heat intolerance. No polyuria or polydipsia.  Marland Kitchen   Physical Examination Today's Vitals   04/20/23 1509  BP: 118/78  Pulse: 89  SpO2: 96%  Weight: 189 lb 14.4 oz (86.1 kg)  Height: 6' (1.829 m)  PainSc: 0-No pain   Body mass index is 25.76 kg/m.  Gen:  resting comfortably, no acute distress HEENT: no scleral icterus, pupils equal round and reactive, no palptable cervical adenopathy,  CV: RRR, no m/rg, no jvd Resp: Clear to auscultation bilaterally GI: abdomen is soft, non-tender, non-distended, normal bowel sounds, no hepatosplenomegaly MSK: extremities are warm, no edema.  Skin: warm, no rash Neuro:  no focal deficits Psych: appropriate affect   Diagnostic Studies  03/2022 echo 1. Left ventricular ejection fraction, by estimation, is approximately  55%. The left ventricle has normal function. The left ventricle has no  regional wall motion abnormalities. Left ventricular diastolic parameters  are consistent with Grade I diastolic   dysfunction (impaired relaxation). The average left ventricular global  longitudinal strain is -18.0 %. The global longitudinal strain is normal.   2. Right ventricular systolic function is normal. The right ventricular  size is normal. There is normal pulmonary artery systolic pressure. The  estimated right ventricular systolic pressure is 11.6 mmHg.   3. The mitral valve is grossly normal. Trivial mitral valve  regurgitation.   4. The aortic valve is tricuspid. Aortic valve regurgitation is not  visualized. Aortic valve sclerosis is present, with no evidence of aortic  valve stenosis.   5. The inferior vena cava is normal in size with greater than 50%  respiratory variability, suggesting right atrial pressure of 3 mmHg.            Assessment and Plan   1. CAD - no recent chest pains, chronic SOB better with nebulizer consistent with his COPD - since on eliquis can d/c aspirin.     2. Hyperlipidemia -at goal, continue current meds     3. HTN - bp is at goal, continue current meds  F/u 6 months     Antoine Poche, M.D.

## 2023-04-20 NOTE — Patient Instructions (Signed)
Medication Instructions:  Stop Aspirin Continue all other medications.     Labwork: none  Testing/Procedures: none  Follow-Up: 6 months   Any Other Special Instructions Will Be Listed Below (If Applicable).   If you need a refill on your cardiac medications before your next appointment, please call your pharmacy.  

## 2023-04-23 DIAGNOSIS — E539 Vitamin B deficiency, unspecified: Secondary | ICD-10-CM | POA: Diagnosis not present

## 2023-04-23 DIAGNOSIS — J309 Allergic rhinitis, unspecified: Secondary | ICD-10-CM | POA: Diagnosis not present

## 2023-04-23 DIAGNOSIS — N1831 Chronic kidney disease, stage 3a: Secondary | ICD-10-CM | POA: Diagnosis not present

## 2023-04-23 DIAGNOSIS — I2699 Other pulmonary embolism without acute cor pulmonale: Secondary | ICD-10-CM | POA: Diagnosis not present

## 2023-04-23 DIAGNOSIS — D6869 Other thrombophilia: Secondary | ICD-10-CM | POA: Diagnosis not present

## 2023-04-23 DIAGNOSIS — I129 Hypertensive chronic kidney disease with stage 1 through stage 4 chronic kidney disease, or unspecified chronic kidney disease: Secondary | ICD-10-CM | POA: Diagnosis not present

## 2023-04-23 DIAGNOSIS — J441 Chronic obstructive pulmonary disease with (acute) exacerbation: Secondary | ICD-10-CM | POA: Diagnosis not present

## 2023-04-23 DIAGNOSIS — E782 Mixed hyperlipidemia: Secondary | ICD-10-CM | POA: Diagnosis not present

## 2023-04-23 DIAGNOSIS — I700 Atherosclerosis of aorta: Secondary | ICD-10-CM | POA: Diagnosis not present

## 2023-04-23 DIAGNOSIS — I82409 Acute embolism and thrombosis of unspecified deep veins of unspecified lower extremity: Secondary | ICD-10-CM | POA: Diagnosis not present

## 2023-04-23 DIAGNOSIS — I7143 Infrarenal abdominal aortic aneurysm, without rupture: Secondary | ICD-10-CM | POA: Diagnosis not present

## 2023-04-23 DIAGNOSIS — J449 Chronic obstructive pulmonary disease, unspecified: Secondary | ICD-10-CM | POA: Diagnosis not present

## 2023-04-29 DIAGNOSIS — Z9842 Cataract extraction status, left eye: Secondary | ICD-10-CM

## 2023-04-29 DIAGNOSIS — Z961 Presence of intraocular lens: Secondary | ICD-10-CM | POA: Diagnosis present

## 2023-04-29 DIAGNOSIS — Z8249 Family history of ischemic heart disease and other diseases of the circulatory system: Secondary | ICD-10-CM

## 2023-04-29 DIAGNOSIS — K2100 Gastro-esophageal reflux disease with esophagitis, without bleeding: Secondary | ICD-10-CM | POA: Diagnosis present

## 2023-04-29 DIAGNOSIS — N179 Acute kidney failure, unspecified: Secondary | ICD-10-CM | POA: Diagnosis not present

## 2023-04-29 DIAGNOSIS — F419 Anxiety disorder, unspecified: Secondary | ICD-10-CM | POA: Diagnosis present

## 2023-04-29 DIAGNOSIS — H9190 Unspecified hearing loss, unspecified ear: Secondary | ICD-10-CM | POA: Diagnosis present

## 2023-04-29 DIAGNOSIS — K922 Gastrointestinal hemorrhage, unspecified: Secondary | ICD-10-CM | POA: Diagnosis not present

## 2023-04-29 DIAGNOSIS — Z86711 Personal history of pulmonary embolism: Secondary | ICD-10-CM

## 2023-04-29 DIAGNOSIS — E782 Mixed hyperlipidemia: Secondary | ICD-10-CM | POA: Diagnosis not present

## 2023-04-29 DIAGNOSIS — Z85828 Personal history of other malignant neoplasm of skin: Secondary | ICD-10-CM | POA: Diagnosis not present

## 2023-04-29 DIAGNOSIS — Z720 Tobacco use: Secondary | ICD-10-CM | POA: Diagnosis not present

## 2023-04-29 DIAGNOSIS — K5792 Diverticulitis of intestine, part unspecified, without perforation or abscess without bleeding: Secondary | ICD-10-CM | POA: Diagnosis not present

## 2023-04-29 DIAGNOSIS — E785 Hyperlipidemia, unspecified: Secondary | ICD-10-CM | POA: Diagnosis present

## 2023-04-29 DIAGNOSIS — J9621 Acute and chronic respiratory failure with hypoxia: Secondary | ICD-10-CM | POA: Diagnosis not present

## 2023-04-29 DIAGNOSIS — Z7901 Long term (current) use of anticoagulants: Secondary | ICD-10-CM | POA: Diagnosis not present

## 2023-04-29 DIAGNOSIS — G4730 Sleep apnea, unspecified: Secondary | ICD-10-CM | POA: Diagnosis present

## 2023-04-29 DIAGNOSIS — D62 Acute posthemorrhagic anemia: Secondary | ICD-10-CM | POA: Diagnosis not present

## 2023-04-29 DIAGNOSIS — I10 Essential (primary) hypertension: Secondary | ICD-10-CM | POA: Diagnosis present

## 2023-04-29 DIAGNOSIS — Z86718 Personal history of other venous thrombosis and embolism: Secondary | ICD-10-CM | POA: Diagnosis not present

## 2023-04-29 DIAGNOSIS — J441 Chronic obstructive pulmonary disease with (acute) exacerbation: Secondary | ICD-10-CM | POA: Diagnosis not present

## 2023-04-29 DIAGNOSIS — J439 Emphysema, unspecified: Secondary | ICD-10-CM | POA: Diagnosis present

## 2023-04-29 DIAGNOSIS — K5731 Diverticulosis of large intestine without perforation or abscess with bleeding: Principal | ICD-10-CM | POA: Diagnosis present

## 2023-04-29 DIAGNOSIS — R0602 Shortness of breath: Secondary | ICD-10-CM | POA: Diagnosis not present

## 2023-04-29 DIAGNOSIS — I252 Old myocardial infarction: Secondary | ICD-10-CM

## 2023-04-29 DIAGNOSIS — R000 Tachycardia, unspecified: Secondary | ICD-10-CM | POA: Diagnosis not present

## 2023-04-29 DIAGNOSIS — K5791 Diverticulosis of intestine, part unspecified, without perforation or abscess with bleeding: Secondary | ICD-10-CM | POA: Diagnosis not present

## 2023-04-29 DIAGNOSIS — Z79899 Other long term (current) drug therapy: Secondary | ICD-10-CM | POA: Diagnosis not present

## 2023-04-29 DIAGNOSIS — K5730 Diverticulosis of large intestine without perforation or abscess without bleeding: Secondary | ICD-10-CM | POA: Diagnosis not present

## 2023-04-29 DIAGNOSIS — K625 Hemorrhage of anus and rectum: Secondary | ICD-10-CM | POA: Diagnosis not present

## 2023-04-29 DIAGNOSIS — Z9841 Cataract extraction status, right eye: Secondary | ICD-10-CM

## 2023-04-29 DIAGNOSIS — K5790 Diverticulosis of intestine, part unspecified, without perforation or abscess without bleeding: Secondary | ICD-10-CM | POA: Diagnosis not present

## 2023-04-29 DIAGNOSIS — I2510 Atherosclerotic heart disease of native coronary artery without angina pectoris: Secondary | ICD-10-CM | POA: Diagnosis present

## 2023-04-29 DIAGNOSIS — R062 Wheezing: Secondary | ICD-10-CM | POA: Diagnosis not present

## 2023-04-30 ENCOUNTER — Emergency Department (HOSPITAL_COMMUNITY): Payer: Medicare Other

## 2023-04-30 ENCOUNTER — Observation Stay (HOSPITAL_COMMUNITY): Payer: Medicare Other

## 2023-04-30 ENCOUNTER — Encounter (HOSPITAL_COMMUNITY): Payer: Self-pay

## 2023-04-30 ENCOUNTER — Other Ambulatory Visit: Payer: Self-pay

## 2023-04-30 ENCOUNTER — Inpatient Hospital Stay (HOSPITAL_COMMUNITY)
Admission: EM | Admit: 2023-04-30 | Discharge: 2023-05-02 | DRG: 377 | Disposition: A | Discharge status: Home / Self Care | Payer: Medicare Other | Attending: Internal Medicine | Admitting: Internal Medicine

## 2023-04-30 DIAGNOSIS — K2100 Gastro-esophageal reflux disease with esophagitis, without bleeding: Secondary | ICD-10-CM | POA: Diagnosis present

## 2023-04-30 DIAGNOSIS — I2510 Atherosclerotic heart disease of native coronary artery without angina pectoris: Secondary | ICD-10-CM | POA: Diagnosis not present

## 2023-04-30 DIAGNOSIS — E782 Mixed hyperlipidemia: Secondary | ICD-10-CM | POA: Diagnosis present

## 2023-04-30 DIAGNOSIS — K5790 Diverticulosis of intestine, part unspecified, without perforation or abscess without bleeding: Secondary | ICD-10-CM | POA: Diagnosis not present

## 2023-04-30 DIAGNOSIS — Z7901 Long term (current) use of anticoagulants: Secondary | ICD-10-CM | POA: Diagnosis not present

## 2023-04-30 DIAGNOSIS — K922 Gastrointestinal hemorrhage, unspecified: Secondary | ICD-10-CM | POA: Diagnosis present

## 2023-04-30 DIAGNOSIS — E785 Hyperlipidemia, unspecified: Secondary | ICD-10-CM | POA: Diagnosis present

## 2023-04-30 DIAGNOSIS — N179 Acute kidney failure, unspecified: Secondary | ICD-10-CM | POA: Insufficient documentation

## 2023-04-30 DIAGNOSIS — R0602 Shortness of breath: Secondary | ICD-10-CM | POA: Diagnosis not present

## 2023-04-30 DIAGNOSIS — I10 Essential (primary) hypertension: Secondary | ICD-10-CM | POA: Diagnosis not present

## 2023-04-30 DIAGNOSIS — K625 Hemorrhage of anus and rectum: Secondary | ICD-10-CM | POA: Diagnosis not present

## 2023-04-30 DIAGNOSIS — K5792 Diverticulitis of intestine, part unspecified, without perforation or abscess without bleeding: Secondary | ICD-10-CM | POA: Diagnosis not present

## 2023-04-30 DIAGNOSIS — R062 Wheezing: Secondary | ICD-10-CM | POA: Diagnosis not present

## 2023-04-30 DIAGNOSIS — K5730 Diverticulosis of large intestine without perforation or abscess without bleeding: Secondary | ICD-10-CM | POA: Diagnosis not present

## 2023-04-30 LAB — CBC WITH DIFFERENTIAL/PLATELET
Abs Immature Granulocytes: 0.03 10*3/uL (ref 0.00–0.07)
Abs Immature Granulocytes: 0.03 10*3/uL (ref 0.00–0.07)
Basophils Absolute: 0 10*3/uL (ref 0.0–0.1)
Basophils Absolute: 0 10*3/uL (ref 0.0–0.1)
Basophils Relative: 0 %
Basophils Relative: 0 %
Eosinophils Absolute: 0 10*3/uL (ref 0.0–0.5)
Eosinophils Absolute: 0 10*3/uL (ref 0.0–0.5)
Eosinophils Relative: 0 %
Eosinophils Relative: 0 %
HCT: 36.1 % — ABNORMAL LOW (ref 39.0–52.0)
HCT: 40.9 % (ref 39.0–52.0)
Hemoglobin: 11.8 g/dL — ABNORMAL LOW (ref 13.0–17.0)
Hemoglobin: 13.5 g/dL (ref 13.0–17.0)
Immature Granulocytes: 0 %
Immature Granulocytes: 0 %
Lymphocytes Relative: 7 %
Lymphocytes Relative: 8 %
Lymphs Abs: 0.6 10*3/uL — ABNORMAL LOW (ref 0.7–4.0)
Lymphs Abs: 0.6 10*3/uL — ABNORMAL LOW (ref 0.7–4.0)
MCH: 29.4 pg (ref 26.0–34.0)
MCH: 29.7 pg (ref 26.0–34.0)
MCHC: 32.7 g/dL (ref 30.0–36.0)
MCHC: 33 g/dL (ref 30.0–36.0)
MCV: 89.9 fL (ref 80.0–100.0)
MCV: 90 fL (ref 80.0–100.0)
Monocytes Absolute: 0.3 10*3/uL (ref 0.1–1.0)
Monocytes Absolute: 0.4 10*3/uL (ref 0.1–1.0)
Monocytes Relative: 4 %
Monocytes Relative: 5 %
Neutro Abs: 6.9 10*3/uL (ref 1.7–7.7)
Neutro Abs: 7.3 10*3/uL (ref 1.7–7.7)
Neutrophils Relative %: 88 %
Neutrophils Relative %: 88 %
Platelets: 188 10*3/uL (ref 150–400)
Platelets: 189 10*3/uL (ref 150–400)
RBC: 4.01 MIL/uL — ABNORMAL LOW (ref 4.22–5.81)
RBC: 4.55 MIL/uL (ref 4.22–5.81)
RDW: 13.7 % (ref 11.5–15.5)
RDW: 13.8 % (ref 11.5–15.5)
WBC: 7.8 10*3/uL (ref 4.0–10.5)
WBC: 8.4 10*3/uL (ref 4.0–10.5)
nRBC: 0 % (ref 0.0–0.2)
nRBC: 0 % (ref 0.0–0.2)

## 2023-04-30 LAB — TYPE AND SCREEN
ABO/RH(D): O NEG
Antibody Screen: NEGATIVE

## 2023-04-30 LAB — COMPREHENSIVE METABOLIC PANEL
ALT: 12 U/L (ref 0–44)
ALT: 12 U/L (ref 0–44)
AST: 13 U/L — ABNORMAL LOW (ref 15–41)
AST: 11 U/L — ABNORMAL LOW (ref 15–41)
Albumin: 3.8 g/dL (ref 3.5–5.0)
Albumin: 3.2 g/dL — ABNORMAL LOW (ref 3.5–5.0)
Alkaline Phosphatase: 76 U/L (ref 38–126)
Alkaline Phosphatase: 67 U/L (ref 38–126)
Anion gap: 11 (ref 5–15)
Anion gap: 7 (ref 5–15)
BUN: 26 mg/dL — ABNORMAL HIGH (ref 8–23)
BUN: 25 mg/dL — ABNORMAL HIGH (ref 8–23)
CO2: 22 mmol/L (ref 22–32)
CO2: 24 mmol/L (ref 22–32)
Calcium: 9.1 mg/dL (ref 8.9–10.3)
Calcium: 8.4 mg/dL — ABNORMAL LOW (ref 8.9–10.3)
Chloride: 107 mmol/L (ref 98–111)
Chloride: 106 mmol/L (ref 98–111)
Creatinine, Ser: 1.55 mg/dL — ABNORMAL HIGH (ref 0.61–1.24)
Creatinine, Ser: 1.4 mg/dL — ABNORMAL HIGH (ref 0.61–1.24)
GFR, Estimated: 44 mL/min — ABNORMAL LOW (ref >60)
GFR, Estimated: 49 mL/min — ABNORMAL LOW (ref >60)
Glucose, Bld: 170 mg/dL — ABNORMAL HIGH (ref 70–99)
Glucose, Bld: 146 mg/dL — ABNORMAL HIGH (ref 70–99)
Potassium: 4 mmol/L (ref 3.5–5.1)
Potassium: 4.2 mmol/L (ref 3.5–5.1)
Sodium: 140 mmol/L (ref 135–145)
Sodium: 137 mmol/L (ref 135–145)
Total Bilirubin: 0.9 mg/dL (ref 0.3–1.2)
Total Bilirubin: 0.7 mg/dL (ref 0.3–1.2)
Total Protein: 6.3 g/dL — ABNORMAL LOW (ref 6.5–8.1)
Total Protein: 5.4 g/dL — ABNORMAL LOW (ref 6.5–8.1)

## 2023-04-30 LAB — PROTIME-INR
INR: 1.2 (ref 0.8–1.2)
Prothrombin Time: 15.1 seconds (ref 11.4–15.2)

## 2023-04-30 LAB — HEMOGLOBIN AND HEMATOCRIT, BLOOD
HCT: 36.1 % — ABNORMAL LOW (ref 39.0–52.0)
Hemoglobin: 12 g/dL — ABNORMAL LOW (ref 13.0–17.0)

## 2023-04-30 LAB — MAGNESIUM: Magnesium: 2 mg/dL (ref 1.7–2.4)

## 2023-04-30 MED ORDER — FAMOTIDINE 20 MG PO TABS: 20.0000 mg | ORAL_TABLET | Freq: Every day | ORAL | Status: DC

## 2023-04-30 MED ORDER — DM-GUAIFENESIN ER 30-600 MG PO TB12
1.0000 | ORAL_TABLET | Freq: Two times a day (BID) | ORAL | Status: DC
  Administered 2023-04-30 – 2023-05-02 (×4): 1 via ORAL
  Filled 2023-04-30 (×4): qty 1

## 2023-04-30 MED ORDER — OXYCODONE HCL 5 MG PO TABS: 5.0000 mg | ORAL_TABLET | ORAL | Status: DC | PRN

## 2023-04-30 MED ORDER — SODIUM CHLORIDE 0.9 % IV SOLN: INTRAVENOUS | Status: DC

## 2023-04-30 MED ORDER — ONDANSETRON HCL 4 MG/2ML IJ SOLN: 4.0000 mg | Freq: Four times a day (QID) | INTRAMUSCULAR | Status: DC | PRN

## 2023-04-30 MED ORDER — MORPHINE SULFATE (PF) 2 MG/ML IV SOLN: 2.0000 mg | INTRAVENOUS | Status: DC | PRN

## 2023-04-30 MED ORDER — HYDROXYZINE HCL 25 MG PO TABS
25.0000 mg | ORAL_TABLET | Freq: Three times a day (TID) | ORAL | Status: AC
  Administered 2023-04-30 – 2023-05-01 (×5): 25 mg via ORAL
  Filled 2023-04-30 (×5): qty 1

## 2023-04-30 MED ORDER — ACETAMINOPHEN 650 MG RE SUPP: 650.0000 mg | Freq: Four times a day (QID) | RECTAL | Status: DC | PRN

## 2023-04-30 MED ORDER — METHYLPREDNISOLONE SODIUM SUCC 40 MG IJ SOLR
40.0000 mg | Freq: Two times a day (BID) | INTRAMUSCULAR | Status: DC
  Administered 2023-04-30 – 2023-05-02 (×5): 40 mg via INTRAVENOUS
  Filled 2023-04-30 (×5): qty 1

## 2023-04-30 MED ORDER — LORAZEPAM 2 MG/ML IJ SOLN
0.5000 mg | Freq: Four times a day (QID) | INTRAMUSCULAR | Status: DC | PRN
  Administered 2023-04-30: 0.5 mg via INTRAVENOUS
  Filled 2023-04-30: qty 1

## 2023-04-30 MED ORDER — ACETAMINOPHEN 325 MG PO TABS: 650.0000 mg | ORAL_TABLET | Freq: Four times a day (QID) | ORAL | Status: DC | PRN

## 2023-04-30 MED ORDER — LACTATED RINGERS IV BOLUS
1000.0000 mL | Freq: Once | INTRAVENOUS | Status: AC
  Administered 2023-04-30: 1000 mL via INTRAVENOUS

## 2023-04-30 MED ORDER — PANTOPRAZOLE SODIUM 40 MG IV SOLR
40.0000 mg | Freq: Two times a day (BID) | INTRAVENOUS | Status: DC
  Administered 2023-04-30 – 2023-05-01 (×3): 40 mg via INTRAVENOUS
  Filled 2023-04-30 (×3): qty 10

## 2023-04-30 MED ORDER — IPRATROPIUM-ALBUTEROL 0.5-2.5 (3) MG/3ML IN SOLN
3.0000 mL | RESPIRATORY_TRACT | Status: DC | PRN
  Administered 2023-04-30: 3 mL via RESPIRATORY_TRACT
  Filled 2023-04-30: qty 3

## 2023-04-30 MED ORDER — ALBUTEROL SULFATE (2.5 MG/3ML) 0.083% IN NEBU: 2.5000 mg | INHALATION_SOLUTION | RESPIRATORY_TRACT | Status: DC | PRN

## 2023-04-30 MED ORDER — ATORVASTATIN CALCIUM 40 MG PO TABS
80.0000 mg | ORAL_TABLET | Freq: Every day | ORAL | Status: DC
  Administered 2023-05-01 – 2023-05-02 (×2): 80 mg via ORAL
  Filled 2023-04-30 (×2): qty 2

## 2023-04-30 MED ORDER — IOHEXOL 300 MG/ML  SOLN
100.0000 mL | Freq: Once | INTRAMUSCULAR | Status: AC | PRN
  Administered 2023-04-30: 100 mL via INTRAVENOUS

## 2023-04-30 MED ORDER — ALBUTEROL SULFATE (2.5 MG/3ML) 0.083% IN NEBU
5.0000 mg | INHALATION_SOLUTION | Freq: Once | RESPIRATORY_TRACT | Status: AC
  Administered 2023-04-30: 5 mg via RESPIRATORY_TRACT
  Filled 2023-04-30: qty 6

## 2023-04-30 MED ORDER — ONDANSETRON HCL 4 MG PO TABS: 4.0000 mg | ORAL_TABLET | Freq: Four times a day (QID) | ORAL | Status: DC | PRN

## 2023-04-30 NOTE — Assessment & Plan Note (Signed)
-   Safe to resume the use of Eliquis - Hemoglobin at baseline 13.5 - Hemoglobin at discharge 11.1>>> 11.5 (appears to be associated with hemodilution from fluid resuscitation). -No overt bleeding appreciated -Tolerating diet. - CT shows diverticulosis but no diverticulitis. - Continue the use of Protonix twice daily

## 2023-04-30 NOTE — Consult Note (Cosign Needed Addendum)
Gastroenterology Consult   Referring Provider: No ref. provider found Primary Care Physician:  Jeremy Stabile, MD Primary Gastroenterologist:  Jeremy Frame, MD  Patient ID: Jeremy [name]; [id number]; July 10, 1936   Admit date: 04/30/2023  LOS: 0 days   Date of Consultation: 04/30/2023  Reason for Consultation:  rectal bleeding  History of Present Illness   Jeremy [name] is a 87 y.o. year old male with history of CAD, esophagitis, HLD, HTN, MI, hearing impairment, DVT/PE on Eliquis, and COPD who presented to the ED with complaint of rectal bleeding prior to arrival.  GI consulted for further evaluation   ED Course: Presented with mild hypertension and tachycardia which quickly resolved within a couple hours.  Oxygen saturation 91-94% on room air initially. -Experienced a mild tachycardia after albuterol treatment and developed an episode of shortness of breath requiring Ativan, increase in O2, and attempted CPAP. CT A/P with nonobstructing left nephrolithiasis, colonic diverticulosis without acute diverticulitis.  Stable infrarenal abdominal aorta measuring 3 cm.  Severe atherosclerotic plaque of the aorta and its branches. Initial labs with hemoglobin normal at 13.5, creatinine 1.55, BUN 26. Repeat labs at 5 AM with hemoglobin down to 11.8 with normocytic indices.   Consult: Patient's niece and wife present during exam and helped to provide some history.  Patient states around 11 PM last night he had the urge to have a bowel movement and when he observed his output he found bright red blood as well as some clots.  Patient and wife report this was a fair amount.  He does state that this was similar to an episode he had in 2022.  He did not have any further episodes until around 4 AM while he was in the ED, he went to urinate and had a little small amount of bright red blood in the commode.  When I saw the patient around noon he had not had any further episodes of rectal  bleeding.  He denies any dizziness, lightheadedness, syncope, nausea, vomiting, diarrhea, constipation.  He had a small lack of appetite for the last couple of days however he stated he would like to have eggs and bacon if possible today.  He has been taking Eliquis, his last dose was last night.  He denies any NSAID use.  His wife also reports he has been on prednisone for the last 2 days given his wheezing intermittent shortness of breath at home.  Wife denies any alcohol use.  Patient did report some issues with shortness of breath and anxiety around his difficulty with breathing earlier this morning, was unable to tolerate CPAP very well.  Appears that he did much better after receiving anxiolytic.  He was saturating 92-94% on room air during my exam.   Colonoscopy August 2022: - The examined portion of the ileum was normal.  - Diverticulosis in the sigmoid colon.  - Noncritical stricture in the distal sigmoid colon felt to be secondary to diverticular disease.  - Anal papillae were hypertrophied.  - No specimens collected. - Avoid NSAIDs, start metamucil daily at bedtime.   EGD August 2022: - Normal hypopharynx.  - Normal esophagus.  - Benign- appearing esophageal stenosis. Dilated.  - 2 cm hiatal hernia.  - Normal stomach and normal duodenal bulb and second portion of the duodenum. - Avoid NSAIDs - Repeat EGD as needed   Past Medical History:  Diagnosis Date   Abnormal CXR 4/20011   Chest  CT mode/serere COPD ( no mediastinal abnormality)   CAD (  coronary artery disease) 2005   anterior MI with stent to the LAD in 1991 / stent 1996 / nuclear 2005, no ischemia   Carotid bruit    Doppler, June, 2013, 0-39% bilateral   COPD (chronic obstructive pulmonary disease) (HCC)    Diverticula of colon    Ejection fraction    55-60%, echo, April, 2011, could not estimate RV pressure   Esophagitis    HOH (hard of hearing)    Hyperlipidemia    Hypertension    Myocardial infarction (HCC)     x2   Shortness of breath    with exertion   Sleep apnea    Stop Bang score of 5   Tobacco abuse     Past Surgical History:  Procedure Laterality Date   APPENDECTOMY     BIOPSY  11/17/2017   Procedure: BIOPSY;  Surgeon: Malissa Hippo, MD;  [facility]: [facility];  Service: Endoscopy;;  gastric   CARDIAC CATHETERIZATION     CATARACT EXTRACTION Left    CATARACT EXTRACTION W/PHACO Right 01/22/2014   Procedure: CATARACT EXTRACTION PHACO AND INTRAOCULAR LENS PLACEMENT (IOC);  Surgeon: Gemma Payor, MD;  Location: [facility];  Service: Ophthalmology;  Laterality: Right;  CDE 7.46   CHOLECYSTECTOMY     8 years ago [facility]   COLONOSCOPY  10/05/2012   Dr. Karilyn Cota: multiple diverticula at sigmoid colon, multiple polyps (tubular adenomas), small external hemorrhoids   COLONOSCOPY N/A 11/18/2017   Procedure: COLONOSCOPY;  Surgeon: Malissa Hippo, MD;  [facility]: [facility];  Service: Endoscopy;  Laterality: N/A;   COLONOSCOPY WITH PROPOFOL N/A 05/07/2021   Procedure: COLONOSCOPY WITH PROPOFOL;  Surgeon: Malissa Hippo, MD;  [facility]: [facility];  Service: Endoscopy;  Laterality: N/A;   CYST REMOVAL HAND     right wrist   ESOPHAGEAL DILATION N/A 05/23/2015   Procedure: ESOPHAGEAL DILATION;  Surgeon: Corbin Ade, MD;  [facility]: [facility];  Service: Endoscopy;  Laterality: N/A;   ESOPHAGEAL DILATION N/A 11/17/2017   Procedure: ESOPHAGEAL DILATION;  Surgeon: Malissa Hippo, MD;  [facility]: [facility];  Service: Endoscopy;  Laterality: N/A;   ESOPHAGEAL DILATION N/A 05/07/2021   Procedure: ESOPHAGEAL DILATION;  Surgeon: Malissa Hippo, MD;  [facility]: [facility];  Service: Endoscopy;  Laterality: N/A;   ESOPHAGOGASTRODUODENOSCOPY N/A 05/23/2015   Dr. Jena Gauss: Ulcerative/ erosive reflux esophagitis with peptic stricture formation. Status post dilation as described. Hiatal hernia. Abnormal gastric mucosa of uncertain significance status post gastric biospy. +H.pylori   ESOPHAGOGASTRODUODENOSCOPY  N/A 11/17/2017   Procedure: ESOPHAGOGASTRODUODENOSCOPY (EGD);  Surgeon: Malissa Hippo, MD;  [facility];  Service: Endoscopy;  Laterality: N/A;   ESOPHAGOGASTRODUODENOSCOPY (EGD) WITH PROPOFOL N/A 05/07/2021   Procedure: ESOPHAGOGASTRODUODENOSCOPY (EGD) WITH PROPOFOL;  Surgeon: Malissa Hippo, MD;  [facility]: [facility];  Service: Endoscopy;  Laterality: N/A;   PERCUTANEOUS STENT INTERVENTION     SKIN CANCER EXCISION  2015   ear & neck   TONSILLECTOMY      Prior to Admission medications   Medication Sig Start Date End Date Taking? Authorizing Provider  albuterol (PROVENTIL HFA;VENTOLIN HFA) 108 (90 Base) MCG/ACT inhaler Inhale 1-2 puffs into the lungs every 6 (six) hours as needed for wheezing or shortness of breath.    [provider]  apixaban (ELIQUIS) 5 MG TABS tablet Take 2 tablets (10mg ) twice daily for 7 days, then 1 tablet (5mg ) twice daily 03/19/23   Tyrone Nine, MD  atorvastatin (LIPITOR) 80 MG tablet Take  1 tablet (80 mg total) by mouth daily. 10/12/22   Antoine Poche, MD  benzonatate (TESSALON) 100 MG capsule Take 100 mg by mouth 3 (three) times daily as needed. 03/16/23   [provider]  Cholecalciferol (VITAMIN D3) 50 MCG (2000 UT) CHEW Chew 1 tablet by mouth daily.    [provider]  famotidine (PEPCID) 20 MG tablet One after supper 04/08/23   Nyoka Cowden, MD  formoterol (PERFOROMIST) 20 MCG/2ML nebulizer solution One vial every 12 hours 01/18/23   Nyoka Cowden, MD  ipratropium-albuterol (DUONEB) 0.5-2.5 (3) MG/3ML SOLN Take 3 mLs by nebulization every 4 (four) hours as needed (Shortness of breath). 09/18/21   [provider]  levocetirizine (XYZAL) 5 MG tablet Take 5 mg by mouth every evening.    [provider]  losartan (COZAAR) 100 MG tablet Take 100 mg by mouth daily. 01/02/22   [provider]  nitroGLYCERIN (NITROSTAT) 0.4 MG SL tablet PLACE 1 TABLET UNDER THE TONGUE EVERY 5 MINUTES AS NEEDED FOR  CHEST PAIN. MAX 3 TABLETS PER EVENT. CALL 911 12/24/22   Antoine Poche, MD  pantoprazole (PROTONIX) 40 MG tablet Take 1 tablet (40 mg total) by mouth 2 (two) times daily. 10/12/22   Antoine Poche, MD  predniSONE (DELTASONE) 10 MG tablet Take  4 each am x 2 days,   2 each am x 2 days,  1 each am x 2 days and stop 04/08/23   Nyoka Cowden, MD  revefenacin (YUPELRI) 175 MCG/3ML nebulizer solution Take 3 mLs (175 mcg total) by nebulization daily. 04/08/23   Nyoka Cowden, MD    Current Facility-Administered Medications  Medication Dose Route Frequency Provider Last Rate Last Admin   0.9 %  sodium chloride infusion   Intravenous Continuous Zierle-Ghosh, Asia B, DO 100 mL/hr at 04/30/23 0449 New Bag at 04/30/23 0449   acetaminophen (TYLENOL) tablet 650 mg  650 mg Oral Q6H PRN Zierle-Ghosh, Asia B, DO       Or   acetaminophen (TYLENOL) suppository 650 mg  650 mg Rectal Q6H PRN Zierle-Ghosh, Asia B, DO       albuterol (PROVENTIL) (2.5 MG/3ML) 0.083% nebulizer solution 2.5 mg  2.5 mg Nebulization Q2H PRN Emokpae, Courage, MD       atorvastatin (LIPITOR) tablet 80 mg  80 mg Oral Daily Zierle-Ghosh, Asia B, DO       famotidine (PEPCID) tablet 20 mg  20 mg Oral QHS Zierle-Ghosh, Asia B, DO       hydrOXYzine (ATARAX) tablet 25 mg  25 mg Oral TID Emokpae, Courage, MD       ipratropium-albuterol (DUONEB) 0.5-2.5 (3) MG/3ML nebulizer solution 3 mL  3 mL Nebulization Q4H PRN Zierle-Ghosh, Asia B, DO   3 mL at 04/30/23 0902   LORazepam (ATIVAN) injection 0.5 mg  0.5 mg Intravenous Q6H PRN Mariea Clonts, Courage, MD   0.5 mg at 04/30/23 0928   methylPREDNISolone sodium succinate (SOLU-MEDROL) 40 mg/mL injection 40 mg  40 mg Intravenous Q12H Emokpae, Courage, MD   40 mg at 04/30/23 1012   morphine (PF) 2 MG/ML injection 2 mg  2 mg Intravenous Q2H PRN Zierle-Ghosh, Asia B, DO       ondansetron (ZOFRAN) tablet 4 mg  4 mg Oral Q6H PRN Zierle-Ghosh, Asia B, DO       Or   ondansetron (ZOFRAN) injection 4 mg  4 mg  Intravenous Q6H PRN Zierle-Ghosh, Asia B, DO       oxyCODONE (Oxy  IR/ROXICODONE) immediate release tablet 5 mg  5 mg Oral Q4H PRN Zierle-Ghosh, Asia B, DO       pantoprazole (PROTONIX) injection 40 mg  40 mg Intravenous Q12H Zierle-Ghosh, Asia B, DO   40 mg at 04/30/23 1013   Current Outpatient Medications  Medication Sig Dispense Refill   albuterol (PROVENTIL HFA;VENTOLIN HFA) 108 (90 Base) MCG/ACT inhaler Inhale 1-2 puffs into the lungs every 6 (six) hours as needed for wheezing or shortness of breath.     apixaban (ELIQUIS) 5 MG TABS tablet Take 2 tablets (10mg ) twice daily for 7 days, then 1 tablet (5mg ) twice daily 60 tablet 1   atorvastatin (LIPITOR) 80 MG tablet Take 1 tablet (80 mg total) by mouth daily. 90 tablet 3   benzonatate (TESSALON) 100 MG capsule Take 100 mg by mouth 3 (three) times daily as needed.     Cholecalciferol (VITAMIN D3) 50 MCG (2000 UT) CHEW Chew 1 tablet by mouth daily.     famotidine (PEPCID) 20 MG tablet One after supper 30 tablet 11   formoterol (PERFOROMIST) 20 MCG/2ML nebulizer solution One vial every 12 hours 120 mL 11   ipratropium-albuterol (DUONEB) 0.5-2.5 (3) MG/3ML SOLN Take 3 mLs by nebulization every 4 (four) hours as needed (Shortness of breath).     levocetirizine (XYZAL) 5 MG tablet Take 5 mg by mouth every evening.     losartan (COZAAR) 100 MG tablet Take 100 mg by mouth daily.     nitroGLYCERIN (NITROSTAT) 0.4 MG SL tablet PLACE 1 TABLET UNDER THE TONGUE EVERY 5 MINUTES AS NEEDED FOR CHEST PAIN. MAX 3 TABLETS PER EVENT. CALL 911 25 tablet 3   pantoprazole (PROTONIX) 40 MG tablet Take 1 tablet (40 mg total) by mouth 2 (two) times daily. 180 tablet 3   predniSONE (DELTASONE) 10 MG tablet Take  4 each am x 2 days,   2 each am x 2 days,  1 each am x 2 days and stop 14 tablet 5   revefenacin (YUPELRI) 175 MCG/3ML nebulizer solution Take 3 mLs (175 mcg total) by nebulization daily. 90 mL 11    Allergies as of 04/29/2023   (No Known Allergies)     Family History  Problem Relation Age of Onset   Heart disease Mother    Heart disease Father    Heart disease Brother    Stomach cancer Brother    Colon cancer Neg Hx     Social History   Socioeconomic History   Marital status: Married    Spouse name: Not on file   Number of children: Not on file   Years of education: Not on file   Highest education level: Not on file  Occupational History   Not on file  Tobacco Use   Smoking status: Former    Current packs/day: 0.00    Average packs/day: 3.0 packs/day for 12.0 years (36.0 ttl pk-yrs)    Types: Cigarettes    Start date: 09/15/1983    Quit date: 09/14/1988    Years since quitting: 34.6   Smokeless tobacco: Current    Types: Chew   Tobacco comments:    chews 80% of a pack tobacco per day -- 10/12 doesnt chew too much  Vaping Use   Vaping status: Never Used  Substance and Sexual Activity   Alcohol use: No    Alcohol/week: 0.0 standard drinks of alcohol   Drug use: No   Sexual activity: Yes    Partners: Female  Other Topics Concern  Not on file  Social History Narrative   Not on file   Social Determinants of Health   Financial Resource Strain: Not on file  Food Insecurity: No Food Insecurity (03/17/2023)   Hunger Vital Sign    Worried About Running Out of Food in the Last Year: Never true    Ran Out of Food in the Last Year: Never true  Transportation Needs: No Transportation Needs (03/17/2023)   PRAPARE - Administrator, Civil Service (Medical): No    Lack of Transportation (Non-Medical): No  Physical Activity: Not on file  Stress: Not on file  Social Connections: Not on file  Intimate Partner Violence: Not At Risk (03/17/2023)   Humiliation, Afraid, Rape, and Kick questionnaire    Fear of Current or Ex-Partner: No    Emotionally Abused: No    Physically Abused: No    Sexually Abused: No     Review of Systems   Gen: Denies any fever, chills, loss of appetite, change in weight or weight  loss CV: Denies chest pain, heart palpitations, syncope, edema  Resp: + Shortness of breath and wheezing.  Denies cough, coughing up blood, and pleurisy. GI: See HPI GU : Denies urinary burning, blood in urine, urinary frequency, and urinary incontinence. MS: Denies joint pain, limitation of movement, swelling, cramps, and atrophy.  Derm: Denies rash, itching, dry skin, hives. Psych: Denies depression, anxiety, memory loss, hallucinations, and confusion. Heme: + Rectal bleeding.  Denies bruising. Neuro:  Denies any headaches, dizziness, paresthesias, shaking  Physical Exam   Vital Signs in last 24 hours: Temp:  [98.1 F (36.7 C)-98.5 F (36.9 C)] 98.5 F (36.9 C) (08/16 1036) Pulse Rate:  [65-137] 79 (08/16 1030) Resp:  [19-32] 27 (08/16 1030) BP: (132-169)/(63-103) 132/63 (08/16 1030) SpO2:  [91 %-99 %] 99 % (08/16 1030) FiO2 (%):  [97 %] 97 % (08/16 0947)    General:   Alert.  Cooperative, in no acute distress Head:  Normocephalic and atraumatic. Eyes:  Sclera clear, no icterus.   Conjunctiva pink. Ears: Hard of hearing Mouth:  No deformity or lesions, dentition normal. Neck:  Supple; no masses Lungs:  Clear throughout to auscultation.   No wheezes, crackles, or rhonchi. No acute distress. Heart:  Regular rate and rhythm; no murmurs, clicks, rubs,  or gallops. Abdomen:  Soft, nontender and nondistended. No masses, hepatosplenomegaly or hernias noted. Normal bowel sounds, without guarding, and without rebound.   Rectal: deferred   Msk:  Symmetrical without gross deformities. Normal posture. Extremities:  Without clubbing or edema. Neurologic:  Alert and oriented x4. Skin:  Intact without significant lesions or rashes. Psych:  Alert and cooperative. Normal mood and affect.  Intake/Output from previous day: 08/15 0701 - 08/16 0700 In: 1000 [IV Piggyback:1000] Out: -  Intake/Output this shift:  Labs/Studies   Recent Labs Recent Labs    04/30/23 0046 04/30/23 0450   WBC 8.4 7.8  HGB 13.5 11.8*  HCT 40.9 36.1*  PLT 188 189   BMET Recent Labs    04/30/23 0046 04/30/23 0450  NA 140 137  K 4.0 4.2  CL 107 106  CO2 22 24  GLUCOSE 170* 146*  BUN 26* 25*  CREATININE 1.55* 1.40*  CALCIUM 9.1 8.4*   LFT Recent Labs    04/30/23 0046 04/30/23 0450  PROT 6.3* 5.4*  ALBUMIN 3.8 3.2*  AST 13* 11*  ALT 12 12  ALKPHOS 76 67  BILITOT 0.9 0.7   PT/INR Recent Labs  04/30/23 0156  LABPROT 15.1  INR 1.2   Hepatitis Panel No results for input(s): "HEPBSAG", "HCVAB", "HEPAIGM", "HEPBIGM" in the last 72 hours. C-Diff No results for input(s): "CDIFFTOX" in the last 72 hours.  Radiology/Studies DG Chest Portable 1 View  Result Date: 04/30/2023 CLINICAL DATA:  Increasing shortness of breath with wheezing and tachypnea EXAM: PORTABLE CHEST 1 VIEW COMPARISON:  Chest radiograph dated 03/17/2023 FINDINGS: Normal lung volumes. No focal consolidations. No pleural effusion or pneumothorax. The heart size and mediastinal contours are within normal limits. No acute osseous abnormality. IMPRESSION: No active disease. Electronically Signed   By: Agustin Cree M.D.   On: 04/30/2023 09:56   CT ABDOMEN PELVIS W CONTRAST  Result Date: 04/30/2023 CLINICAL DATA:  Diverticulitis, complication suspected Pt had episode of rectal bleeding tonight when pt went to bathroom. Pt states that it was bright red blood with clots EXAM: CT ABDOMEN AND PELVIS WITH CONTRAST TECHNIQUE: Multidetector CT imaging of the abdomen and pelvis was performed using the standard protocol following bolus administration of intravenous contrast. RADIATION DOSE REDUCTION: This exam was performed according to the departmental dose-optimization program which includes automated exposure control, adjustment of the mA and/or kV according to patient size and/or use of iterative reconstruction technique. CONTRAST:  OMNIPAQUE IOHEXOL 300 MG/ML  SOLN COMPARISON:  CT abdomen pelvis 09/08/2022 FINDINGS:  Lower chest: No acute abnormality. Hepatobiliary: No focal liver abnormality. Status post cholecystectomy. No biliary dilatation. Pancreas: No focal lesion. Normal pancreatic contour. No surrounding inflammatory changes. No main pancreatic ductal dilatation. Spleen: Normal in size without focal abnormality. Adrenals/Urinary Tract: No adrenal nodule bilaterally. Bilateral kidneys enhance symmetrically. Fluid density lesions of the right kidney likely represent simple renal cysts. No hydronephrosis. No hydroureter. Punctate left nephrolithiasis. No right nephrolithiasis. No ureterolithiasis bilaterally. The urinary bladder is unremarkable. Stomach/Bowel: Stomach is within normal limits. No evidence of bowel wall thickening or dilatation. Colonic diverticulosis. Diffuse descending colon and sigmoid colon diverticulosis. Appendix appears normal. Vascular/Lymphatic: Stable infrarenal abdominal aorta measuring up to 3 cm. No iliac aneurysm. Severe atherosclerotic plaque of the aorta and its branches. No abdominal, pelvic, or inguinal lymphadenopathy. Reproductive: Prostate is unremarkable. Other: No intraperitoneal free fluid. No intraperitoneal free gas. No organized fluid collection. Musculoskeletal: No abdominal wall hernia or abnormality. No suspicious lytic or blastic osseous lesions. No acute displaced fracture. Multilevel degenerative changes of the spine. IMPRESSION: 1. Nonobstructive punctate left nephrolithiasis. 2. Colonic diverticulosis with no acute diverticulitis. 3. Stable infrarenal abdominal aorta. Recommend follow-up ultrasound every 3 years. This recommendation follows ACR consensus guidelines: White Paper of the ACR Incidental Findings Committee II on Vascular Findings. J Am Coll Radiol 2013; 91:478-295. 4.  Aortic Atherosclerosis (ICD10-I70.0). 5. No acute intra-intrapelvic abnormality with limited evaluation on this noncontrast study. Electronically Signed   By: Tish Frederickson M.D.   On: 04/30/2023  02:32     Assessment   Jeremy [name] is a 87 y.o. year old male with history of CAD, esophagitis, HLD, HTN, MI, hearing impairment, DVT/PE on Eliquis, and COPD who presented to the ED with complaint of rectal bleeding prior to arrival.  Since being in the ED he has been experiencing some mild tachypnea with shortness of breath which is also being evaluated.  GI consulted for further evaluation of rectal bleeding in the setting of anticoagulation.  Rectal bleeding: Initially presented with normal hemoglobin of 13.5, recheck at 5 AM was 11.8.  Order repeat H&H at noon which revealed hemoglobin of 12.  He experienced large episode of rectal  bleeding with clot around 11 PM last night with an additional small episode of BRBPR around 4 AM in the ED and has had no recurrence since.  Of note he is maintained on Eliquis for history of DVT/PE.  Patient has similar episode in 2022 with colonoscopy that revealed sigmoid diverticulosis therefore diverticular bleed suspected.  For now we will continue to monitor for any ongoing rectal bleeding or drop in hemoglobin.  If significant bleeding occurs then we could consider inpatient colonoscopy, however will defer for now and he can be seen outpatient with close follow-up.  Can hold Eliquis till tomorrow morning if hemoglobin stable and no further rectal bleeding that he can resume.  If patient does develop recurrent rectal bleeding and procedures are needed, his pulmonary status will need to be reevaluated given he remains quite wheezy on exam today is likely experiencing a mild COPD exacerbation, he has been on outpatient steroids for the last 2 days.  Shortness of breath: Patient has had intermittent shortness of breath recently which is anxiety producing.  He was initially placed on oxygen after dropping oxygen saturation and tachypnea.  Attempt made at CPAP which patient states he was unable to tolerate.  He reported improvement after receiving albuterol inhaler as  well as Ativan.  Chest x-ray was unremarkable.  If patient were to undergo any procedures we would have to make sure his respiratory concerns have improved prior to proceeding.  Plan / Recommendations   Trend H/H, transfuse for hemoglobin <7 Hold Eliquis until tomorrow morning, if no further rectal bleeding then he may resume as long as Hgb remains stable Will provide clear liquid diet today, can advance if no recurrent rectal bleeding tomorrow morning Follow-up outpatient in 2-3 weeks.     04/30/2023, 10:40 AM  Brooke Bonito, MSN, FNP-BC, AGACNP-BC [facility]

## 2023-04-30 NOTE — Assessment & Plan Note (Addendum)
-   Protonix twice daily - Continue Pepcid

## 2023-04-30 NOTE — Assessment & Plan Note (Signed)
-   Holding ARB in the setting of AKI - Will add IV as needed medication if needed

## 2023-04-30 NOTE — Assessment & Plan Note (Signed)
Continue statin. 

## 2023-04-30 NOTE — ED Triage Notes (Addendum)
Pt had episode of rectal bleeding tonight when pt went to bathroom. Pt states that it was bright red blood with clots. Pt with hx of same. Pt denies abdominal pain but states that it tight.

## 2023-04-30 NOTE — ED Provider Notes (Signed)
Called to the bedside due to patient having increased work of breathing and wheezing.  Hide diffuse wheezing in all lung fields.  Does appear to be in respiratory distress.  Patient was given nebs and started on BiPAP.  Hospitalist updated.   Rondel Baton, MD 04/30/23 [phone]

## 2023-04-30 NOTE — ED Notes (Signed)
O2 saturation of 89%ra noted. 2lpm [place] applied. Pt denied shortness of breath.

## 2023-04-30 NOTE — ED Notes (Signed)
ED TO INPATIENT HANDOFF REPORT  ED Nurse Name and Phone #: Wandra Mannan, Paramedic [phone]  S Name/Age/Gender Jeremy [name] 87 y.o. male Room/Bed: APA02/APA02  Code Status   Code Status: Full Code  Home/SNF/Other Home Patient oriented to: self, place, time, and situation Is this baseline? Yes   Triage Complete: Triage complete  Chief Complaint GI bleed [K92.2]  Triage Note Pt had episode of rectal bleeding tonight when pt went to bathroom. Pt states that it was bright red blood with clots. Pt with hx of same. Pt denies abdominal pain but states that it tight.    Allergies No Known Allergies  Level of Care/Admitting Diagnosis ED Disposition     ED Disposition  Admit   Condition  --   Comment  Hospital Area: [facility] [[id number]]  Level of Care: Med-Surg [16]  Covid Evaluation: Asymptomatic - no recent exposure (last 10 days) testing not required  Diagnosis: GI bleed [956213]  Admitting Physician: Lilyan Gilford [[id number]]  Attending Physician: Lilyan Gilford [[id number]]          B Medical/Surgery History Past Medical History:  Diagnosis Date   Abnormal CXR 4/20011   Chest  CT mode/serere COPD ( no mediastinal abnormality)   CAD (coronary artery disease) 2005   anterior MI with stent to the LAD in 1991 / stent 1996 / nuclear 2005, no ischemia   Carotid bruit    Doppler, June, 2013, 0-39% bilateral   COPD (chronic obstructive pulmonary disease) (HCC)    Diverticula of colon    Ejection fraction    55-60%, echo, April, 2011, could not estimate RV pressure   Esophagitis    HOH (hard of hearing)    Hyperlipidemia    Hypertension    Myocardial infarction (HCC)    x2   Shortness of breath    with exertion   Sleep apnea    Stop Bang score of 5   Tobacco abuse    Past Surgical History:  Procedure Laterality Date   APPENDECTOMY     BIOPSY  11/17/2017   Procedure: BIOPSY;  Surgeon: Malissa Hippo, MD;  [facility];   Service: Endoscopy;;  gastric   CARDIAC CATHETERIZATION     CATARACT EXTRACTION Left    CATARACT EXTRACTION W/PHACO Right 01/22/2014   Procedure: CATARACT EXTRACTION PHACO AND INTRAOCULAR LENS PLACEMENT (IOC);  Surgeon: Gemma Payor, MD;  Location: [facility];  Service: Ophthalmology;  Laterality: Right;  CDE 7.46   CHOLECYSTECTOMY     8 years ago [facility]   COLONOSCOPY  10/05/2012   Dr. Karilyn Cota: multiple diverticula at sigmoid colon, multiple polyps (tubular adenomas), small external hemorrhoids   COLONOSCOPY N/A 11/18/2017   Procedure: COLONOSCOPY;  Surgeon: Malissa Hippo, MD;  [facility];  Service: Endoscopy;  Laterality: N/A;   COLONOSCOPY WITH PROPOFOL N/A 05/07/2021   Procedure: COLONOSCOPY WITH PROPOFOL;  Surgeon: Malissa Hippo, MD;  [facility];  Service: Endoscopy;  Laterality: N/A;   CYST REMOVAL HAND     right wrist   ESOPHAGEAL DILATION N/A 05/23/2015   Procedure: ESOPHAGEAL DILATION;  Surgeon: Corbin Ade, MD;  [facility];  Service: Endoscopy;  Laterality: N/A;   ESOPHAGEAL DILATION N/A 11/17/2017   Procedure: ESOPHAGEAL DILATION;  Surgeon: Malissa Hippo, MD;  [facility];  Service: Endoscopy;  Laterality: N/A;   ESOPHAGEAL DILATION N/A 05/07/2021   Procedure: ESOPHAGEAL DILATION;  Surgeon: Malissa Hippo, MD;  [facility];  Service: Endoscopy;  Laterality: N/A;   ESOPHAGOGASTRODUODENOSCOPY N/A 05/23/2015   Dr. Jena Gauss: Ulcerative/ erosive reflux esophagitis with peptic stricture formation. Status post dilation as described. Hiatal hernia. Abnormal gastric mucosa of uncertain significance status post gastric biospy. +H.pylori   ESOPHAGOGASTRODUODENOSCOPY N/A 11/17/2017   Procedure: ESOPHAGOGASTRODUODENOSCOPY (EGD);  Surgeon: Malissa Hippo, MD;  [facility];  Service: Endoscopy;  Laterality: N/A;   ESOPHAGOGASTRODUODENOSCOPY (EGD) WITH PROPOFOL N/A 05/07/2021   Procedure: ESOPHAGOGASTRODUODENOSCOPY (EGD) WITH  PROPOFOL;  Surgeon: Malissa Hippo, MD;  [facility];  Service: Endoscopy;  Laterality: N/A;   PERCUTANEOUS STENT INTERVENTION     SKIN CANCER EXCISION  2015   ear & neck   TONSILLECTOMY       A IV Location/Drains/Wounds Patient Lines/Drains/Airways Status     Active Line/Drains/Airways     Name Placement date Placement time Site Days   Peripheral IV 04/30/23 20 G Anterior;Proximal;Right Forearm 04/30/23  0041  Forearm  less than 1            Intake/Output Last 24 hours  Intake/Output Summary (Last 24 hours) at 04/30/2023 1241 Last data filed at 04/30/2023 0429 Gross per 24 hour  Intake 1000 ml  Output --  Net 1000 ml    Labs/Imaging Results for orders placed or performed during the hospital encounter of 04/30/23 (from the past 48 hour(s))  CBC with Differential     Status: Abnormal   Collection Time: 04/30/23 12:46 AM  Result Value Ref Range   WBC 8.4 4.0 - 10.5 K/uL   RBC 4.55 4.22 - 5.81 MIL/uL   Hemoglobin 13.5 13.0 - 17.0 g/dL   HCT 60.4 54.0 - 98.1 %   MCV 89.9 80.0 - 100.0 fL   MCH 29.7 26.0 - 34.0 pg   MCHC 33.0 30.0 - 36.0 g/dL   RDW 19.1 47.8 - 29.5 %   Platelets 188 150 - 400 K/uL   nRBC 0.0 0.0 - 0.2 %   Neutrophils Relative % 88 %   Neutro Abs 7.3 1.7 - 7.7 K/uL   Lymphocytes Relative 7 %   Lymphs Abs 0.6 (L) 0.7 - 4.0 K/uL   Monocytes Relative 5 %   Monocytes Absolute 0.4 0.1 - 1.0 K/uL   Eosinophils Relative 0 %   Eosinophils Absolute 0.0 0.0 - 0.5 K/uL   Basophils Relative 0 %   Basophils Absolute 0.0 0.0 - 0.1 K/uL   Immature Granulocytes 0 %   Abs Immature Granulocytes 0.03 0.00 - 0.07 K/uL    Comment: Performed at [facility], [address] [place], [place] [address]  Comprehensive metabolic panel     Status: Abnormal   Collection Time: 04/30/23 12:46 AM  Result Value Ref Range   Sodium 140 135 - 145 mmol/L   Potassium 4.0 3.5 - 5.1 mmol/L   Chloride 107 98 - 111 mmol/L   CO2 22 22 - 32 mmol/L   Glucose, Bld 170 (H)  70 - 99 mg/dL    Comment: Glucose reference range applies only to samples taken after fasting for at least 8 hours.   BUN 26 (H) 8 - 23 mg/dL   Creatinine, Ser 8.65 (H) 0.61 - 1.24 mg/dL   Calcium 9.1 8.9 - 78.4 mg/dL   Total Protein 6.3 (L) 6.5 - 8.1 g/dL   Albumin 3.8 3.5 - 5.0 g/dL   AST 13 (L) 15 - 41 U/L   ALT 12 0 - 44 U/L   Alkaline Phosphatase 76 38 -  126 U/L   Total Bilirubin 0.9 0.3 - 1.2 mg/dL   GFR, Estimated 44 (L) >60 mL/min    Comment: (NOTE) Calculated using the CKD-EPI Creatinine Equation (2021)    Anion gap 11 5 - 15    Comment: Performed at [facility], [address], [place], [place] [address]  Type and screen     Status: None   Collection Time: 04/30/23 12:46 AM  Result Value Ref Range   ABO/RH(D) NN^NOT NEEDED    Antibody Screen NOT NEEDED    Sample Expiration      05/03/2023,2359 Performed at [facility], [address] [place], [place] [address]   Type and screen Ordered by PROVIDER DEFAULT     Status: None   Collection Time: 04/30/23  1:17 AM  Result Value Ref Range   ABO/RH(D) O NEG    Antibody Screen NEG    Sample Expiration      05/03/2023,2359 Performed at [facility], [address] [place], [place] [address]   Protime-INR     Status: None   Collection Time: 04/30/23  1:56 AM  Result Value Ref Range   Prothrombin Time 15.1 11.4 - 15.2 seconds   INR 1.2 0.8 - 1.2    Comment: (NOTE) INR goal varies based on device and disease states. Performed at [facility], [address] [place] [address], [place], [place] [address]   Comprehensive metabolic panel     Status: Abnormal   Collection Time: 04/30/23  4:50 AM  Result Value Ref Range   Sodium 137 135 - 145 mmol/L   Potassium 4.2 3.5 - 5.1 mmol/L   Chloride 106 98 - 111 mmol/L   CO2 24 22 - 32 mmol/L   Glucose, Bld 146 (H) 70 - 99 mg/dL    Comment: Glucose reference range applies only to samples taken after fasting for at least 8 hours.   BUN 25 (H) 8 - 23 mg/dL   Creatinine, Ser 5.10 (H) 0.61 - 1.24  mg/dL   Calcium 8.4 (L) 8.9 - 10.3 mg/dL   Total Protein 5.4 (L) 6.5 - 8.1 g/dL   Albumin 3.2 (L) 3.5 - 5.0 g/dL   AST 11 (L) 15 - 41 U/L   ALT 12 0 - 44 U/L   Alkaline Phosphatase 67 38 - 126 U/L   Total Bilirubin 0.7 0.3 - 1.2 mg/dL   GFR, Estimated 49 (L) >60 mL/min    Comment: (NOTE) Calculated using the CKD-EPI Creatinine Equation (2021)    Anion gap 7 5 - 15    Comment: Performed at [facility], [address] [place], [place] [address]  CBC with Differential/Platelet     Status: Abnormal   Collection Time: 04/30/23  4:50 AM  Result Value Ref Range   WBC 7.8 4.0 - 10.5 K/uL   RBC 4.01 (L) 4.22 - 5.81 MIL/uL   Hemoglobin 11.8 (L) 13.0 - 17.0 g/dL   HCT 77.8 (L) 24.2 - 35.3 %   MCV 90.0 80.0 - 100.0 fL   MCH 29.4 26.0 - 34.0 pg   MCHC 32.7 30.0 - 36.0 g/dL   RDW 61.4 43.1 - 54.0 %   Platelets 189 150 - 400 K/uL   nRBC 0.0 0.0 - 0.2 %   Neutrophils Relative % 88 %   Neutro Abs 6.9 1.7 - 7.7 K/uL   Lymphocytes Relative 8 %   Lymphs Abs 0.6 (L) 0.7 - 4.0 K/uL   Monocytes Relative 4 %   Monocytes Absolute 0.3 0.1 - 1.0 K/uL  Eosinophils Relative 0 %   Eosinophils Absolute 0.0 0.0 - 0.5 K/uL   Basophils Relative 0 %   Basophils Absolute 0.0 0.0 - 0.1 K/uL   Immature Granulocytes 0 %   Abs Immature Granulocytes 0.03 0.00 - 0.07 K/uL    Comment: Performed at [facility], [address], [place], [place] [address]  Magnesium     Status: None   Collection Time: 04/30/23  4:50 AM  Result Value Ref Range   Magnesium 2.0 1.7 - 2.4 mg/dL    Comment: Performed at [facility], [address], [place], [place] [address]   DG Chest Portable 1 View  Result Date: 04/30/2023 CLINICAL DATA:  Increasing shortness of breath with wheezing and tachypnea EXAM: PORTABLE CHEST 1 VIEW COMPARISON:  Chest radiograph dated 03/17/2023 FINDINGS: Normal lung volumes. No focal consolidations. No pleural effusion or pneumothorax. The heart size and mediastinal contours are within normal limits. No  acute osseous abnormality. IMPRESSION: No active disease. Electronically Signed   By: Agustin Cree M.D.   On: 04/30/2023 09:56   CT ABDOMEN PELVIS W CONTRAST  Result Date: 04/30/2023 CLINICAL DATA:  Diverticulitis, complication suspected Pt had episode of rectal bleeding tonight when pt went to bathroom. Pt states that it was bright red blood with clots EXAM: CT ABDOMEN AND PELVIS WITH CONTRAST TECHNIQUE: Multidetector CT imaging of the abdomen and pelvis was performed using the standard protocol following bolus administration of intravenous contrast. RADIATION DOSE REDUCTION: This exam was performed according to the departmental dose-optimization program which includes automated exposure control, adjustment of the mA and/or kV according to patient size and/or use of iterative reconstruction technique. CONTRAST:  OMNIPAQUE IOHEXOL 300 MG/ML  SOLN COMPARISON:  CT abdomen pelvis 09/08/2022 FINDINGS: Lower chest: No acute abnormality. Hepatobiliary: No focal liver abnormality. Status post cholecystectomy. No biliary dilatation. Pancreas: No focal lesion. Normal pancreatic contour. No surrounding inflammatory changes. No main pancreatic ductal dilatation. Spleen: Normal in size without focal abnormality. Adrenals/Urinary Tract: No adrenal nodule bilaterally. Bilateral kidneys enhance symmetrically. Fluid density lesions of the right kidney likely represent simple renal cysts. No hydronephrosis. No hydroureter. Punctate left nephrolithiasis. No right nephrolithiasis. No ureterolithiasis bilaterally. The urinary bladder is unremarkable. Stomach/Bowel: Stomach is within normal limits. No evidence of bowel wall thickening or dilatation. Colonic diverticulosis. Diffuse descending colon and sigmoid colon diverticulosis. Appendix appears normal. Vascular/Lymphatic: Stable infrarenal abdominal aorta measuring up to 3 cm. No iliac aneurysm. Severe atherosclerotic plaque of the aorta and its branches. No abdominal, pelvic,  or inguinal lymphadenopathy. Reproductive: Prostate is unremarkable. Other: No intraperitoneal free fluid. No intraperitoneal free gas. No organized fluid collection. Musculoskeletal: No abdominal wall hernia or abnormality. No suspicious lytic or blastic osseous lesions. No acute displaced fracture. Multilevel degenerative changes of the spine. IMPRESSION: 1. Nonobstructive punctate left nephrolithiasis. 2. Colonic diverticulosis with no acute diverticulitis. 3. Stable infrarenal abdominal aorta. Recommend follow-up ultrasound every 3 years. This recommendation follows ACR consensus guidelines: White Paper of the ACR Incidental Findings Committee II on Vascular Findings. J Am Coll Radiol 2013; 82:956-213. 4.  Aortic Atherosclerosis (ICD10-I70.0). 5. No acute intra-intrapelvic abnormality with limited evaluation on this noncontrast study. Electronically Signed   By: Tish Frederickson M.D.   On: 04/30/2023 02:32    Pending Labs Unresulted Labs (From admission, onward)     Start     Ordered   04/30/23 1205  Hemoglobin and hematocrit, blood  Once,   R        04/30/23 1204  Vitals/Pain Today's Vitals   04/30/23 0947 04/30/23 1000 04/30/23 1030 04/30/23 1036  BP:  (!) 154/103 132/63   Pulse: (!) 122 (!) 136 79   Resp: (!) 32  (!) 27   Temp:    98.5 F (36.9 C)  TempSrc:    Oral  SpO2: 95% 98% 99%   PainSc:        Isolation Precautions No active isolations  Medications Medications  atorvastatin (LIPITOR) tablet 80 mg (80 mg Oral Patient Refused/Not Given 04/30/23 1016)  famotidine (PEPCID) tablet 20 mg (has no administration in time range)  ipratropium-albuterol (DUONEB) 0.5-2.5 (3) MG/3ML nebulizer solution 3 mL (3 mLs Nebulization Given 04/30/23 0902)  acetaminophen (TYLENOL) tablet 650 mg (has no administration in time range)    Or  acetaminophen (TYLENOL) suppository 650 mg (has no administration in time range)  oxyCODONE (Oxy IR/ROXICODONE) immediate release tablet 5 mg  (has no administration in time range)  0.9 %  sodium chloride infusion ( Intravenous New Bag/Given 04/30/23 0449)  morphine (PF) 2 MG/ML injection 2 mg (has no administration in time range)  ondansetron (ZOFRAN) tablet 4 mg (has no administration in time range)    Or  ondansetron (ZOFRAN) injection 4 mg (has no administration in time range)  pantoprazole (PROTONIX) injection 40 mg (40 mg Intravenous Given 04/30/23 1013)  albuterol (PROVENTIL) (2.5 MG/3ML) 0.083% nebulizer solution 2.5 mg (has no administration in time range)  methylPREDNISolone sodium succinate (SOLU-MEDROL) 40 mg/mL injection 40 mg (40 mg Intravenous Given 04/30/23 1012)  LORazepam (ATIVAN) injection 0.5 mg (0.5 mg Intravenous Given 04/30/23 0928)  hydrOXYzine (ATARAX) tablet 25 mg (25 mg Oral Not Given 04/30/23 1017)  lactated ringers bolus 1,000 mL (0 mLs Intravenous Stopped 04/30/23 0429)  iohexol (OMNIPAQUE) 300 MG/ML solution 100 mL (100 mLs Intravenous Contrast Given 04/30/23 0134)  albuterol (PROVENTIL) (2.5 MG/3ML) 0.083% nebulizer solution 5 mg (5 mg Nebulization Given 04/30/23 0946)    Mobility walks     Focused Assessments Pulmonary Assessment Handoff:  Lung sounds: Bilateral Breath Sounds: Expiratory wheezes O2 Device: Nasal Cannula O2 Flow Rate (L/min): 2 L/min    R Recommendations: See Admitting Provider Note  Report given to:   Additional Notes: 20ga RFA. 165mL/hr NS running. 2lpm via [place], no BiPAP at this time.

## 2023-04-30 NOTE — ED Notes (Signed)
Patients oxygen saturation noted to be 85% while sleeping. Spo2 improved to 93% with 2L [place].

## 2023-04-30 NOTE — ED Notes (Signed)
Patient transported to CT 

## 2023-04-30 NOTE — TOC CM/SW Note (Signed)
Transition of Care [facility]) - Inpatient Brief Assessment   Patient Details  Name: Jeremy [name] MRN: [id number] Date of Birth: Jun 13, 1936  Transition of Care [facility]) CM/SW Contact:    Villa Herb, LCSWA Phone Number: 04/30/2023, 10:36 AM   Clinical Narrative: Transition of Care Department [facility]) has reviewed patient and no TOC needs have been identified at this time. We will continue to monitor patient advancement through interdisciplinary progression rounds. If new patient transition needs arise, please place a TOC consult.  Transition of Care Asessment: Insurance and Status: Insurance coverage has been reviewed Patient has primary care physician: Yes Home environment has been reviewed: from home Prior level of function:: independent Prior/Current Home Services: No current home services Social Determinants of Health Reivew: SDOH reviewed no interventions necessary Readmission risk has been reviewed: Yes Transition of care needs: no transition of care needs at this time

## 2023-04-30 NOTE — Assessment & Plan Note (Signed)
-   Creatinine baseline 1.25 - Creatinine today 1.55 - Hold nephrotoxic agents when possible - Continue IV fluids - Secondary to poor p.o. intake - Continue to monitor

## 2023-04-30 NOTE — H&P (Signed)
History and Physical    Patient: Jeremy [name] RUE:[id number] DOB: Dec 30, 1935 DOA: 04/30/2023 DOS: the patient was seen and examined on 04/30/2023 PCP: Benita Stabile, MD  Patient coming from: Home  Chief Complaint:  Chief Complaint  Patient presents with   Rectal Bleeding   HPI: Jeremy [name] is a 87 y.o. male with medical history significant of hard of hearing, coronary artery disease, esophagitis, hyperlipidemia, hypertension, MI, DVT/PE on Eliquis, and more presents the ED with a chief complaint of rectal bleeding.  Patient and wife both provide history.  Patient reports that he felt like he had to have a bowel movement, and all that came out was blood and blood clots.  There was some melena per wife's report as well.  That happened at 11 PM.  It was a singular bowel movement, and he has not had any more like that since.  Wife reports the patient has had a decreased appetite for 2 weeks.  He has been struggling with COPD exacerbations at home and was recently started on prednisone which has perked him up.  His appetite has not caught up with that yet though.  Patient was started on Eliquis approximately 1-2 months ago for DVT and PE.  Patient denies any chest pain, palpitations, dizziness.  He has not been any more short of breath than normal.  Patient has no other complaints at this time.  Patient no longer smokes.  He does not drink.  Patient is full code. Review of Systems: As mentioned in the history of present illness. All other systems reviewed and are negative. Past Medical History:  Diagnosis Date   Abnormal CXR 4/20011   Chest  CT mode/serere COPD ( no mediastinal abnormality)   CAD (coronary artery disease) 2005   anterior MI with stent to the LAD in 1991 / stent 1996 / nuclear 2005, no ischemia   Carotid bruit    Doppler, June, 2013, 0-39% bilateral   COPD (chronic obstructive pulmonary disease) (HCC)    Diverticula of colon    Ejection fraction    55-60%, echo, April, 2011,  could not estimate RV pressure   Esophagitis    HOH (hard of hearing)    Hyperlipidemia    Hypertension    Myocardial infarction (HCC)    x2   Shortness of breath    with exertion   Sleep apnea    Stop Bang score of 5   Tobacco abuse    Past Surgical History:  Procedure Laterality Date   APPENDECTOMY     BIOPSY  11/17/2017   Procedure: BIOPSY;  Surgeon: Malissa Hippo, MD;  Location: [facility];  Service: Endoscopy;;  gastric   CARDIAC CATHETERIZATION     CATARACT EXTRACTION Left    CATARACT EXTRACTION W/PHACO Right 01/22/2014   Procedure: CATARACT EXTRACTION PHACO AND INTRAOCULAR LENS PLACEMENT (IOC);  Surgeon: Gemma Payor, MD;  Location: [facility];  Service: Ophthalmology;  Laterality: Right;  CDE 7.46   CHOLECYSTECTOMY     8 years ago [facility]   COLONOSCOPY  10/05/2012   Dr. Karilyn Cota: multiple diverticula at sigmoid colon, multiple polyps (tubular adenomas), small external hemorrhoids   COLONOSCOPY N/A 11/18/2017   Procedure: COLONOSCOPY;  Surgeon: Malissa Hippo, MD;  Location: [facility];  Service: Endoscopy;  Laterality: N/A;   COLONOSCOPY WITH PROPOFOL N/A 05/07/2021   Procedure: COLONOSCOPY WITH PROPOFOL;  Surgeon: Malissa Hippo, MD;  Location: [facility];  Service: Endoscopy;  Laterality: N/A;   CYST REMOVAL  HAND     right wrist   ESOPHAGEAL DILATION N/A 05/23/2015   Procedure: ESOPHAGEAL DILATION;  Surgeon: Corbin Ade, MD;  Location: [facility];  Service: Endoscopy;  Laterality: N/A;   ESOPHAGEAL DILATION N/A 11/17/2017   Procedure: ESOPHAGEAL DILATION;  Surgeon: Malissa Hippo, MD;  Location: [facility];  Service: Endoscopy;  Laterality: N/A;   ESOPHAGEAL DILATION N/A 05/07/2021   Procedure: ESOPHAGEAL DILATION;  Surgeon: Malissa Hippo, MD;  [facility]: [facility];  Service: Endoscopy;  Laterality: N/A;   ESOPHAGOGASTRODUODENOSCOPY N/A 05/23/2015   Dr. Jena Gauss: Ulcerative/ erosive reflux esophagitis with peptic stricture formation. Status post dilation as  described. Hiatal hernia. Abnormal gastric mucosa of uncertain significance status post gastric biospy. +H.pylori   ESOPHAGOGASTRODUODENOSCOPY N/A 11/17/2017   Procedure: ESOPHAGOGASTRODUODENOSCOPY (EGD);  Surgeon: Malissa Hippo, MD;  [facility]: [facility];  Service: Endoscopy;  Laterality: N/A;   ESOPHAGOGASTRODUODENOSCOPY (EGD) WITH PROPOFOL N/A 05/07/2021   Procedure: ESOPHAGOGASTRODUODENOSCOPY (EGD) WITH PROPOFOL;  Surgeon: Malissa Hippo, MD;  Location: [facility];  Service: Endoscopy;  Laterality: N/A;   PERCUTANEOUS STENT INTERVENTION     SKIN CANCER EXCISION  2015   ear & neck   TONSILLECTOMY     Social History:  reports that he quit smoking about 34 years ago. His smoking use included cigarettes. He started smoking about 39 years ago. He has a 36 pack-year smoking history. His smokeless tobacco use includes chew. He reports that he does not drink alcohol and does not use drugs.  No Known Allergies  Family History  Problem Relation Age of Onset   Heart disease Mother    Heart disease Father    Heart disease Brother    Stomach cancer Brother    Colon cancer Neg Hx     Prior to Admission medications   Medication Sig Start Date End Date Taking? Authorizing Provider  albuterol (PROVENTIL HFA;VENTOLIN HFA) 108 (90 Base) MCG/ACT inhaler Inhale 1-2 puffs into the lungs every 6 (six) hours as needed for wheezing or shortness of breath.    [provider]  apixaban (ELIQUIS) 5 MG TABS tablet Take 2 tablets (10mg ) twice daily for 7 days, then 1 tablet (5mg ) twice daily 03/19/23   Tyrone Nine, MD  atorvastatin (LIPITOR) 80 MG tablet Take 1 tablet (80 mg total) by mouth daily. 10/12/22   Antoine Poche, MD  benzonatate (TESSALON) 100 MG capsule Take 100 mg by mouth 3 (three) times daily as needed. 03/16/23   [provider]  Cholecalciferol (VITAMIN D3) 50 MCG (2000 UT) CHEW Chew 1 tablet by mouth daily.    [provider]  famotidine (PEPCID) 20 MG tablet  One after supper 04/08/23   Nyoka Cowden, MD  formoterol (PERFOROMIST) 20 MCG/2ML nebulizer solution One vial every 12 hours 01/18/23   Nyoka Cowden, MD  ipratropium-albuterol (DUONEB) 0.5-2.5 (3) MG/3ML SOLN Take 3 mLs by nebulization every 4 (four) hours as needed (Shortness of breath). 09/18/21   [provider]  levocetirizine (XYZAL) 5 MG tablet Take 5 mg by mouth every evening.    [provider]  losartan (COZAAR) 100 MG tablet Take 100 mg by mouth daily. 01/02/22   [provider]  nitroGLYCERIN (NITROSTAT) 0.4 MG SL tablet PLACE 1 TABLET UNDER THE TONGUE EVERY 5 MINUTES AS NEEDED FOR CHEST PAIN. MAX 3 TABLETS PER EVENT. CALL 911 12/24/22   Antoine Poche, MD  pantoprazole (PROTONIX) 40 MG tablet Take 1 tablet (40 mg total)  by mouth 2 (two) times daily. 10/12/22   Antoine Poche, MD  predniSONE (DELTASONE) 10 MG tablet Take  4 each am x 2 days,   2 each am x 2 days,  1 each am x 2 days and stop 04/08/23   Nyoka Cowden, MD  revefenacin (YUPELRI) 175 MCG/3ML nebulizer solution Take 3 mLs (175 mcg total) by nebulization daily. 04/08/23   Nyoka Cowden, MD    Physical Exam: Vitals:   04/30/23 0245 04/30/23 0315 04/30/23 0400 04/30/23 0430  BP: (!) 155/82 135/77 (!) 163/80 (!) 149/77  Pulse: 86 83 75 74  Resp: 19   20  Temp:      TempSrc:      SpO2: 91% 93% 91% 91%   1.  General: Patient lying supine in bed,  no acute distress   2. Psychiatric: Alert and oriented x 3, mood and behavior normal for situation, pleasant and cooperative with exam   3. Neurologic: Speech and language are normal, face is symmetric, moves all 4 extremities voluntarily, at baseline without acute deficits on limited exam   4. HEENMT:  Head is atraumatic, normocephalic, pupils reactive to light, neck is supple, trachea is midline, mucous membranes are moist   5. Respiratory : Barrel chest, lungs are clear to auscultation bilaterally without wheezing, rhonchi, rales, no  cyanosis, no increase in work of breathing or accessory muscle use   6. Cardiovascular : Heart rate normal, rhythm is regular, no murmurs, rubs or gallops, peripheral edema right greater than left, peripheral pulses palpated   7. Gastrointestinal:  Abdomen is soft, nondistended, nontender to palpation bowel sounds active, no masses or organomegaly palpated   8. Skin:  Skin is warm, dry and intact without rashes, acute lesions, or ulcers on limited exam   9.Musculoskeletal:  No acute deformities or trauma, no asymmetry in tone, peripheral edema right greater than left, peripheral pulses palpated, no tenderness to palpation in the extremities  Data Reviewed: In the ED Patient is afebrile, was slightly tachycardic at 106, slightly tachypneic at 29, blood pressure 152/73-169/85, satting 92% on room air No leukocytosis, hemoglobin 13.5 Chemistry mostly normal aside from an elevated creatinine at 1.55 and an elevated glucose 170 CT abdomen shows nonobstructive punctate left nephrolithiasis and diverticulosis without diverticulitis EKG shows sinus tach with a heart rate of 102, QTc 488 Patient was given a liter of fluids Patient was typed and screened Admission was requested for GI bleed  Assessment and Plan: * GI bleed - Hold Eliquis - Hemoglobin at baseline 13.5 - Trend hemoglobin - CT shows diverticulosis but no diverticulitis - Protonix twice daily  AKI (acute kidney injury) (HCC) - Creatinine baseline 1.25 - Creatinine today 1.55 - Hold nephrotoxic agents when possible - Continue IV fluids - Secondary to poor p.o. intake - Continue to monitor  Reflux esophagitis - Protonix twice daily - Continue Pepcid  CAD (coronary artery disease) - Continue statin, holding ARB at this time due to AKI  Hyperlipidemia - Continue statin  Essential hypertension - Holding ARB in the setting of AKI - Will add IV as needed medication if needed      Advance Care Planning:   Code  Status: Full Code  Consults: GI  Family Communication: Wife at bedside  Severity of Illness: The appropriate patient status for this patient is OBSERVATION. Observation status is judged to be reasonable and necessary in order to provide the required intensity of service to ensure the patient's safety. The patient's presenting symptoms, physical  exam findings, and initial radiographic and laboratory data in the context of their medical condition is felt to place them at decreased risk for further clinical deterioration. Furthermore, it is anticipated that the patient will be medically stable for discharge from the hospital within 2 midnights of admission.   Author: Lilyan Gilford, DO 04/30/2023 4:41 AM  For on call review [email or web address].

## 2023-04-30 NOTE — ED Notes (Signed)
Pt taken off BiPAP

## 2023-04-30 NOTE — ED Provider Notes (Signed)
[place] EMERGENCY DEPARTMENT AT [facility] Provider Note   CSN: [id number] Arrival date & time: 04/29/23  2350     History  Chief Complaint  Patient presents with   Rectal Bleeding    Jeremy [name] is a 87 y.o. male.  87 year old male who presents ER today with bright red blood per rectum.  Patient states it happened 1 time earlier tonight.  A decent amount with some clots in it as well.  Only the 1 episode.  He did recently start Eliquis for PE/DVT about a month ago.  Review of the records it appears he did have a diverticular bleed back in 2022.  At that time he was told not to take anti-inflammatories anymore.  Is unclear whether or not he had an ulcer to go along with it although he did have a hiatal hernia and esophageal dilation.   Rectal Bleeding      Home Medications Prior to Admission medications   Medication Sig Start Date End Date Taking? Authorizing Provider  albuterol (PROVENTIL HFA;VENTOLIN HFA) 108 (90 Base) MCG/ACT inhaler Inhale 1-2 puffs into the lungs every 6 (six) hours as needed for wheezing or shortness of breath.    [provider]  apixaban (ELIQUIS) 5 MG TABS tablet Take 2 tablets (10mg ) twice daily for 7 days, then 1 tablet (5mg ) twice daily 03/19/23   Tyrone Nine, MD  atorvastatin (LIPITOR) 80 MG tablet Take 1 tablet (80 mg total) by mouth daily. 10/12/22   Antoine Poche, MD  benzonatate (TESSALON) 100 MG capsule Take 100 mg by mouth 3 (three) times daily as needed. 03/16/23   [provider]  Cholecalciferol (VITAMIN D3) 50 MCG (2000 UT) CHEW Chew 1 tablet by mouth daily.    [provider]  famotidine (PEPCID) 20 MG tablet One after supper 04/08/23   Nyoka Cowden, MD  formoterol (PERFOROMIST) 20 MCG/2ML nebulizer solution One vial every 12 hours 01/18/23   Nyoka Cowden, MD  ipratropium-albuterol (DUONEB) 0.5-2.5 (3) MG/3ML SOLN Take 3 mLs by nebulization every 4 (four) hours as needed (Shortness of breath).  09/18/21   [provider]  levocetirizine (XYZAL) 5 MG tablet Take 5 mg by mouth every evening.    [provider]  losartan (COZAAR) 100 MG tablet Take 100 mg by mouth daily. 01/02/22   [provider]  nitroGLYCERIN (NITROSTAT) 0.4 MG SL tablet PLACE 1 TABLET UNDER THE TONGUE EVERY 5 MINUTES AS NEEDED FOR CHEST PAIN. MAX 3 TABLETS PER EVENT. CALL 911 12/24/22   Antoine Poche, MD  pantoprazole (PROTONIX) 40 MG tablet Take 1 tablet (40 mg total) by mouth 2 (two) times daily. 10/12/22   Antoine Poche, MD  predniSONE (DELTASONE) 10 MG tablet Take  4 each am x 2 days,   2 each am x 2 days,  1 each am x 2 days and stop 04/08/23   Nyoka Cowden, MD  revefenacin (YUPELRI) 175 MCG/3ML nebulizer solution Take 3 mLs (175 mcg total) by nebulization daily. 04/08/23   Nyoka Cowden, MD      Allergies    Patient has no known allergies.    Review of Systems   Review of Systems  Gastrointestinal:  Positive for hematochezia.    Physical Exam Updated Vital Signs BP (!) 152/73   Pulse 83   Temp 98.1 F (36.7 C) (Oral)   Resp (!) 24   SpO2 92%  Physical Exam Vitals and nursing note reviewed.  Constitutional:  Appearance: He is well-developed.  HENT:     Head: Normocephalic and atraumatic.  Cardiovascular:     Rate and Rhythm: Normal rate.  Pulmonary:     Effort: Pulmonary effort is normal. No respiratory distress.  Abdominal:     General: There is no distension.     Tenderness: There is abdominal tenderness.  Musculoskeletal:        General: Normal range of motion.     Cervical back: Normal range of motion.  Neurological:     Mental Status: He is alert.     ED Results / Procedures / Treatments   Labs (all labs ordered are listed, but only abnormal results are displayed) Labs Reviewed  CBC WITH DIFFERENTIAL/PLATELET - Abnormal; Notable for the following components:      Result Value   Lymphs Abs 0.6 (*)    All other components within normal  limits  COMPREHENSIVE METABOLIC PANEL - Abnormal; Notable for the following components:   Glucose, Bld 170 (*)    BUN 26 (*)    Creatinine, Ser 1.55 (*)    Total Protein 6.3 (*)    AST 13 (*)    GFR, Estimated 44 (*)    All other components within normal limits  PROTIME-INR  TYPE AND SCREEN  TYPE AND SCREEN    EKG EKG Interpretation Date/Time:  Friday April 30 2023 00:15:46 EDT Ventricular Rate:  102 PR Interval:  192 QRS Duration:  144 QT Interval:  374 QTC Calculation: 488 R Axis:   -77  Text Interpretation: Sinus tachycardia RBBB and LAFB Confirmed by Marily Memos [phone]) on 04/30/2023 1:27:52 AM  Radiology CT ABDOMEN PELVIS W CONTRAST  Result Date: 04/30/2023 CLINICAL DATA:  Diverticulitis, complication suspected Pt had episode of rectal bleeding tonight when pt went to bathroom. Pt states that it was bright red blood with clots EXAM: CT ABDOMEN AND PELVIS WITH CONTRAST TECHNIQUE: Multidetector CT imaging of the abdomen and pelvis was performed using the standard protocol following bolus administration of intravenous contrast. RADIATION DOSE REDUCTION: This exam was performed according to the departmental dose-optimization program which includes automated exposure control, adjustment of the mA and/or kV according to patient size and/or use of iterative reconstruction technique. CONTRAST:  OMNIPAQUE IOHEXOL 300 MG/ML  SOLN COMPARISON:  CT abdomen pelvis 09/08/2022 FINDINGS: Lower chest: No acute abnormality. Hepatobiliary: No focal liver abnormality. Status post cholecystectomy. No biliary dilatation. Pancreas: No focal lesion. Normal pancreatic contour. No surrounding inflammatory changes. No main pancreatic ductal dilatation. Spleen: Normal in size without focal abnormality. Adrenals/Urinary Tract: No adrenal nodule bilaterally. Bilateral kidneys enhance symmetrically. Fluid density lesions of the right kidney likely represent simple renal cysts. No hydronephrosis. No  hydroureter. Punctate left nephrolithiasis. No right nephrolithiasis. No ureterolithiasis bilaterally. The urinary bladder is unremarkable. Stomach/Bowel: Stomach is within normal limits. No evidence of bowel wall thickening or dilatation. Colonic diverticulosis. Diffuse descending colon and sigmoid colon diverticulosis. Appendix appears normal. Vascular/Lymphatic: Stable infrarenal abdominal aorta measuring up to 3 cm. No iliac aneurysm. Severe atherosclerotic plaque of the aorta and its branches. No abdominal, pelvic, or inguinal lymphadenopathy. Reproductive: Prostate is unremarkable. Other: No intraperitoneal free fluid. No intraperitoneal free gas. No organized fluid collection. Musculoskeletal: No abdominal wall hernia or abnormality. No suspicious lytic or blastic osseous lesions. No acute displaced fracture. Multilevel degenerative changes of the spine. IMPRESSION: 1. Nonobstructive punctate left nephrolithiasis. 2. Colonic diverticulosis with no acute diverticulitis. 3. Stable infrarenal abdominal aorta. Recommend follow-up ultrasound every 3 years. This recommendation follows ACR consensus guidelines:  White Paper of the ACR Incidental Findings Committee II on Vascular Findings. J Am Coll Radiol 2013; 16:109-604. 4.  Aortic Atherosclerosis (ICD10-I70.0). 5. No acute intra-intrapelvic abnormality with limited evaluation on this noncontrast study. Electronically Signed   By: Tish Frederickson M.D.   On: 04/30/2023 02:32    Procedures Procedures    Medications Ordered in ED Medications  lactated ringers bolus 1,000 mL (1,000 mLs Intravenous New Bag/Given 04/30/23 0042)  iohexol (OMNIPAQUE) 300 MG/ML solution 100 mL (100 mLs Intravenous Contrast Given 04/30/23 0134)    ED Course/ Medical Decision Making/ A&P                                 Medical Decision Making Amount and/or Complexity of Data Reviewed Labs: ordered. Radiology: ordered.  Risk Prescription drug management. Decision regarding  hospitalization.  Likely recurrent diverticular bleed.  Will workup for same.  Is on Eliquis for PE/DVT and is currently stable so no indication for emergent reversal at this time.  Secondary to age, Eliquis, tachycardia will likely admit afterwards. Hemoglobin normal. INR pending. CT without abnormality aside from diverticulosis. Mild AKI, fluids given. Will consult [facility] for admission/obs  and possible GI consult if continued bleeding.    Final Clinical Impression(s) / ED Diagnoses Final diagnoses:  Gastrointestinal hemorrhage, unspecified gastrointestinal hemorrhage type    Rx / DC Orders ED Discharge Orders     None         Brycelynn Stampley, Barbara Cower, MD 04/30/23 [phone]

## 2023-04-30 NOTE — Assessment & Plan Note (Signed)
-   Continue statin, holding ARB at this time due to AKI

## 2023-04-30 NOTE — ED Notes (Signed)
Pt called nurse in. C/o increased shob. Pt on side of bed with audible wheezing and tachypnea. Sats around 92 % on 2L. Turned 02 up to 4L and encouraged to breathe slower. Pt slightly anxious, dr Eloise Harman and dr courage aware and orders received. Chest xray, another breathing tx and ativan given.

## 2023-04-30 NOTE — Progress Notes (Signed)
Patient seen and evaluated, chart reviewed, please see EMR for updated orders. Please see full H&P dictated by admitting physician Dr. Camillo Flaming for same date of service.   Brief Summary:- 87 y.o. year old male with history of CAD, esophagitis, HLD, HTN, MI, hearing impairment, DVT/PE on Eliquis, and COPD admitted on 04/30/2023  for concerns for rectal bleeding and found to have acute on chronic hypoxic respiratory failure in the setting of presumed COPD exacerbation   A/p 1)Rectal Bleeding--- GI consult appreciated -Patient had colonoscopy  and EGD back in 04/2021 without significant abnormalities at that time except for diverticulosis, hiatal hernia and benign esophageal stenosis -Recommend holding Eliquis for now -Monitor serial H&H -Liquid diet for now -Defer to GI team timing of any endoluminal evaluation or restart of anticoagulation  2) acute COPD exacerbation--patient with history of advanced emphysema  -patient is a reformed smoker  --IV Solu-Medrol bronchodilators as ordered  3) acute on chronic hypoxic respiratory failure--- oxygen requirement went from a baseline of 2 L by nasal cannula at home to 4 L -Improving with hydration bronchodilators at this time  4) anxiety disorder--contributing to respiratory issues -Be judicious with benzos -Hydroxyzine  as ordered  5) DVT/PE--diagnosed with right lower extremity DVT and PE on 03/17/2023 -Hold Eliquis as above due to GI bleeding consideration -Consider restarting Eliquis on 05/01/2023 if H&H is stable  6)AKI----acute kidney injury  -Creatinine was 1.25 on 04/03/2023 -Patient creatinine is now 1.55, -Creatinine trending down with hydration  renally adjust medications, avoid nephrotoxic agents / dehydration  / hypotension  -Total Care time 51 minutes -Plan of care discussed with patient's wife at bedside, questions answered  Shon Hale, MD

## 2023-05-01 DIAGNOSIS — Z961 Presence of intraocular lens: Secondary | ICD-10-CM | POA: Diagnosis present

## 2023-05-01 DIAGNOSIS — N179 Acute kidney failure, unspecified: Secondary | ICD-10-CM | POA: Diagnosis present

## 2023-05-01 DIAGNOSIS — D62 Acute posthemorrhagic anemia: Secondary | ICD-10-CM | POA: Diagnosis not present

## 2023-05-01 DIAGNOSIS — I10 Essential (primary) hypertension: Secondary | ICD-10-CM | POA: Diagnosis present

## 2023-05-01 DIAGNOSIS — I2510 Atherosclerotic heart disease of native coronary artery without angina pectoris: Secondary | ICD-10-CM | POA: Diagnosis present

## 2023-05-01 DIAGNOSIS — K5790 Diverticulosis of intestine, part unspecified, without perforation or abscess without bleeding: Secondary | ICD-10-CM | POA: Diagnosis not present

## 2023-05-01 DIAGNOSIS — Z8249 Family history of ischemic heart disease and other diseases of the circulatory system: Secondary | ICD-10-CM | POA: Diagnosis not present

## 2023-05-01 DIAGNOSIS — J9621 Acute and chronic respiratory failure with hypoxia: Secondary | ICD-10-CM | POA: Diagnosis not present

## 2023-05-01 DIAGNOSIS — Z7901 Long term (current) use of anticoagulants: Secondary | ICD-10-CM

## 2023-05-01 DIAGNOSIS — K5791 Diverticulosis of intestine, part unspecified, without perforation or abscess with bleeding: Secondary | ICD-10-CM

## 2023-05-01 DIAGNOSIS — Z86718 Personal history of other venous thrombosis and embolism: Secondary | ICD-10-CM | POA: Diagnosis not present

## 2023-05-01 DIAGNOSIS — Z86711 Personal history of pulmonary embolism: Secondary | ICD-10-CM | POA: Diagnosis not present

## 2023-05-01 DIAGNOSIS — Z9841 Cataract extraction status, right eye: Secondary | ICD-10-CM | POA: Diagnosis not present

## 2023-05-01 DIAGNOSIS — F419 Anxiety disorder, unspecified: Secondary | ICD-10-CM | POA: Diagnosis present

## 2023-05-01 DIAGNOSIS — K2100 Gastro-esophageal reflux disease with esophagitis, without bleeding: Secondary | ICD-10-CM | POA: Diagnosis present

## 2023-05-01 DIAGNOSIS — K625 Hemorrhage of anus and rectum: Secondary | ICD-10-CM

## 2023-05-01 DIAGNOSIS — J441 Chronic obstructive pulmonary disease with (acute) exacerbation: Secondary | ICD-10-CM

## 2023-05-01 DIAGNOSIS — Z79899 Other long term (current) drug therapy: Secondary | ICD-10-CM | POA: Diagnosis not present

## 2023-05-01 DIAGNOSIS — K5731 Diverticulosis of large intestine without perforation or abscess with bleeding: Secondary | ICD-10-CM | POA: Diagnosis present

## 2023-05-01 DIAGNOSIS — E785 Hyperlipidemia, unspecified: Secondary | ICD-10-CM | POA: Diagnosis present

## 2023-05-01 DIAGNOSIS — E782 Mixed hyperlipidemia: Secondary | ICD-10-CM | POA: Diagnosis not present

## 2023-05-01 DIAGNOSIS — G4730 Sleep apnea, unspecified: Secondary | ICD-10-CM | POA: Diagnosis present

## 2023-05-01 DIAGNOSIS — Z720 Tobacco use: Secondary | ICD-10-CM | POA: Diagnosis not present

## 2023-05-01 DIAGNOSIS — K922 Gastrointestinal hemorrhage, unspecified: Secondary | ICD-10-CM | POA: Diagnosis present

## 2023-05-01 DIAGNOSIS — Z85828 Personal history of other malignant neoplasm of skin: Secondary | ICD-10-CM | POA: Diagnosis not present

## 2023-05-01 DIAGNOSIS — H9190 Unspecified hearing loss, unspecified ear: Secondary | ICD-10-CM | POA: Diagnosis present

## 2023-05-01 DIAGNOSIS — I252 Old myocardial infarction: Secondary | ICD-10-CM | POA: Diagnosis not present

## 2023-05-01 DIAGNOSIS — J439 Emphysema, unspecified: Secondary | ICD-10-CM | POA: Diagnosis present

## 2023-05-01 DIAGNOSIS — Z9842 Cataract extraction status, left eye: Secondary | ICD-10-CM | POA: Diagnosis not present

## 2023-05-01 LAB — HEMOGLOBIN AND HEMATOCRIT, BLOOD
HCT: 34.2 % — ABNORMAL LOW (ref 39.0–52.0)
Hemoglobin: 11.2 g/dL — ABNORMAL LOW (ref 13.0–17.0)

## 2023-05-01 MED ORDER — REVEFENACIN 175 MCG/3ML IN SOLN
175.0000 ug | Freq: Every day | RESPIRATORY_TRACT | Status: DC
  Administered 2023-05-02: 175 ug via RESPIRATORY_TRACT
  Filled 2023-05-01: qty 3

## 2023-05-01 MED ORDER — PANTOPRAZOLE SODIUM 40 MG PO TBEC
40.0000 mg | DELAYED_RELEASE_TABLET | Freq: Two times a day (BID) | ORAL | Status: DC
  Administered 2023-05-01 – 2023-05-02 (×3): 40 mg via ORAL
  Filled 2023-05-01 (×3): qty 1

## 2023-05-01 MED ORDER — ARFORMOTEROL TARTRATE 15 MCG/2ML IN NEBU
15.0000 ug | INHALATION_SOLUTION | Freq: Two times a day (BID) | RESPIRATORY_TRACT | Status: DC
  Administered 2023-05-01 – 2023-05-02 (×2): 15 ug via RESPIRATORY_TRACT
  Filled 2023-05-01 (×2): qty 2

## 2023-05-01 MED ORDER — APIXABAN 5 MG PO TABS
5.0000 mg | ORAL_TABLET | Freq: Two times a day (BID) | ORAL | Status: DC
  Administered 2023-05-01 – 2023-05-02 (×3): 5 mg via ORAL
  Filled 2023-05-01 (×3): qty 1

## 2023-05-01 NOTE — Progress Notes (Signed)
  Progress Note   Patient: Jeremy [name] NFA:[id number] DOB: 03-Jun-1936 DOA: 04/30/2023     0 DOS: the patient was seen and examined on 05/01/2023   Brief hospital admission narrative : 87 y.o. year old male with history of CAD, esophagitis, HLD, HTN, MI, hearing impairment, DVT/PE on Eliquis, and COPD admitted on 04/30/2023  for concerns for rectal bleeding and found to have acute on chronic hypoxic respiratory failure in the setting of presumed COPD exacerbation   Assessment and Plan: * GI bleed -Resuming Eliquis per GI service recommendation -No overt bleeding currently appreciated -Most likely self-limited diverticular bleed. -No plans for inpatient endoscopic evaluation at the moment. - Overall stable hemoglobin level (11.5); with just mild decrease after receiving fluids (hemodilutional effect). - CT shows diverticulosis but no diverticulitis -Continue Protonix twice daily -Follow hemoglobin trend.  AKI (acute kidney injury) (HCC) - Creatinine baseline 1.25 - Creatinine 1.55 at time of admission -Continue holding nephrotoxic agents as much as possible; follow renal function trend.  -Continue judicious fluid resuscitation.  Reflux esophagitis -Continue PPI and Pepcid.  CAD (coronary artery disease) - Continue statin -Continue holding ARB at this time due to AKI  Hyperlipidemia - Continue statin -Heart healthy diet discussed with patient.  Essential hypertension - Holding ARB in the setting of AKI -Overall stable vital signs -Avoid hypotension.  Acute mild COPD exacerbation -Continue IV Solu-Medrol and bronchodilator management -Patient with chronic history of advanced emphysema.  Chronic respiratory failure -Continue to wean down oxygen supplementation to baseline; patient uses 2 L chronically.  Subjective:  Afebrile, no chest pain, no nausea, no vomiting.  Denies overt bleeding.  Physical Exam: Vitals:   04/30/23 2204 05/01/23 0026 05/01/23 0455 05/01/23 1404   BP: (!) 153/78 (!) 133/57 (!) 144/76 (!) 158/74  Pulse: 67 64 72 91  Resp:  16 16 20   Temp: 98.5 F (36.9 C) 97.9 F (36.6 C) 98 F (36.7 C) 98.2 F (36.8 C)  TempSrc: Oral Oral Oral Oral  SpO2: 100% 100% 96% 97%  Weight:       General exam: Alert, awake, oriented x 3; no chest pain, no nausea, no vomiting, no overt bleeding. Respiratory system: Good air movement bilaterally; no using accessory muscles. Cardiovascular system:RRR. No rubs or gallops; no JVD. Gastrointestinal system: Abdomen is nondistended, soft and nontender. No organomegaly or masses felt. Normal bowel sounds heard. Central nervous system: Alert and oriented. No focal neurological deficits. Extremities: No cyanosis or clubbing. Skin: No rashes, lesions or ulcers Psychiatry: Judgement and insight appear normal. Mood & affect appropriate.   Data Reviewed: H/H: Hemoglobin 11.2 hemotocrit 34.2 Magnesium 2.0  Family Communication: No family at bedside.  Disposition: Status is: Inpatient Remains inpatient appropriate because: Follow hemoglobin trend and transfuse if needed; pending response will require inpatient GI workup.   Planned Discharge Destination: Home   Time spent: 35 minutes  Author: Vassie Loll, MD 05/01/2023 6:30 PM  For on call review [email or web address].

## 2023-05-01 NOTE — Progress Notes (Signed)
Subjective: Patient notes mild amount of dark/black stool today.  Hemoglobin stable.  No abdominal pain.  Objective: Vital signs in last 24 hours: Temp:  [97.6 F (36.4 C)-98.5 F (36.9 C)] 98 F (36.7 C) (08/17 0455) Pulse Rate:  [64-78] 72 (08/17 0455) Resp:  [16-22] 16 (08/17 0455) BP: (133-156)/(57-78) 144/76 (08/17 0455) SpO2:  [93 %-100 %] 96 % (08/17 0455) Weight:  [86.6 kg] 86.6 kg (08/16 1345) Last BM Date : 04/29/23 General:   Alert and oriented, pleasant Head:  Normocephalic and atraumatic. Eyes:  No icterus, sclera clear. Conjuctiva pink.  Abdomen:  Bowel sounds present, soft, non-tender, non-distended. No HSM or hernias noted. No rebound or guarding. No masses appreciated  Msk:  Symmetrical without gross deformities. Normal posture. Extremities:  Without clubbing or edema. Neurologic:  Alert and  oriented x4;  grossly normal neurologically. Skin:  Warm and dry, intact without significant lesions.  Cervical Nodes:  No significant cervical adenopathy. Psych:  Alert and cooperative. Normal mood and affect.  Intake/Output from previous day: 08/16 0701 - 08/17 0700 In: 1653.8 [P.O.:790; I.V.:863.8] Out: -  Intake/Output this shift: Total I/O In: 360 [P.O.:360] Out: -   Lab Results: Recent Labs    04/30/23 0046 04/30/23 0450 04/30/23 1218 05/01/23 0614  WBC 8.4 7.8  --   --   HGB 13.5 11.8* 12.0* 11.2*  HCT 40.9 36.1* 36.1* 34.2*  PLT 188 189  --   --    BMET Recent Labs    04/30/23 0046 04/30/23 0450  NA 140 137  K 4.0 4.2  CL 107 106  CO2 22 24  GLUCOSE 170* 146*  BUN 26* 25*  CREATININE 1.55* 1.40*  CALCIUM 9.1 8.4*   LFT Recent Labs    04/30/23 0046 04/30/23 0450  PROT 6.3* 5.4*  ALBUMIN 3.8 3.2*  AST 13* 11*  ALT 12 12  ALKPHOS 76 67  BILITOT 0.9 0.7   PT/INR Recent Labs    04/30/23 0156  LABPROT 15.1  INR 1.2   Hepatitis Panel No results for input(s): "HEPBSAG", "HCVAB", "HEPAIGM", "HEPBIGM" in the last 72  hours.   Studies/Results: DG Chest Portable 1 View  Result Date: 04/30/2023 CLINICAL DATA:  Increasing shortness of breath with wheezing and tachypnea EXAM: PORTABLE CHEST 1 VIEW COMPARISON:  Chest radiograph dated 03/17/2023 FINDINGS: Normal lung volumes. No focal consolidations. No pleural effusion or pneumothorax. The heart size and mediastinal contours are within normal limits. No acute osseous abnormality. IMPRESSION: No active disease. Electronically Signed   By: Agustin Cree M.D.   On: 04/30/2023 09:56   CT ABDOMEN PELVIS W CONTRAST  Result Date: 04/30/2023 CLINICAL DATA:  Diverticulitis, complication suspected Pt had episode of rectal bleeding tonight when pt went to bathroom. Pt states that it was bright red blood with clots EXAM: CT ABDOMEN AND PELVIS WITH CONTRAST TECHNIQUE: Multidetector CT imaging of the abdomen and pelvis was performed using the standard protocol following bolus administration of intravenous contrast. RADIATION DOSE REDUCTION: This exam was performed according to the departmental dose-optimization program which includes automated exposure control, adjustment of the mA and/or kV according to patient size and/or use of iterative reconstruction technique. CONTRAST:  OMNIPAQUE IOHEXOL 300 MG/ML  SOLN COMPARISON:  CT abdomen pelvis 09/08/2022 FINDINGS: Lower chest: No acute abnormality. Hepatobiliary: No focal liver abnormality. Status post cholecystectomy. No biliary dilatation. Pancreas: No focal lesion. Normal pancreatic contour. No surrounding inflammatory changes. No main pancreatic ductal dilatation. Spleen: Normal in size without focal abnormality. Adrenals/Urinary Tract: No adrenal  nodule bilaterally. Bilateral kidneys enhance symmetrically. Fluid density lesions of the right kidney likely represent simple renal cysts. No hydronephrosis. No hydroureter. Punctate left nephrolithiasis. No right nephrolithiasis. No ureterolithiasis bilaterally. The urinary bladder is  unremarkable. Stomach/Bowel: Stomach is within normal limits. No evidence of bowel wall thickening or dilatation. Colonic diverticulosis. Diffuse descending colon and sigmoid colon diverticulosis. Appendix appears normal. Vascular/Lymphatic: Stable infrarenal abdominal aorta measuring up to 3 cm. No iliac aneurysm. Severe atherosclerotic plaque of the aorta and its branches. No abdominal, pelvic, or inguinal lymphadenopathy. Reproductive: Prostate is unremarkable. Other: No intraperitoneal free fluid. No intraperitoneal free gas. No organized fluid collection. Musculoskeletal: No abdominal wall hernia or abnormality. No suspicious lytic or blastic osseous lesions. No acute displaced fracture. Multilevel degenerative changes of the spine. IMPRESSION: 1. Nonobstructive punctate left nephrolithiasis. 2. Colonic diverticulosis with no acute diverticulitis. 3. Stable infrarenal abdominal aorta. Recommend follow-up ultrasound every 3 years. This recommendation follows ACR consensus guidelines: White Paper of the ACR Incidental Findings Committee II on Vascular Findings. J Am Coll Radiol 2013; 66:440-347. 4.  Aortic Atherosclerosis (ICD10-I70.0). 5. No acute intra-intrapelvic abnormality with limited evaluation on this noncontrast study. Electronically Signed   By: Tish Frederickson M.D.   On: 04/30/2023 02:32    Assessment: *Lower GI bleed *Acute blood loss anemia *Systemic anticoagulation with Eliquis  Plan: Patient's hemoglobin stable.  Endorses mild amount of dark stool, likely washout from previous bleed.  Etiology is likely benign anorectal source such as diverticular, less likely hemorrhoidal.  Relatively recent colonoscopy 2022.  Discussed options with patient and his wife.  Okay to resume his Eliquis today and see how he does.  If hemoglobin remains stable without further bleeding, likely discharge tomorrow.  Will start on diet today.  Jeremy [name]. Marletta Lor, D.O. Gastroenterology and  Hepatology [facility]   LOS: 0 days    05/01/2023, 11:32 AM

## 2023-05-01 NOTE — Plan of Care (Signed)
°  Problem: Education: °Goal: Knowledge of General Education information will improve °Description: Including pain rating scale, medication(s)/side effects and non-pharmacologic comfort measures °Outcome: Progressing °  °Problem: Health Behavior/Discharge Planning: °Goal: Ability to manage health-related needs will improve °Outcome: Progressing °  °Problem: Clinical Measurements: °Goal: Cardiovascular complication will be avoided °Outcome: Progressing °  °

## 2023-05-02 ENCOUNTER — Telehealth: Payer: Self-pay | Admitting: Internal Medicine

## 2023-05-02 DIAGNOSIS — K5791 Diverticulosis of intestine, part unspecified, without perforation or abscess with bleeding: Secondary | ICD-10-CM | POA: Diagnosis not present

## 2023-05-02 DIAGNOSIS — K2100 Gastro-esophageal reflux disease with esophagitis, without bleeding: Secondary | ICD-10-CM | POA: Diagnosis not present

## 2023-05-02 DIAGNOSIS — K5790 Diverticulosis of intestine, part unspecified, without perforation or abscess without bleeding: Secondary | ICD-10-CM | POA: Diagnosis not present

## 2023-05-02 DIAGNOSIS — I10 Essential (primary) hypertension: Secondary | ICD-10-CM | POA: Diagnosis not present

## 2023-05-02 LAB — BASIC METABOLIC PANEL WITH GFR
Anion gap: 7 (ref 5–15)
BUN: 26 mg/dL — ABNORMAL HIGH (ref 8–23)
CO2: 23 mmol/L (ref 22–32)
Calcium: 7.9 mg/dL — ABNORMAL LOW (ref 8.9–10.3)
Chloride: 107 mmol/L (ref 98–111)
Creatinine, Ser: 1.08 mg/dL (ref 0.61–1.24)
GFR, Estimated: >60 mL/min
Glucose, Bld: 127 mg/dL — ABNORMAL HIGH (ref 70–99)
Potassium: 3.8 mmol/L (ref 3.5–5.1)
Sodium: 137 mmol/L (ref 135–145)

## 2023-05-02 LAB — CBC
HCT: 34.5 % — ABNORMAL LOW (ref 39.0–52.0)
Hemoglobin: 11.1 g/dL — ABNORMAL LOW (ref 13.0–17.0)
MCH: 29.1 pg (ref 26.0–34.0)
MCHC: 32.2 g/dL (ref 30.0–36.0)
MCV: 90.3 fL (ref 80.0–100.0)
Platelets: 157 K/uL (ref 150–400)
RBC: 3.82 MIL/uL — ABNORMAL LOW (ref 4.22–5.81)
RDW: 13.5 % (ref 11.5–15.5)
WBC: 6.8 K/uL (ref 4.0–10.5)
nRBC: 0 % (ref 0.0–0.2)

## 2023-05-02 MED ORDER — APIXABAN 5 MG PO TABS: 5.0000 mg | ORAL_TABLET | Freq: Two times a day (BID) | ORAL | Status: DC

## 2023-05-02 MED ORDER — PREDNISONE 20 MG PO TABS: ORAL_TABLET | ORAL | 0 refills | Status: DC

## 2023-05-02 MED ORDER — DM-GUAIFENESIN ER 30-600 MG PO TB12: 1.0000 | ORAL_TABLET | Freq: Two times a day (BID) | ORAL | 0 refills | Status: DC

## 2023-05-02 NOTE — Telephone Encounter (Signed)
Please arrange hospital follow-up visit with app or Dr. Levon Hedger.  Thank you

## 2023-05-02 NOTE — Discharge Summary (Signed)
Physician Discharge Summary   Patient: Jeremy [name] MRN: [id number] DOB: 05-20-1936  Admit date:     04/30/2023  Discharge date: 05/02/23  Discharge Physician: Vassie Loll   PCP: Benita Stabile, MD   Recommendations at discharge:  Repeat CBC to follow hemoglobin trend/stability Repeat basic metabolic panel to follow electrolytes and renal function Reassess blood pressure and further adjust antihypertensive regimen as required.  Discharge Diagnoses: Principal Problem:   GI bleed Active Problems:   Essential hypertension   Hyperlipidemia   CAD (coronary artery disease)   Reflux esophagitis   AKI (acute kidney injury) (HCC)   Diverticulosis   Chronic anticoagulation  Brief hospital admission narrative : 87 y.o. year old male with history of CAD, esophagitis, HLD, HTN, MI, hearing impairment, DVT/PE on Eliquis, and COPD admitted on 04/30/2023  for concerns for rectal bleeding and found to have acute on chronic hypoxic respiratory failure in the setting of presumed COPD exacerbation   Assessment and Plan:  GI bleed - Okay to continue Eliquis per GI service recommendation -No overt bleeding currently appreciated -Most likely self-limited diverticular bleed. -No plans for inpatient endoscopic evaluation at the moment. - Overall stable hemoglobin level (11.5>>11.1); with just mild decrease after receiving fluids (hemodilutional effect). - CT shows diverticulosis but no diverticulitis -Continue Protonix twice daily -Continue to follow hemoglobin trend; repeat CBC at follow-up visit to assess stability. -Patient tolerating diet without any problems.   AKI (acute kidney injury) (HCC) - Creatinine baseline 1.25 - Creatinine 1.55 at time of admission -Continue to maintain adequate hydration -Renal function stabilized and back to his normal. -Safe to resume the use of losartan. -Repeat basic metabolic panel at follow-up visit to assess renal function stability/trend.   Reflux  esophagitis -Continue PPI twice a day and Pepcid nightly.   CAD (coronary artery disease) - Continue statin -Safe to resume the use of losartan -Continue outpatient follow-up with cardiology service.   Hyperlipidemia - Continue statin -Heart healthy diet discussed with patient.   Essential hypertension - Stable overall -Heart healthy diet discussed with patient -Home antihypertensive regimen has been resumed at discharge.   Acute mild COPD exacerbation -Complete treatment with steroid tapering, mucolytic management and resumption of patient's home bronchodilator therapy -Patient with chronic history of advanced emphysema.   Chronic respiratory failure -Patient improved and oxygen saturation within normal range on chronic 2 L supplementation. -Continue patient follow-up with pulmonologist.  Consultants: GI service. Procedures performed: See below for x-ray of ports. Disposition: Home Diet recommendation: Heart healthy diet.  DISCHARGE MEDICATION: Allergies as of 05/02/2023   No Known Allergies      Medication List     STOP taking these medications    doxycycline 100 MG tablet Commonly known as: VIBRA-TABS       TAKE these medications    albuterol 108 (90 Base) MCG/ACT inhaler Commonly known as: VENTOLIN HFA Inhale 1-2 puffs into the lungs every 6 (six) hours as needed for wheezing or shortness of breath.   apixaban 5 MG Tabs tablet Commonly known as: Eliquis Take 1 tablet (5 mg total) by mouth 2 (two) times daily. What changed:  how much to take how to take this when to take this additional instructions   dextromethorphan-guaiFENesin 30-600 MG 12hr tablet Commonly known as: MUCINEX DM Take 1 tablet by mouth 2 (two) times daily.   famotidine 20 MG tablet Commonly known as: Pepcid One after supper   formoterol 20 MCG/2ML nebulizer solution Commonly known as: PERFOROMIST One vial every 12 hours  Icy Hot 7.5 % (Roll) Misc Generic drug: Menthol  (Topical Analgesic) Apply 1 each topically as needed (pain).   ipratropium-albuterol 0.5-2.5 (3) MG/3ML Soln Commonly known as: DUONEB Take 3 mLs by nebulization every 4 (four) hours as needed (Shortness of breath).   levocetirizine 5 MG tablet Commonly known as: XYZAL Take 5 mg by mouth every evening.   losartan 100 MG tablet Commonly known as: COZAAR Take 100 mg by mouth daily.   nitroGLYCERIN 0.4 MG SL tablet Commonly known as: NITROSTAT PLACE 1 TABLET UNDER THE TONGUE EVERY 5 MINUTES AS NEEDED FOR CHEST PAIN. MAX 3 TABLETS PER EVENT. CALL 911   pantoprazole 40 MG tablet Commonly known as: PROTONIX Take 1 tablet (40 mg total) by mouth 2 (two) times daily.   predniSONE 20 MG tablet Commonly known as: DELTASONE Take  3 tabs daily X 1 day; then 2 tablets daily X 2 days; then 1 tablet daily X 3 days; then 1/2 tablet daily X 3 days and stop prednisone. What changed:  medication strength additional instructions   TYLENOL 8 HOUR ARTHRITIS PAIN PO Take 1 tablet by mouth every 8 (eight) hours as needed (pain).   Yupelri 175 MCG/3ML nebulizer solution Generic drug: revefenacin Take 3 mLs (175 mcg total) by nebulization daily.        Discharge Exam: Filed Weights   04/30/23 1345  Weight: 86.6 kg   General exam: Alert, awake, oriented x 3; in no acute distress.  Tolerating diet and without complaints of overt bleeding.  Feeling ready to go home. Respiratory system: Improved air movement bilaterally; no using accessory muscles.  Mild expiratory wheezing appreciated.  Good saturation on chronic 2 L supplementation. Cardiovascular system:RRR. No murmurs, rubs, gallops. Gastrointestinal system: Abdomen is nondistended, soft and nontender. No organomegaly or masses felt. Normal bowel sounds heard. Central nervous system: Alert and oriented. No focal neurological deficits. Extremities: No cyanosis or clubbing. Skin: No petechiae. Psychiatry: Judgement and insight appear normal.  Mood & affect appropriate.    Condition at discharge: good and in a stable condition.  The results of significant diagnostics from this hospitalization (including imaging, microbiology, ancillary and laboratory) are listed below for reference.   Imaging Studies: DG Chest Portable 1 View  Result Date: 04/30/2023 CLINICAL DATA:  Increasing shortness of breath with wheezing and tachypnea EXAM: PORTABLE CHEST 1 VIEW COMPARISON:  Chest radiograph dated 03/17/2023 FINDINGS: Normal lung volumes. No focal consolidations. No pleural effusion or pneumothorax. The heart size and mediastinal contours are within normal limits. No acute osseous abnormality. IMPRESSION: No active disease. Electronically Signed   By: Agustin Cree M.D.   On: 04/30/2023 09:56   CT ABDOMEN PELVIS W CONTRAST  Result Date: 04/30/2023 CLINICAL DATA:  Diverticulitis, complication suspected Pt had episode of rectal bleeding tonight when pt went to bathroom. Pt states that it was bright red blood with clots EXAM: CT ABDOMEN AND PELVIS WITH CONTRAST TECHNIQUE: Multidetector CT imaging of the abdomen and pelvis was performed using the standard protocol following bolus administration of intravenous contrast. RADIATION DOSE REDUCTION: This exam was performed according to the departmental dose-optimization program which includes automated exposure control, adjustment of the mA and/or kV according to patient size and/or use of iterative reconstruction technique. CONTRAST:  OMNIPAQUE IOHEXOL 300 MG/ML  SOLN COMPARISON:  CT abdomen pelvis 09/08/2022 FINDINGS: Lower chest: No acute abnormality. Hepatobiliary: No focal liver abnormality. Status post cholecystectomy. No biliary dilatation. Pancreas: No focal lesion. Normal pancreatic contour. No surrounding inflammatory changes. No main pancreatic ductal  dilatation. Spleen: Normal in size without focal abnormality. Adrenals/Urinary Tract: No adrenal nodule bilaterally. Bilateral kidneys enhance  symmetrically. Fluid density lesions of the right kidney likely represent simple renal cysts. No hydronephrosis. No hydroureter. Punctate left nephrolithiasis. No right nephrolithiasis. No ureterolithiasis bilaterally. The urinary bladder is unremarkable. Stomach/Bowel: Stomach is within normal limits. No evidence of bowel wall thickening or dilatation. Colonic diverticulosis. Diffuse descending colon and sigmoid colon diverticulosis. Appendix appears normal. Vascular/Lymphatic: Stable infrarenal abdominal aorta measuring up to 3 cm. No iliac aneurysm. Severe atherosclerotic plaque of the aorta and its branches. No abdominal, pelvic, or inguinal lymphadenopathy. Reproductive: Prostate is unremarkable. Other: No intraperitoneal free fluid. No intraperitoneal free gas. No organized fluid collection. Musculoskeletal: No abdominal wall hernia or abnormality. No suspicious lytic or blastic osseous lesions. No acute displaced fracture. Multilevel degenerative changes of the spine. IMPRESSION: 1. Nonobstructive punctate left nephrolithiasis. 2. Colonic diverticulosis with no acute diverticulitis. 3. Stable infrarenal abdominal aorta. Recommend follow-up ultrasound every 3 years. This recommendation follows ACR consensus guidelines: White Paper of the ACR Incidental Findings Committee II on Vascular Findings. J Am Coll Radiol 2013; 40:102-725. 4.  Aortic Atherosclerosis (ICD10-I70.0). 5. No acute intra-intrapelvic abnormality with limited evaluation on this noncontrast study. Electronically Signed   By: Tish Frederickson M.D.   On: 04/30/2023 02:32    Microbiology: Results for orders placed or performed during the hospital encounter of 03/17/23  Resp panel by RT-PCR (RSV, Flu A&B, Covid) Anterior Nasal Swab     Status: None   Collection Time: 03/17/23 10:40 AM   Specimen: Anterior Nasal Swab  Result Value Ref Range Status   SARS Coronavirus 2 by RT PCR NEGATIVE NEGATIVE Final    Comment: (NOTE) SARS-CoV-2 target  nucleic acids are NOT DETECTED.  The SARS-CoV-2 RNA is generally detectable in upper respiratory specimens during the acute phase of infection. The lowest concentration of SARS-CoV-2 viral copies this assay can detect is 138 copies/mL. A negative result does not preclude SARS-Cov-2 infection and should not be used as the sole basis for treatment or other patient management decisions. A negative result may occur with  improper specimen collection/handling, submission of specimen other than nasopharyngeal swab, presence of viral mutation(s) within the areas targeted by this assay, and inadequate number of viral copies(<138 copies/mL). A negative result must be combined with clinical observations, patient history, and epidemiological information. The expected result is Negative.  Fact Sheet for Patients:  BloggerCourse.com  Fact Sheet for Healthcare Providers:  SeriousBroker.it  This test is no t yet approved or cleared by the [place] FDA and  has been authorized for detection and/or diagnosis of SARS-CoV-2 by FDA under an Emergency Use Authorization (EUA). This EUA will remain  in effect (meaning this test can be used) for the duration of the COVID-19 declaration under Section 564(b)(1) of the Act, 21 U.S.C.section 360bbb-3(b)(1), unless the authorization is terminated  or revoked sooner.       Influenza A by PCR NEGATIVE NEGATIVE Final   Influenza B by PCR NEGATIVE NEGATIVE Final    Comment: (NOTE) The Xpert Xpress SARS-CoV-2/FLU/RSV plus assay is intended as an aid in the diagnosis of influenza from Nasopharyngeal swab specimens and should not be used as a sole basis for treatment. Nasal washings and aspirates are unacceptable for Xpert Xpress SARS-CoV-2/FLU/RSV testing.  Fact Sheet for Patients: BloggerCourse.com  Fact Sheet for Healthcare  Providers: SeriousBroker.it  This test is not yet approved or cleared by the [place] FDA and has been authorized for detection and/or  diagnosis of SARS-CoV-2 by FDA under an Emergency Use Authorization (EUA). This EUA will remain in effect (meaning this test can be used) for the duration of the COVID-19 declaration under Section 564(b)(1) of the Act, 21 U.S.C. section 360bbb-3(b)(1), unless the authorization is terminated or revoked.     Resp Syncytial Virus by PCR NEGATIVE NEGATIVE Final    Comment: (NOTE) Fact Sheet for Patients: BloggerCourse.com  Fact Sheet for Healthcare Providers: SeriousBroker.it  This test is not yet approved or cleared by the [place] FDA and has been authorized for detection and/or diagnosis of SARS-CoV-2 by FDA under an Emergency Use Authorization (EUA). This EUA will remain in effect (meaning this test can be used) for the duration of the COVID-19 declaration under Section 564(b)(1) of the Act, 21 U.S.C. section 360bbb-3(b)(1), unless the authorization is terminated or revoked.  Performed at [facility], [address], [place], [place] [address]     Labs: CBC: Recent Labs  Lab 04/30/23 0046 04/30/23 0450 04/30/23 1218 05/01/23 0614 05/02/23 0428  WBC 8.4 7.8  --   --  6.8  NEUTROABS 7.3 6.9  --   --   --   HGB 13.5 11.8* 12.0* 11.2* 11.1*  HCT 40.9 36.1* 36.1* 34.2* 34.5*  MCV 89.9 90.0  --   --  90.3  PLT 188 189  --   --  157   Basic Metabolic Panel: Recent Labs  Lab 04/30/23 0046 04/30/23 0450 05/02/23 0428  NA 140 137 137  K 4.0 4.2 3.8  CL 107 106 107  CO2 22 24 23   GLUCOSE 170* 146* 127*  BUN 26* 25* 26*  CREATININE 1.55* 1.40* 1.08  CALCIUM 9.1 8.4* 7.9*  MG  --  2.0  --    Liver Function Tests: Recent Labs  Lab 04/30/23 0046 04/30/23 0450  AST 13* 11*  ALT 12 12  ALKPHOS 76 67  BILITOT 0.9 0.7  PROT 6.3* 5.4*  ALBUMIN 3.8  3.2*   CBG: No results for input(s): "GLUCAP" in the last 168 hours.  Discharge time spent: greater than 30 minutes.  Signed: Vassie Loll, MD [facility] 05/02/2023

## 2023-05-02 NOTE — Progress Notes (Signed)
Dsicharge instructions given pt verbalized understanding. Discharged by Private vehicle. IV removed.

## 2023-05-03 NOTE — Telephone Encounter (Signed)
Apt sch'd 05/31/23 with Dr Tasia Catchings, apt letter mailed to patient

## 2023-05-23 NOTE — Progress Notes (Deleted)
Jeremy [name], male    DOB: [birth year],    MRN: [id number]   Brief patient profile:  50  yowm quit smoking 1990 p MI  referred to pulmonary clinic in [place]  10/13/2021 by Dr Margo Aye for copd eval with Nov 2022 > ER with dx aecopd with GOLD 2 criteria in 2017 but not requiring any maint rx up until Nov 2022     History of Present Illness  10/13/2021  Pulmonary/ 1st office eval/ Jeremy [name] / [name] / on ACEi/breztri  Chief Complaint  Patient presents with   Consult    Patient had covid in 2019. Patient states that he does feel better now. States that he really needed to be seen a month ago but is better now. States that he is able to walk longer distance now than before.   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 Much worse in am's historically but better now  Cough: none but still urge to clear his throat Sleep: bed blocks/ one pillow SABA use: much less now breztri one bid / maybe one albuterol hfa/ neb one each am  Rec Ok to continue lisinopril but it may be part of the problem with your throat (clearing, choking, gagging, something  stuck, problem swallowing)   Plan A = Automatic = Always=    Breztri Take 1-2 puffs first thing in am and then another 2 puffs about 12 hours later.   Work on inhaler technique:   Plan B = Backup (to supplement plan A, not to replace it)Only use your albuterol inhaler as a rescue medication to be used if you can't catch your breath by resting or doing a relaxed purse lip pattern.  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 Cough: min rattling daytime/ no purulent sputum  Sleeping: better at hs on bed blocks  SABA use: alb q am (was supposed to be prn  02: not using  Rec Plan A = Automatic = Always=    performist/yupelri each am and performist 12 hours later  Plan B = Backup (to supplement plan A, not to replace it) Only use your albuterol nebulizer as a rescue medication Plan C = Crisis (instead of Plan B but only if Plan B stops working) Prednisone 10 mg take  4 each am x 2 days,   2 each am x 2 days,  1 each am x 2 days and stop  Ok to use Mucinex dm 1200 mg every 12 hours as needed for cough  Ok to use 02 as needed but goal is to keep the oxygen meter over 90%   Please keep  follow up office  but call sooner if needed  - bring  all medications/ solutions    05/24/2023  f/u ov/[place] office/Jeremy [name] re: GOLD 2 copd  maint on *** did *** bring meds  No chief complaint on file.   Dyspnea:  ***  Cough: *** Sleeping: ***   resp cc  SABA use: *** 02: ***  Lung cancer screening: ***   No obvious day to day or daytime variability or assoc excess/ purulent sputum or mucus plugs or hemoptysis or cp or chest tightness, subjective wheeze or overt sinus or hb symptoms.    Also denies any obvious fluctuation of symptoms with weather or environmental changes or other aggravating or alleviating factors except as outlined above   No unusual exposure hx or h/o childhood pna/ asthma or knowledge of premature birth.  Current Allergies, Complete Past Medical History, Past Surgical History, Family History, and Social History were reviewed in [employer] record.  ROS  The following are not active complaints unless bolded Hoarseness, sore throat, dysphagia, dental problems, itching, sneezing,  nasal congestion or discharge of excess mucus or purulent secretions, ear ache,   fever, chills, sweats, unintended wt loss or wt gain, classically  pleuritic or exertional cp,  orthopnea pnd or arm/hand swelling  or leg swelling, presyncope, palpitations, abdominal pain, anorexia, nausea, vomiting, diarrhea  or change in bowel habits or change in bladder habits, change in stools or change in urine, dysuria, hematuria,  rash, arthralgias, visual complaints, headache, numbness, weakness or ataxia or problems with walking or coordination,  change in mood or  memory.        No outpatient medications have been marked as taking for the 05/24/23 encounter (Appointment) with Nyoka Cowden, MD.           Past Medical History:  Diagnosis Date   Abnormal CXR 4/20011   Chest  CT mode/serere COPD ( no mediastinal abnormality)   CAD (coronary artery disease) 2005   anterior MI with stent to the LAD in 1991 / stent 1996 / nuclear 2005, no ischemia   Carotid bruit    Doppler, June, 2013, 0-39% bilateral   COPD (chronic obstructive pulmonary disease) (HCC)    Diverticula of colon    Ejection fraction    55-60%, echo, April, 2011, could not estimate RV pressure   Esophagitis    HOH (hard of hearing)    Hyperlipidemia    Hypertension    Myocardial infarction (HCC)    x2   Shortness of breath    with exertion   Sleep apnea    Stop Bang score of 5   Tobacco abuse         Objective:    Wts  05/24/2023          ***  04/08/2023       186  02/17/2023         187 01/18/2023         190  11/03/2022       190   08/05/2022     193 02/11/2022       187   12/05/21 185 lb 9.6 oz (84.2 kg)  11/22/21 187 lb (84.8 kg)  10/13/21 187 lb (84.8 kg)    Vital signs reviewed  04/08/2023  - Note at rest 02 sats  91% on RA   General appearance:    amb wm nad    HEENT : Oropharynx  clear   NECK :  without  apparent JVD/ palpable Nodes/TM    LUNGS: no acc muscle use,  Mild barr***      I personally reviewed images and agree with radiology impression as follows:   Chest CTa   03/17/23 1. Study is positive for small emboli in the right lower  lobe pulmonary arterial tree. No massive or submassive PE. 2. Advanced emphysema, diffuse but with upper lung predominance. 3. Extensive coronary artery calcification. 4. These results were called by telephone at the time of interpretation on 03/17/2023 at 12:54 pm to provider [facility] , who verbally acknowledged these results. 1     Assessment

## 2023-05-24 ENCOUNTER — Ambulatory Visit: Payer: Medicare Other | Admitting: Internal Medicine

## 2023-05-24 DIAGNOSIS — Z23 Encounter for immunization: Secondary | ICD-10-CM | POA: Diagnosis not present

## 2023-05-31 ENCOUNTER — Encounter (INDEPENDENT_AMBULATORY_CARE_PROVIDER_SITE_OTHER): Payer: Self-pay

## 2023-05-31 ENCOUNTER — Ambulatory Visit (INDEPENDENT_AMBULATORY_CARE_PROVIDER_SITE_OTHER): Payer: Medicare Other | Admitting: Gastroenterology

## 2023-05-31 ENCOUNTER — Encounter (INDEPENDENT_AMBULATORY_CARE_PROVIDER_SITE_OTHER): Payer: Self-pay | Admitting: Gastroenterology

## 2023-05-31 VITALS — BP 178/103 | HR 103 | Temp 98.1°F | Ht 72.0 in | Wt 180.4 lb

## 2023-05-31 DIAGNOSIS — K625 Hemorrhage of anus and rectum: Secondary | ICD-10-CM

## 2023-05-31 DIAGNOSIS — R634 Abnormal weight loss: Secondary | ICD-10-CM | POA: Insufficient documentation

## 2023-05-31 DIAGNOSIS — I10 Essential (primary) hypertension: Secondary | ICD-10-CM

## 2023-05-31 NOTE — Progress Notes (Signed)
Jeremy [name] , M.D. Gastroenterology & Hepatology [facility] [address] [place], [place] [address] Primary Care Physician: Benita Stabile, MD [address] [place] [address]  Chief Complaint:  painless hematochezia .new complaints of early satiety, loss of appetite and abdominal pain  History of Present Illness: Patient is a 87 year old male with history of CAD, esophagitis, dyslipidemia, DVT/PE on Eliquis, COPD, GERD hematochezia presumed from diverticular bleed is pending to the clinic as a follow-up with new complaints of early satiety, loss of appetite and abdominal pain  And the wife are concerned today as patient has no new onset epigastric pain no relationship with food or defecation no radiation this is accompanied by nausea and loss of appetite.  Patient states that he does not want to eat and has lost 10 pounds since past 1 month  Patient presented a month ago treated in  hospital with blood per rectum baseline hemoglobin 13 dropped to 11 at that time.  Patient was treated conservatively at that time.  BUN/creatinine 26 1 point  Patient has been taking Eliquis daily and since past 1 month no black or stools or hematochezia has been noticed   Endoscopic history 2022 Moderate distal esophageal stricture dilated with balloon dilated to 18 mm. Small sliding hiatal hernia. Normal examination stomach first and second part of duodenum.   Colonoscopy completed with ultraslim scope. Numerous diverticula at sigmoid colon with noncritical stricture at 30 cm from the anal margin traversed with ultraslim scope. No polyps or other abnormalities noted. Anal papillae   Past Medical History: Past Medical History:  Diagnosis Date   Abnormal CXR 4/20011   Chest  CT mode/serere COPD ( no mediastinal abnormality)   CAD (coronary artery disease) 2005   anterior MI with stent to the LAD in 1991 / stent 1996 / nuclear 2005, no  ischemia   Carotid bruit    Doppler, June, 2013, 0-39% bilateral   COPD (chronic obstructive pulmonary disease) (HCC)    Diverticula of colon    Ejection fraction    55-60%, echo, April, 2011, could not estimate RV pressure   Esophagitis    HOH (hard of hearing)    Hyperlipidemia    Hypertension    Myocardial infarction (HCC)    x2   Shortness of breath    with exertion   Sleep apnea    Stop Bang score of 5   Tobacco abuse     Past Surgical History: Past Surgical History:  Procedure Laterality Date   APPENDECTOMY     BIOPSY  11/17/2017   Procedure: BIOPSY;  Surgeon: Malissa Hippo, MD;  [facility];  Service: Endoscopy;;  gastric   CARDIAC CATHETERIZATION     CATARACT EXTRACTION Left    CATARACT EXTRACTION W/PHACO Right 01/22/2014   Procedure: CATARACT EXTRACTION PHACO AND INTRAOCULAR LENS PLACEMENT (IOC);  Surgeon: Gemma Payor, MD;  Location: [facility];  Service: Ophthalmology;  Laterality: Right;  CDE 7.46   CHOLECYSTECTOMY     8 years ago [facility]   COLONOSCOPY  10/05/2012   Dr. Karilyn Cota: multiple diverticula at sigmoid colon, multiple polyps (tubular adenomas), small external hemorrhoids   COLONOSCOPY N/A 11/18/2017   Procedure: COLONOSCOPY;  Surgeon: Malissa Hippo, MD;  [facility];  Service: Endoscopy;  Laterality: N/A;   COLONOSCOPY WITH PROPOFOL N/A 05/07/2021   Procedure: COLONOSCOPY WITH PROPOFOL;  Surgeon: Malissa Hippo, MD;  [facility];  Service: Endoscopy;  Laterality:  N/A;   CYST REMOVAL HAND     right wrist   ESOPHAGEAL DILATION N/A 05/23/2015   Procedure: ESOPHAGEAL DILATION;  Surgeon: Corbin Ade, MD;  [facility];  Service: Endoscopy;  Laterality: N/A;   ESOPHAGEAL DILATION N/A 11/17/2017   Procedure: ESOPHAGEAL DILATION;  Surgeon: Malissa Hippo, MD;  [facility];  Service: Endoscopy;  Laterality: N/A;   ESOPHAGEAL DILATION N/A 05/07/2021   Procedure: ESOPHAGEAL DILATION;  Surgeon: Malissa Hippo, MD;   [facility];  Service: Endoscopy;  Laterality: N/A;   ESOPHAGOGASTRODUODENOSCOPY N/A 05/23/2015   Dr. Jena Gauss: Ulcerative/ erosive reflux esophagitis with peptic stricture formation. Status post dilation as described. Hiatal hernia. Abnormal gastric mucosa of uncertain significance status post gastric biospy. +H.pylori   ESOPHAGOGASTRODUODENOSCOPY N/A 11/17/2017   Procedure: ESOPHAGOGASTRODUODENOSCOPY (EGD);  Surgeon: Malissa Hippo, MD;  [facility];  Service: Endoscopy;  Laterality: N/A;   ESOPHAGOGASTRODUODENOSCOPY (EGD) WITH PROPOFOL N/A 05/07/2021   Procedure: ESOPHAGOGASTRODUODENOSCOPY (EGD) WITH PROPOFOL;  Surgeon: Malissa Hippo, MD;  [facility];  Service: Endoscopy;  Laterality: N/A;   PERCUTANEOUS STENT INTERVENTION     SKIN CANCER EXCISION  2015   ear & neck   TONSILLECTOMY      Family History: Family History  Problem Relation Age of Onset   Heart disease Mother    Heart disease Father    Heart disease Brother    Stomach cancer Brother    Colon cancer Neg Hx     Social History: Social History   Tobacco Use  Smoking Status Former   Current packs/day: 0.00   Average packs/day: 3.0 packs/day for 12.0 years (36.0 ttl pk-yrs)   Types: Cigarettes   Start date: 09/15/1983   Quit date: 09/14/1988   Years since quitting: 34.7  Smokeless Tobacco Current   Types: Chew  Tobacco Comments   chews 80% of a pack tobacco per day -- 10/12 doesnt chew too much   Social History   Substance and Sexual Activity  Alcohol Use No   Alcohol/week: 0.0 standard drinks of alcohol   Social History   Substance and Sexual Activity  Drug Use No    Allergies: No Known Allergies  Medications: Current Outpatient Medications  Medication Sig Dispense Refill   Acetaminophen (TYLENOL 8 HOUR ARTHRITIS PAIN PO) Take 1 tablet by mouth every 8 (eight) hours as needed (pain).     albuterol (PROVENTIL HFA;VENTOLIN HFA) 108 (90 Base) MCG/ACT inhaler Inhale 1-2  puffs into the lungs every 6 (six) hours as needed for wheezing or shortness of breath.     apixaban (ELIQUIS) 5 MG TABS tablet Take 1 tablet (5 mg total) by mouth 2 (two) times daily.     dextromethorphan-guaiFENesin (MUCINEX DM) 30-600 MG 12hr tablet Take 1 tablet by mouth 2 (two) times daily. 20 tablet 0   famotidine (PEPCID) 20 MG tablet One after supper 30 tablet 11   formoterol (PERFOROMIST) 20 MCG/2ML nebulizer solution One vial every 12 hours 120 mL 11   ipratropium-albuterol (DUONEB) 0.5-2.5 (3) MG/3ML SOLN Take 3 mLs by nebulization every 4 (four) hours as needed (Shortness of breath).     levocetirizine (XYZAL) 5 MG tablet Take 5 mg by mouth every evening.     losartan (COZAAR) 100 MG tablet Take 100 mg by mouth daily.     Menthol, Topical Analgesic, (ICY HOT) 7.5 % (Roll) MISC Apply 1 each topically as needed (pain).     nitroGLYCERIN (NITROSTAT) 0.4 MG SL  tablet PLACE 1 TABLET UNDER THE TONGUE EVERY 5 MINUTES AS NEEDED FOR CHEST PAIN. MAX 3 TABLETS PER EVENT. CALL 911 25 tablet 3   pantoprazole (PROTONIX) 40 MG tablet Take 1 tablet (40 mg total) by mouth 2 (two) times daily. 180 tablet 3   predniSONE (DELTASONE) 20 MG tablet Take  3 tabs daily X 1 day; then 2 tablets daily X 2 days; then 1 tablet daily X 3 days; then 1/2 tablet daily X 3 days and stop prednisone. 15 tablet 0   revefenacin (YUPELRI) 175 MCG/3ML nebulizer solution Take 3 mLs (175 mcg total) by nebulization daily. 90 mL 11   No current facility-administered medications for this visit.    Review of Systems: GENERAL: negative for malaise, night sweats HEENT: No changes in hearing or vision, no nose bleeds or other nasal problems. NECK: Negative for lumps, goiter, pain and significant neck swelling RESPIRATORY: Negative for cough, wheezing CARDIOVASCULAR: Negative for chest pain, leg swelling, palpitations, orthopnea GI: SEE HPI MUSCULOSKELETAL: Negative for joint pain or swelling, back pain, and muscle pain. SKIN:  Negative for lesions, rash HEMATOLOGY Negative for prolonged bleeding, bruising easily, and swollen nodes. ENDOCRINE: Negative for cold or heat intolerance, polyuria, polydipsia and goiter. NEURO: negative for tremor, gait imbalance, syncope and seizures. The remainder of the review of systems is noncontributory.   Physical Exam: BP (!) 178/103 (BP Location: Left Arm, Patient Position: Sitting, Cuff Size: Normal)   Pulse (!) 103   Temp 98.1 F (36.7 C) (Oral)   Ht 6' (1.829 m)   Wt 180 lb 6.4 oz (81.8 kg)   BMI 24.47 kg/m  GENERAL: The patient is AO x3, in no acute distress. HEENT: Head is normocephalic and atraumatic. EOMI are intact. Mouth is well hydrated and without lesions. NECK: Supple. No masses LUNGS: Clear to auscultation. No presence of rhonchi/wheezing/rales. Adequate chest expansion HEART: RRR, normal s1 and s2. ABDOMEN: Soft, nontender, no guarding, no peritoneal signs, and nondistended. BS +. No masses. EXTREMITIES: Without any cyanosis, clubbing, rash, lesions or edema. NEUROLOGIC: AOx3, no focal motor deficit. SKIN: no jaundice, no rashes   Imaging/Labs: as above     Latest Ref Rng & Units 05/02/2023    4:28 AM 05/01/2023    6:14 AM 04/30/2023   12:18 PM  CBC  WBC 4.0 - 10.5 K/uL 6.8     Hemoglobin 13.0 - 17.0 g/dL 81.1  91.4  78.2   Hematocrit 39.0 - 52.0 % 34.5  34.2  36.1   Platelets 150 - 400 K/uL 157      Lab Results  Component Value Date   FERRITIN 198 04/19/2019    I personally reviewed and interpreted the available labs, imaging and endoscopic files.  CT abdomen pelvis 04/30/2023  1. Nonobstructive punctate left nephrolithiasis. 2. Colonic diverticulosis with no acute diverticulitis. 3. Stable infrarenal abdominal aorta. Recommend follow-up ultrasound every 3 years. This recommendation follows ACR consensus guidelines: White Paper of the ACR Incidental Findings Committee II on Vascular Findings. J Am Coll Radiol 2013; 95:621-308. 4.  Aortic  Atherosclerosis (ICD10-I70.0). 5. No acute intra-intrapelvic abnormality with limited evaluation on this noncontrast study.  Impression and Plan:  Patient is a 87 year old male with history of CAD, esophagitis, dyslipidemia, DVT/PE on Eliquis, COPD, GERD hematochezia presumed from diverticular bleed is pending to the clinic as a follow-up with new complaints of early satiety, loss of appetite and abdominal pain  #New upper GI symptoms # Satiety/weight loss and abdominal pain  For past 1 month patient has  lost appetite and has documented 10 pound weight loss in past 30 days.  Had a recent CT abdomen with IV contrast with no signs of malignancy and normal pancreas  This could be peptic ulcer disease and less likely upper GI malignancy  No further blood per rectum and patient had blood work for past 1 month hence patient likely had diverticular bleed which was self resolving.  Patient had upper endoscopy 2022 . Moderate distal esophageal stricture dilated with balloon dilated to 18 mm. Normal examination stomach first and second part of duodenum.  He fairly had a recent good quality colonoscopy 22 and less likely of having a lower GI malignancy or lesion  Given continued abdominal pain and loss of appetite reasonable to pursue diagnostic upper endoscopy  1 month since discharge from hospital will check CBC to ensure stability of hemoglobin as patient is on DOAC. Will obtain CMP to evaluate liver enzymes as patient mention "dull"  color stool.  Also obtain TSH  Avoid NSAIDs   #HTN The patient was found to have elevated blood pressure when vital signs were checked in the office. The blood pressure was rechecked by the nursing staff and it was found be persistently elevated >140/90 mmHg. I personally advised to the patient to follow up closely with PCP for hypertension control.   All questions were answered.      Jeremy Lawman, MD Gastroenterology and Hepatology [facility]    This chart has been completed using [facility] Dictation software, and while attempts have been made to ensure accuracy , certain words and phrases may not be transcribed as intended

## 2023-05-31 NOTE — Patient Instructions (Signed)
It was very nice to meet you today, as dicussed with will plan for the following :  1) Upper endoscopy 2) Blood work

## 2023-05-31 NOTE — H&P (View-Only) (Signed)
Jeremy [name] , M.D. Gastroenterology & Hepatology [facility] [address] [place], [place] [address] Primary Care Physician: Benita Stabile, MD [address] [place] [address]  Chief Complaint:  painless hematochezia .new complaints of early satiety, loss of appetite and abdominal pain  History of Present Illness: Patient is a 87 year old male with history of CAD, esophagitis, dyslipidemia, DVT/PE on Eliquis, COPD, GERD hematochezia presumed from diverticular bleed is pending to the clinic as a follow-up with new complaints of early satiety, loss of appetite and abdominal pain  And the wife are concerned today as patient has no new onset epigastric pain no relationship with food or defecation no radiation this is accompanied by nausea and loss of appetite.  Patient states that he does not want to eat and has lost 10 pounds since past 1 month  Patient presented a month ago treated in  hospital with blood per rectum baseline hemoglobin 13 dropped to 11 at that time.  Patient was treated conservatively at that time.  BUN/creatinine 26 1 point  Patient has been taking Eliquis daily and since past 1 month no black or stools or hematochezia has been noticed   Endoscopic history 2022 Moderate distal esophageal stricture dilated with balloon dilated to 18 mm. Small sliding hiatal hernia. Normal examination stomach first and second part of duodenum.   Colonoscopy completed with ultraslim scope. Numerous diverticula at sigmoid colon with noncritical stricture at 30 cm from the anal margin traversed with ultraslim scope. No polyps or other abnormalities noted. Anal papillae   Past Medical History: Past Medical History:  Diagnosis Date   Abnormal CXR 4/20011   Chest  CT mode/serere COPD ( no mediastinal abnormality)   CAD (coronary artery disease) 2005   anterior MI with stent to the LAD in 1991 / stent 1996 / nuclear 2005, no  ischemia   Carotid bruit    Doppler, June, 2013, 0-39% bilateral   COPD (chronic obstructive pulmonary disease) (HCC)    Diverticula of colon    Ejection fraction    55-60%, echo, April, 2011, could not estimate RV pressure   Esophagitis    HOH (hard of hearing)    Hyperlipidemia    Hypertension    Myocardial infarction (HCC)    x2   Shortness of breath    with exertion   Sleep apnea    Stop Bang score of 5   Tobacco abuse     Past Surgical History: Past Surgical History:  Procedure Laterality Date   APPENDECTOMY     BIOPSY  11/17/2017   Procedure: BIOPSY;  Surgeon: Malissa Hippo, MD;  [facility];  Service: Endoscopy;;  gastric   CARDIAC CATHETERIZATION     CATARACT EXTRACTION Left    CATARACT EXTRACTION W/PHACO Right 01/22/2014   Procedure: CATARACT EXTRACTION PHACO AND INTRAOCULAR LENS PLACEMENT (IOC);  Surgeon: Gemma Payor, MD;  Location: [facility];  Service: Ophthalmology;  Laterality: Right;  CDE 7.46   CHOLECYSTECTOMY     8 years ago [facility]   COLONOSCOPY  10/05/2012   Dr. Karilyn Cota: multiple diverticula at sigmoid colon, multiple polyps (tubular adenomas), small external hemorrhoids   COLONOSCOPY N/A 11/18/2017   Procedure: COLONOSCOPY;  Surgeon: Malissa Hippo, MD;  [facility];  Service: Endoscopy;  Laterality: N/A;   COLONOSCOPY WITH PROPOFOL N/A 05/07/2021   Procedure: COLONOSCOPY WITH PROPOFOL;  Surgeon: Malissa Hippo, MD;  [facility];  Service: Endoscopy;  Laterality:  N/A;   CYST REMOVAL HAND     right wrist   ESOPHAGEAL DILATION N/A 05/23/2015   Procedure: ESOPHAGEAL DILATION;  Surgeon: Corbin Ade, MD;  [facility];  Service: Endoscopy;  Laterality: N/A;   ESOPHAGEAL DILATION N/A 11/17/2017   Procedure: ESOPHAGEAL DILATION;  Surgeon: Malissa Hippo, MD;  [facility];  Service: Endoscopy;  Laterality: N/A;   ESOPHAGEAL DILATION N/A 05/07/2021   Procedure: ESOPHAGEAL DILATION;  Surgeon: Malissa Hippo, MD;   [facility];  Service: Endoscopy;  Laterality: N/A;   ESOPHAGOGASTRODUODENOSCOPY N/A 05/23/2015   Dr. Jena Gauss: Ulcerative/ erosive reflux esophagitis with peptic stricture formation. Status post dilation as described. Hiatal hernia. Abnormal gastric mucosa of uncertain significance status post gastric biospy. +H.pylori   ESOPHAGOGASTRODUODENOSCOPY N/A 11/17/2017   Procedure: ESOPHAGOGASTRODUODENOSCOPY (EGD);  Surgeon: Malissa Hippo, MD;  [facility];  Service: Endoscopy;  Laterality: N/A;   ESOPHAGOGASTRODUODENOSCOPY (EGD) WITH PROPOFOL N/A 05/07/2021   Procedure: ESOPHAGOGASTRODUODENOSCOPY (EGD) WITH PROPOFOL;  Surgeon: Malissa Hippo, MD;  [facility];  Service: Endoscopy;  Laterality: N/A;   PERCUTANEOUS STENT INTERVENTION     SKIN CANCER EXCISION  2015   ear & neck   TONSILLECTOMY      Family History: Family History  Problem Relation Age of Onset   Heart disease Mother    Heart disease Father    Heart disease Brother    Stomach cancer Brother    Colon cancer Neg Hx     Social History: Social History   Tobacco Use  Smoking Status Former   Current packs/day: 0.00   Average packs/day: 3.0 packs/day for 12.0 years (36.0 ttl pk-yrs)   Types: Cigarettes   Start date: 09/15/1983   Quit date: 09/14/1988   Years since quitting: 34.7  Smokeless Tobacco Current   Types: Chew  Tobacco Comments   chews 80% of a pack tobacco per day -- 10/12 doesnt chew too much   Social History   Substance and Sexual Activity  Alcohol Use No   Alcohol/week: 0.0 standard drinks of alcohol   Social History   Substance and Sexual Activity  Drug Use No    Allergies: No Known Allergies  Medications: Current Outpatient Medications  Medication Sig Dispense Refill   Acetaminophen (TYLENOL 8 HOUR ARTHRITIS PAIN PO) Take 1 tablet by mouth every 8 (eight) hours as needed (pain).     albuterol (PROVENTIL HFA;VENTOLIN HFA) 108 (90 Base) MCG/ACT inhaler Inhale 1-2  puffs into the lungs every 6 (six) hours as needed for wheezing or shortness of breath.     apixaban (ELIQUIS) 5 MG TABS tablet Take 1 tablet (5 mg total) by mouth 2 (two) times daily.     dextromethorphan-guaiFENesin (MUCINEX DM) 30-600 MG 12hr tablet Take 1 tablet by mouth 2 (two) times daily. 20 tablet 0   famotidine (PEPCID) 20 MG tablet One after supper 30 tablet 11   formoterol (PERFOROMIST) 20 MCG/2ML nebulizer solution One vial every 12 hours 120 mL 11   ipratropium-albuterol (DUONEB) 0.5-2.5 (3) MG/3ML SOLN Take 3 mLs by nebulization every 4 (four) hours as needed (Shortness of breath).     levocetirizine (XYZAL) 5 MG tablet Take 5 mg by mouth every evening.     losartan (COZAAR) 100 MG tablet Take 100 mg by mouth daily.     Menthol, Topical Analgesic, (ICY HOT) 7.5 % (Roll) MISC Apply 1 each topically as needed (pain).     nitroGLYCERIN (NITROSTAT) 0.4 MG SL  tablet PLACE 1 TABLET UNDER THE TONGUE EVERY 5 MINUTES AS NEEDED FOR CHEST PAIN. MAX 3 TABLETS PER EVENT. CALL 911 25 tablet 3   pantoprazole (PROTONIX) 40 MG tablet Take 1 tablet (40 mg total) by mouth 2 (two) times daily. 180 tablet 3   predniSONE (DELTASONE) 20 MG tablet Take  3 tabs daily X 1 day; then 2 tablets daily X 2 days; then 1 tablet daily X 3 days; then 1/2 tablet daily X 3 days and stop prednisone. 15 tablet 0   revefenacin (YUPELRI) 175 MCG/3ML nebulizer solution Take 3 mLs (175 mcg total) by nebulization daily. 90 mL 11   No current facility-administered medications for this visit.    Review of Systems: GENERAL: negative for malaise, night sweats HEENT: No changes in hearing or vision, no nose bleeds or other nasal problems. NECK: Negative for lumps, goiter, pain and significant neck swelling RESPIRATORY: Negative for cough, wheezing CARDIOVASCULAR: Negative for chest pain, leg swelling, palpitations, orthopnea GI: SEE HPI MUSCULOSKELETAL: Negative for joint pain or swelling, back pain, and muscle pain. SKIN:  Negative for lesions, rash HEMATOLOGY Negative for prolonged bleeding, bruising easily, and swollen nodes. ENDOCRINE: Negative for cold or heat intolerance, polyuria, polydipsia and goiter. NEURO: negative for tremor, gait imbalance, syncope and seizures. The remainder of the review of systems is noncontributory.   Physical Exam: BP (!) 178/103 (BP Location: Left Arm, Patient Position: Sitting, Cuff Size: Normal)   Pulse (!) 103   Temp 98.1 F (36.7 C) (Oral)   Ht 6' (1.829 m)   Wt 180 lb 6.4 oz (81.8 kg)   BMI 24.47 kg/m  GENERAL: The patient is AO x3, in no acute distress. HEENT: Head is normocephalic and atraumatic. EOMI are intact. Mouth is well hydrated and without lesions. NECK: Supple. No masses LUNGS: Clear to auscultation. No presence of rhonchi/wheezing/rales. Adequate chest expansion HEART: RRR, normal s1 and s2. ABDOMEN: Soft, nontender, no guarding, no peritoneal signs, and nondistended. BS +. No masses. EXTREMITIES: Without any cyanosis, clubbing, rash, lesions or edema. NEUROLOGIC: AOx3, no focal motor deficit. SKIN: no jaundice, no rashes   Imaging/Labs: as above     Latest Ref Rng & Units 05/02/2023    4:28 AM 05/01/2023    6:14 AM 04/30/2023   12:18 PM  CBC  WBC 4.0 - 10.5 K/uL 6.8     Hemoglobin 13.0 - 17.0 g/dL 81.1  91.4  78.2   Hematocrit 39.0 - 52.0 % 34.5  34.2  36.1   Platelets 150 - 400 K/uL 157      Lab Results  Component Value Date   FERRITIN 198 04/19/2019    I personally reviewed and interpreted the available labs, imaging and endoscopic files.  CT abdomen pelvis 04/30/2023  1. Nonobstructive punctate left nephrolithiasis. 2. Colonic diverticulosis with no acute diverticulitis. 3. Stable infrarenal abdominal aorta. Recommend follow-up ultrasound every 3 years. This recommendation follows ACR consensus guidelines: White Paper of the ACR Incidental Findings Committee II on Vascular Findings. J Am Coll Radiol 2013; 95:621-308. 4.  Aortic  Atherosclerosis (ICD10-I70.0). 5. No acute intra-intrapelvic abnormality with limited evaluation on this noncontrast study.  Impression and Plan:  Patient is a 87 year old male with history of CAD, esophagitis, dyslipidemia, DVT/PE on Eliquis, COPD, GERD hematochezia presumed from diverticular bleed is pending to the clinic as a follow-up with new complaints of early satiety, loss of appetite and abdominal pain  #New upper GI symptoms # Satiety/weight loss and abdominal pain  For past 1 month patient has  lost appetite and has documented 10 pound weight loss in past 30 days.  Had a recent CT abdomen with IV contrast with no signs of malignancy and normal pancreas  This could be peptic ulcer disease and less likely upper GI malignancy  No further blood per rectum and patient had blood work for past 1 month hence patient likely had diverticular bleed which was self resolving.  Patient had upper endoscopy 2022 . Moderate distal esophageal stricture dilated with balloon dilated to 18 mm. Normal examination stomach first and second part of duodenum.  He fairly had a recent good quality colonoscopy 22 and less likely of having a lower GI malignancy or lesion  Given continued abdominal pain and loss of appetite reasonable to pursue diagnostic upper endoscopy  1 month since discharge from hospital will check CBC to ensure stability of hemoglobin as patient is on DOAC. Will obtain CMP to evaluate liver enzymes as patient mention "dull"  color stool.  Also obtain TSH  Avoid NSAIDs   #HTN The patient was found to have elevated blood pressure when vital signs were checked in the office. The blood pressure was rechecked by the nursing staff and it was found be persistently elevated >140/90 mmHg. I personally advised to the patient to follow up closely with PCP for hypertension control.   All questions were answered.      Jeremy Lawman, MD Gastroenterology and Hepatology [facility]    This chart has been completed using [facility] Dictation software, and while attempts have been made to ensure accuracy , certain words and phrases may not be transcribed as intended

## 2023-06-01 LAB — COMPREHENSIVE METABOLIC PANEL
ALT: 9 IU/L (ref 0–44)
AST: 10 IU/L (ref 0–40)
Albumin: 3.9 g/dL (ref 3.7–4.7)
Alkaline Phosphatase: 93 IU/L (ref 44–121)
BUN/Creatinine Ratio: 9 — ABNORMAL LOW (ref 10–24)
BUN: 11 mg/dL (ref 8–27)
Bilirubin Total: 0.8 mg/dL (ref 0.0–1.2)
CO2: 22 mmol/L (ref 20–29)
Calcium: 8.8 mg/dL (ref 8.6–10.2)
Chloride: 106 mmol/L (ref 96–106)
Creatinine, Ser: 1.23 mg/dL (ref 0.76–1.27)
Globulin, Total: 1.8 g/dL (ref 1.5–4.5)
Glucose: 82 mg/dL (ref 70–99)
Potassium: 3.5 mmol/L (ref 3.5–5.2)
Sodium: 146 mmol/L — ABNORMAL HIGH (ref 134–144)
Total Protein: 5.7 g/dL — ABNORMAL LOW (ref 6.0–8.5)
eGFR: 58 mL/min/{1.73_m2} — ABNORMAL LOW (ref >59)

## 2023-06-01 LAB — CBC
Hematocrit: 40.3 % (ref 37.5–51.0)
Hemoglobin: 13.1 g/dL (ref 13.0–17.7)
MCH: 28.9 pg (ref 26.6–33.0)
MCHC: 32.5 g/dL (ref 31.5–35.7)
MCV: 89 fL (ref 79–97)
Platelets: 187 10*3/uL (ref 150–450)
RBC: 4.53 x10E6/uL (ref 4.14–5.80)
RDW: 13.6 % (ref 11.6–15.4)
WBC: 3.8 10*3/uL (ref 3.4–10.8)

## 2023-06-01 LAB — TSH: TSH: 1.35 u[IU]/mL (ref 0.450–4.500)

## 2023-06-02 ENCOUNTER — Telehealth (INDEPENDENT_AMBULATORY_CARE_PROVIDER_SITE_OTHER): Payer: Self-pay | Admitting: Gastroenterology

## 2023-06-02 DIAGNOSIS — N390 Urinary tract infection, site not specified: Secondary | ICD-10-CM | POA: Diagnosis not present

## 2023-06-02 DIAGNOSIS — G894 Chronic pain syndrome: Secondary | ICD-10-CM | POA: Diagnosis not present

## 2023-06-02 DIAGNOSIS — M545 Low back pain, unspecified: Secondary | ICD-10-CM | POA: Diagnosis not present

## 2023-06-02 NOTE — Telephone Encounter (Signed)
Pt wife Dennie Bible contacted with pre op appt information

## 2023-06-04 IMAGING — DX DG CHEST 2V
2 series · 2 of 2 positions shown · non-contrast
Comparison: 04/19/2019.

CLINICAL DATA: cough, shob

EXAM:
CHEST - 2 VIEW

[chest pa]
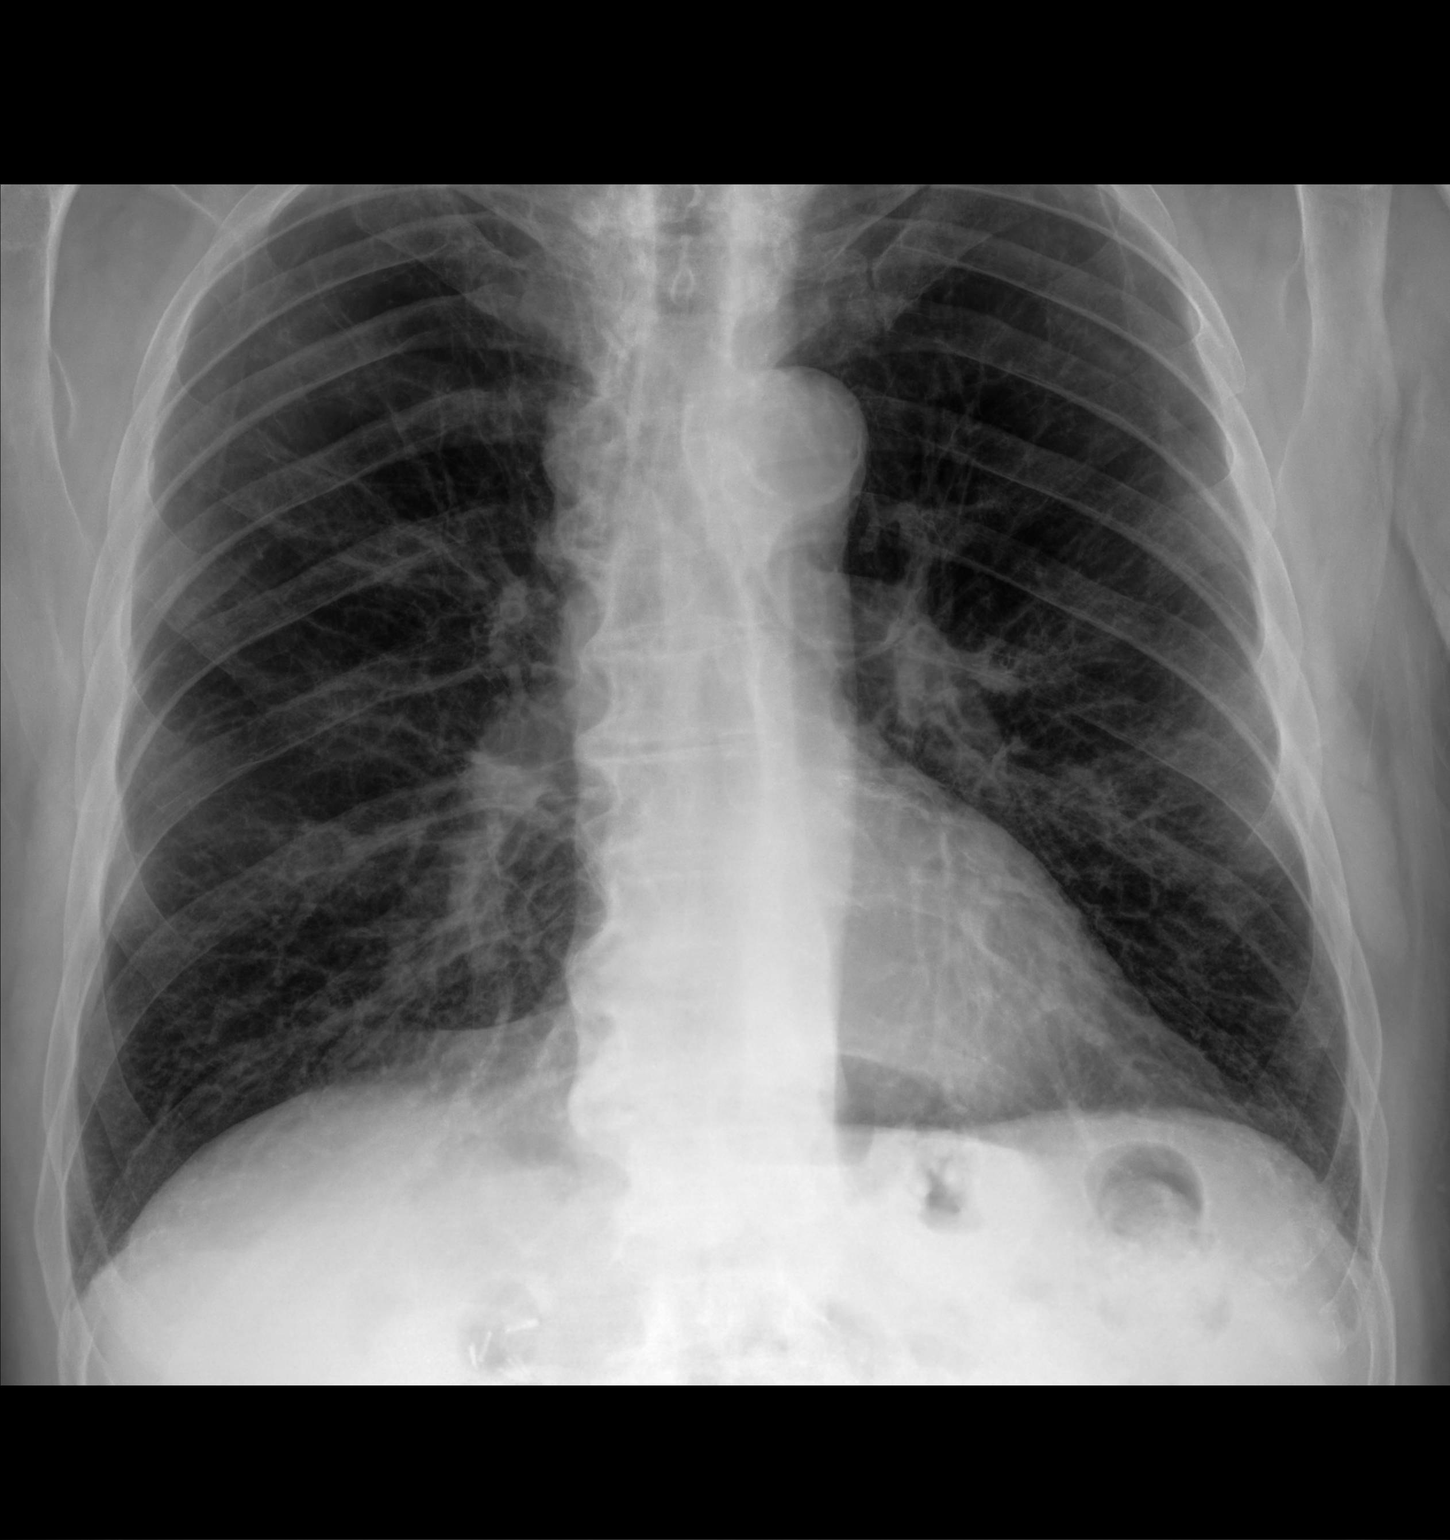

[chest lat]
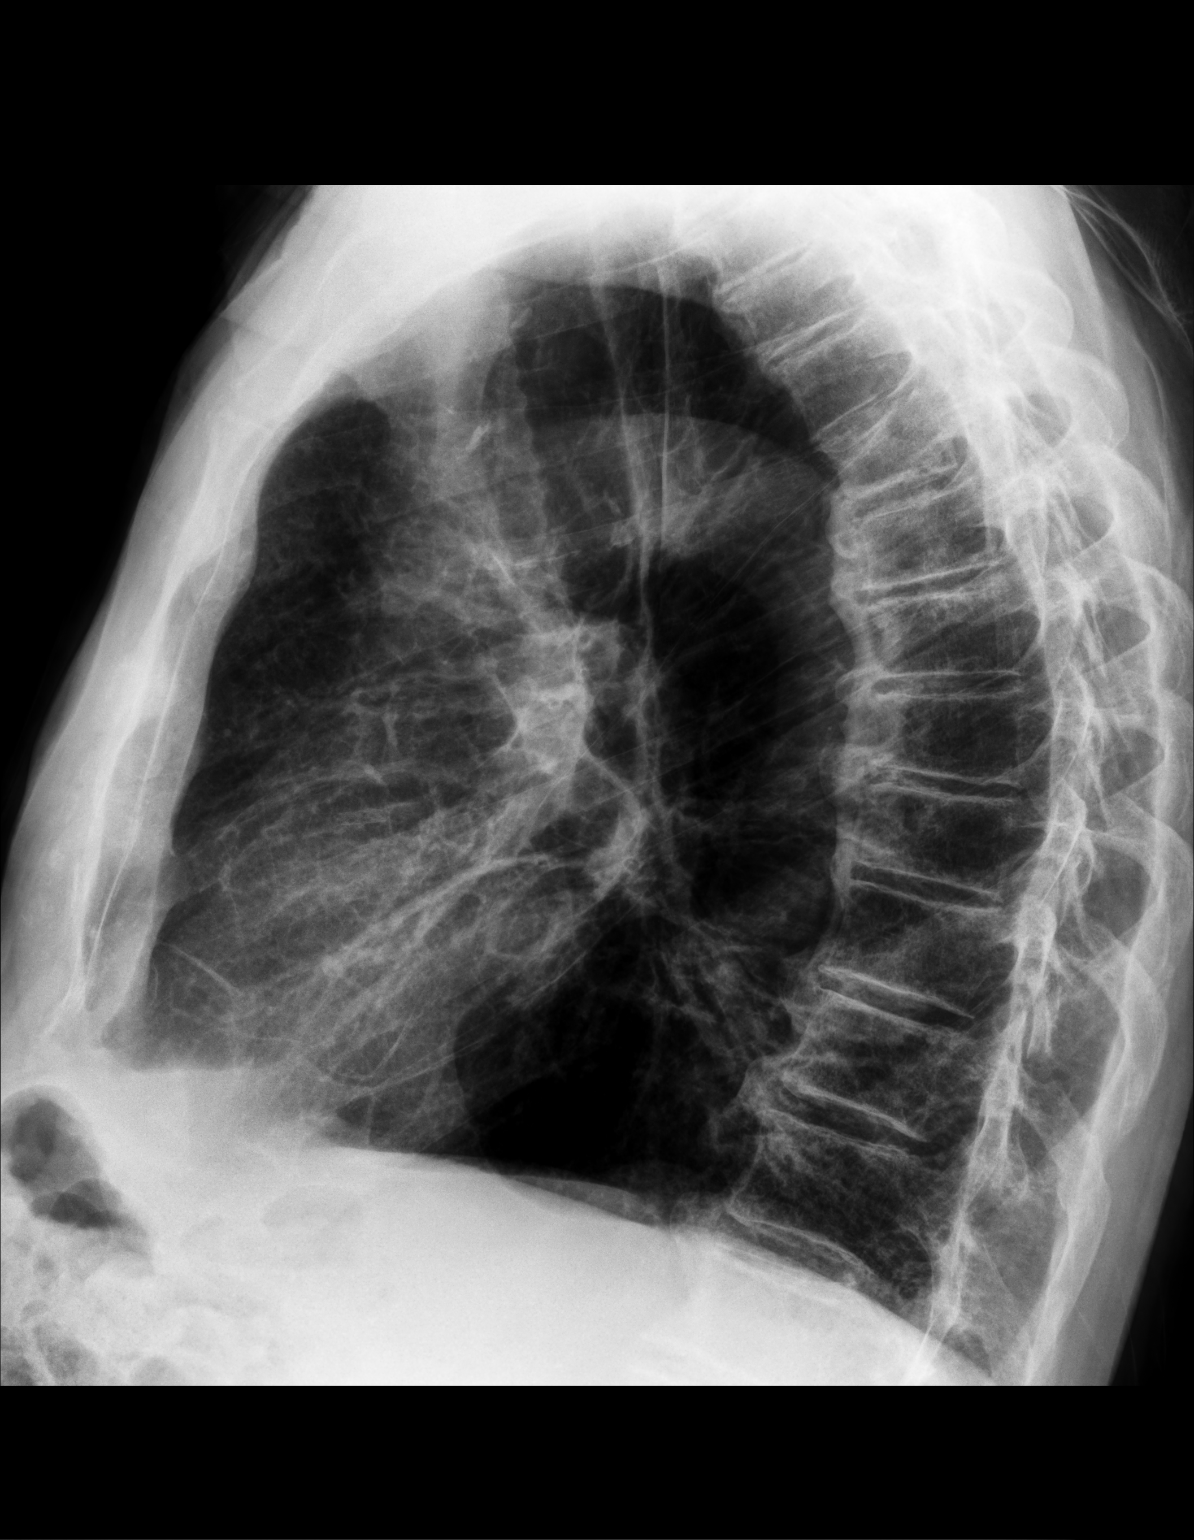

[2 of 2 positions shown; findings below may reference images not displayed]

FINDINGS: The heart size and mediastinal contours are within normal limits.
Chronic mild interstitial prominence without consolidation. No
visible pleural effusions or pneumothorax. Biapical
pleuroparenchymal scarring. Thoracic spine degenerative change.
IMPRESSION: Chronic mild interstitial prominence without consolidation.

## 2023-06-07 ENCOUNTER — Telehealth (INDEPENDENT_AMBULATORY_CARE_PROVIDER_SITE_OTHER): Payer: Self-pay | Admitting: *Deleted

## 2023-06-07 NOTE — Progress Notes (Unsigned)
Jeremy [name], male    DOB: December 22, 1935,    MRN: [id number]   Brief patient profile:  78 yowm quit smoking 1990 p MI  referred to pulmonary clinic in [place]  10/13/2021 by Jeremy Jeremy [name] for copd eval with Nov 2022 > ER with dx aecopd with GOLD 2 criteria in 2017 but not requiring any maint rx up until Nov 2022     History of Present Illness  10/13/2021  Pulmonary/ 1st [name] eval/ Jeremy [name] / Jeremy [name] [name] / on ACEi/breztri  Chief Complaint  Patient presents with   Consult    Patient had covid in 2019. Patient states that he does feel better now. States that he really needed to be seen a month ago but is better now. States that he is able to walk longer distance now than before.   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 Much worse in am's historically but better now  Cough: none but still urge to clear his throat Sleep: bed blocks/ one pillow SABA use: much less now breztri one bid / maybe one albuterol hfa/ neb one each am  Rec Ok to continue lisinopril but it may be part of the problem with your throat (clearing, choking, gagging, something  stuck, problem swallowing)   Plan A = Automatic = Always=    Breztri Take 1-2 puffs first thing in am and then another 2 puffs about 12 hours later.   Work on inhaler technique:   Plan B = Backup (to supplement plan A, not to replace it)Only use your albuterol inhaler as a rescue medication to be used if you can't catch your breath by resting or doing a relaxed purse lip pattern.  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 Cough: min rattling daytime/ no purulent sputum  Sleeping: better at hs on bed blocks  SABA use: alb q am (was supposed to be prn  02: not using  Rec Plan A = Automatic = Always=    performist/yupelri each am and performist 12 hours later  Plan B = Backup (to supplement plan A, not to replace it) Only use your albuterol nebulizer as a rescue medication Plan C = Crisis (instead of Plan B but only if Plan B stops working) Prednisone 10 mg take  4 each am x 2 days,   2 each am x 2 days,  1 each am x 2 days and stop  Ok to use Mucinex dm 1200 mg every 12 hours as needed for cough  Ok to use 02 as needed but goal is to keep the oxygen meter over 90%   Please keep  follow up [name]  but call sooner if needed  - bring  all medications/ solutions    06/08/2023  f/u ov/Jeremy [name] [name]/Jeremy [name] re: GOLD 2 copd  maint on yupelri / bud  did  bring meds / still on xarelto for dvt/PE rx per  Jeremy [name] [name]  Chief Complaint  Patient presents with   Follow-up    3 month follow up   Dyspnea:  limited more by leg weakness  than breathing  Cough: none  Sleeping: bed blocks and 1 pillow s resp cc  SABA use: none  02: not using     No obvious day to day or daytime variability or assoc excess/ purulent sputum or mucus plugs or hemoptysis or cp or chest tightness, subjective wheeze or overt sinus or hb symptoms.    Also denies any obvious fluctuation of symptoms with weather or environmental changes or other aggravating or alleviating factors except as outlined above   No unusual exposure hx or h/o childhood pna/ asthma or knowledge of premature birth.  Current Allergies, Complete Past Medical History, Past Surgical History, Family History, and Social History were reviewed in [employer] record.  ROS  The following are not active complaints unless bolded Hoarseness, sore throat, dysphagia, dental problems, itching, sneezing,   nasal congestion or discharge of excess mucus or purulent secretions, ear ache,   fever, chills, sweats, unintended wt loss or wt gain, classically pleuritic or exertional cp,  orthopnea pnd or arm/hand swelling  or leg swelling, presyncope, palpitations, abdominal pain, anorexia, nausea, vomiting, diarrhea  or change in bowel habits or change in bladder habits, change in stools slightly bloody or change in urine, dysuria, hematuria,  rash, arthralgias, visual complaints, headache, numbness, weakness or ataxia or problems with walking or coordination,  change in mood or  memory.        Current Meds  Medication Sig   Acetaminophen (TYLENOL 8 HOUR ARTHRITIS PAIN PO) Take 1 tablet by mouth every 8 (eight) hours as needed (pain).   albuterol (PROVENTIL HFA;VENTOLIN HFA) 108 (90 Base) MCG/ACT inhaler Inhale 1-2 puffs into the lungs every 6 (six) hours as needed for wheezing or shortness of breath.   apixaban (ELIQUIS) 5 MG TABS tablet Take 1 tablet (5 mg total) by mouth 2 (two) times daily.   ATORVASTATIN CALCIUM PO Take by mouth.   dextromethorphan-guaiFENesin (MUCINEX DM) 30-600 MG 12hr tablet Take 1 tablet by mouth 2 (two) times daily.   famotidine (PEPCID) 20 MG tablet One after supper   formoterol (PERFOROMIST) 20 MCG/2ML nebulizer solution One vial every 12 hours   levocetirizine (XYZAL) 5 MG tablet Take 5 mg by mouth every evening.   losartan (COZAAR) 100 MG tablet Take 100 mg by mouth daily.   Menthol, Topical Analgesic, (ICY HOT) 7.5 % (Roll) MISC Apply 1 each topically as needed (pain).   nitroGLYCERIN (NITROSTAT) 0.4 MG SL tablet PLACE 1 TABLET UNDER THE TONGUE EVERY 5 MINUTES AS NEEDED FOR CHEST PAIN. MAX 3 TABLETS PER EVENT. CALL 911   pantoprazole (PROTONIX) 40 MG tablet Take 1 tablet (40 mg total) by mouth 2 (two) times daily.   PRESCRIPTION MEDICATION Cholesterol med but unsure of name of med.   revefenacin (YUPELRI) 175 MCG/3ML nebulizer solution Take 3 mLs (175 mcg total) by  nebulization daily.           Past Medical History:  Diagnosis Date   Abnormal CXR 4/20011   Chest  CT mode/serere COPD ( no mediastinal abnormality)   CAD (coronary artery disease) 2005   anterior MI with stent to the LAD in 1991 / stent 1996 / nuclear 2005, no ischemia   Carotid bruit    Doppler, June, 2013, 0-39% bilateral   COPD (chronic obstructive pulmonary disease) (HCC)    Diverticula  of colon    Ejection fraction    55-60%, echo, April, 2011, could not estimate RV pressure   Esophagitis    HOH (hard of hearing)    Hyperlipidemia    Hypertension    Myocardial infarction (HCC)    x2   Shortness of breath    with exertion   Sleep apnea    Stop Bang score of 5   Tobacco abuse         Objective:    Wts  06/08/2023       180  04/08/2023       186  02/17/2023         187 01/18/2023         190  11/03/2022       190   08/05/2022     193 02/11/2022       187   12/05/21 185 lb 9.6 oz (84.2 kg)  11/22/21 187 lb (84.8 kg)  10/13/21 187 lb (84.8 kg)     Vital signs reviewed  06/08/2023  - Note at rest 02 sats  92% on RA   General appearance:    pleasant amb wm nad    HEENT : Oropharynx  clear       NECK :  without  apparent JVD/ palpable Nodes/TM    LUNGS: no acc muscle use,  Mild barrel  contour chest wall with bilateral  Distant end exp  wheeze and  without cough on insp or exp maneuvers  and mild  Hyperresonant  to  percussion bilaterally     CV:  RRR  no s3 or murmur or increase in P2, and no edema   ABD:  soft and nontender with pos end  insp Hoover's  in the supine position.  No bruits or organomegaly appreciated   MS:  Nl gait/ ext warm without deformities Or obvious joint restrictions  calf tenderness, cyanosis or clubbing    SKIN: warm and dry without lesions    NEURO:  alert, approp, nl sensorium with  no motor or cerebellar deficits apparent.           Assessment

## 2023-06-07 NOTE — Telephone Encounter (Signed)
Vm left by a lady calling for patient asking for a nurse to call her. States patient having some bleeding today and to call [phone].   I called and left a message to return call

## 2023-06-08 ENCOUNTER — Encounter: Payer: Self-pay | Admitting: Internal Medicine

## 2023-06-08 ENCOUNTER — Telehealth (INDEPENDENT_AMBULATORY_CARE_PROVIDER_SITE_OTHER): Payer: Self-pay | Admitting: *Deleted

## 2023-06-08 ENCOUNTER — Telehealth: Payer: Self-pay | Admitting: *Deleted

## 2023-06-08 ENCOUNTER — Ambulatory Visit: Payer: Medicare Other | Admitting: Internal Medicine

## 2023-06-08 VITALS — BP 113/75 | HR 105 | Ht 72.0 in | Wt 180.2 lb

## 2023-06-08 DIAGNOSIS — J449 Chronic obstructive pulmonary disease, unspecified: Secondary | ICD-10-CM

## 2023-06-08 NOTE — Telephone Encounter (Signed)
Per dr Tasia Catchings. Just monitor for bleeding for now. Had colonscopy 2 years ago and does not need another one at this time. Let us know if he sees anymore bleeding. Left message to return call to discuss

## 2023-06-08 NOTE — Telephone Encounter (Signed)
-----   Message from Franky Macho sent at 06/08/2023 11:13 AM EDT ----- Regarding: RE: Eliquis Hi   Given he is high risk so , NO need to hold his Eliquis for EGD. He can continue Eliquis   Thanks ----- Message ----- From: Elsie Amis, RN Sent: 06/08/2023  11:06 AM EDT To: Akesha Uresti S Francisca Langenderfer, CMA; Marlowe Shores, LPN; # Subject: Eliquis                                        Good morning! Does Nghia Masser need to hold his eliquis for his EGD? If so, for how long?

## 2023-06-08 NOTE — Telephone Encounter (Signed)
Called wife back and she states yesterday he had two bowel movements that had bright red blood. States it was not a lot of blood. He does not have any dizziness and no more than usual sob than he normally does with copd. He is feeling more weak than normal. Ate a good breaksfast today and she is trying to get him to drink a protein shake now. On eliquis. Wonders if he needs to stop. Had EGD scheduled for Friday.   [phone]

## 2023-06-08 NOTE — Assessment & Plan Note (Addendum)
Quit smoking 1990  - Spirometry 11/08/15   FEV1 1.89 (58%)  Ratio 0.49 p 10% improvement from saba  - 10/13/2021  After extensive coaching inhaler device,  effectiveness =    75% (short Ti)  - d/c acei 11/22/21 > all wheeze resolve by 12/05/2021 ov - PFT's  12/03/21  FEV1 1.76 (58 % ) ratio 0.42  p 10 % improvement from saba p 0 prior to study with DLCO  14.04 (55%) corrects to 2.66  (69%)  for alv volume and FV curve classically concave    - 11/03/2022  After extensive coaching inhaler device,  effectiveness =    80% (slt delayed trigger) - 01/18/2023 flared on breztri/  "neb is the only thing that helps"  with assoc throat clearing ? Adverse effects of hfa so rec change to neb formoterol/bud   - 02/17/2023 added yupelri  - 04/08/2023 added prn pred x 6 d and if needing freq next step is add budesonide to neb  - 04/08/2023   Walked on RA  x  2  lap(s) =  approx 300  ft  @  mod pace, stopped due to sob with lowest 02 sats 89%    Group D (now reclassified as E) in terms of symptom/risk and laba/lama/ICS  therefore appropriate rx at this point >>>  lama/laba neb with option to add bud immediately at onset of any resp flair as can't tol saba apparently       F/u q 6 m, sooner if needed with gi w/u in meantime in progress.     Each maintenance medication was reviewed in detail including emphasizing most importantly the difference between maintenance and prns and under what circumstances the prns are to be triggered using an action plan format where appropriate.  Total time for H and P, chart review, counseling, reviewing neb/02/pulse ox  device(s) and generating customized AVS unique to this office visit / same day charting = 25 min

## 2023-06-08 NOTE — Pre-Procedure Instructions (Signed)
  RE: Eliquis Received: Today Ahmed, Juanetta Beets, MD  Elsie Amis, RN; Armstead Peaks, CMA; Marlowe Shores, LPN Hi  Given he is high risk so , NO need to hold his Eliquis for EGD. He can continue Eliquis  Thanks       Previous Messages    ----- Message ----- From: Elsie Amis, RN Sent: 06/08/2023  11:06 AM EDT To: Mindy S Estudillo, CMA; Marlowe Shores, LPN; * Subject: Eliquis                                        Good morning! Does Asheton Lodermeier need to hold his eliquis for his EGD? If so, for how long?

## 2023-06-08 NOTE — Patient Instructions (Signed)
For any flare of cough wheeze short of breath add budesonide 0.25 mg twice daily until better for a whole week    Please schedule a follow up visit in 6  months but call sooner if needed

## 2023-06-09 ENCOUNTER — Encounter (HOSPITAL_COMMUNITY)
Admission: RE | Admit: 2023-06-09 | Discharge: 2023-06-09 | Disposition: A | Discharge status: Home / Self Care | Payer: Medicare Other | Source: Ambulatory Visit | Attending: Gastroenterology | Admitting: Gastroenterology

## 2023-06-09 NOTE — Telephone Encounter (Signed)
Discussed with patient's wife per Dr. Tasia Catchings - Per dr Tasia Catchings. Just monitor for bleeding for now. Had colonscopy 2 years ago and does not need another one at this time. Let us know if he sees anymore bleeding. Left message to return call to discuss   Wife states he has not had any more bleeding since that day she called and will let us know if it happens again.

## 2023-06-11 ENCOUNTER — Encounter (HOSPITAL_COMMUNITY): Payer: Self-pay

## 2023-06-11 ENCOUNTER — Other Ambulatory Visit: Payer: Self-pay

## 2023-06-11 ENCOUNTER — Ambulatory Visit (HOSPITAL_COMMUNITY): Payer: Medicare Other | Admitting: Anesthesiology

## 2023-06-11 ENCOUNTER — Ambulatory Visit (HOSPITAL_COMMUNITY)
Admission: RE | Admit: 2023-06-11 | Discharge: 2023-06-11 | Disposition: A | Discharge status: Home / Self Care | Payer: Medicare Other | Source: Ambulatory Visit | Attending: Gastroenterology | Admitting: Gastroenterology

## 2023-06-11 ENCOUNTER — Encounter (HOSPITAL_COMMUNITY)
Admission: RE | Disposition: A | Discharge status: Home / Self Care | Payer: Self-pay | Source: Ambulatory Visit | Attending: Gastroenterology

## 2023-06-11 DIAGNOSIS — R634 Abnormal weight loss: Secondary | ICD-10-CM | POA: Diagnosis not present

## 2023-06-11 DIAGNOSIS — Z87891 Personal history of nicotine dependence: Secondary | ICD-10-CM | POA: Diagnosis not present

## 2023-06-11 DIAGNOSIS — K5730 Diverticulosis of large intestine without perforation or abscess without bleeding: Secondary | ICD-10-CM | POA: Diagnosis not present

## 2023-06-11 DIAGNOSIS — K449 Diaphragmatic hernia without obstruction or gangrene: Secondary | ICD-10-CM | POA: Diagnosis not present

## 2023-06-11 DIAGNOSIS — K2950 Unspecified chronic gastritis without bleeding: Secondary | ICD-10-CM | POA: Diagnosis not present

## 2023-06-11 DIAGNOSIS — Z7901 Long term (current) use of anticoagulants: Secondary | ICD-10-CM | POA: Insufficient documentation

## 2023-06-11 DIAGNOSIS — Z6824 Body mass index (BMI) 24.0-24.9, adult: Secondary | ICD-10-CM | POA: Insufficient documentation

## 2023-06-11 DIAGNOSIS — I252 Old myocardial infarction: Secondary | ICD-10-CM | POA: Insufficient documentation

## 2023-06-11 DIAGNOSIS — K219 Gastro-esophageal reflux disease without esophagitis: Secondary | ICD-10-CM | POA: Diagnosis not present

## 2023-06-11 DIAGNOSIS — K222 Esophageal obstruction: Secondary | ICD-10-CM | POA: Insufficient documentation

## 2023-06-11 DIAGNOSIS — J449 Chronic obstructive pulmonary disease, unspecified: Secondary | ICD-10-CM | POA: Insufficient documentation

## 2023-06-11 DIAGNOSIS — E785 Hyperlipidemia, unspecified: Secondary | ICD-10-CM | POA: Insufficient documentation

## 2023-06-11 DIAGNOSIS — I2510 Atherosclerotic heart disease of native coronary artery without angina pectoris: Secondary | ICD-10-CM | POA: Insufficient documentation

## 2023-06-11 DIAGNOSIS — I10 Essential (primary) hypertension: Secondary | ICD-10-CM | POA: Diagnosis not present

## 2023-06-11 DIAGNOSIS — K56699 Other intestinal obstruction unspecified as to partial versus complete obstruction: Secondary | ICD-10-CM | POA: Diagnosis not present

## 2023-06-11 DIAGNOSIS — Z86718 Personal history of other venous thrombosis and embolism: Secondary | ICD-10-CM | POA: Insufficient documentation

## 2023-06-11 DIAGNOSIS — G4730 Sleep apnea, unspecified: Secondary | ICD-10-CM | POA: Insufficient documentation

## 2023-06-11 DIAGNOSIS — R1310 Dysphagia, unspecified: Secondary | ICD-10-CM | POA: Diagnosis not present

## 2023-06-11 DIAGNOSIS — K3189 Other diseases of stomach and duodenum: Secondary | ICD-10-CM | POA: Diagnosis not present

## 2023-06-11 DIAGNOSIS — K2970 Gastritis, unspecified, without bleeding: Secondary | ICD-10-CM

## 2023-06-11 DIAGNOSIS — K2091 Esophagitis, unspecified with bleeding: Secondary | ICD-10-CM | POA: Insufficient documentation

## 2023-06-11 HISTORY — PX: BIOPSY: SHX5522

## 2023-06-11 HISTORY — PX: ESOPHAGOGASTRODUODENOSCOPY (EGD) WITH PROPOFOL: SHX5813

## 2023-06-11 SURGERY — ESOPHAGOGASTRODUODENOSCOPY (EGD) WITH PROPOFOL
Anesthesia: General

## 2023-06-11 MED ORDER — PROPOFOL 500 MG/50ML IV EMUL
INTRAVENOUS | Status: DC | PRN
  Administered 2023-06-11: 125 ug/kg/min via INTRAVENOUS

## 2023-06-11 MED ORDER — PROPOFOL 10 MG/ML IV BOLUS
INTRAVENOUS | Status: DC | PRN
  Administered 2023-06-11: 120 mg via INTRAVENOUS

## 2023-06-11 MED ORDER — LIDOCAINE HCL 1 % IJ SOLN
INTRAMUSCULAR | Status: DC | PRN
  Administered 2023-06-11: 60 mg via INTRADERMAL

## 2023-06-11 MED ORDER — LACTATED RINGERS IV SOLN: INTRAVENOUS | Status: DC

## 2023-06-11 NOTE — Interval H&P Note (Signed)
History and Physical Interval Note:  06/11/2023 9:50 AM  Jeremy [name]  has presented today for surgery, with the diagnosis of WEIGHT LOSS, ABD PAIN.  The various methods of treatment have been discussed with the patient and family. After consideration of risks, benefits and other options for treatment, the patient has consented to  Procedure(s) with comments: ESOPHAGOGASTRODUODENOSCOPY (EGD) WITH PROPOFOL (N/A) - 8:45AM;ASA 3 as a surgical intervention.  The patient's history has been reviewed, patient examined, no change in status, stable for surgery.  I have reviewed the patient's chart and labs.  Questions were answered to the patient's satisfaction.     Juanetta Beets Forever Arechiga

## 2023-06-11 NOTE — Anesthesia Preprocedure Evaluation (Signed)
Anesthesia Evaluation  Patient identified by MRN, date of birth, ID band Patient awake    Reviewed: Allergy & Precautions, H&P , NPO status , Patient's Chart, lab work & pertinent test results, reviewed documented beta blocker date and time   Airway Mallampati: II  TM Distance: >3 FB Neck ROM: full    Dental no notable dental hx.    Pulmonary neg pulmonary ROS, shortness of breath, sleep apnea , COPD, former smoker   Pulmonary exam normal breath sounds clear to auscultation       Cardiovascular Exercise Tolerance: Good hypertension, + CAD and + Past MI  negative cardio ROS  Rhythm:regular Rate:Normal     Neuro/Psych negative neurological ROS  negative psych ROS   GI/Hepatic negative GI ROS, Neg liver ROS,,,  Endo/Other  negative endocrine ROS    Renal/GU Renal diseasenegative Renal ROS  negative genitourinary   Musculoskeletal   Abdominal   Peds  Hematology negative hematology ROS (+)   Anesthesia Other Findings   Reproductive/Obstetrics negative OB ROS                             Anesthesia Physical Anesthesia Plan  ASA: 3  Anesthesia Plan: General   Post-op Pain Management:    Induction:   PONV Risk Score and Plan: Propofol infusion  Airway Management Planned:   Additional Equipment:   Intra-op Plan:   Post-operative Plan:   Informed Consent: I have reviewed the patients History and Physical, chart, labs and discussed the procedure including the risks, benefits and alternatives for the proposed anesthesia with the patient or authorized representative who has indicated his/her understanding and acceptance.     Dental Advisory Given  Plan Discussed with: CRNA  Anesthesia Plan Comments:        Anesthesia Quick Evaluation

## 2023-06-11 NOTE — Op Note (Signed)
[facility] Patient Name: Jeremy [name] Procedure Date: 06/11/2023 10:16 AM MRN: [id number] Date of Birth: 01-29-1936 Attending MD: Sanjuan Dame , MD, [id number] CSN: [id number] Age: 87 Admit Type: Outpatient Procedure:                Upper GI endoscopy Indications:              Dysphagia, Weight loss Providers:                Sanjuan Dame, MD, Buel Ream. Thomasena Edis RN, RN,                            Lennice Sites Technician, Pensions consultant Referring MD:              Medicines:                Monitored Anesthesia Care Complications:            No immediate complications. Estimated Blood Loss:     Estimated blood loss was minimal. Procedure:                Pre-Anesthesia Assessment:                           - Prior to the procedure, a History and Physical                            was performed, and patient medications and                            allergies were reviewed. The patient's tolerance of                            previous anesthesia was also reviewed. The risks                            and benefits of the procedure and the sedation                            options and risks were discussed with the patient.                            All questions were answered, and informed consent                            was obtained. Prior Anticoagulants: The patient has                            taken Eliquis (apixaban), last dose was 3 days                            prior to procedure. ASA Grade Assessment: III - A                            patient with severe systemic disease. After  reviewing the risks and benefits, the patient was                            deemed in satisfactory condition to undergo the                            procedure.                           After obtaining informed consent, the endoscope was                            passed under direct vision. Throughout the                            procedure, the patient's blood  pressure, pulse, and                            oxygen saturations were monitored continuously. The                            GIF-H190 ([id number]) scope was introduced through the                            mouth, and advanced to the second part of duodenum.                            The upper GI endoscopy was accomplished without                            difficulty. The patient tolerated the procedure                            well. Scope In: 10:31:12 AM Scope Out: 10:37:48 AM Total Procedure Duration: 0 hours 6 minutes 36 seconds  Findings:      One benign-appearing, intrinsic moderate stenosis was found in the lower       third of the esophagus. The stenosis was traversed. A TTS dilator was       passed through the scope. Dilation with a 15-16.5-18 mm balloon dilator       was performed to 16.5 mm. The dilation site was examined and showed       moderate mucosal disruption and complete resolution of luminal       narrowing. Estimated blood loss was minimal.      A 2 cm hiatal hernia was present.      Mildly erythematous mucosa without bleeding was found in the gastric       antrum. Biopsies were taken with a cold forceps for histology.      The duodenal bulb and second portion of the duodenum were normal. Impression:               - Benign-appearing esophageal stenosis. Dilated.                           - 2 cm hiatal hernia.                           -  Erythematous mucosa in the antrum. Biopsied.                           - Normal duodenal bulb and second portion of the                            duodenum. Moderate Sedation:      Per Anesthesia Care Recommendation:           - Patient has a contact number available for                            emergencies. The signs and symptoms of potential                            delayed complications were discussed with the                            patient. Return to normal activities tomorrow.                            Written  discharge instructions were provided to the                            patient.                           - Resume previous diet.                           - Continue present medications.                           - Await pathology results. Procedure Code(s):        --- Professional ---                           [phone], Esophagogastroduodenoscopy, flexible,                            transoral; with transendoscopic balloon dilation of                            esophagus (less than 30 mm diameter)                           43239, 59, Esophagogastroduodenoscopy, flexible,                            transoral; with biopsy, single or multiple Diagnosis Code(s):        --- Professional ---                           K22.2, Esophageal obstruction                           K44.9, Diaphragmatic hernia without obstruction or  gangrene                           K31.89, Other diseases of stomach and duodenum                           R13.10, Dysphagia, unspecified                           R63.4, Abnormal weight loss CPT copyright 2022 American Medical Association. All rights reserved. The codes documented in this report are preliminary and upon coder review may  be revised to meet current compliance requirements. Sanjuan Dame, MD Sanjuan Dame, MD 06/11/2023 10:42:45 AM This report has been signed electronically. Number of Addenda: 0

## 2023-06-11 NOTE — Transfer of Care (Addendum)
Immediate Anesthesia Transfer of Care Note  Patient: Jeremy [name]  Procedure(s) Performed: ESOPHAGOGASTRODUODENOSCOPY (EGD) WITH PROPOFOL Balloon dilation wire-guided BIOPSY  Patient Location: Short Stay  Anesthesia Type:General  Level of Consciousness: drowsy and patient cooperative  Airway & Oxygen Therapy: Patient Spontanous Breathing and Patient connected to nasal cannula oxygen  Post-op Assessment: Report given to RN and Post -op Vital signs reviewed and stable  Post vital signs: Reviewed and stable  Last Vitals:  Vitals Value Taken Time  BP 91/56 06/11/23   1050  Temp 36.4 06/11/23   1044  Pulse 69 06/11/23   1044  Resp 24 06/11/23   1044  SpO2 95% 06/11/23   1044    Last Pain:  Vitals:   06/11/23 1027  TempSrc:   PainSc: 0-No pain         Complications: No notable events documented.

## 2023-06-11 NOTE — Discharge Instructions (Signed)

## 2023-06-14 LAB — SURGICAL PATHOLOGY

## 2023-06-14 NOTE — Anesthesia Postprocedure Evaluation (Signed)
Anesthesia Post Note  Patient: Jeremy [name]  Procedure(s) Performed: ESOPHAGOGASTRODUODENOSCOPY (EGD) WITH PROPOFOL Balloon dilation wire-guided BIOPSY  Patient location during evaluation: Phase II Anesthesia Type: General Level of consciousness: awake Pain management: pain level controlled Vital Signs Assessment: post-procedure vital signs reviewed and stable Respiratory status: spontaneous breathing and respiratory function stable Cardiovascular status: blood pressure returned to baseline and stable Postop Assessment: no headache and no apparent nausea or vomiting Anesthetic complications: no Comments: Late entry   No notable events documented.   Last Vitals:  Vitals:   06/11/23 1044 06/11/23 1050  BP: (!) 88/44 (!) 91/56  Pulse: 69   Resp: (!) 24   Temp: 36.4 C   SpO2: 95%     Last Pain:  Vitals:   06/11/23 1044  TempSrc: Oral  PainSc:                  Windell Norfolk

## 2023-06-16 ENCOUNTER — Encounter (HOSPITAL_COMMUNITY): Payer: Self-pay | Admitting: Family Medicine

## 2023-06-16 ENCOUNTER — Encounter (INDEPENDENT_AMBULATORY_CARE_PROVIDER_SITE_OTHER): Payer: Self-pay | Admitting: *Deleted

## 2023-06-16 ENCOUNTER — Other Ambulatory Visit (HOSPITAL_COMMUNITY): Payer: Self-pay | Admitting: Family Medicine

## 2023-06-16 DIAGNOSIS — Z86718 Personal history of other venous thrombosis and embolism: Secondary | ICD-10-CM

## 2023-06-16 DIAGNOSIS — M25561 Pain in right knee: Secondary | ICD-10-CM | POA: Diagnosis not present

## 2023-06-16 DIAGNOSIS — M6281 Muscle weakness (generalized): Secondary | ICD-10-CM | POA: Diagnosis not present

## 2023-06-16 DIAGNOSIS — I10 Essential (primary) hypertension: Secondary | ICD-10-CM | POA: Diagnosis not present

## 2023-06-16 DIAGNOSIS — Z8719 Personal history of other diseases of the digestive system: Secondary | ICD-10-CM | POA: Diagnosis not present

## 2023-06-16 DIAGNOSIS — I2699 Other pulmonary embolism without acute cor pulmonale: Secondary | ICD-10-CM | POA: Diagnosis not present

## 2023-06-16 DIAGNOSIS — R630 Anorexia: Secondary | ICD-10-CM | POA: Diagnosis not present

## 2023-06-16 NOTE — Progress Notes (Signed)
I reviewed the pathology results. Ann, can you send her a letter with the findings as described below please?  Thanks,  Vista Lawman, MD Gastroenterology and Hepatology [facility]  ---------------------------------------------------------------------------------------------  [facility] [address], [address], [place], [place] [address] Phone:  [phone]   06/16/23 [name], [place]   Dear Jeremy [name],  I am writing to inform you that the biopsies taken during your recent endoscopic examination showed:  No H. Pylori bacteria in stomach , or any early cancer changes to the stomach mucosa ( Intestinal metaplasia)   Please call us at [phone] if you have persistent problems or have questions about your condition that have not been fully answered at this time.  Sincerely,  Vista Lawman, MD Gastroenterology and Hepatology

## 2023-06-18 ENCOUNTER — Encounter (HOSPITAL_COMMUNITY): Payer: Self-pay | Admitting: Gastroenterology

## 2023-06-18 ENCOUNTER — Other Ambulatory Visit (HOSPITAL_COMMUNITY): Payer: Self-pay | Admitting: Family Medicine

## 2023-06-18 DIAGNOSIS — I2699 Other pulmonary embolism without acute cor pulmonale: Secondary | ICD-10-CM

## 2023-06-23 NOTE — Telephone Encounter (Signed)
06/23/2023  Jeremy [name] 1936-06-20 [id number]  Reason for referral: medication assistance  Referral source: [facility] Referral medication(s): Breztri Current insurance:[employer]   Medication Assistance Findings:  Medication assistance needs identified: Spoke with Patient's wife, Dennie Bible. 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 A note was sent to Dr. Scharlene Gloss office to update the patient's medication list  He is now on Fomoterol and Yuperli nebulizer solutions.     Additional medication assistance options reviewed with patient as warranted:  No other options identified  Plan: Patient assistance case will be closed. Beecher Mcardle, PharmD, BCACP [facility] Clinical Pharmacist [phone]

## 2023-06-29 ENCOUNTER — Other Ambulatory Visit (HOSPITAL_COMMUNITY): Payer: Self-pay | Admitting: Family Medicine

## 2023-06-29 ENCOUNTER — Ambulatory Visit (HOSPITAL_COMMUNITY)
Admission: RE | Admit: 2023-06-29 | Discharge: 2023-06-29 | Disposition: A | Discharge status: Home / Self Care | Payer: Medicare Other | Source: Ambulatory Visit | Attending: Family Medicine | Admitting: Family Medicine

## 2023-06-29 DIAGNOSIS — R918 Other nonspecific abnormal finding of lung field: Secondary | ICD-10-CM | POA: Diagnosis not present

## 2023-06-29 DIAGNOSIS — Z0389 Encounter for observation for other suspected diseases and conditions ruled out: Secondary | ICD-10-CM | POA: Diagnosis not present

## 2023-06-29 DIAGNOSIS — R590 Localized enlarged lymph nodes: Secondary | ICD-10-CM | POA: Diagnosis not present

## 2023-06-29 DIAGNOSIS — I2699 Other pulmonary embolism without acute cor pulmonale: Secondary | ICD-10-CM | POA: Insufficient documentation

## 2023-06-29 DIAGNOSIS — Z86718 Personal history of other venous thrombosis and embolism: Secondary | ICD-10-CM | POA: Diagnosis not present

## 2023-06-29 DIAGNOSIS — J439 Emphysema, unspecified: Secondary | ICD-10-CM | POA: Diagnosis not present

## 2023-06-29 LAB — HM COLONOSCOPY

## 2023-06-29 MED ORDER — IOHEXOL 350 MG/ML SOLN
75.0000 mL | Freq: Once | INTRAVENOUS | Status: AC | PRN
  Administered 2023-06-29: 75 mL via INTRAVENOUS

## 2023-07-01 ENCOUNTER — Encounter (INDEPENDENT_AMBULATORY_CARE_PROVIDER_SITE_OTHER): Payer: Self-pay | Admitting: *Deleted

## 2023-07-05 ENCOUNTER — Encounter (INDEPENDENT_AMBULATORY_CARE_PROVIDER_SITE_OTHER): Payer: Self-pay | Admitting: Gastroenterology

## 2023-07-05 ENCOUNTER — Ambulatory Visit (INDEPENDENT_AMBULATORY_CARE_PROVIDER_SITE_OTHER): Payer: Medicare Other | Admitting: Gastroenterology

## 2023-07-05 VITALS — BP 131/80 | HR 86 | Temp 98.0°F | Ht 72.0 in | Wt 184.2 lb

## 2023-07-05 DIAGNOSIS — K625 Hemorrhage of anus and rectum: Secondary | ICD-10-CM

## 2023-07-05 DIAGNOSIS — Z8719 Personal history of other diseases of the digestive system: Secondary | ICD-10-CM | POA: Diagnosis not present

## 2023-07-05 DIAGNOSIS — R634 Abnormal weight loss: Secondary | ICD-10-CM

## 2023-07-05 DIAGNOSIS — Z09 Encounter for follow-up examination after completed treatment for conditions other than malignant neoplasm: Secondary | ICD-10-CM

## 2023-07-05 DIAGNOSIS — R1319 Other dysphagia: Secondary | ICD-10-CM

## 2023-07-05 NOTE — Progress Notes (Signed)
Vista Lawman , M.D. Gastroenterology & Hepatology [facility] [address] [place], [place] [address] Primary Care Physician: Benita Stabile, MD [address] [place] [address]  Chief Complaint:  Resolved painless hematochezia and Dysphagia. Want to follow up on results   History of Present Illness: Patient is a 87 year old male with history of CAD, esophagitis, dyslipidemia, DVT/PE on Eliquis, COPD, GERD hematochezia presumed from diverticular bleed is here for follow-up after recent upper endoscopy, with distal esophageal narrowing status post balloon dilation with resolution of dysphagia and gastric biopsies normal  Patient reports that few days ago he had 1 episode of dysphagia.  He was eating a lot of bacon and quick manner.  Father reports since the procedure his dysphagia has significantly improved and he is feeling better and notes that it only comes when he eats quickly  Patient has gained 4 pounds since last clinic appointment and has a good appetite without any further hematochezia or abdominal  Patient presented 04/2023  treated in hospital with blood per rectum baseline hemoglobin 13 dropped to 11 at that time.  Patient was treated conservatively at that time.  BUN/creatinine 26 1 point Patient has been taking Eliquis daily no black or stools or hematochezia has been noticed   Endoscopic history  05/2023  - Benign- appearing esophageal stenosis. Dilated. - 2 cm hiatal hernia. - Erythematous mucosa in the antrum. Biopsied. - Normal duodenal bulb and second portion of the duodenum.  No H. Pylori bacteria in stomach , or any early cancer changes to the stomach mucosa ( Intestinal metaplasia)   2022 Moderate distal esophageal stricture dilated with balloon dilated to 18 mm. Small sliding hiatal hernia. Normal examination stomach first and second part of duodenum.   Colonoscopy completed with ultraslim  scope. Numerous diverticula at sigmoid colon with noncritical stricture at 30 cm from the anal margin traversed with ultraslim scope. No polyps or other abnormalities noted. Anal papillae   Past Medical History: Past Medical History:  Diagnosis Date   Abnormal CXR 4/20011   Chest  CT mode/serere COPD ( no mediastinal abnormality)   CAD (coronary artery disease) 2005   anterior MI with stent to the LAD in 1991 / stent 1996 / nuclear 2005, no ischemia   Carotid bruit    Doppler, June, 2013, 0-39% bilateral   COPD (chronic obstructive pulmonary disease) (HCC)    Diverticula of colon    Ejection fraction    55-60%, echo, April, 2011, could not estimate RV pressure   Esophagitis    HOH (hard of hearing)    Hyperlipidemia    Hypertension    Myocardial infarction (HCC)    x2   Shortness of breath    with exertion   Sleep apnea    Stop Bang score of 5   Tobacco abuse     Past Surgical History: Past Surgical History:  Procedure Laterality Date   APPENDECTOMY     BIOPSY  11/17/2017   Procedure: BIOPSY;  Surgeon: Malissa Hippo, MD;  [facility];  Service: Endoscopy;;  gastric   BIOPSY  06/11/2023   Procedure: BIOPSY;  Surgeon: Franky Macho, MD;  [facility];  Service: Endoscopy;;   CARDIAC CATHETERIZATION     CATARACT EXTRACTION Left    CATARACT EXTRACTION W/PHACO Right 01/22/2014   Procedure: CATARACT EXTRACTION PHACO AND INTRAOCULAR LENS PLACEMENT (IOC);  Surgeon: Gemma Payor, MD;  Location: [facility];  Service: Ophthalmology;  Laterality:  Right;  CDE 7.46   CHOLECYSTECTOMY     8 years ago [facility]   COLONOSCOPY  10/05/2012   Dr. Karilyn Cota: multiple diverticula at sigmoid colon, multiple polyps (tubular adenomas), small external hemorrhoids   COLONOSCOPY N/A 11/18/2017   Procedure: COLONOSCOPY;  Surgeon: Malissa Hippo, MD;  [facility];  Service: Endoscopy;  Laterality: N/A;   COLONOSCOPY WITH PROPOFOL N/A 05/07/2021   Procedure: COLONOSCOPY WITH  PROPOFOL;  Surgeon: Malissa Hippo, MD;  [facility];  Service: Endoscopy;  Laterality: N/A;   CYST REMOVAL HAND     right wrist   ESOPHAGEAL DILATION N/A 05/23/2015   Procedure: ESOPHAGEAL DILATION;  Surgeon: Corbin Ade, MD;  [facility];  Service: Endoscopy;  Laterality: N/A;   ESOPHAGEAL DILATION N/A 11/17/2017   Procedure: ESOPHAGEAL DILATION;  Surgeon: Malissa Hippo, MD;  [facility];  Service: Endoscopy;  Laterality: N/A;   ESOPHAGEAL DILATION N/A 05/07/2021   Procedure: ESOPHAGEAL DILATION;  Surgeon: Malissa Hippo, MD;  [facility];  Service: Endoscopy;  Laterality: N/A;   ESOPHAGOGASTRODUODENOSCOPY N/A 05/23/2015   Dr. Jena Gauss: Ulcerative/ erosive reflux esophagitis with peptic stricture formation. Status post dilation as described. Hiatal hernia. Abnormal gastric mucosa of uncertain significance status post gastric biospy. +H.pylori   ESOPHAGOGASTRODUODENOSCOPY N/A 11/17/2017   Procedure: ESOPHAGOGASTRODUODENOSCOPY (EGD);  Surgeon: Malissa Hippo, MD;  [facility];  Service: Endoscopy;  Laterality: N/A;   ESOPHAGOGASTRODUODENOSCOPY (EGD) WITH PROPOFOL N/A 05/07/2021   Procedure: ESOPHAGOGASTRODUODENOSCOPY (EGD) WITH PROPOFOL;  Surgeon: Malissa Hippo, MD;  [facility];  Service: Endoscopy;  Laterality: N/A;   ESOPHAGOGASTRODUODENOSCOPY (EGD) WITH PROPOFOL N/A 06/11/2023   Procedure: ESOPHAGOGASTRODUODENOSCOPY (EGD) WITH PROPOFOL;  Surgeon: Franky Macho, MD;  [facility];  Service: Endoscopy;  Laterality: N/A;  8:45AM;ASA 3   PERCUTANEOUS STENT INTERVENTION     SKIN CANCER EXCISION  2015   ear & neck   TONSILLECTOMY      Family History: Family History  Problem Relation Age of Onset   Heart disease Mother    Heart disease Father    Heart disease Brother    Stomach cancer Brother    Colon cancer Neg Hx     Social History: Social History   Tobacco Use  Smoking Status Former   Current  packs/day: 0.00   Average packs/day: 3.0 packs/day for 12.0 years (36.0 ttl pk-yrs)   Types: Cigarettes   Start date: 09/15/1983   Quit date: 09/14/1988   Years since quitting: 34.8  Smokeless Tobacco Current   Types: Chew  Tobacco Comments   chews 80% of a pack tobacco per day -- 10/12 doesnt chew too much   Social History   Substance and Sexual Activity  Alcohol Use No   Alcohol/week: 0.0 standard drinks of alcohol   Social History   Substance and Sexual Activity  Drug Use No    Allergies: Allergies  Allergen Reactions   Ibuprofen     Gi bleed     Medications: Current Outpatient Medications  Medication Sig Dispense Refill   Acetaminophen (TYLENOL 8 HOUR ARTHRITIS PAIN PO) Take 1 tablet by mouth every 8 (eight) hours as needed (pain).     apixaban (ELIQUIS) 5 MG TABS tablet Take 1 tablet (5 mg total) by mouth 2 (two) times daily.     ATORVASTATIN CALCIUM PO Take by mouth.     dextromethorphan-guaiFENesin (MUCINEX DM) 30-600 MG 12hr tablet Take 1 tablet by mouth  2 (two) times daily. 20 tablet 0   formoterol (PERFOROMIST) 20 MCG/2ML nebulizer solution One vial every 12 hours 120 mL 11   levocetirizine (XYZAL) 5 MG tablet Take 5 mg by mouth every evening.     losartan (COZAAR) 100 MG tablet Take 100 mg by mouth daily.     Menthol, Topical Analgesic, (ICY HOT) 7.5 % (Roll) MISC Apply 1 each topically as needed (pain).     nitroGLYCERIN (NITROSTAT) 0.4 MG SL tablet PLACE 1 TABLET UNDER THE TONGUE EVERY 5 MINUTES AS NEEDED FOR CHEST PAIN. MAX 3 TABLETS PER EVENT. CALL 911 25 tablet 3   pantoprazole (PROTONIX) 40 MG tablet Take 1 tablet (40 mg total) by mouth 2 (two) times daily. 180 tablet 3   revefenacin (YUPELRI) 175 MCG/3ML nebulizer solution Take 3 mLs (175 mcg total) by nebulization daily. 90 mL 11   albuterol (PROVENTIL HFA;VENTOLIN HFA) 108 (90 Base) MCG/ACT inhaler Inhale 1-2 puffs into the lungs every 6 (six) hours as needed for wheezing or shortness of breath. (Patient  not taking: Reported on 07/05/2023)     No current facility-administered medications for this visit.    Review of Systems: GENERAL: negative for malaise, night sweats HEENT: No changes in hearing or vision, no nose bleeds or other nasal problems. NECK: Negative for lumps, goiter, pain and significant neck swelling RESPIRATORY: Negative for cough, wheezing CARDIOVASCULAR: Negative for chest pain, leg swelling, palpitations, orthopnea GI: SEE HPI MUSCULOSKELETAL: Negative for joint pain or swelling, back pain, and muscle pain. SKIN: Negative for lesions, rash HEMATOLOGY Negative for prolonged bleeding, bruising easily, and swollen nodes. ENDOCRINE: Negative for cold or heat intolerance, polyuria, polydipsia and goiter. NEURO: negative for tremor, gait imbalance, syncope and seizures. The remainder of the review of systems is noncontributory.   Physical Exam: BP 131/80 (BP Location: Left Arm, Patient Position: Sitting, Cuff Size: Large)   Pulse 86   Temp 98 F (36.7 C) (Oral)   Ht 6' (1.829 m)   Wt 184 lb 3.2 oz (83.6 kg)   BMI 24.98 kg/m  GENERAL: The patient is AO x3, in no acute distress. HEENT: Head is normocephalic and atraumatic. EOMI are intact. Mouth is well hydrated and without lesions. NECK: Supple. No masses LUNGS: Clear to auscultation. No presence of rhonchi/wheezing/rales. Adequate chest expansion HEART: RRR, normal s1 and s2. ABDOMEN: Soft, nontender, no guarding, no peritoneal signs, and nondistended. BS +. No masses. EXTREMITIES: Without any cyanosis, clubbing, rash, lesions or edema. NEUROLOGIC: AOx3, no focal motor deficit. SKIN: no jaundice, no rashes   Imaging/Labs: as above     Latest Ref Rng & Units 05/31/2023   11:56 AM 05/02/2023    4:28 AM 05/01/2023    6:14 AM  CBC  WBC 3.4 - 10.8 x10E3/uL 3.8  6.8    Hemoglobin 13.0 - 17.7 g/dL 16.1  09.6  04.5   Hematocrit 37.5 - 51.0 % 40.3  34.5  34.2   Platelets 150 - 450 x10E3/uL 187  157     Lab  Results  Component Value Date   FERRITIN 198 04/19/2019    I personally reviewed and interpreted the available labs, imaging and endoscopic files.  CT abdomen pelvis 04/30/2023  1. Nonobstructive punctate left nephrolithiasis. 2. Colonic diverticulosis with no acute diverticulitis. 3. Stable infrarenal abdominal aorta. Recommend follow-up ultrasound every 3 years. This recommendation follows ACR consensus guidelines: White Paper of the ACR Incidental Findings Committee II on Vascular Findings. J Am Coll Radiol 2013; 40:981-191. 4.  Aortic Atherosclerosis (ICD10-I70.0).  5. No acute intra-intrapelvic abnormality with limited evaluation on this noncontrast study.  06/2023  IMPRESSION: Previous pulmonary emboli are actually decreased from previous. There is some minimal web like areas in the right lung base which could be subacute to chronic. No new large or central embolus. Breathing motion.   Advanced emphysematous lung changes.   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   There is a enlarged solitary node identified along the gastrohepatic ligament of uncertain etiology and significance. Additional evaluation can be considered when clinically appropriate.  Impression and Plan:  Patient is a 87 year old male with history of CAD, esophagitis, dyslipidemia, DVT/PE on Eliquis, COPD, GERD hematochezia presumed from diverticular bleed is here for follow-up after recent upper endoscopy, with distal esophageal narrowing status post balloon dilation with resolution of dysphagia and gastric biopsies normal  #Dysphagia-resolved  Patient had intermittent solid food dysphagia which has resolved now.  Only had 1 episode when he was eating large amount of baking at one time quickly.  At this time we have treated the benign distal esophageal stricture with balloon dilation with good therapeutic effect.  Discussed the result of gastric biopsies which were normal as patient failed  to receive the pathology results we have mailed him.  -Aspiration precautions - Cut food in small pieces and chew food thoroughly to avoid regurgitation episodes. - Discussed avoidance of eating within 3 hours of lying down to sleep and benefit of blocks to elevate head of bed. Also, will benefit from avoiding carbonated drinks/sodas or food that has tomatoes, spicy or greasy food.   # Satiety/weight loss and abdominal pain-resolved  Patient now has adequate diet and has documented 4 pound weight gain since last clinic appointment  Had a recent CT abdomen with IV contrast with no signs of malignancy and normal pancreas  No further blood per rectum and patient likely had diverticular bleed which was self resolved  He fairly had a recent good quality colonoscopy 22 and less likely of having a lower GI malignancy or lesion  At this time we have a negative upper endoscopy, negative colonoscopy 2 years ago and a recent CT abdomen without any signs of any malignancy.  Patient was reassured  To follow-up in GI clinic as needed  All questions were answered.      Vista Lawman, MD Gastroenterology and Hepatology [facility]    This chart has been completed using [facility] Dictation software, and while attempts have been made to ensure accuracy , certain words and phrases may not be transcribed as intended

## 2023-07-19 DIAGNOSIS — S29012A Strain of muscle and tendon of back wall of thorax, initial encounter: Secondary | ICD-10-CM | POA: Diagnosis not present

## 2023-07-19 DIAGNOSIS — T148XXA Other injury of unspecified body region, initial encounter: Secondary | ICD-10-CM | POA: Diagnosis not present

## 2023-07-21 ENCOUNTER — Other Ambulatory Visit: Payer: Self-pay | Admitting: Cardiology

## 2023-07-26 ENCOUNTER — Telehealth: Payer: Self-pay | Admitting: Cardiology

## 2023-07-26 MED ORDER — ATORVASTATIN CALCIUM 80 MG PO TABS: 80.0000 mg | ORAL_TABLET | Freq: Every day | ORAL | 2 refills | Status: AC

## 2023-07-26 NOTE — Telephone Encounter (Signed)
Spoke with wife to verify the dosage to make sure correct medication. Verbalized understanding and medication sent

## 2023-07-26 NOTE — Telephone Encounter (Signed)
*  STAT* If patient is at the pharmacy, call can be transferred to refill team.   1. Which medications need to be refilled? (please list name of each medication and dose if known)  ATORVASTATIN CALCIUM PO  2. Would you like to learn more about the convenience, safety, & potential cost savings by using the [facility]? No     3. Are you open to using the [facility] (Type [facility]. No ).   4. Which pharmacy/location (including street and city if local pharmacy) is medication to be sent to? [id number] - [place], [place] - [address]    5. Do they need a 30 day or 90 day supply? 90

## 2023-08-25 DIAGNOSIS — I10 Essential (primary) hypertension: Secondary | ICD-10-CM | POA: Diagnosis not present

## 2023-08-25 DIAGNOSIS — E539 Vitamin B deficiency, unspecified: Secondary | ICD-10-CM | POA: Diagnosis not present

## 2023-08-25 DIAGNOSIS — N1831 Chronic kidney disease, stage 3a: Secondary | ICD-10-CM | POA: Diagnosis not present

## 2023-09-01 DIAGNOSIS — Z20822 Contact with and (suspected) exposure to covid-19: Secondary | ICD-10-CM | POA: Diagnosis not present

## 2023-09-01 DIAGNOSIS — R059 Cough, unspecified: Secondary | ICD-10-CM | POA: Diagnosis not present

## 2023-09-01 DIAGNOSIS — U071 COVID-19: Secondary | ICD-10-CM | POA: Diagnosis not present

## 2023-09-01 DIAGNOSIS — J441 Chronic obstructive pulmonary disease with (acute) exacerbation: Secondary | ICD-10-CM | POA: Diagnosis not present

## 2023-09-13 ENCOUNTER — Encounter: Payer: Self-pay | Admitting: *Deleted

## 2023-09-21 DIAGNOSIS — N1831 Chronic kidney disease, stage 3a: Secondary | ICD-10-CM | POA: Diagnosis not present

## 2023-09-21 DIAGNOSIS — I7143 Infrarenal abdominal aortic aneurysm, without rupture: Secondary | ICD-10-CM | POA: Diagnosis not present

## 2023-09-21 DIAGNOSIS — Z86718 Personal history of other venous thrombosis and embolism: Secondary | ICD-10-CM | POA: Diagnosis not present

## 2023-09-21 DIAGNOSIS — M6281 Muscle weakness (generalized): Secondary | ICD-10-CM | POA: Diagnosis not present

## 2023-09-21 DIAGNOSIS — R630 Anorexia: Secondary | ICD-10-CM | POA: Diagnosis not present

## 2023-09-21 DIAGNOSIS — I129 Hypertensive chronic kidney disease with stage 1 through stage 4 chronic kidney disease, or unspecified chronic kidney disease: Secondary | ICD-10-CM | POA: Diagnosis not present

## 2023-09-21 DIAGNOSIS — Z8719 Personal history of other diseases of the digestive system: Secondary | ICD-10-CM | POA: Diagnosis not present

## 2023-09-21 DIAGNOSIS — I700 Atherosclerosis of aorta: Secondary | ICD-10-CM | POA: Diagnosis not present

## 2023-09-21 DIAGNOSIS — M25561 Pain in right knee: Secondary | ICD-10-CM | POA: Diagnosis not present

## 2023-09-21 DIAGNOSIS — J449 Chronic obstructive pulmonary disease, unspecified: Secondary | ICD-10-CM | POA: Diagnosis not present

## 2023-09-21 DIAGNOSIS — J309 Allergic rhinitis, unspecified: Secondary | ICD-10-CM | POA: Diagnosis not present

## 2023-09-21 DIAGNOSIS — E539 Vitamin B deficiency, unspecified: Secondary | ICD-10-CM | POA: Diagnosis not present

## 2023-10-01 ENCOUNTER — Other Ambulatory Visit: Payer: Self-pay | Admitting: Cardiology

## 2023-10-13 ENCOUNTER — Telehealth: Payer: Self-pay

## 2023-10-13 ENCOUNTER — Other Ambulatory Visit: Payer: Self-pay

## 2023-10-13 DIAGNOSIS — J449 Chronic obstructive pulmonary disease, unspecified: Secondary | ICD-10-CM

## 2023-10-13 MED ORDER — YUPELRI 175 MCG/3ML IN SOLN: 175.0000 ug | Freq: Every day | RESPIRATORY_TRACT | 11 refills | Status: DC

## 2023-10-13 MED ORDER — FORMOTEROL FUMARATE 20 MCG/2ML IN NEBU: INHALATION_SOLUTION | RESPIRATORY_TRACT | 11 refills | Status: DC

## 2023-10-13 NOTE — Telephone Encounter (Signed)
Called and left  detailed message regarding patient regarding patient assistant form . Advised pt that we're faxing forms and pt can come by the office to pick up a copy of forms. Left contact info on vm.a copy forms were sent to be scan.

## 2023-10-20 IMAGING — DX DG CHEST 1V PORT
2 series · 2 of 2 positions shown · non-contrast
Comparison: 09/30/2021

CLINICAL DATA: Shortness of breath and hypertension

EXAM:
PORTABLE CHEST 1 VIEW

[chest ap (1 of 2)]
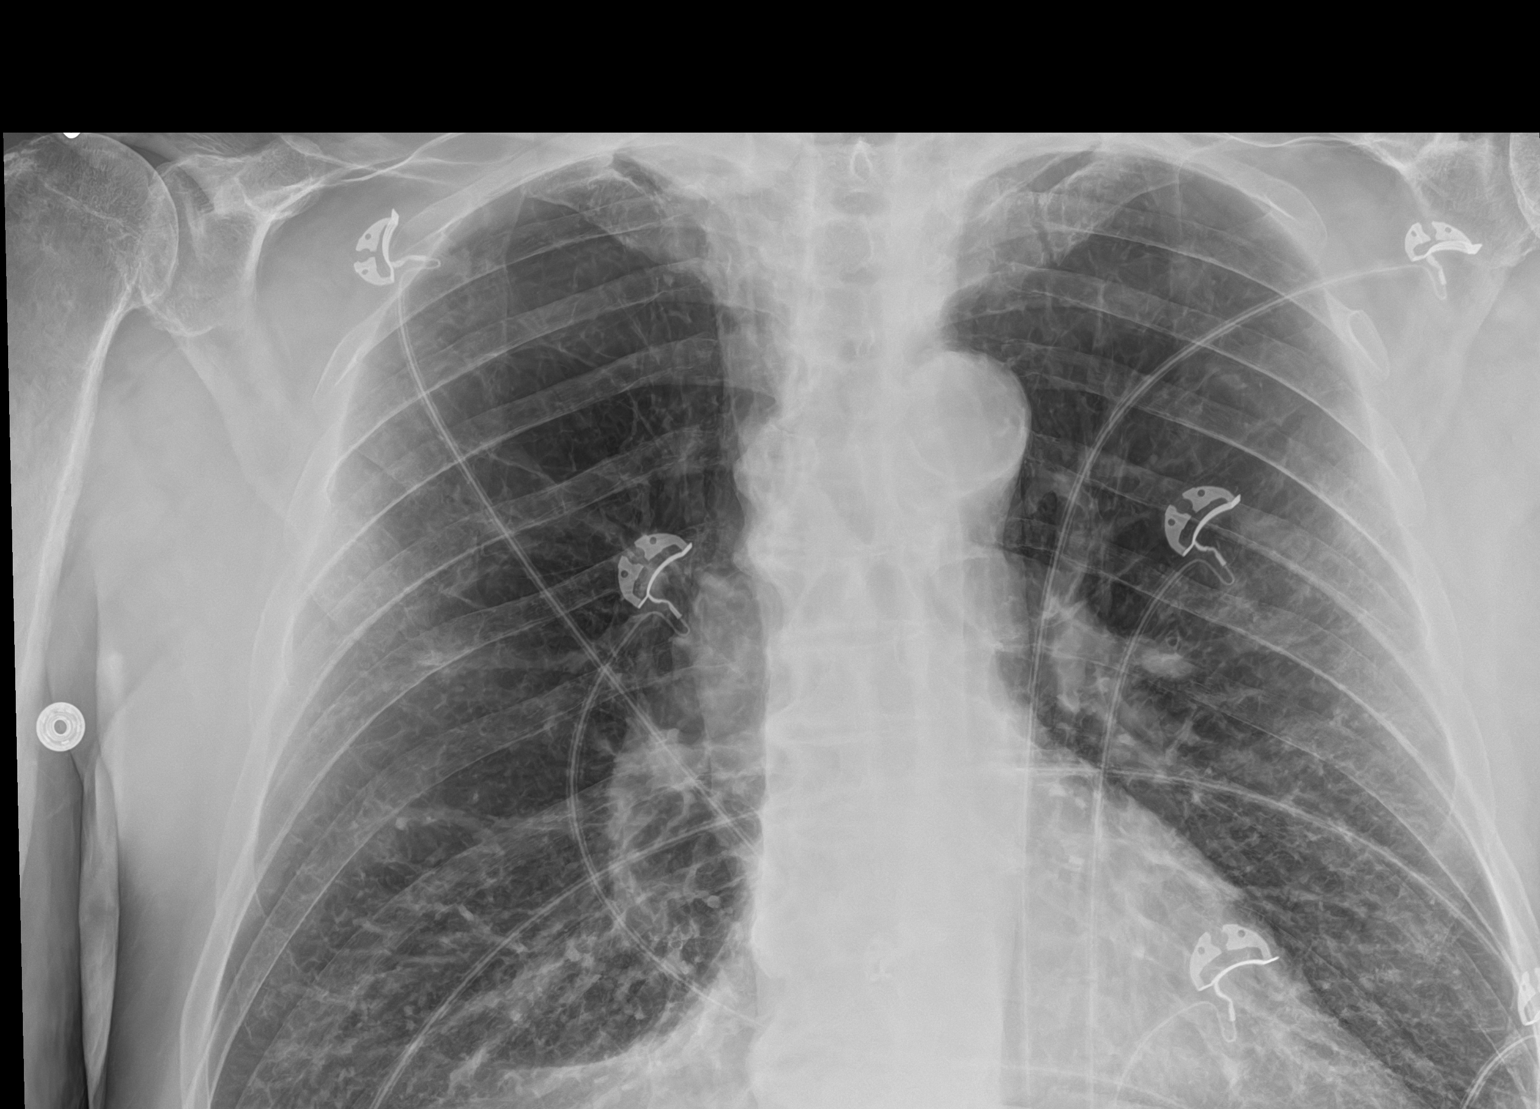

[chest ap (2 of 2)]
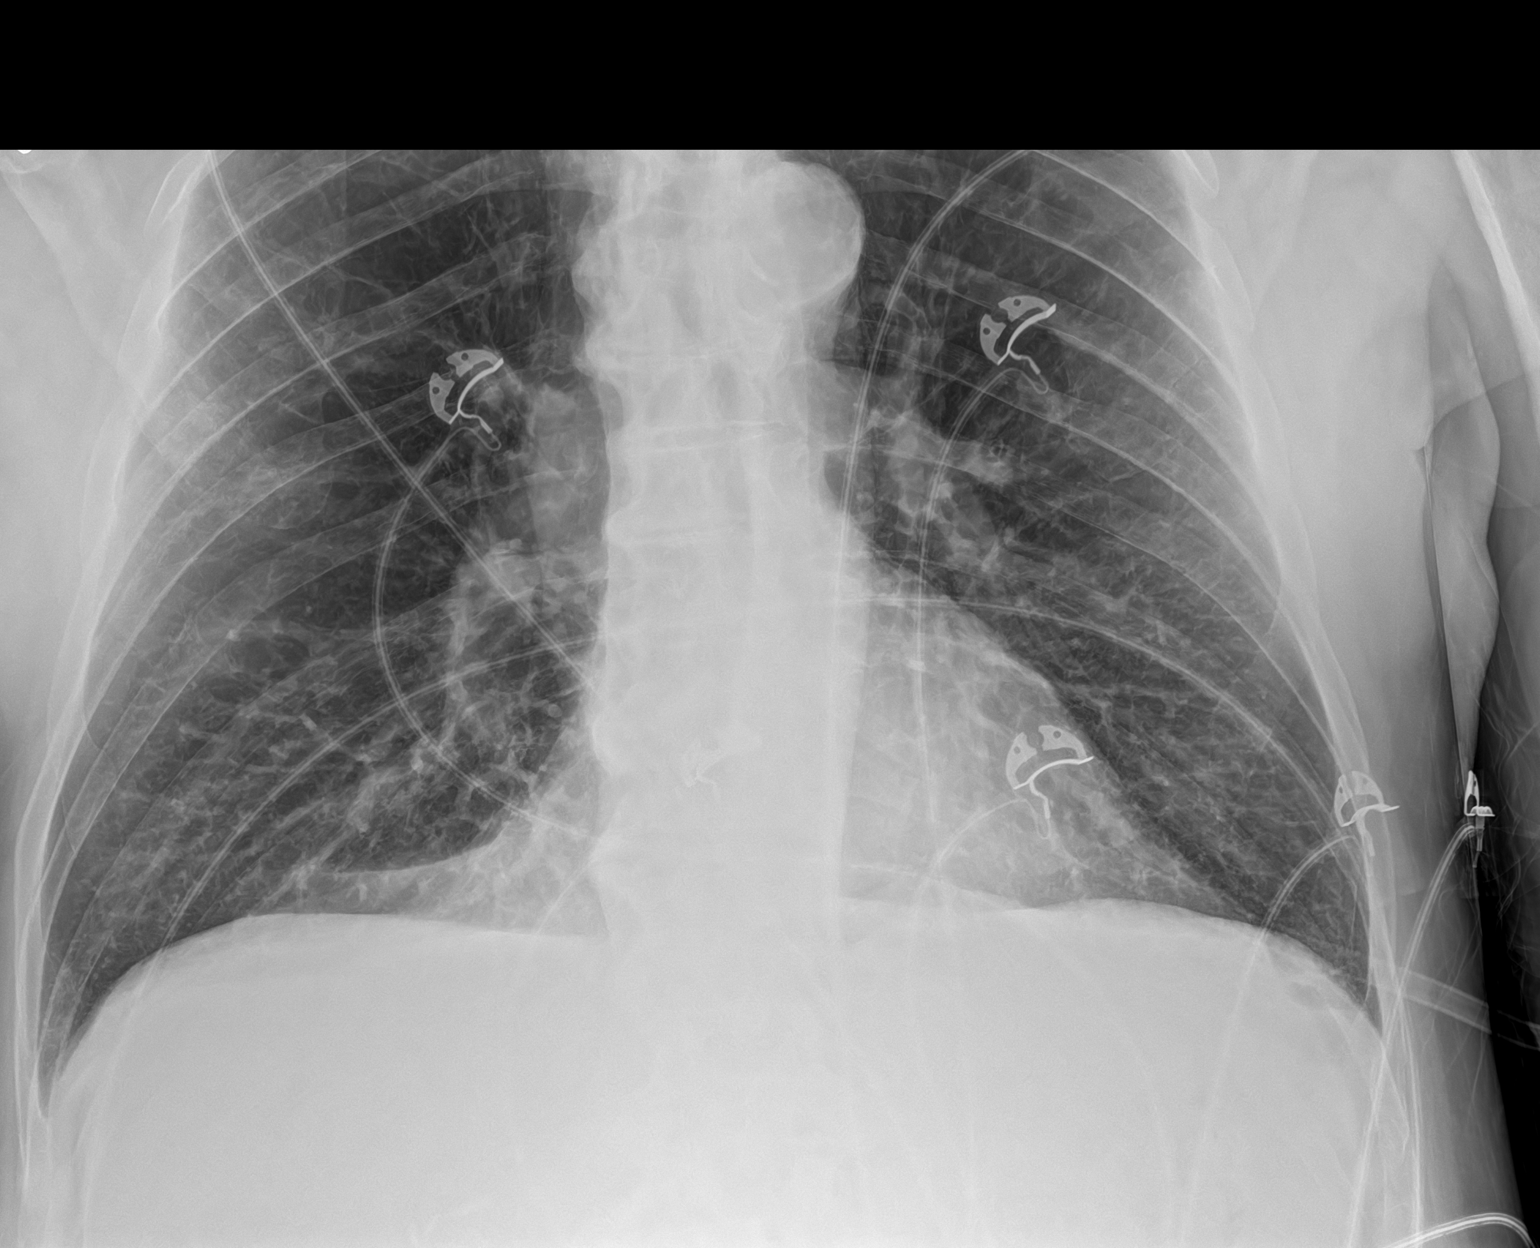

[2 of 2 positions shown; findings below may reference images not displayed]

FINDINGS: Emphysema noted. Atherosclerotic calcification of the aortic arch.
Thoracic spondylosis. Ill-defined nodularity in both mid lungs.
Small pulmonary nodules cannot be readily excluded. This could be
inflammatory but the ligament pulmonary nodule cannot be excluded
and CT chest is recommended for further characterization.

No blunting of the costophrenic angles. Heart size within normal
limits.
IMPRESSION: 1. Subtle mid lung nodularity bilaterally, cannot exclude
inflammatory or neoplastic pulmonary nodules. Chest CT is
recommended for further characterization.
2. Aortic Atherosclerosis (TJ8DP-K2B.B) and Emphysema (TJ8DP-YE2.X).
3. Thoracic spondylosis.

## 2023-10-27 ENCOUNTER — Ambulatory Visit: Payer: Medicare Other | Attending: Cardiology | Admitting: Cardiology

## 2023-10-27 ENCOUNTER — Encounter: Payer: Self-pay | Admitting: Cardiology

## 2023-10-27 VITALS — BP 118/70 | HR 88 | Ht 72.0 in | Wt 190.2 lb

## 2023-10-27 DIAGNOSIS — I2510 Atherosclerotic heart disease of native coronary artery without angina pectoris: Secondary | ICD-10-CM | POA: Diagnosis not present

## 2023-10-27 DIAGNOSIS — I10 Essential (primary) hypertension: Secondary | ICD-10-CM

## 2023-10-27 DIAGNOSIS — E782 Mixed hyperlipidemia: Secondary | ICD-10-CM | POA: Diagnosis not present

## 2023-10-27 MED ORDER — ASPIRIN 81 MG PO TBEC: 81.0000 mg | DELAYED_RELEASE_TABLET | Freq: Every day | ORAL | Status: DC

## 2023-10-27 NOTE — Patient Instructions (Signed)
Medication Instructions:   Eliquis removed from list today Begin Aspirin 81mg  daily  Continue all other medications.     Labwork:  none  Testing/Procedures:  none  Follow-Up:  6 months   Any Other Special Instructions Will Be Listed Below (If Applicable).   If you need a refill on your cardiac medications before your next appointment, please call your pharmacy.

## 2023-10-27 NOTE — Progress Notes (Signed)
Clinical Summary [name] is a 88 y.o.male seen for the following medical problems.    1. CAD - history of prior stenting to LAD - nuclear stress 03/2014 with no ischemia. LVEF 47%.  - admit 04/2016 with chest pain, negative EKG and enzymes. Symptoms better with belching, thought GI etiology. He had been off his protonix at the time. Symptoms improved after restarting.    - 03/2022 echo LVEF 50-55%, grade I dd,  - 03/2023 echo: LVEF 65-70%, poorly seen RV grossly normal   - no chest pain. Chronic SOB/DOE, always worst in the morning. Improves with nebulizer.    2. COPD - PFTs 10/2015 with moderate COPD 11/2021 admission with COPD exacerbation  - admit 03/2023 after COPD exacerbation.     3. Hyperlipidemia - he is on atorvastatin 80mg  daily - 03/2023 TC 113 TG 73 HDL 38 LDL 60 - 08/2023 TC 106 TG 69 HDL 37 LDL 54     4. History of GI bleed - admit 11/2017 with rectal bleeding. EGD showed esophagitis, gastritis but no active bleeding.  - admit 8/29024. From notes thought to be self limiting divericular bleed. - no recent issues   5. HTN - he is compliant with meds     6. Leg pain - no pain at rest. Pins and needles like pain, lower back pain No consistent with claudicatio   7. 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 Past Medical History:  Diagnosis Date   Abnormal CXR 4/20011   Chest  CT mode/serere COPD ( no mediastinal abnormality)   CAD (coronary artery disease) 2005   anterior MI with stent to the LAD in 1991 / stent 1996 / nuclear 2005, no ischemia   Carotid bruit    Doppler, June, 2013, 0-39% bilateral   COPD (chronic obstructive pulmonary disease) (HCC)    Diverticula of colon    Ejection fraction    55-60%, echo, April, 2011, could not estimate RV pressure   Esophagitis     HOH (hard of hearing)    Hyperlipidemia    Hypertension    Myocardial infarction (HCC)    x2   Shortness of breath    with exertion   Sleep apnea    Stop Bang score of 5   Tobacco abuse      Allergies  Allergen Reactions   Ibuprofen     Gi bleed      Current Outpatient Medications  Medication Sig Dispense Refill   Acetaminophen (TYLENOL 8 HOUR ARTHRITIS PAIN PO) Take 1 tablet by mouth every 8 (eight) hours as needed (pain).     albuterol (PROVENTIL HFA;VENTOLIN HFA) 108 (90 Base) MCG/ACT inhaler Inhale 1-2 puffs into the lungs every 6 (six) hours as needed for wheezing or shortness of breath. (Patient not taking: Reported on 07/05/2023)     apixaban (ELIQUIS) 5 MG TABS tablet Take 1 tablet (5 mg total) by mouth 2 (two) times daily.     atorvastatin (LIPITOR) 80 MG tablet Take 1 tablet (80 mg total) by mouth at bedtime. 90 tablet 2   dextromethorphan-guaiFENesin (MUCINEX DM) 30-600 MG 12hr tablet Take 1 tablet by mouth 2 (two) times daily. 20 tablet 0   formoterol (PERFOROMIST) 20 MCG/2ML nebulizer solution One vial every 12 hours 120  mL 11   levocetirizine (XYZAL) 5 MG tablet Take 5 mg by mouth every evening.     losartan (COZAAR) 100 MG tablet Take 100 mg by mouth daily.     Menthol, Topical Analgesic, (ICY HOT) 7.5 % (Roll) MISC Apply 1 each topically as needed (pain).     nitroGLYCERIN (NITROSTAT) 0.4 MG SL tablet PLACE 1 TABLET UNDER THE TONGUE EVERY 5 MINUTES AS NEEDED FOR CHEST PAIN. MAX 3 TABLETS PER EVENT. CALL 911 25 tablet 3   pantoprazole (PROTONIX) 40 MG tablet TAKE 1 TABLET(40 MG) BY MOUTH TWICE DAILY 180 tablet 3   revefenacin (YUPELRI) 175 MCG/3ML nebulizer solution Take 3 mLs (175 mcg total) by nebulization daily. 90 mL 11   No current facility-administered medications for this visit.     Past Surgical History:  Procedure Laterality Date   APPENDECTOMY     BIOPSY  11/17/2017   Procedure: BIOPSY;  Surgeon: Malissa Hippo, MD;  [facility];   Service: Endoscopy;;  gastric   BIOPSY  06/11/2023   Procedure: BIOPSY;  Surgeon: Franky Macho, MD;  [facility]: [facility];  Service: Endoscopy;;   CARDIAC CATHETERIZATION     CATARACT EXTRACTION Left    CATARACT EXTRACTION W/PHACO Right 01/22/2014   Procedure: CATARACT EXTRACTION PHACO AND INTRAOCULAR LENS PLACEMENT (IOC);  Surgeon: Gemma Payor, MD;  Location: [facility];  Service: Ophthalmology;  Laterality: Right;  CDE 7.46   CHOLECYSTECTOMY     8 years ago [facility]   COLONOSCOPY  10/05/2012   Dr. Karilyn Cota: multiple diverticula at sigmoid colon, multiple polyps (tubular adenomas), small external hemorrhoids   COLONOSCOPY N/A 11/18/2017   Procedure: COLONOSCOPY;  Surgeon: Malissa Hippo, MD;  [facility];  Service: Endoscopy;  Laterality: N/A;   COLONOSCOPY WITH PROPOFOL N/A 05/07/2021   Procedure: COLONOSCOPY WITH PROPOFOL;  Surgeon: Malissa Hippo, MD;  [facility];  Service: Endoscopy;  Laterality: N/A;   CYST REMOVAL HAND     right wrist   ESOPHAGEAL DILATION N/A 05/23/2015   Procedure: ESOPHAGEAL DILATION;  Surgeon: Corbin Ade, MD;  [facility];  Service: Endoscopy;  Laterality: N/A;   ESOPHAGEAL DILATION N/A 11/17/2017   Procedure: ESOPHAGEAL DILATION;  Surgeon: Malissa Hippo, MD;  [facility];  Service: Endoscopy;  Laterality: N/A;   ESOPHAGEAL DILATION N/A 05/07/2021   Procedure: ESOPHAGEAL DILATION;  Surgeon: Malissa Hippo, MD;  [facility];  Service: Endoscopy;  Laterality: N/A;   ESOPHAGOGASTRODUODENOSCOPY N/A 05/23/2015   Dr. Jena Gauss: Ulcerative/ erosive reflux esophagitis with peptic stricture formation. Status post dilation as described. Hiatal hernia. Abnormal gastric mucosa of uncertain significance status post gastric biospy. +H.pylori   ESOPHAGOGASTRODUODENOSCOPY N/A 11/17/2017   Procedure: ESOPHAGOGASTRODUODENOSCOPY (EGD);  Surgeon: Malissa Hippo, MD;  [facility];  Service: Endoscopy;  Laterality: N/A;    ESOPHAGOGASTRODUODENOSCOPY (EGD) WITH PROPOFOL N/A 05/07/2021   Procedure: ESOPHAGOGASTRODUODENOSCOPY (EGD) WITH PROPOFOL;  Surgeon: Malissa Hippo, MD;  [facility];  Service: Endoscopy;  Laterality: N/A;   ESOPHAGOGASTRODUODENOSCOPY (EGD) WITH PROPOFOL N/A 06/11/2023   Procedure: ESOPHAGOGASTRODUODENOSCOPY (EGD) WITH PROPOFOL;  Surgeon: Franky Macho, MD;  [facility];  Service: Endoscopy;  Laterality: N/A;  8:45AM;ASA 3   PERCUTANEOUS STENT INTERVENTION     SKIN CANCER EXCISION  2015   ear & neck   TONSILLECTOMY       Allergies  Allergen Reactions   Ibuprofen     Gi bleed  Family History  Problem Relation Age of Onset   Heart disease Mother    Heart disease Father    Heart disease Brother    Stomach cancer Brother    Colon cancer Neg Hx      Social History [name] reports that he quit smoking about 35 years ago. His smoking use included cigarettes. He started smoking about 40 years ago. He has a 36 pack-year smoking history. His smokeless tobacco use includes chew. [name] reports no history of alcohol use.    Physical Examination Today's Vitals   10/27/23 0948  BP: 118/70  Pulse: 88  Weight: 190 lb 3.2 oz (86.3 kg)  Height: 6' (1.829 m)   Body mass index is 25.8 kg/m.  Gen: resting comfortably, no acute distress HEENT: no scleral icterus, pupils equal round and reactive, no palptable cervical adenopathy,  CV: RRR, no mrg, no jvd Resp: Clear to auscultation bilaterally GI: abdomen is soft, non-tender, non-distended, normal bowel sounds, no hepatosplenomegaly MSK: extremities are warm, no edema.  Skin: warm, no rash Neuro:  no focal deficits Psych: appropriate affect   Diagnostic Studies  03/2022 echo 1. Left ventricular ejection fraction, by estimation, is approximately  55%. The left ventricle has normal function. The left ventricle has no  regional wall motion abnormalities. Left ventricular diastolic  parameters  are consistent with Grade I diastolic   dysfunction (impaired relaxation). The average left ventricular global  longitudinal strain is -18.0 %. The global longitudinal strain is normal.   2. Right ventricular systolic function is normal. The right ventricular  size is normal. There is normal pulmonary artery systolic pressure. The  estimated right ventricular systolic pressure is 11.6 mmHg.   3. The mitral valve is grossly normal. Trivial mitral valve  regurgitation.   4. The aortic valve is tricuspid. Aortic valve regurgitation is not  visualized. Aortic valve sclerosis is present, with no evidence of aortic  valve stenosis.   5. The inferior vena cava is normal in size with greater than 50%  respiratory variability, suggesting right atrial pressure of 3 mmHg.    Assessment and Plan   1. CAD - no chest pains - chronic SOB better with nebulizer consistent with his COPD - pcp has stopped eliquis, we will restart his ASA 81mg  daily. Had been on prior for years    2. Hyperlipidemia -lipids are at goal, continue current meds     3. HTN - at goal, continue current meds     Antoine Poche, M.D.

## 2023-10-28 ENCOUNTER — Encounter: Payer: Self-pay | Admitting: *Deleted

## 2023-11-08 DIAGNOSIS — J449 Chronic obstructive pulmonary disease, unspecified: Secondary | ICD-10-CM | POA: Diagnosis not present

## 2023-11-08 DIAGNOSIS — Z9981 Dependence on supplemental oxygen: Secondary | ICD-10-CM | POA: Diagnosis not present

## 2023-11-08 DIAGNOSIS — Z87891 Personal history of nicotine dependence: Secondary | ICD-10-CM | POA: Diagnosis not present

## 2023-12-04 NOTE — Progress Notes (Signed)
 Jeremy [name], male    DOB: [birth year],    MRN: [id number]   Brief patient profile:  51 yowm quit smoking 1990 p MI  referred to pulmonary clinic in [place]  10/13/2021 by Dr Del Favia for copd eval with Nov 2022 > ER with dx aecopd with GOLD 2 criteria in 2017 but not requiring any maint rx up until Nov 2022     History of Present Illness  10/13/2021  Pulmonary/ 1st office eval/ Jeremy [name] / [name] / on ACEi/breztri   Chief Complaint  Patient presents with   Consult    Patient had covid in 2019. Patient states that he does feel better now. States that he really needed to be seen a month ago but is better now. States that he is able to walk longer distance now than before.   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 Much worse in am's historically but better now  Cough: none but still urge to clear his throat Sleep: bed blocks/ one pillow SABA use: much less now breztri  one bid / maybe one albuterol  hfa/ neb one each am  Rec Ok to continue lisinopril  but it may be part of the problem with your throat (clearing, choking, gagging, something  stuck, problem swallowing)   Plan A = Automatic = Always=    Breztri  Take 1-2 puffs first thing in am and then another 2 puffs about 12 hours later.   Work on inhaler technique:   Plan B = Backup (to supplement plan A, not to replace it)Only use your albuterol  inhaler as a rescue medication to be used if you can't catch your breath by resting or doing a relaxed purse lip pattern.  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 Cough: min rattling daytime/ no purulent sputum  Sleeping: better at hs on bed blocks  SABA use: alb q am (was supposed to be prn  02: not using  Rec Plan A = Automatic = Always=    performist/yupelri  each am and performist 12 hours later  Plan B = Backup (to supplement plan A, not to replace it) Only use your albuterol  nebulizer as a rescue medication Plan C = Crisis (instead of Plan B but only if Plan B stops working) Prednisone  10 mg take  4 each am x 2 days,   2 each am x 2 days,  1 each am x 2 days and stop  Ok to use Mucinex  dm 1200 mg every 12 hours as needed for cough  Ok to use 02 as needed but goal is to keep the oxygen  meter over 90%   Please keep  follow up office  but call sooner if needed  - bring  all medications/ solutions    06/08/2023  f/u ov/[place] office/Jeremy [name] re: GOLD 2 copd  maint on yupelri  / bud  did  bring meds / still on xarelto for dvt/PE rx per  Dr Quentin Brunner office  Chief Complaint  Patient presents with   Follow-up    3 month follow up   Dyspnea:  limited more by leg weakness  than breathing  Cough: none  Sleeping: bed blocks and 1 pillow s resp cc  SABA use: none  02: not using   Rec For any flare of cough wheeze short of breath add budesonide  0.25 mg twice daily until better for a whole week     12/07/2023  f/u ov/[place] office/Jeremy [name] re: GOLD 2 COPD  maint on yupelri /performist  rarely using budesonide   Chief Complaint  Patient presents with   Follow-up    Hasn't notice any change in breathing since last visit, having some coughing with mucus , no longer taking aspirin  per cardiology because blood in stool   Dyspnea:  no change 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    Also denies any obvious fluctuation of symptoms with weather or environmental changes or other aggravating or alleviating factors except as outlined above   No unusual exposure hx or h/o childhood pna/ asthma or knowledge of premature birth.  Current Allergies, Complete Past Medical History, Past Surgical History, Family History, and Social History were reviewed in [employer] record.  ROS  The following are not active complaints unless bolded Hoarseness, sore throat, dysphagia, dental problems, itching, sneezing,  nasal congestion or discharge of excess mucus or purulent secretions, ear ache,   fever, chills, sweats, unintended wt loss or wt gain, classically pleuritic or exertional cp,  orthopnea pnd or arm/hand swelling  or leg swelling, presyncope, palpitations, abdominal pain, anorexia, nausea, vomiting, diarrhea  or change in bowel habits or change in bladder habits, change in stools or change in urine, dysuria, hematuria,  rash, arthralgias, visual complaints, headache, numbness, weakness or ataxia or problems with walking or coordination,  change in mood or  memory.        Current Meds  Medication Sig   Acetaminophen  (TYLENOL  8 HOUR PO) Take by mouth. Pt is taking 500mg    atorvastatin  (LIPITOR) 80 MG tablet Take 1 tablet (80 mg total) by mouth at bedtime.   formoterol  (PERFOROMIST ) 20 MCG/2ML nebulizer solution One vial every 12 hours   hydrOXYzine  (ATARAX ) 25 MG tablet Take 25 mg by mouth at bedtime as needed.   ipratropium-albuterol  (DUONEB) 0.5-2.5 (3) MG/3ML SOLN Take 3 mLs by nebulization every 4 (four) hours as needed.   losartan  (COZAAR ) 100 MG tablet Take 100 mg by mouth daily.   nitroGLYCERIN  (NITROSTAT ) 0.4 MG SL tablet PLACE 1 TABLET UNDER THE TONGUE EVERY 5 MINUTES AS NEEDED FOR CHEST PAIN. MAX 3 TABLETS PER EVENT. CALL 911   pantoprazole  (PROTONIX )  40 MG tablet TAKE 1 TABLET(40 MG) BY MOUTH TWICE DAILY   revefenacin  (YUPELRI ) 175 MCG/3ML nebulizer solution Take 3 mLs (175 mcg total) by nebulization daily.   [DISCONTINUED] acetaminophen  (TYLENOL ) 650 MG CR tablet Take 650 mg by mouth every 8 (eight) hours as needed for pain.   [DISCONTINUED] aspirin  EC 81 MG tablet Take 1 tablet (81 mg total) by mouth daily. Swallow whole.  Past Medical History:  Diagnosis Date   Abnormal CXR 4/20011   Chest  CT mode/serere COPD ( no mediastinal abnormality)   CAD (coronary artery disease) 2005   anterior MI with stent to the LAD in 1991 / stent 1996 / nuclear 2005, no ischemia   Carotid bruit    Doppler, June, 2013, 0-39% bilateral   COPD (chronic obstructive pulmonary disease) (HCC)    Diverticula of colon    Ejection fraction    55-60%, echo, April, 2011, could not estimate RV pressure   Esophagitis    HOH (hard of hearing)    Hyperlipidemia    Hypertension    Myocardial infarction (HCC)    x2   Shortness of breath    with exertion   Sleep apnea    Stop Bang score of 5   Tobacco abuse         Objective:    Wts  12/07/2023       186  06/08/2023       180  04/08/2023       186  02/17/2023         187 01/18/2023         190  11/03/2022       190   08/05/2022     193 02/11/2022       187   12/05/21 185 lb 9.6 oz (84.2 kg)  11/22/21 187 lb (84.8 kg)  10/13/21 187 lb (84.8 kg)    Vital signs reviewed  12/07/2023  - Note at rest 02 sats  93% on RA   General appearance:    amb wm/ hard of hearing / mild pseudo wheeze resolves with purse lip breathing   HEENT : Oropharynx  clear  Nasal turbinates nl    NECK :  without  apparent JVD/ palpable Nodes/TM    LUNGS: no acc muscle use,  Mild barrel  contour chest wall with bilateral  Distant bs s audible wheeze and  without cough on insp or exp maneuvers  and mild  Hyperresonant  to  percussion bilaterally     CV:  RRR  no s3 or murmur or increase in P2, and no edema   ABD:  soft  and nontender with pos end  insp Hoover's  in the supine position.  No bruits or organomegaly appreciated   MS:  Nl gait/ ext warm without deformities Or obvious joint restrictions  calf tenderness, cyanosis or clubbing     SKIN: warm and dry without lesions    NEURO:  alert, approp, nl sensorium with  no motor or cerebellar deficits apparent.       CXR PA and Lateral:   12/07/2023 :    I personally reviewed images and impression is as follows:     Mild T kyphosis/ copd changes/ nothing acute   Labs ordered/ reviewed:    Cbc/ bmet ok  with hbg 12.6  and HC03  324   Lab Results  Component Value Date   DDIMER 0.51 (H) 12/07/2023      Lab Results  Component Value Date   TSH 3.960 12/07/2023      BNP  12/07/2023    35   No results found for: "ESRSEDRATE"          Assessment

## 2023-12-07 ENCOUNTER — Ambulatory Visit (HOSPITAL_COMMUNITY)
Admission: RE | Admit: 2023-12-07 | Discharge: 2023-12-07 | Disposition: A | Discharge status: Home / Self Care | Source: Ambulatory Visit | Attending: Internal Medicine | Admitting: Internal Medicine

## 2023-12-07 ENCOUNTER — Encounter: Payer: Self-pay | Admitting: Internal Medicine

## 2023-12-07 ENCOUNTER — Ambulatory Visit: Payer: Medicare Other | Admitting: Internal Medicine

## 2023-12-07 VITALS — BP 98/63 | HR 92 | Temp 97.9°F | Ht 72.0 in | Wt 186.8 lb

## 2023-12-07 DIAGNOSIS — R0602 Shortness of breath: Secondary | ICD-10-CM | POA: Diagnosis not present

## 2023-12-07 DIAGNOSIS — J449 Chronic obstructive pulmonary disease, unspecified: Secondary | ICD-10-CM | POA: Diagnosis not present

## 2023-12-07 DIAGNOSIS — Z955 Presence of coronary angioplasty implant and graft: Secondary | ICD-10-CM | POA: Diagnosis not present

## 2023-12-07 DIAGNOSIS — R0609 Other forms of dyspnea: Secondary | ICD-10-CM | POA: Diagnosis not present

## 2023-12-07 DIAGNOSIS — I219 Acute myocardial infarction, unspecified: Secondary | ICD-10-CM | POA: Diagnosis not present

## 2023-12-07 NOTE — Patient Instructions (Addendum)
 Please remember to go to the lab department   for your tests - we will call you with the results when they are available.     Please remember to go to the  x-ray department  @  [facility] for your tests - we will call you with the results when they are available     Make sure you check your oxygen saturation  AT  your highest level of activity (not after you stop)   to be sure it stays over 90% and adjust  02 flow upward to maintain this level if needed but remember to turn it back to previous settings when you stop (to conserve your supply).   Please schedule a follow up office visit in 6 weeks, call sooner if needed with all medications /inhalers/ solutions in hand so we can verify exactly what you are taking. This includes all medications from all doctors and over the counters

## 2023-12-09 ENCOUNTER — Encounter: Payer: Self-pay | Admitting: Internal Medicine

## 2023-12-09 NOTE — Assessment & Plan Note (Addendum)
 Echo 03/17/23  TDS but no abn findings  - 12/07/2023 unsteady on feet but no sob or desats p 150 ft mod pace walking   Suspect multifactorial but really not ventilatory limited at this point as much as geriactric decline/ deconditioning   DOE labs ok   Each maintenance medication was reviewed in detail including emphasizing most importantly the difference between maintenance and prns and under what circumstances the prns are to be triggered using an action plan format where appropriate.  Total time for H and P, chart review, counseling, reviewing neb device(s) , directly observing portions of ambulatory 02 saturation study/ and generating customized AVS unique to this office visit / same day charting = 34 min

## 2023-12-09 NOTE — Assessment & Plan Note (Signed)
 Quit smoking 1990  - Spirometry 11/08/15   FEV1 1.89 (58%)  Ratio 0.49 p 10% improvement from saba  - 10/13/2021  After extensive coaching inhaler device,  effectiveness =    75% (short Ti)  - d/c acei 11/22/21 > all wheeze resolve by 12/05/2021 ov - PFT's  12/03/21  FEV1 1.76 (58 % ) ratio 0.42  p 10 % improvement from saba p 0 prior to study with DLCO  14.04 (55%) corrects to 2.66  (69%)  for alv volume and FV curve classically concave    - 11/03/2022  After extensive coaching inhaler device,  effectiveness =    80% (slt delayed trigger) - 01/18/2023 flared on breztri/  "neb is the only thing that helps"  with assoc throat clearing ? Adverse effects of hfa so rec change to neb formoterol/bud   - 02/17/2023 added yupelri  - 04/08/2023 added prn pred x 6 d and if needing freq next step is add budesonide to neb  - 04/08/2023   Walked on RA  x  2  lap(s) =  approx 300  ft  @  mod pace, stopped due to sob with lowest 02 sats 89%   - 12/07/2023   Walked on RA  x  lap(s) =  approx 150  ft  @ mod pace/ akward gait, stopped due to became "tired" unsteady on feet  with lowest 02 sats 93% and no sob    Pt is Group B in terms of symptom/risk and laba/lama therefore appropriate rx at this point >>>  continue performist/ yupelri  no need for budesonide unless exacerbating

## 2023-12-13 LAB — CBC WITH DIFFERENTIAL/PLATELET
Basophils Absolute: 0 10*3/uL (ref 0.0–0.2)
Basos: 1 %
EOS (ABSOLUTE): 0.2 10*3/uL (ref 0.0–0.4)
Eos: 4 %
Hematocrit: 40.1 % (ref 37.5–51.0)
Hemoglobin: 12.6 g/dL — ABNORMAL LOW (ref 13.0–17.7)
Immature Grans (Abs): 0 10*3/uL (ref 0.0–0.1)
Immature Granulocytes: 0 %
Lymphocytes Absolute: 1.1 10*3/uL (ref 0.7–3.1)
Lymphs: 27 %
MCH: 26.1 pg — ABNORMAL LOW (ref 26.6–33.0)
MCHC: 31.4 g/dL — ABNORMAL LOW (ref 31.5–35.7)
MCV: 83 fL (ref 79–97)
Monocytes Absolute: 0.3 10*3/uL (ref 0.1–0.9)
Monocytes: 8 %
Neutrophils Absolute: 2.5 10*3/uL (ref 1.4–7.0)
Neutrophils: 60 %
Platelets: 173 10*3/uL (ref 150–450)
RBC: 4.83 x10E6/uL (ref 4.14–5.80)
RDW: 14.6 % (ref 11.6–15.4)
WBC: 4.1 10*3/uL (ref 3.4–10.8)

## 2023-12-13 LAB — BASIC METABOLIC PANEL WITH GFR
BUN/Creatinine Ratio: 9 — ABNORMAL LOW (ref 10–24)
BUN: 11 mg/dL (ref 8–27)
CO2: 24 mmol/L (ref 20–29)
Calcium: 9.1 mg/dL (ref 8.6–10.2)
Chloride: 105 mmol/L (ref 96–106)
Creatinine, Ser: 1.29 mg/dL — ABNORMAL HIGH (ref 0.76–1.27)
Glucose: 99 mg/dL (ref 70–99)
Potassium: 3.9 mmol/L (ref 3.5–5.2)
Sodium: 147 mmol/L — ABNORMAL HIGH (ref 134–144)
eGFR: 54 mL/min/{1.73_m2} — ABNORMAL LOW (ref >59)

## 2023-12-13 LAB — ALPHA-1-ANTITRYPSIN PHENOTYP: A-1 Antitrypsin: 155 mg/dL (ref 101–187)

## 2023-12-13 LAB — D-DIMER, QUANTITATIVE: D-DIMER: 0.51 mg{FEU}/L — ABNORMAL HIGH (ref 0.00–0.49)

## 2023-12-13 LAB — TSH: TSH: 3.96 u[IU]/mL (ref 0.450–4.500)

## 2023-12-13 LAB — BRAIN NATRIURETIC PEPTIDE: BNP: 35.3 pg/mL (ref 0.0–100.0)

## 2023-12-19 ENCOUNTER — Other Ambulatory Visit: Payer: Self-pay | Admitting: Internal Medicine

## 2023-12-19 DIAGNOSIS — J449 Chronic obstructive pulmonary disease, unspecified: Secondary | ICD-10-CM

## 2024-01-05 ENCOUNTER — Ambulatory Visit (INDEPENDENT_AMBULATORY_CARE_PROVIDER_SITE_OTHER): Admitting: Gastroenterology

## 2024-01-05 ENCOUNTER — Encounter: Payer: Self-pay | Admitting: Gastroenterology

## 2024-01-05 VITALS — BP 158/78 | HR 74 | Temp 98.4°F | Ht 72.0 in | Wt 186.0 lb

## 2024-01-05 DIAGNOSIS — K529 Noninfective gastroenteritis and colitis, unspecified: Secondary | ICD-10-CM | POA: Insufficient documentation

## 2024-01-05 NOTE — Progress Notes (Signed)
 GI Office Note    Referring Provider: Omie Bickers, MD Primary Care Physician:  Omie Bickers, MD  Primary Gastroenterologist: Dr. Alita Irwin  Chief Complaint   Chief Complaint  Patient presents with   Change in bowels    Pt was having issues with diarrhea, abd pain and nausea for a couple of weeks. Symptoms resolved three days ago and pt has not had a bm since. Pt took a stool softener last night.     History of Present Illness   Jeremy [name] is a 88 y.o. male presenting today for for bowel issues, abdominal pain, nausea for a couple of weeks. Last seen 06/2023.   Today: Recent illness. Developed acute onset diarrhea, nausea, for 6-7 days. Another week of intermittent loose stools. No ill contacts, travel, recent antibiotics, change in diet or medications. Appetite remains somewhat poor. Abdominal discomfort has resolved. No blood in the stool or melena. At this point it has been 3-4 days without a BM. He did use Pepto twice during illness. At baseline he had bristol 4 stool daily to every other day.   Yesterday 24 hour recall: boiled eggs/toast, fried corn bread, potato salad, pinto beans. Drinks one cup of coffee daily. Water  and [facility].  Started taking colace yesterday, took one.   CT abdomen pelvis 04/30/2023  1. Nonobstructive punctate left nephrolithiasis. 2. Colonic diverticulosis with no acute diverticulitis. 3. Stable infrarenal abdominal aorta. Recommend follow-up ultrasound every 3 years. This recommendation follows ACR consensus guidelines: White Paper of the ACR Incidental Findings Committee II on Vascular Findings. J Am Coll Radiol 2013; 69:629-528. 4.  Aortic Atherosclerosis (ICD10-I70.0). 5. No acute intra-intrapelvic abnormality with limited evaluation on this noncontrast study.   CTA Chest10/2024  IMPRESSION: -Previous pulmonary emboli are actually decreased from previous. There is some minimal web like areas in the right lung base which could be  subacute to chronic. No new large or central embolus. Breathing motion. -Advanced emphysematous lung changes. 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 -There is a enlarged solitary node identified along the gastrohepatic ligament of uncertain etiology and significance. Additional evaluation can be considered when clinically appropriate.   Endoscopic history  05/2023  - Benign- appearing esophageal stenosis. Dilated. - 2 cm hiatal hernia. - Erythematous mucosa in the antrum. Biopsied. - Normal duodenal bulb and second portion of the duodenum.   No H. Pylori bacteria in stomach, or any early cancer changes to the stomach mucosa ( Intestinal metaplasia)    04/2021 Moderate distal esophageal stricture dilated with balloon dilated to 18 mm. Small sliding hiatal hernia. Normal examination stomach first and second part of duodenum.   Colonoscopy completed with ultraslim scope. Numerous diverticula at sigmoid colon with noncritical stricture at 30 cm from the anal margin traversed with ultraslim scope. No polyps or other abnormalities noted. Anal papillae    Medications   Current Outpatient Medications  Medication Sig Dispense Refill   Acetaminophen  (TYLENOL  8 HOUR PO) Take by mouth. Pt is taking 500mg      atorvastatin  (LIPITOR) 80 MG tablet Take 1 tablet (80 mg total) by mouth at bedtime. 90 tablet 2   formoterol  (PERFOROMIST ) 20 MCG/2ML nebulizer solution Substituted for: Perforomist  Solution Inhale one vial in nebulizer twice a day. 2 mL 11   hydrOXYzine  (ATARAX ) 25 MG tablet Take 25 mg by mouth at bedtime as needed.     ipratropium-albuterol  (DUONEB) 0.5-2.5 (3) MG/3ML SOLN Take 3 mLs by nebulization every 4 (four) hours as needed.  losartan  (COZAAR ) 100 MG tablet Take 100 mg by mouth daily.     nitroGLYCERIN  (NITROSTAT ) 0.4 MG SL tablet PLACE 1 TABLET UNDER THE TONGUE EVERY 5 MINUTES AS NEEDED FOR CHEST PAIN. MAX 3 TABLETS PER EVENT. CALL 911 25 tablet 3    pantoprazole  (PROTONIX ) 40 MG tablet TAKE 1 TABLET(40 MG) BY MOUTH TWICE DAILY 180 tablet 3   revefenacin  (YUPELRI ) 175 MCG/3ML nebulizer solution Take 3 mLs (175 mcg total) by nebulization daily. 90 mL 11   No current facility-administered medications for this visit.    Allergies   Allergies as of 01/05/2024 - Review Complete 01/05/2024  Allergen Reaction Noted   Ibuprofen   05/31/2023    Review of Systems   General: Negative for weight loss, fever, chills, fatigue, weakness. See hpi ENT: Negative for hoarseness, difficulty swallowing , nasal congestion. Hard of hearing CV: Negative for chest pain, angina, palpitations, dyspnea on exertion, peripheral edema.  Respiratory: Negative for dyspnea at rest, dyspnea on exertion, cough, sputum, wheezing.  GI: See history of present illness. GU:  Negative for dysuria, hematuria, urinary incontinence, urinary frequency, nocturnal urination.  Endo: Negative for unusual weight change.     Physical Exam   BP (!) 158/78 (BP Location: Right Arm, Patient Position: Sitting, Cuff Size: Normal)   Pulse 74   Temp 98.4 F (36.9 C) (Oral)   Ht 6' (1.829 m)   Wt 186 lb (84.4 kg)   SpO2 92%   BMI 25.23 kg/m    General: Well-nourished, well-developed in no acute distress. Accompanied by wife Eyes: No icterus. Mouth: Oropharyngeal mucosa moist and pink   Lungs: Clear to auscultation bilaterally.  Heart: Regular rate and rhythm, no murmurs rubs or gallops.  Abdomen: Bowel sounds are normal, nontender, nondistended, no hepatosplenomegaly or masses,  no abdominal bruits or hernia , no rebound or guarding.  Rectal: not performed  Extremities: No lower extremity edema. No clubbing or deformities. Neuro: Alert and oriented x 4   Skin: Warm and dry, no jaundice.   Psych: Alert and cooperative, normal mood and affect.  Labs   Lab Results  Component Value Date   WBC 4.1 12/07/2023   HGB 12.6 (L) 12/07/2023   HCT 40.1 12/07/2023   MCV 83 12/07/2023    PLT 173 12/07/2023   Lab Results  Component Value Date   TSH 3.960 12/07/2023   Lab Results  Component Value Date   NA 147 (H) 12/07/2023   CL 105 12/07/2023   K 3.9 12/07/2023   CO2 24 12/07/2023   BUN 11 12/07/2023   CREATININE 1.29 (H) 12/07/2023   EGFR 54 (L) 12/07/2023   CALCIUM  9.1 12/07/2023   ALBUMIN 3.9 05/31/2023   GLUCOSE 99 12/07/2023   Lab Results  Component Value Date   ALT 9 05/31/2023   AST 10 05/31/2023   ALKPHOS 93 05/31/2023   BILITOT 0.8 05/31/2023    Imaging Studies   DG Chest 2 View Result Date: 12/17/2023 CLINICAL DATA:  Short of breath since having COVID-19 infection 2-3 years ago. History of heart attack. History of cardiac stents and COPD. EXAM: CHEST - 2 VIEW COMPARISON:  04/30/2023.  CT, 06/29/2023. FINDINGS: Cardiac silhouette is normal in size and configuration. No mediastinal or hilar masses. No evidence of adenopathy. Lungs are hyperexpanded. There is mild bilateral interstitial thickening, most evident in the peripheral lower lungs. Relative lucency noted in the upper lobes consistent with emphysema. There is no lung consolidation. No pleural effusion. Skeletal structures are intact IMPRESSION: 1. No  acute cardiopulmonary disease. 2. COPD.  Stable appearance from the prior chest radiograph. Electronically Signed   By: Amanda Jungling M.D.   On: 12/17/2023 11:16    Assessment/Plan   Recent gastroenteritis: improved. Appetite slowly returning. No BM in four days. Abdominal pain resolved.  -start a probiotic daily for four weeks -start colace 200mg  daily -if no BM in 24 to 48 hours, take bisacodyl 10mg  daily until has a stool -call if bowel function and appetite do not return to normal.  Trudie Fuse. Harles Lied, MHS, PA-C [facility]

## 2024-01-05 NOTE — Patient Instructions (Signed)
 Start a good probiotic once daily for next four weeks to try and stabilize your digestive system which has been upset by recent illness. You can choose anything over the counter but good ones are [employer] and Align. Start taking a stool softener (docusate sodium) 200 mg every day. If you do not have a bowel movement in the next 24-48 hours, please take bisacodyl 10mg  daily until you have a stool.  Please call if your bowel movements do not return to normal.

## 2024-01-12 ENCOUNTER — Other Ambulatory Visit: Payer: Self-pay

## 2024-01-12 ENCOUNTER — Telehealth (INDEPENDENT_AMBULATORY_CARE_PROVIDER_SITE_OTHER): Payer: Self-pay | Admitting: *Deleted

## 2024-01-12 ENCOUNTER — Emergency Department (HOSPITAL_COMMUNITY)

## 2024-01-12 ENCOUNTER — Inpatient Hospital Stay (HOSPITAL_COMMUNITY)
Admission: EM | Admit: 2024-01-12 | Discharge: 2024-01-15 | DRG: 378 | Disposition: A | Discharge status: Home / Self Care | Attending: Internal Medicine | Admitting: Internal Medicine

## 2024-01-12 ENCOUNTER — Telehealth: Payer: Self-pay

## 2024-01-12 DIAGNOSIS — I10 Essential (primary) hypertension: Secondary | ICD-10-CM | POA: Diagnosis present

## 2024-01-12 DIAGNOSIS — I252 Old myocardial infarction: Secondary | ICD-10-CM

## 2024-01-12 DIAGNOSIS — Z961 Presence of intraocular lens: Secondary | ICD-10-CM | POA: Diagnosis present

## 2024-01-12 DIAGNOSIS — Z6825 Body mass index (BMI) 25.0-25.9, adult: Secondary | ICD-10-CM | POA: Diagnosis not present

## 2024-01-12 DIAGNOSIS — K5730 Diverticulosis of large intestine without perforation or abscess without bleeding: Secondary | ICD-10-CM | POA: Diagnosis not present

## 2024-01-12 DIAGNOSIS — K219 Gastro-esophageal reflux disease without esophagitis: Secondary | ICD-10-CM | POA: Diagnosis present

## 2024-01-12 DIAGNOSIS — Z85828 Personal history of other malignant neoplasm of skin: Secondary | ICD-10-CM

## 2024-01-12 DIAGNOSIS — E46 Unspecified protein-calorie malnutrition: Secondary | ICD-10-CM | POA: Diagnosis not present

## 2024-01-12 DIAGNOSIS — G4730 Sleep apnea, unspecified: Secondary | ICD-10-CM | POA: Diagnosis present

## 2024-01-12 DIAGNOSIS — Z8719 Personal history of other diseases of the digestive system: Secondary | ICD-10-CM | POA: Diagnosis not present

## 2024-01-12 DIAGNOSIS — Z720 Tobacco use: Secondary | ICD-10-CM

## 2024-01-12 DIAGNOSIS — Z9841 Cataract extraction status, right eye: Secondary | ICD-10-CM

## 2024-01-12 DIAGNOSIS — D62 Acute posthemorrhagic anemia: Secondary | ICD-10-CM | POA: Diagnosis present

## 2024-01-12 DIAGNOSIS — E876 Hypokalemia: Secondary | ICD-10-CM | POA: Diagnosis present

## 2024-01-12 DIAGNOSIS — N281 Cyst of kidney, acquired: Secondary | ICD-10-CM | POA: Diagnosis not present

## 2024-01-12 DIAGNOSIS — Z800 Family history of malignant neoplasm of digestive organs: Secondary | ICD-10-CM

## 2024-01-12 DIAGNOSIS — Z9842 Cataract extraction status, left eye: Secondary | ICD-10-CM | POA: Diagnosis not present

## 2024-01-12 DIAGNOSIS — K5731 Diverticulosis of large intestine without perforation or abscess with bleeding: Secondary | ICD-10-CM | POA: Diagnosis present

## 2024-01-12 DIAGNOSIS — E782 Mixed hyperlipidemia: Secondary | ICD-10-CM | POA: Diagnosis present

## 2024-01-12 DIAGNOSIS — Z79899 Other long term (current) drug therapy: Secondary | ICD-10-CM | POA: Diagnosis not present

## 2024-01-12 DIAGNOSIS — E8809 Other disorders of plasma-protein metabolism, not elsewhere classified: Secondary | ICD-10-CM | POA: Insufficient documentation

## 2024-01-12 DIAGNOSIS — Z8249 Family history of ischemic heart disease and other diseases of the circulatory system: Secondary | ICD-10-CM | POA: Diagnosis not present

## 2024-01-12 DIAGNOSIS — J449 Chronic obstructive pulmonary disease, unspecified: Secondary | ICD-10-CM | POA: Diagnosis present

## 2024-01-12 DIAGNOSIS — I2510 Atherosclerotic heart disease of native coronary artery without angina pectoris: Secondary | ICD-10-CM | POA: Diagnosis present

## 2024-01-12 DIAGNOSIS — D120 Benign neoplasm of cecum: Secondary | ICD-10-CM | POA: Diagnosis not present

## 2024-01-12 DIAGNOSIS — K921 Melena: Secondary | ICD-10-CM | POA: Diagnosis present

## 2024-01-12 DIAGNOSIS — D122 Benign neoplasm of ascending colon: Secondary | ICD-10-CM | POA: Diagnosis not present

## 2024-01-12 DIAGNOSIS — R710 Precipitous drop in hematocrit: Secondary | ICD-10-CM | POA: Diagnosis not present

## 2024-01-12 DIAGNOSIS — R932 Abnormal findings on diagnostic imaging of liver and biliary tract: Secondary | ICD-10-CM | POA: Diagnosis not present

## 2024-01-12 DIAGNOSIS — K625 Hemorrhage of anus and rectum: Secondary | ICD-10-CM | POA: Diagnosis not present

## 2024-01-12 DIAGNOSIS — N200 Calculus of kidney: Secondary | ICD-10-CM | POA: Diagnosis not present

## 2024-01-12 DIAGNOSIS — K922 Gastrointestinal hemorrhage, unspecified: Secondary | ICD-10-CM | POA: Diagnosis present

## 2024-01-12 DIAGNOSIS — D123 Benign neoplasm of transverse colon: Secondary | ICD-10-CM | POA: Diagnosis not present

## 2024-01-12 DIAGNOSIS — K635 Polyp of colon: Secondary | ICD-10-CM | POA: Diagnosis not present

## 2024-01-12 LAB — TYPE AND SCREEN
ABO/RH(D): O NEG
Antibody Screen: NEGATIVE

## 2024-01-12 LAB — COMPREHENSIVE METABOLIC PANEL WITH GFR
ALT: 9 U/L (ref 0–44)
AST: 14 U/L — ABNORMAL LOW (ref 15–41)
Albumin: 3.2 g/dL — ABNORMAL LOW (ref 3.5–5.0)
Alkaline Phosphatase: 84 U/L (ref 38–126)
Anion gap: 7 (ref 5–15)
BUN: 11 mg/dL (ref 8–23)
CO2: 27 mmol/L (ref 22–32)
Calcium: 8.4 mg/dL — ABNORMAL LOW (ref 8.9–10.3)
Chloride: 107 mmol/L (ref 98–111)
Creatinine, Ser: 1.21 mg/dL (ref 0.61–1.24)
GFR, Estimated: 58 mL/min — ABNORMAL LOW (ref >60)
Glucose, Bld: 124 mg/dL — ABNORMAL HIGH (ref 70–99)
Potassium: 3.2 mmol/L — ABNORMAL LOW (ref 3.5–5.1)
Sodium: 141 mmol/L (ref 135–145)
Total Bilirubin: 1 mg/dL (ref 0.0–1.2)
Total Protein: 5.7 g/dL — ABNORMAL LOW (ref 6.5–8.1)

## 2024-01-12 LAB — CBC
HCT: 36.1 % — ABNORMAL LOW (ref 39.0–52.0)
Hemoglobin: 11.2 g/dL — ABNORMAL LOW (ref 13.0–17.0)
MCH: 25.9 pg — ABNORMAL LOW (ref 26.0–34.0)
MCHC: 31 g/dL (ref 30.0–36.0)
MCV: 83.6 fL (ref 80.0–100.0)
Platelets: 170 10*3/uL (ref 150–400)
RBC: 4.32 MIL/uL (ref 4.22–5.81)
RDW: 14.4 % (ref 11.5–15.5)
WBC: 4.3 10*3/uL (ref 4.0–10.5)
nRBC: 0 % (ref 0.0–0.2)

## 2024-01-12 MED ORDER — IOHEXOL 350 MG/ML SOLN
100.0000 mL | Freq: Once | INTRAVENOUS | Status: AC | PRN
  Administered 2024-01-12: 100 mL via INTRAVENOUS

## 2024-01-12 MED ORDER — POTASSIUM CHLORIDE 20 MEQ PO PACK
40.0000 meq | PACK | Freq: Once | ORAL | Status: AC
  Administered 2024-01-12: 40 meq via ORAL
  Filled 2024-01-12: qty 2

## 2024-01-12 NOTE — ED Provider Notes (Signed)
 [place] [facility] EMERGENCY DEPARTMENT AT [facility] Provider Note   CSN: [id number] Arrival date & time: 01/12/24  1531     History  Chief Complaint  Patient presents with   Rectal Bleeding    Jeremy [name] is a 88 y.o. male.   Rectal Bleeding Patient presents for painless rectal bleeding.  Medical history includes HTN, HLD, CAD, COPD, prior GI bleed, diverticulosis.  He is not on a blood thinner.  Starting last night, patient had painless episode of bright red rectal blood.  These seem to only occur when he has a bowel movement.  He had multiple episodes throughout the day.  Last episode was around 3 to 4 PM.  He denies any rectal pain or abdominal pain.  He states that he has felt some generalized weakness today.  He has not had any near syncopal symptoms.     Home Medications Prior to Admission medications   Medication Sig Start Date End Date Taking? Authorizing Provider  Acetaminophen  (TYLENOL  8 HOUR PO) Take by mouth. Pt is taking 500mg     [provider]  atorvastatin  (LIPITOR) 80 MG tablet Take 1 tablet (80 mg total) by mouth at bedtime. 07/26/23   Laurann Pollock, MD  formoterol  (PERFOROMIST ) 20 MCG/2ML nebulizer solution Substituted for: Perforomist  Solution Inhale one vial in nebulizer twice a day. 12/20/23   Diamond Formica, MD  hydrOXYzine  (ATARAX ) 25 MG tablet Take 25 mg by mouth at bedtime as needed. 09/21/23   [provider]  ipratropium-albuterol  (DUONEB) 0.5-2.5 (3) MG/3ML SOLN Take 3 mLs by nebulization every 4 (four) hours as needed.    [provider]  losartan  (COZAAR ) 100 MG tablet Take 100 mg by mouth daily. 01/02/22   [provider]  nitroGLYCERIN  (NITROSTAT ) 0.4 MG SL tablet PLACE 1 TABLET UNDER THE TONGUE EVERY 5 MINUTES AS NEEDED FOR CHEST PAIN. MAX 3 TABLETS PER EVENT. CALL 911 12/24/22   Laurann Pollock, MD  pantoprazole  (PROTONIX ) 40 MG tablet TAKE 1 TABLET(40 MG) BY MOUTH TWICE DAILY 10/01/23   Laurann Pollock, MD  revefenacin  (YUPELRI ) 175 MCG/3ML nebulizer solution Take 3 mLs (175 mcg total) by nebulization daily. 10/13/23   Diamond Formica, MD      Allergies    Ibuprofen     Review of Systems   Review of Systems  Gastrointestinal:  Positive for blood in stool and hematochezia.  Neurological:  Positive for weakness.  All other systems reviewed and are negative.   Physical Exam Updated Vital Signs BP (!) 145/62   Pulse 60   Temp 98.4 F (36.9 C) (Oral)   Resp (!) 22   Ht 6' (1.829 m)   Wt 84.4 kg   SpO2 96%   BMI 25.23 kg/m  Physical Exam Vitals and nursing note reviewed.  Constitutional:      General: He is not in acute distress.    Appearance: Normal appearance. He is well-developed. He is not ill-appearing, toxic-appearing or diaphoretic.  HENT:     Head: Normocephalic and atraumatic.     Right Ear: External ear normal.     Left Ear: External ear normal.     Nose: Nose normal.     Mouth/Throat:     Mouth: Mucous membranes are moist.  Eyes:     Extraocular Movements: Extraocular movements intact.     Conjunctiva/sclera: Conjunctivae normal.  Cardiovascular:     Rate and Rhythm: Normal rate and regular rhythm.  Pulmonary:     Effort: Pulmonary  effort is normal. No respiratory distress.  Abdominal:     General: There is no distension.     Palpations: Abdomen is soft.     Tenderness: There is no abdominal tenderness.  Musculoskeletal:        General: No swelling. Normal range of motion.     Cervical back: Normal range of motion and neck supple.  Skin:    General: Skin is warm and dry.     Coloration: Skin is not jaundiced or pale.  Neurological:     General: No focal deficit present.     Mental Status: He is alert and oriented to person, place, and time.  Psychiatric:        Mood and Affect: Mood normal.        Behavior: Behavior normal.     ED Results / Procedures / Treatments   Labs (all labs ordered are listed, but only abnormal results are  displayed) Labs Reviewed  COMPREHENSIVE METABOLIC PANEL WITH GFR - Abnormal; Notable for the following components:      Result Value   Potassium 3.2 (*)    Glucose, Bld 124 (*)    Calcium  8.4 (*)    Total Protein 5.7 (*)    Albumin 3.2 (*)    AST 14 (*)    GFR, Estimated 58 (*)    All other components within normal limits  CBC - Abnormal; Notable for the following components:   Hemoglobin 11.2 (*)    HCT 36.1 (*)    MCH 25.9 (*)    All other components within normal limits  POC OCCULT BLOOD, ED  TYPE AND SCREEN    EKG None  Radiology No results found.  Procedures Procedures    Medications Ordered in ED Medications  potassium chloride  (KLOR-CON ) packet 40 mEq (40 mEq Oral Given 01/12/24 2103)  iohexol  (OMNIPAQUE ) 350 MG/ML injection 100 mL (100 mLs Intravenous Contrast Given 01/12/24 2143)    ED Course/ Medical Decision Making/ A&P                                 Medical Decision Making Amount and/or Complexity of Data Reviewed Labs: ordered. Radiology: ordered.  Risk Prescription drug management. Decision regarding hospitalization.   This patient presents to the ED for concern of hematochezia, this involves an extensive number of treatment options, and is a complaint that carries with it a high risk of complications and morbidity.  The differential diagnosis includes diverticular bleed, hemorrhoidal bleed, upper GI bleed with rapid transit, colitis, blood loss anemia   Co morbidities that complicate the patient evaluation  HTN, HLD, CAD, COPD, prior GI bleed, diverticulosis   Additional history obtained:  Additional history obtained from patient's wife External records from outside source obtained and reviewed including EMR   Lab Tests:  I Ordered, and personally interpreted labs.  The pertinent results include: 1.4 g/dL drop in hemoglobin when compared to lab work from a month ago; hypokalemia, no leukocytosis, normal kidney function   Imaging  Studies ordered:  I ordered imaging studies including CTA of abdomen and pelvis I independently visualized and interpreted imaging which showed no evidence of active bleeding.  Severe distal colonic diverticulosis is present without evidence of diverticulitis. I agree with the radiologist interpretation   Cardiac Monitoring: / EKG:  The patient was maintained on a cardiac monitor.  I personally viewed and interpreted the cardiac monitored which showed an underlying rhythm of: Sinus  rhythm   Problem List / ED Course / Critical interventions / Medication management  Patient presenting for hematochezia.  Onset was last night and it did continue throughout the day.  Last episode was 3 to 4 PM.  On arrival in the ED, patient's vital signs are normal.  He is well-appearing on exam.  Abdomen is soft and nontender.  On lab work, he does have a 1.4 g/dL drop when compared to lab work from a month ago.  His potassium was also slightly low.  Replacement potassium was ordered.  CTA shows no active extravasation.  While in the ED, patient had no further episodes of hematochezia.  I spoke with gastroenterologist on-call, Dr. Riley Cheadle, who recommends admission tonight and GI team will see in the morning.  Clear liquid diet was ordered.  Patient was admitted for further management. I ordered medication including potassium chloride  for hypokalemia Reevaluation of the patient after these medicines showed that the patient improved I have reviewed the patients home medicines and have made adjustments as needed   Consultations Obtained:  I requested consultation with the gastroenterologist, Dr. Riley Cheadle,  and discussed lab and imaging findings as well as pertinent plan - they recommend: Medicine admission, GI will see first in the morning.   Social Determinants of Health:  Lives at home with wife         Final Clinical Impression(s) / ED Diagnoses Final diagnoses:  Hematochezia    Rx / DC Orders ED  Discharge Orders     None         Iva Mariner, MD 01/13/24 [id number]

## 2024-01-12 NOTE — Telephone Encounter (Signed)
 Pt's wife called stating that the pt had brb in the toilet this morning and is wanting to know what you recommend that they do. Wife was requesting pt to be seen, I told the wife that I would send a telephone note to see what your recommendations were before scheduling the pt an appt. Wife stated that the last time this happened pt ended up in hospital. Please advise.

## 2024-01-12 NOTE — Telephone Encounter (Signed)
 Agree with ED evaluation.

## 2024-01-12 NOTE — Telephone Encounter (Signed)
 Once again this patient has called my line and left a message--She says her husband is once again bleeding from the rectum

## 2024-01-12 NOTE — Telephone Encounter (Signed)
 Spoke with pt's wife and she stated that he has had 4 episodes today, no clots and the last episode was around 2. Wife stated that she also called Dr. Quentin Brunner office and they advised her to take him to the ED. They are headed there now.

## 2024-01-12 NOTE — Telephone Encounter (Signed)
 Tammy please call patient and get more information.  How many episodes of passing blood? Did he pass any clots? When was last episode of passing blood?  He has history of bleeding requiring hospitalization last year, suspected diverticular bleeding at that time.  If he is still passing blood per rectum, has had multiple episodes, feels weak/fatigued/, he should go to the ED.    Let me know what you find out.

## 2024-01-12 NOTE — ED Triage Notes (Addendum)
 Pt from home complains of rectal bleeding that started today, reports hx of rectal bleeding. According to wife she saw bright blood on the to.ilet. Pt endorses weakness and fatigue and abdominal pain. Denies nausea and vomiting. Pt ambulatory and AAOx4, [facility]

## 2024-01-13 ENCOUNTER — Encounter (HOSPITAL_COMMUNITY): Payer: Self-pay | Admitting: Internal Medicine

## 2024-01-13 DIAGNOSIS — I2510 Atherosclerotic heart disease of native coronary artery without angina pectoris: Secondary | ICD-10-CM | POA: Diagnosis present

## 2024-01-13 DIAGNOSIS — E46 Unspecified protein-calorie malnutrition: Secondary | ICD-10-CM | POA: Diagnosis present

## 2024-01-13 DIAGNOSIS — Z961 Presence of intraocular lens: Secondary | ICD-10-CM | POA: Diagnosis present

## 2024-01-13 DIAGNOSIS — Z8719 Personal history of other diseases of the digestive system: Secondary | ICD-10-CM

## 2024-01-13 DIAGNOSIS — Z85828 Personal history of other malignant neoplasm of skin: Secondary | ICD-10-CM | POA: Diagnosis not present

## 2024-01-13 DIAGNOSIS — K625 Hemorrhage of anus and rectum: Secondary | ICD-10-CM | POA: Diagnosis not present

## 2024-01-13 DIAGNOSIS — D120 Benign neoplasm of cecum: Secondary | ICD-10-CM | POA: Diagnosis not present

## 2024-01-13 DIAGNOSIS — K5730 Diverticulosis of large intestine without perforation or abscess without bleeding: Secondary | ICD-10-CM | POA: Diagnosis not present

## 2024-01-13 DIAGNOSIS — Z8249 Family history of ischemic heart disease and other diseases of the circulatory system: Secondary | ICD-10-CM | POA: Diagnosis not present

## 2024-01-13 DIAGNOSIS — Z720 Tobacco use: Secondary | ICD-10-CM | POA: Diagnosis not present

## 2024-01-13 DIAGNOSIS — E8809 Other disorders of plasma-protein metabolism, not elsewhere classified: Secondary | ICD-10-CM | POA: Diagnosis present

## 2024-01-13 DIAGNOSIS — D62 Acute posthemorrhagic anemia: Secondary | ICD-10-CM | POA: Diagnosis present

## 2024-01-13 DIAGNOSIS — R710 Precipitous drop in hematocrit: Secondary | ICD-10-CM | POA: Diagnosis not present

## 2024-01-13 DIAGNOSIS — Z6825 Body mass index (BMI) 25.0-25.9, adult: Secondary | ICD-10-CM | POA: Diagnosis not present

## 2024-01-13 DIAGNOSIS — K635 Polyp of colon: Secondary | ICD-10-CM | POA: Diagnosis not present

## 2024-01-13 DIAGNOSIS — Z79899 Other long term (current) drug therapy: Secondary | ICD-10-CM | POA: Diagnosis not present

## 2024-01-13 DIAGNOSIS — K219 Gastro-esophageal reflux disease without esophagitis: Secondary | ICD-10-CM

## 2024-01-13 DIAGNOSIS — J449 Chronic obstructive pulmonary disease, unspecified: Secondary | ICD-10-CM

## 2024-01-13 DIAGNOSIS — K922 Gastrointestinal hemorrhage, unspecified: Secondary | ICD-10-CM | POA: Diagnosis not present

## 2024-01-13 DIAGNOSIS — I10 Essential (primary) hypertension: Secondary | ICD-10-CM

## 2024-01-13 DIAGNOSIS — D123 Benign neoplasm of transverse colon: Secondary | ICD-10-CM | POA: Diagnosis not present

## 2024-01-13 DIAGNOSIS — E876 Hypokalemia: Secondary | ICD-10-CM | POA: Diagnosis present

## 2024-01-13 DIAGNOSIS — K5731 Diverticulosis of large intestine without perforation or abscess with bleeding: Secondary | ICD-10-CM | POA: Diagnosis present

## 2024-01-13 DIAGNOSIS — Z800 Family history of malignant neoplasm of digestive organs: Secondary | ICD-10-CM | POA: Diagnosis not present

## 2024-01-13 DIAGNOSIS — Z9842 Cataract extraction status, left eye: Secondary | ICD-10-CM | POA: Diagnosis not present

## 2024-01-13 DIAGNOSIS — E782 Mixed hyperlipidemia: Secondary | ICD-10-CM | POA: Diagnosis present

## 2024-01-13 DIAGNOSIS — Z9841 Cataract extraction status, right eye: Secondary | ICD-10-CM | POA: Diagnosis not present

## 2024-01-13 DIAGNOSIS — I252 Old myocardial infarction: Secondary | ICD-10-CM | POA: Diagnosis not present

## 2024-01-13 DIAGNOSIS — K921 Melena: Secondary | ICD-10-CM | POA: Diagnosis present

## 2024-01-13 DIAGNOSIS — G4730 Sleep apnea, unspecified: Secondary | ICD-10-CM | POA: Diagnosis present

## 2024-01-13 DIAGNOSIS — D122 Benign neoplasm of ascending colon: Secondary | ICD-10-CM | POA: Diagnosis not present

## 2024-01-13 LAB — COMPREHENSIVE METABOLIC PANEL WITH GFR
ALT: 10 U/L (ref 0–44)
AST: 13 U/L — ABNORMAL LOW (ref 15–41)
Albumin: 3 g/dL — ABNORMAL LOW (ref 3.5–5.0)
Alkaline Phosphatase: 74 U/L (ref 38–126)
Anion gap: 7 (ref 5–15)
BUN: 13 mg/dL (ref 8–23)
CO2: 27 mmol/L (ref 22–32)
Calcium: 7.9 mg/dL — ABNORMAL LOW (ref 8.9–10.3)
Chloride: 108 mmol/L (ref 98–111)
Creatinine, Ser: 1.02 mg/dL (ref 0.61–1.24)
GFR, Estimated: >60 mL/min (ref >60)
Glucose, Bld: 81 mg/dL (ref 70–99)
Potassium: 3.2 mmol/L — ABNORMAL LOW (ref 3.5–5.1)
Sodium: 142 mmol/L (ref 135–145)
Total Bilirubin: 1.3 mg/dL — ABNORMAL HIGH (ref 0.0–1.2)
Total Protein: 5.2 g/dL — ABNORMAL LOW (ref 6.5–8.1)

## 2024-01-13 LAB — CBC
HCT: 32.3 % — ABNORMAL LOW (ref 39.0–52.0)
Hemoglobin: 10.2 g/dL — ABNORMAL LOW (ref 13.0–17.0)
MCH: 26.4 pg (ref 26.0–34.0)
MCHC: 31.6 g/dL (ref 30.0–36.0)
MCV: 83.5 fL (ref 80.0–100.0)
Platelets: 155 10*3/uL (ref 150–400)
RBC: 3.87 MIL/uL — ABNORMAL LOW (ref 4.22–5.81)
RDW: 14.6 % (ref 11.5–15.5)
WBC: 3.9 10*3/uL — ABNORMAL LOW (ref 4.0–10.5)
nRBC: 0 % (ref 0.0–0.2)

## 2024-01-13 LAB — HEMOGLOBIN AND HEMATOCRIT, BLOOD
HCT: 33.1 % — ABNORMAL LOW (ref 39.0–52.0)
Hemoglobin: 10.3 g/dL — ABNORMAL LOW (ref 13.0–17.0)

## 2024-01-13 LAB — MAGNESIUM: Magnesium: 1.9 mg/dL (ref 1.7–2.4)

## 2024-01-13 LAB — PHOSPHORUS: Phosphorus: 3.2 mg/dL (ref 2.5–4.6)

## 2024-01-13 MED ORDER — ACETAMINOPHEN 650 MG RE SUPP: 650.0000 mg | Freq: Four times a day (QID) | RECTAL | Status: DC | PRN

## 2024-01-13 MED ORDER — LOSARTAN POTASSIUM 50 MG PO TABS
100.0000 mg | ORAL_TABLET | Freq: Every day | ORAL | Status: DC
  Administered 2024-01-13 – 2024-01-15 (×3): 100 mg via ORAL
  Filled 2024-01-13 (×2): qty 2
  Filled 2024-01-13: qty 4

## 2024-01-13 MED ORDER — ACETAMINOPHEN 325 MG PO TABS: 650.0000 mg | ORAL_TABLET | Freq: Four times a day (QID) | ORAL | Status: DC | PRN

## 2024-01-13 MED ORDER — REVEFENACIN 175 MCG/3ML IN SOLN
175.0000 ug | Freq: Every day | RESPIRATORY_TRACT | Status: DC
  Administered 2024-01-14 – 2024-01-15 (×2): 175 ug via RESPIRATORY_TRACT
  Filled 2024-01-13 (×2): qty 3

## 2024-01-13 MED ORDER — HYDROXYZINE HCL 25 MG PO TABS
25.0000 mg | ORAL_TABLET | Freq: Every evening | ORAL | Status: DC | PRN
Start: 2024-01-13 — End: 2024-01-15
  Administered 2024-01-13: 25 mg via ORAL
  Filled 2024-01-13: qty 1

## 2024-01-13 MED ORDER — ENSURE ENLIVE PO LIQD
237.0000 mL | Freq: Two times a day (BID) | ORAL | Status: DC
Start: 2024-01-13 — End: 2024-01-15
  Administered 2024-01-13 – 2024-01-14 (×2): 237 mL via ORAL
  Filled 2024-01-13 (×6): qty 237

## 2024-01-13 MED ORDER — PANTOPRAZOLE SODIUM 40 MG PO TBEC
40.0000 mg | DELAYED_RELEASE_TABLET | Freq: Two times a day (BID) | ORAL | Status: DC
  Administered 2024-01-13 – 2024-01-15 (×5): 40 mg via ORAL
  Filled 2024-01-13 (×5): qty 1

## 2024-01-13 MED ORDER — ARFORMOTEROL TARTRATE 15 MCG/2ML IN NEBU
15.0000 ug | INHALATION_SOLUTION | Freq: Two times a day (BID) | RESPIRATORY_TRACT | Status: DC
  Administered 2024-01-13 – 2024-01-15 (×4): 15 ug via RESPIRATORY_TRACT
  Filled 2024-01-13 (×3): qty 2

## 2024-01-13 MED ORDER — POTASSIUM CHLORIDE 20 MEQ PO PACK
40.0000 meq | PACK | ORAL | Status: AC
  Administered 2024-01-13 (×2): 40 meq via ORAL
  Filled 2024-01-13 (×2): qty 2

## 2024-01-13 MED ORDER — ONDANSETRON HCL 4 MG PO TABS
4.0000 mg | ORAL_TABLET | Freq: Four times a day (QID) | ORAL | Status: DC | PRN
  Administered 2024-01-14: 4 mg via ORAL
  Filled 2024-01-13: qty 1

## 2024-01-13 MED ORDER — ONDANSETRON HCL 4 MG/2ML IJ SOLN: 4.0000 mg | Freq: Four times a day (QID) | INTRAMUSCULAR | Status: DC | PRN

## 2024-01-13 MED ORDER — ATORVASTATIN CALCIUM 40 MG PO TABS
80.0000 mg | ORAL_TABLET | Freq: Every day | ORAL | Status: DC
  Administered 2024-01-13 – 2024-01-14 (×2): 80 mg via ORAL
  Filled 2024-01-13 (×2): qty 2

## 2024-01-13 MED ORDER — IPRATROPIUM-ALBUTEROL 0.5-2.5 (3) MG/3ML IN SOLN: 3.0000 mL | RESPIRATORY_TRACT | Status: DC | PRN

## 2024-01-13 NOTE — TOC CM/SW Note (Signed)
 Transition of Care [facility]) - Inpatient Brief Assessment   Patient Details  Name: Jeremy [name] MRN: [id number] Date of Birth: Jan 22, 1936  Transition of Care [facility]) CM/SW Contact:    Cyndie Dredge, LCSWA Phone Number: 01/13/2024, 8:09 AM   Clinical Narrative:  Transition of Care Department [facility]) has reviewed patient and no TOC needs have been identified at this time. We will continue to monitor patient advancement through interdisciplinary progression rounds. If new patient transition needs arise, please place a TOC consult.  Transition of Care Asessment: Insurance and Status: Insurance coverage has been reviewed Patient has primary care physician: Yes Home environment has been reviewed: Single Family Home with Spouse Prior level of function:: Independent Prior/Current Home Services: No current home services Social Drivers of Health Review: SDOH reviewed no interventions necessary Readmission risk has been reviewed: Yes Transition of care needs: no transition of care needs at this time

## 2024-01-13 NOTE — Hospital Course (Addendum)
 88 yom w/  HTN HLD, MI/CAD W/ prior GI bleed, diverticulosis [facility] presented to the ED due to rectal bleed with bright red blood in the commode noticed on 4/58morning between 7:30 AM to 8 AM and continued to have episodes of this every 2-3 hours with last episode being between 3 to 4 PM  and brought to the ED  where vitals stable labs showed normocytic anemia and normal BMP except for potassium of 3.2, blood glucose 124, albumin 3.2. CT abdomen and pelvis without and with contrast>>no active GI hemorrhage, but showed severe distal colonic diverticulosis without evidence of acute diverticulitisGastroenterologist on-call (Dr. Riley Cheadle) was consulted and patient was admitted for further management  Subjective: Seen and examined this morning.  Resting comfortably. S/o red blood in the stool, denies nausea vomiting abdominal pain. Overnight afebrile on room air BP stable Labs this morning stable electrolytes, hemoglobin 11.2> 10.2> 10.3>9.5.  Assessment and plan:  Rectal bleeding Acute on chronic anemia: Similar presentation back in August 2022 and had upper EGD and colonoscopy at that time. Suspect diverticular bleeding and likely self-limited. GI following closely, CTA without active extravasation severe distal colonic diverticulosis noticed.  Last EGD in last September for dysphagia, heart dilatation of esophageal stenosis with improvement.  Encouraged to avoid NSAIDs, prevent constipation maintain Metamucil MiraLAX, hemoglobin overall stable although slightly down GI input appreciated, planning for clear liquid diet, n.p.o. past midnight and colonoscopy tomorrow.  Holding aspirin  Recent Labs  Lab 01/12/24 1604 01/13/24 0631 01/13/24 1920 01/14/24 0443  HGB 11.2* 10.2* 10.3* 9.5*  HCT 36.1* 32.3* 33.1* 30.1*    Hypokalemia: Resolved   Hypoalbuminemia: Albumin 3.2, augment nutrition    Essential hypertension BP remains well-controlled. Continue losartan    Mixed hyperlipidemia Continue  Lipitor   COPD Continue DuoNeb as needed   GERD Continue Protonix 

## 2024-01-13 NOTE — H&P (Signed)
 History and Physical    Patient: Jeremy [name] UEA:[id number] DOB: 1936/01/20 DOA: 01/12/2024 DOS: the patient was seen and examined on 01/13/2024 PCP: Omie Bickers, MD  Patient coming from: Home  Chief Complaint:  Chief Complaint  Patient presents with   Rectal Bleeding   HPI: Jeremy [name] is a 88 y.o. male with medical history significant of hypertension, hyperlipidemia,  myocardial infarction, CAD, prior GI bleed, diverticulosis who presents to the emergency department due to rectal bleed.  Most of the history was obtained from wife at bedside, per report, patient started to notice bright red blood in the commode yesterday in the morning between 7:30 AM to 8 AM and continued to have episodes of this every 2-3 hours with last episode being between 3 to 4 PM yesterday.  He complained of weakness and fatigue, but denies nausea and vomiting.  ED Course:  In the emergency department, BP was 162/88, other vital signs were within normal range.  Workup in the ED showed normocytic anemia and normal BMP except for potassium of 3.2, blood glucose 124, albumin 3.2. CT abdomen and pelvis without and with contrast showed no active gastrointestinal hemorrhage, but showed severe distal colonic diverticulosis without evidence of acute diverticulitis. Gastroenterologist on-call (Dr. Riley Cheadle) was consulted and recommends admitting patient with plan for GI team to see patient in the morning. Potassium was replenished.  Hospitalist was asked to admit patient for further evaluation and management.  Review of Systems: Review of systems as noted in the HPI. All other systems reviewed and are negative.   Past Medical History:  Diagnosis Date   Abnormal CXR 4/20011   Chest  CT mode/serere COPD ( no mediastinal abnormality)   CAD (coronary artery disease) 2005   anterior MI with stent to the LAD in 1991 / stent 1996 / nuclear 2005, no ischemia   Carotid bruit    Doppler, June, 2013, 0-39% bilateral   COPD  (chronic obstructive pulmonary disease) (HCC)    Diverticula of colon    Ejection fraction    55-60%, echo, April, 2011, could not estimate RV pressure   Esophagitis    Gastroenteritis    HOH (hard of hearing)    Hyperlipidemia    Hypertension    Myocardial infarction (HCC)    x2   Shortness of breath    with exertion   Sleep apnea    Stop Bang score of 5   Tobacco abuse    Past Surgical History:  Procedure Laterality Date   APPENDECTOMY     BIOPSY  11/17/2017   Procedure: BIOPSY;  Surgeon: Ruby Corporal, MD;  Location: [facility];  Service: Endoscopy;;  gastric   BIOPSY  06/11/2023   Procedure: BIOPSY;  Surgeon: Hargis Lias, MD;  Location: [facility];  Service: Endoscopy;;   CARDIAC CATHETERIZATION     CATARACT EXTRACTION Left    CATARACT EXTRACTION W/PHACO Right 01/22/2014   Procedure: CATARACT EXTRACTION PHACO AND INTRAOCULAR LENS PLACEMENT (IOC);  Surgeon: Anner Kill, MD;  Location: [facility];  Service: Ophthalmology;  Laterality: Right;  CDE 7.46   CHOLECYSTECTOMY     8 years ago [facility]   COLONOSCOPY  10/05/2012   Dr. Homero Luster: multiple diverticula at sigmoid colon, multiple polyps (tubular adenomas), small external hemorrhoids   COLONOSCOPY N/A 11/18/2017   Procedure: COLONOSCOPY;  Surgeon: Ruby Corporal, MD;  Location: [facility];  Service: Endoscopy;  Laterality: N/A;   COLONOSCOPY WITH PROPOFOL  N/A 05/07/2021   Procedure: COLONOSCOPY WITH  PROPOFOL ;  Surgeon: Ruby Corporal, MD;  [facility]: [facility];  Service: Endoscopy;  Laterality: N/A;   CYST REMOVAL HAND     right wrist   ESOPHAGEAL DILATION N/A 05/23/2015   Procedure: ESOPHAGEAL DILATION;  Surgeon: Suzette Espy, MD;  [facility]: [facility];  Service: Endoscopy;  Laterality: N/A;   ESOPHAGEAL DILATION N/A 11/17/2017   Procedure: ESOPHAGEAL DILATION;  Surgeon: Ruby Corporal, MD;  [facility]: [facility];  Service: Endoscopy;  Laterality: N/A;   ESOPHAGEAL DILATION N/A 05/07/2021   Procedure:  ESOPHAGEAL DILATION;  Surgeon: Ruby Corporal, MD;  [facility]: [facility];  Service: Endoscopy;  Laterality: N/A;   ESOPHAGOGASTRODUODENOSCOPY N/A 05/23/2015   Dr. Riley Cheadle: Ulcerative/ erosive reflux esophagitis with peptic stricture formation. Status post dilation as described. Hiatal hernia. Abnormal gastric mucosa of uncertain significance status post gastric biospy. +H.pylori   ESOPHAGOGASTRODUODENOSCOPY N/A 11/17/2017   Procedure: ESOPHAGOGASTRODUODENOSCOPY (EGD);  Surgeon: Ruby Corporal, MD;  [facility];  Service: Endoscopy;  Laterality: N/A;   ESOPHAGOGASTRODUODENOSCOPY (EGD) WITH PROPOFOL  N/A 05/07/2021   Procedure: ESOPHAGOGASTRODUODENOSCOPY (EGD) WITH PROPOFOL ;  Surgeon: Ruby Corporal, MD;  [facility];  Service: Endoscopy;  Laterality: N/A;   ESOPHAGOGASTRODUODENOSCOPY (EGD) WITH PROPOFOL  N/A 06/11/2023   Procedure: ESOPHAGOGASTRODUODENOSCOPY (EGD) WITH PROPOFOL ;  Surgeon: Hargis Lias, MD;  Location: [facility];  Service: Endoscopy;  Laterality: N/A;  8:45AM;ASA 3   PERCUTANEOUS STENT INTERVENTION     SKIN CANCER EXCISION  2015   ear & neck   TONSILLECTOMY      Social History:  reports that he quit smoking about 35 years ago. His smoking use included cigarettes. He started smoking about 40 years ago. He has a 36 pack-year smoking history. His smokeless tobacco use includes chew. He reports that he does not drink alcohol and does not use drugs.   Allergies  Allergen Reactions   Ibuprofen      Gi bleed     Family History  Problem Relation Age of Onset   Heart disease Mother    Heart disease Father    Heart disease Brother    Stomach cancer Brother    Colon cancer Neg Hx      Prior to Admission medications   Medication Sig Start Date End Date Taking? Authorizing Provider  Acetaminophen  (TYLENOL  8 HOUR PO) Take by mouth. Pt is taking 500mg     [provider]  atorvastatin  (LIPITOR) 80 MG tablet Take 1 tablet (80 mg total) by mouth  at bedtime. 07/26/23   Laurann Pollock, MD  formoterol  (PERFOROMIST ) 20 MCG/2ML nebulizer solution Substituted for: Perforomist  Solution Inhale one vial in nebulizer twice a day. 12/20/23   Diamond Formica, MD  hydrOXYzine  (ATARAX ) 25 MG tablet Take 25 mg by mouth at bedtime as needed. 09/21/23   [provider]  ipratropium-albuterol  (DUONEB) 0.5-2.5 (3) MG/3ML SOLN Take 3 mLs by nebulization every 4 (four) hours as needed.    [provider]  losartan  (COZAAR ) 100 MG tablet Take 100 mg by mouth daily. 01/02/22   [provider]  nitroGLYCERIN  (NITROSTAT ) 0.4 MG SL tablet PLACE 1 TABLET UNDER THE TONGUE EVERY 5 MINUTES AS NEEDED FOR CHEST PAIN. MAX 3 TABLETS PER EVENT. CALL 911 12/24/22   Laurann Pollock, MD  pantoprazole  (PROTONIX ) 40 MG tablet TAKE 1 TABLET(40 MG) BY MOUTH TWICE DAILY 10/01/23   Laurann Pollock, MD  revefenacin  (YUPELRI ) 175 MCG/3ML nebulizer solution Take 3 mLs (175 mcg total) by nebulization  daily. 10/13/23   Diamond Formica, MD    Physical Exam: BP 134/64   Pulse 62   Temp 98.4 F (36.9 C) (Oral)   Resp (!) 22   Ht 6' (1.829 m)   Wt 84.4 kg   SpO2 95%   BMI 25.23 kg/m   General: 88 y.o. year-old male well developed well nourished in no acute distress.  Alert and oriented x3. HEENT: NCAT, EOMI Neck: Supple, trachea medial Cardiovascular: Regular rate and rhythm with no rubs or gallops.  No thyromegaly or JVD noted.  No lower extremity edema. 2/4 pulses in all 4 extremities. Respiratory: Clear to auscultation with no wheezes or rales. Good inspiratory effort. Abdomen: Soft, nontender nondistended with normal bowel sounds x4 quadrants. Muskuloskeletal: No cyanosis, clubbing or edema noted bilaterally Neuro: CN II-XII intact, strength 5/5 x 4, sensation, reflexes intact Skin: No ulcerative lesions noted or rashes Psychiatry: Mood is appropriate for condition and setting          Labs on Admission:  Basic Metabolic Panel: Recent Labs   Lab 01/12/24 1604  NA 141  K 3.2*  CL 107  CO2 27  GLUCOSE 124*  BUN 11  CREATININE 1.21  CALCIUM  8.4*   Liver Function Tests: Recent Labs  Lab 01/12/24 1604  AST 14*  ALT 9  ALKPHOS 84  BILITOT 1.0  PROT 5.7*  ALBUMIN 3.2*   No results for input(s): "LIPASE", "AMYLASE" in the last 168 hours. No results for input(s): "AMMONIA" in the last 168 hours. CBC: Recent Labs  Lab 01/12/24 1604  WBC 4.3  HGB 11.2*  HCT 36.1*  MCV 83.6  PLT 170   Cardiac Enzymes: No results for input(s): "CKTOTAL", "CKMB", "CKMBINDEX", "TROPONINI" in the last 168 hours.  BNP (last 3 results) Recent Labs    03/17/23 1038 12/07/23 0939  BNP 21.0 35.3    ProBNP (last 3 results) No results for input(s): "PROBNP" in the last 8760 hours.  CBG: No results for input(s): "GLUCAP" in the last 168 hours.  Radiological Exams on Admission: CT ANGIO GI BLEED Result Date: 01/12/2024 CLINICAL DATA:  Lower gastrointestinal hemorrhage EXAM: CTA ABDOMEN AND PELVIS WITHOUT AND WITH CONTRAST TECHNIQUE: Multidetector CT imaging of the abdomen and pelvis was performed using the standard protocol during bolus administration of intravenous contrast. Multiplanar reconstructed images and MIPs were obtained and reviewed to evaluate the vascular anatomy. RADIATION DOSE REDUCTION: This exam was performed according to the departmental dose-optimization program which includes automated exposure control, adjustment of the mA and/or kV according to patient size and/or use of iterative reconstruction technique. CONTRAST:  OMNIPAQUE  IOHEXOL  350 MG/ML SOLN COMPARISON:  None Available. FINDINGS: VASCULAR Aorta: Normal caliber aorta without aneurysm, dissection, vasculitis or significant stenosis. Extensive atherosclerotic calcification Celiac: Patent without evidence of aneurysm, dissection, vasculitis or significant stenosis. Replaced left hepatic artery. 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 Distally widely patent. Renals: Both renal arteries are patent without evidence of aneurysm, dissection, vasculitis, fibromuscular dysplasia or significant stenosis. IMA: Patent without evidence of aneurysm, dissection, vasculitis or significant stenosis. Inflow: Patent without evidence of aneurysm, dissection, vasculitis or significant stenosis. Proximal Outflow: Bilateral common femoral and visualized portions of the superficial and profunda femoral arteries are patent without evidence of aneurysm, dissection, vasculitis or significant stenosis. Veins: Unremarkable Review of the MIP images confirms the above findings. NON-VASCULAR Lower chest: Advanced emphysema no acute abnormality. Hepatobiliary: No focal liver abnormality is seen. Status post cholecystectomy. No biliary dilatation.  Pancreas: Unremarkable Spleen: Unremarkable Adrenals/Urinary Tract: Adrenal glands are unremarkable. Simple cortical cysts are seen within the kidneys bilaterally for which no follow-up imaging is recommended. Punctate 2-3 mm nonobstructing calculi are seen within the left kidney. The kidneys are otherwise unremarkable. Bladder unremarkable. Stomach/Bowel: No active gastrointestinal hemorrhage. Severe distal descending and sigmoid colonic diverticulosis. Stomach, small bowel, and large bowel are otherwise unremarkable. Appendix absent. No evidence of obstruction or focal inflammation. No free intraperitoneal gas or fluid. Lymphatic: 18 mm rounded enhancing masses within the gastrohepatic ligament is indeterminate, possibly representing a gastric attic lymph node or an exophytic nodule arising from the left hepatic lobe, but appears stable since remote prior CT examination of 08/15/2016 and is likely benign given its stability over time. No additional pathologic adenopathy within the abdomen and pelvis. Reproductive: Prostate is unremarkable. Other: No abdominal wall hernia or abnormality. No abdominopelvic  ascites. Musculoskeletal: No acute bone abnormality. No lytic or blastic bone lesion. Osseous structures are age appropriate. IMPRESSION: 1. No active gastrointestinal hemorrhage. 2. Extensive atherosclerotic calcification of the abdominal vasculature without evidence of aneurysm or dissection. 3. 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 4. Severe distal colonic diverticulosis without evidence of acute diverticulitis. 5. 2-3 mm nonobstructing left nephrolithiasis. 6. 18 mm rounded enhancing mass within the gastrohepatic ligament, stable since remote prior CT examination of 08/15/2016 and likely benign given its stability over time. This may represent a gastrohepatic lymph node or an exophytic nodule arising from the left hepatic lobe, but appears stable since remote prior CT examination of 08/15/2016 and is likely benign given its stability over time. Aortic Atherosclerosis (ICD10-I70.0) and Emphysema (ICD10-J43.9). Electronically Signed   By: Worthy Heads M.D.   On: 01/12/2024 22:16    EKG: I independently viewed the EKG done and my findings are as followed: EKG was not done in the ED  Assessment/Plan Present on Admission:  GI bleed  Hypokalemia  Essential hypertension  Mixed hyperlipidemia  COPD (chronic obstructive pulmonary disease) (HCC)  Principal Problem:   GI bleed Active Problems:   Essential hypertension   Mixed hyperlipidemia   COPD (chronic obstructive pulmonary disease) (HCC)   Hypokalemia   Hypoalbuminemia due to protein-calorie malnutrition (HCC)  GI bleed possibly due to acute diverticulosis H/H= 11.2/36.1, this was 12.6/40.1 on 12/07/2023 Gastroenterologist  was consulted and will see patient in the morning per EDP  Hypokalemia K+ 3.2, this was replenished  Hypoalbuminemia possibly secondary to mild protein calorie malnutrition Albumin 3.2, protein supplement will be provided  Essential hypertension Continue  losartan   Mixed hyperlipidemia Continue Lipitor  COPD Continue DuoNeb as needed  GERD Continue Protonix   DVT prophylaxis: SCDs  Code Status: Full code  Family Communication: Wife at bedside (all questions answered to satisfaction)  Consults: Gastroenterology  Severity of Illness: The appropriate patient status for this patient is INPATIENT. Inpatient status is judged to be reasonable and necessary in order to provide the required intensity of service to ensure the patient's safety. The patient's presenting symptoms, physical exam findings, and initial radiographic and laboratory data in the context of their chronic comorbidities is felt to place them at high risk for further clinical deterioration. Furthermore, it is not anticipated that the patient will be medically stable for discharge from the hospital within 2 midnights of admission.   * I certify that at the point of admission it is my clinical judgment that the patient will require inpatient hospital care spanning beyond 2 midnights from the point of admission  due to high intensity of service, high risk for further deterioration and high frequency of surveillance required.*  Author: Jaila Schellhorn, DO 01/13/2024 6:27 AM  For on call review [email or web address].

## 2024-01-13 NOTE — Consult Note (Addendum)
 Gastroenterology Consult   Referring Provider: No ref. provider found Primary Care Physician:  Omie Bickers, MD Primary Gastroenterologist:  Dr. Alita Irwin  Patient ID: Jeremy [name]; [id number]; 12-25-1935   Admit date: 01/12/2024  LOS: 0 days   Date of Consultation: 01/13/2024  Reason for Consultation:  rectal bleeding    History of Present Illness   Jeremy [name] is a 88 y.o. male with PMH of CAD with prior MI, COPD, diverticular bleeding, HTN, HLD, sleep apnea presenting to ED with multiple episodes of rectal bleeding.   ED course: VSS. K 3.2, BUN 11, Hgb 11.2 (down from 12.6 on 11/2023). CTA GI bleed scan with no active GI hemorrhage noted. SMA origin with >75% stenosis, distally widely patent. IMA and celiac patent. Severe distal colonic diverticulosis. 18 mm rounded enhancing mass within gastrohepatic ligament, stable since 08/2016, likely benign.   GI consult:  Patient woke up yesterday morning with urge to have a bowel movement.  Past stool mixed with a lot of fresh blood.  Stool was dark in spots but no frank melena.  No associated abdominal pain.  He had about 4-5 episodes all day.  He felt like there was a lot of blood.  He had some fatigue.  Denied any lightheadedness, shortness of breath.  No nausea/vomiting.  He presented to the ED yesterday afternoon.  He states his last episode of passing blood per rectum was prior to coming to the ED between 3 and 4 PM.  No bleeding since admission.  Hemoglobin this morning down to 10.2.  Denies any NSAID or aspirin  use.  He had similar presentation back in August 2022 felt to be a diverticular bleed.  Completed EGD and colonoscopy at that time blood. Was on ASA 81mg  daily at that time.  EGD  05/2023  - Benign- appearing esophageal stenosis. Dilated. - 2 cm hiatal hernia.  - Erythematous mucosa in the antrum. Biopsied. - Normal duodenal bulb and second portion of the duodenum.   No H. Pylori bacteria in stomach, or any early cancer  changes to the stomach mucosa (Intestinal metaplasia)    EGD 04/2021 -Moderate distal esophageal stricture dilated with balloon dilated to 18 mm. -Small sliding hiatal hernia. -Normal examination stomach first and second part of duodenum.   Colonoscopy  04/2021 -completed with ultraslim scope. -Numerous diverticula at sigmoid colon with noncritical stricture at 30 cm from the anal margin traversed with ultraslim scope. -No polyps or other abnormalities noted. -Anal papillae       Prior to Admission medications   Medication Sig Start Date End Date Taking? Authorizing Provider  Acetaminophen  (TYLENOL  8 HOUR PO) Take by mouth. Pt is taking 500mg     [provider]  atorvastatin  (LIPITOR) 80 MG tablet Take 1 tablet (80 mg total) by mouth at bedtime. 07/26/23   Laurann Pollock, MD  formoterol  (PERFOROMIST ) 20 MCG/2ML nebulizer solution Substituted for: Perforomist  Solution Inhale one vial in nebulizer twice a day. 12/20/23   Diamond Formica, MD  hydrOXYzine  (ATARAX ) 25 MG tablet Take 25 mg by mouth at bedtime as needed. 09/21/23   [provider]  ipratropium-albuterol  (DUONEB) 0.5-2.5 (3) MG/3ML SOLN Take 3 mLs by nebulization every 4 (four) hours as needed.    [provider]  losartan  (COZAAR ) 100 MG tablet Take 100 mg by mouth daily. 01/02/22   [provider]  nitroGLYCERIN  (NITROSTAT ) 0.4 MG SL tablet PLACE 1 TABLET UNDER THE TONGUE EVERY 5 MINUTES AS NEEDED FOR CHEST PAIN. MAX  3 TABLETS PER EVENT. CALL 911 12/24/22   Laurann Pollock, MD  pantoprazole  (PROTONIX ) 40 MG tablet TAKE 1 TABLET(40 MG) BY MOUTH TWICE DAILY 10/01/23   Laurann Pollock, MD  revefenacin  (YUPELRI ) 175 MCG/3ML nebulizer solution Take 3 mLs (175 mcg total) by nebulization daily. 10/13/23   Diamond Formica, MD    No current facility-administered medications for this encounter.   Current Outpatient Medications  Medication Sig Dispense Refill   Acetaminophen  (TYLENOL  8 HOUR PO) Take  by mouth. Pt is taking 500mg      atorvastatin  (LIPITOR) 80 MG tablet Take 1 tablet (80 mg total) by mouth at bedtime. 90 tablet 2   formoterol  (PERFOROMIST ) 20 MCG/2ML nebulizer solution Substituted for: Perforomist  Solution Inhale one vial in nebulizer twice a day. 2 mL 11   hydrOXYzine  (ATARAX ) 25 MG tablet Take 25 mg by mouth at bedtime as needed.     ipratropium-albuterol  (DUONEB) 0.5-2.5 (3) MG/3ML SOLN Take 3 mLs by nebulization every 4 (four) hours as needed.     losartan  (COZAAR ) 100 MG tablet Take 100 mg by mouth daily.     nitroGLYCERIN  (NITROSTAT ) 0.4 MG SL tablet PLACE 1 TABLET UNDER THE TONGUE EVERY 5 MINUTES AS NEEDED FOR CHEST PAIN. MAX 3 TABLETS PER EVENT. CALL 911 25 tablet 3   pantoprazole  (PROTONIX ) 40 MG tablet TAKE 1 TABLET(40 MG) BY MOUTH TWICE DAILY 180 tablet 3   revefenacin  (YUPELRI ) 175 MCG/3ML nebulizer solution Take 3 mLs (175 mcg total) by nebulization daily. 90 mL 11    Allergies as of 01/12/2024 - Review Complete 01/12/2024  Allergen Reaction Noted   Ibuprofen   05/31/2023    Past Medical History:  Diagnosis Date   Abnormal CXR 4/20011   Chest  CT mode/serere COPD ( no mediastinal abnormality)   CAD (coronary artery disease) 2005   anterior MI with stent to the LAD in 1991 / stent 1996 / nuclear 2005, no ischemia   Carotid bruit    Doppler, June, 2013, 0-39% bilateral   COPD (chronic obstructive pulmonary disease) (HCC)    Diverticula of colon    Ejection fraction    55-60%, echo, April, 2011, could not estimate RV pressure   Esophagitis    Gastroenteritis    HOH (hard of hearing)    Hyperlipidemia    Hypertension    Myocardial infarction (HCC)    x2   Shortness of breath    with exertion   Sleep apnea    Stop Bang score of 5   Tobacco abuse     Past Surgical History:  Procedure Laterality Date   APPENDECTOMY     BIOPSY  11/17/2017   Procedure: BIOPSY;  Surgeon: Ruby Corporal, MD;  [facility];  Service: Endoscopy;;  gastric    BIOPSY  06/11/2023   Procedure: BIOPSY;  Surgeon: Hargis Lias, MD;  [facility];  Service: Endoscopy;;   CARDIAC CATHETERIZATION     CATARACT EXTRACTION Left    CATARACT EXTRACTION W/PHACO Right 01/22/2014   Procedure: CATARACT EXTRACTION PHACO AND INTRAOCULAR LENS PLACEMENT (IOC);  Surgeon: Anner Kill, MD;  Location: [facility];  Service: Ophthalmology;  Laterality: Right;  CDE 7.46   CHOLECYSTECTOMY     8 years ago [facility]   COLONOSCOPY  10/05/2012   Dr. Homero Luster: multiple diverticula at sigmoid colon, multiple polyps (tubular adenomas), small external hemorrhoids   COLONOSCOPY N/A 11/18/2017   Procedure: COLONOSCOPY;  Surgeon: Ruby Corporal, MD;  [facility];  Service:  Endoscopy;  Laterality: N/A;   COLONOSCOPY WITH PROPOFOL  N/A 05/07/2021   Procedure: COLONOSCOPY WITH PROPOFOL ;  Surgeon: Ruby Corporal, MD;  [facility];  Service: Endoscopy;  Laterality: N/A;   CYST REMOVAL HAND     right wrist   ESOPHAGEAL DILATION N/A 05/23/2015   Procedure: ESOPHAGEAL DILATION;  Surgeon: Suzette Espy, MD;  [facility];  Service: Endoscopy;  Laterality: N/A;   ESOPHAGEAL DILATION N/A 11/17/2017   Procedure: ESOPHAGEAL DILATION;  Surgeon: Ruby Corporal, MD;  [facility];  Service: Endoscopy;  Laterality: N/A;   ESOPHAGEAL DILATION N/A 05/07/2021   Procedure: ESOPHAGEAL DILATION;  Surgeon: Ruby Corporal, MD;  [facility];  Service: Endoscopy;  Laterality: N/A;   ESOPHAGOGASTRODUODENOSCOPY N/A 05/23/2015   Dr. Riley Cheadle: Ulcerative/ erosive reflux esophagitis with peptic stricture formation. Status post dilation as described. Hiatal hernia. Abnormal gastric mucosa of uncertain significance status post gastric biospy. +H.pylori   ESOPHAGOGASTRODUODENOSCOPY N/A 11/17/2017   Procedure: ESOPHAGOGASTRODUODENOSCOPY (EGD);  Surgeon: Ruby Corporal, MD;  [facility];  Service: Endoscopy;  Laterality: N/A;   ESOPHAGOGASTRODUODENOSCOPY  (EGD) WITH PROPOFOL  N/A 05/07/2021   Procedure: ESOPHAGOGASTRODUODENOSCOPY (EGD) WITH PROPOFOL ;  Surgeon: Ruby Corporal, MD;  [facility];  Service: Endoscopy;  Laterality: N/A;   ESOPHAGOGASTRODUODENOSCOPY (EGD) WITH PROPOFOL  N/A 06/11/2023   Procedure: ESOPHAGOGASTRODUODENOSCOPY (EGD) WITH PROPOFOL ;  Surgeon: Hargis Lias, MD;  [facility];  Service: Endoscopy;  Laterality: N/A;  8:45AM;ASA 3   PERCUTANEOUS STENT INTERVENTION     SKIN CANCER EXCISION  2015   ear & neck   TONSILLECTOMY      Family History  Problem Relation Age of Onset   Heart disease Mother    Heart disease Father    Heart disease Brother    Stomach cancer Brother    Colon cancer Neg Hx     Social History   Socioeconomic History   Marital status: Married    Spouse name: Not on file   Number of children: Not on file   Years of education: Not on file   Highest education level: Not on file  Occupational History   Not on file  Tobacco Use   Smoking status: Former    Current packs/day: 0.00    Average packs/day: 3.0 packs/day for 12.0 years (36.0 ttl pk-yrs)    Types: Cigarettes    Start date: 09/15/1983    Quit date: 09/14/1988    Years since quitting: 35.3   Smokeless tobacco: Current    Types: Chew   Tobacco comments:    chews 80% of a pack tobacco per day -- 10/12 doesnt chew too much  Vaping Use   Vaping status: Never Used  Substance and Sexual Activity   Alcohol use: No    Alcohol/week: 0.0 standard drinks of alcohol   Drug use: No   Sexual activity: Yes    Partners: Female  Other Topics Concern   Not on file  Social History Narrative   Not on file   Social Drivers of Health   Financial Resource Strain: Not on file  Food Insecurity: No Food Insecurity (03/17/2023)   Hunger Vital Sign    Worried About Running Out of Food in the Last Year: Never true    Ran Out of Food in the Last Year: Never true  Transportation Needs: No Transportation Needs (03/17/2023)   PRAPARE  - Administrator, Civil Service (Medical): No  Lack of Transportation (Non-Medical): No  Physical Activity: Not on file  Stress: Not on file  Social Connections: Not on file  Intimate Partner Violence: Not At Risk (03/17/2023)   Humiliation, Afraid, Rape, and Kick questionnaire    Fear of Current or Ex-Partner: No    Emotionally Abused: No    Physically Abused: No    Sexually Abused: No     Review of System:   General: Negative for anorexia, weight loss, fever, chills, +fatigue, weakness. Eyes: Negative for vision changes.  ENT: Negative for hoarseness, difficulty swallowing, nasal congestion. CV: Negative for chest pain, angina, palpitations, dyspnea on exertion, peripheral edema.  Respiratory: Negative for dyspnea at rest, dyspnea on exertion, cough, sputum, wheezing.  GI: See history of present illness. GU:  Negative for dysuria, hematuria, urinary incontinence, urinary frequency, nocturnal urination.  MS: Negative for joint pain, low back pain.  Derm: Negative for rash or itching.  Neuro: Negative for weakness, abnormal sensation, seizure, frequent headaches, memory loss, confusion.  Psych: Negative for anxiety, depression, suicidal ideation, hallucinations.  Endo: Negative for unusual weight change.  Heme: Negative for bruising or bleeding. Allergy: Negative for rash or hives.      Physical Examination:   Vital signs in last 24 hours: Temp:  [98.4 F (36.9 C)] 98.4 F (36.9 C) (04/30 1544) Pulse Rate:  [60-95] 62 (05/01 0201) Resp:  [16-22] 22 (05/01 0050) BP: (119-162)/(46-88) 134/64 (05/01 0200) SpO2:  [88 %-99 %] 95 % (05/01 0201) Weight:  [84.4 kg] 84.4 kg (04/30 1542)    General: Well-nourished, well-developed in no acute distress.  Head: Normocephalic, atraumatic.   Eyes: Conjunctiva pink, no icterus. Mouth: Oropharyngeal mucosa moist and pink  Neck: Supple without thyromegaly, masses, or lymphadenopathy.  Lungs: Clear to auscultation  bilaterally.  Heart: Regular rate and rhythm, no murmurs rubs or gallops.  Abdomen: Bowel sounds are normal, nontender, nondistended, no hepatosplenomegaly or masses, no abdominal bruits or hernia , no rebound or guarding.   Rectal: not performed Extremities: No lower extremity edema, clubbing, deformity.  Neuro: Alert and oriented x 4 , grossly normal neurologically.  Skin: Warm and dry, no rash or jaundice.   Psych: Alert and cooperative, normal mood and affect.        Intake/Output from previous day: No intake/output data recorded. Intake/Output this shift: No intake/output data recorded.  Lab Results:   CBC Recent Labs    01/12/24 1604  WBC 4.3  HGB 11.2*  HCT 36.1*  MCV 83.6  PLT 170   BMET Recent Labs    01/12/24 1604  NA 141  K 3.2*  CL 107  CO2 27  GLUCOSE 124*  BUN 11  CREATININE 1.21  CALCIUM  8.4*   LFT Recent Labs    01/12/24 1604  BILITOT 1.0  ALKPHOS 84  AST 14*  ALT 9  PROT 5.7*  ALBUMIN 3.2*    Lipase No results for input(s): "LIPASE" in the last 72 hours.  PT/INR No results for input(s): "LABPROT", "INR" in the last 72 hours.   Hepatitis Panel No results for input(s): "HEPBSAG", "HCVAB", "HEPAIGM", "HEPBIGM" in the last 72 hours.   Imaging Studies:   CT ANGIO GI BLEED Result Date: 01/12/2024 CLINICAL DATA:  Lower gastrointestinal hemorrhage EXAM: CTA ABDOMEN AND PELVIS WITHOUT AND WITH CONTRAST TECHNIQUE: Multidetector CT imaging of the abdomen and pelvis was performed using the standard protocol during bolus administration of intravenous contrast. Multiplanar reconstructed images and MIPs were obtained and reviewed to evaluate the vascular anatomy. RADIATION DOSE  REDUCTION: This exam was performed according to the departmental dose-optimization program which includes automated exposure control, adjustment of the mA and/or kV according to patient size and/or use of iterative reconstruction technique. CONTRAST:  OMNIPAQUE  IOHEXOL   350 MG/ML SOLN COMPARISON:  None Available. FINDINGS: VASCULAR Aorta: Normal caliber aorta without aneurysm, dissection, vasculitis or significant stenosis. Extensive atherosclerotic calcification Celiac: Patent without evidence of aneurysm, dissection, vasculitis or significant stenosis. Replaced left hepatic artery. 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 Distally widely patent. Renals: Both renal arteries are patent without evidence of aneurysm, dissection, vasculitis, fibromuscular dysplasia or significant stenosis. IMA: Patent without evidence of aneurysm, dissection, vasculitis or significant stenosis. Inflow: Patent without evidence of aneurysm, dissection, vasculitis or significant stenosis. Proximal Outflow: Bilateral common femoral and visualized portions of the superficial and profunda femoral arteries are patent without evidence of aneurysm, dissection, vasculitis or significant stenosis. Veins: Unremarkable Review of the MIP images confirms the above findings. NON-VASCULAR Lower chest: Advanced emphysema no acute abnormality. Hepatobiliary: No focal liver abnormality is seen. Status post cholecystectomy. No biliary dilatation. Pancreas: Unremarkable Spleen: Unremarkable Adrenals/Urinary Tract: Adrenal glands are unremarkable. Simple cortical cysts are seen within the kidneys bilaterally for which no follow-up imaging is recommended. Punctate 2-3 mm nonobstructing calculi are seen within the left kidney. The kidneys are otherwise unremarkable. Bladder unremarkable. Stomach/Bowel: No active gastrointestinal hemorrhage. Severe distal descending and sigmoid colonic diverticulosis. Stomach, small bowel, and large bowel are otherwise unremarkable. Appendix absent. No evidence of obstruction or focal inflammation. No free intraperitoneal gas or fluid. Lymphatic: 18 mm rounded enhancing masses within the gastrohepatic ligament is indeterminate, possibly representing  a gastric attic lymph node or an exophytic nodule arising from the left hepatic lobe, but appears stable since remote prior CT examination of 08/15/2016 and is likely benign given its stability over time. No additional pathologic adenopathy within the abdomen and pelvis. Reproductive: Prostate is unremarkable. Other: No abdominal wall hernia or abnormality. No abdominopelvic ascites. Musculoskeletal: No acute bone abnormality. No lytic or blastic bone lesion. Osseous structures are age appropriate. IMPRESSION: 1. No active gastrointestinal hemorrhage. 2. Extensive atherosclerotic calcification of the abdominal vasculature without evidence of aneurysm or dissection. 3. 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 4. Severe distal colonic diverticulosis without evidence of acute diverticulitis. 5. 2-3 mm nonobstructing left nephrolithiasis. 6. 18 mm rounded enhancing mass within the gastrohepatic ligament, stable since remote prior CT examination of 08/15/2016 and likely benign given its stability over time. This may represent a gastrohepatic lymph node or an exophytic nodule arising from the left hepatic lobe, but appears stable since remote prior CT examination of 08/15/2016 and is likely benign given its stability over time. Aortic Atherosclerosis (ICD10-I70.0) and Emphysema (ICD10-J43.9). Electronically Signed   By: Worthy Heads M.D.   On: 01/12/2024 22:16  [4 week]  Assessment:   88 yo male with PMH of CAD with prior MI, COPD, diverticular bleeding, HTN, HLD, sleep apnea presenting to ED with multiple episodes of rectal bleeding starting yesterday morning.   Rectal bleeding: acute onset painless rectal bleeding started yesterday morning. Had 4-5 episodes of moderate volume bleeding. Similar episode in 2022, thought to be diverticular bleeding at that time. His Hgb has drop 1g/dL since admission. He has not had any bleeding since presentation. I suspect he has had  a self limited diverticular bleed.   Plan:   I would anticipate no further intervention but will discuss with Dr. Alita Irwin before  advancing his diet.  Continue to monitor for active bleeding.    LOS: 0 days   We would like to thank you for the opportunity to participate in the care of ANGELDEJESUS VANAMBURG.  Trudie Fuse. Verlie Glisson [facility] [phone] 5/1/20255:43 AM  Addendum: Discussed with Dr. Alita Irwin, patient, and wife. It appears that his bleeding has stopped, no overt GI bleeding since yesterday afternoon. Mild drop in Hgb. He had similar bleeding also in 04/2023 (was on Eliquis  at that time). Recently cardiologist advised him to add ASA 81mg  daily, patient has small brbpr and subsequently stopped the aspirin . We discussed this is likely a diverticular bleed and will likely stop on it's own. Cannot rule out underlying malignancy with colonoscopy 2022 but overall CTA was reassuring. Right now patient and wife are thinking watching patient since episode seems to have stopped but if any rebleeding then consider colonoscopy.   Trudie Fuse. Verlie Glisson [facility] [phone] 5/1/202511:28 AM

## 2024-01-13 NOTE — Plan of Care (Signed)

## 2024-01-13 NOTE — Progress Notes (Signed)
 Patient seen and examined personally, I reviewed the chart, history and physical and admission note, done by admitting physician this morning and agree with the same with following addendum.  Please refer to the morning admission note for more detailed plan of care.  Briefly,    PROGRESS NOTE Jeremy [name]  EXB:[id number] DOB: [birth year] DOA: 01/12/2024 PCP: Omie Bickers, MD  Brief Narrative/Hospital Course: 63 yom w/  HTN HLD, MI/CAD W/ prior GI bleed, diverticulosis wHo presented to the ED due to rectal bleed with bright red blood in the commode noticed on 4/76morning between 7:30 AM to 8 AM and continued to have episodes of this every 2-3 hours with last episode being between 3 to 4 PM  and brought to the ED  where vitals stable labs showed normocytic anemia and normal BMP except for potassium of 3.2, blood glucose 124, albumin 3.2. CT abdomen and pelvis without and with contrast>>no active GI hemorrhage, but showed severe distal colonic diverticulosis without evidence of acute diverticulitisGastroenterologist on-call (Dr. Riley Cheadle) was consulted and patient was admitted for further management   Subjective: Seen and examined this morning No nausea vomiting abdominal pain.  Wife at the bedside Same day admit note Overnight afebrile BP 170s on room air Labs this morning potassium 3.2 hemoglobin 11.2> 10.2  Assessment and plan:  Rectal bleeding Acute on chronic anemia: Suspect diverticular bleeding.  GI has been consulted hemoglobin slightly downtrending, trend H&H, avoid anticoagulants, await further GI input Recent Labs  Lab 01/12/24 1604 01/13/24 0631  HGB 11.2* 10.2*  HCT 36.1* 32.3*    Hypokalemia: Replaced   Hypoalbuminemia: Albumin 3.2, augment nutrition    Essential hypertension BP on higher side, continue losartan    Mixed hyperlipidemia Continue Lipitor   COPD Continue DuoNeb as needed   GERD Continue Protonix   DVT prophylaxis: SCDs Start: 01/13/24 0618 Code  Status:   Code Status: Full Code Family Communication: plan of care discussed with patient/wife at bedside. Patient status is: Remains hospitalized because of severity of illness Level of care: Med-Surg   Dispo: The patient is from: home            Anticipated disposition: TBD Objective: Vitals last 24 hrs: Vitals:   01/13/24 0721 01/13/24 0800 01/13/24 0815 01/13/24 0900  BP: (!) 177/73 (!) 153/70  (!) 156/70  Pulse: 63 (!) 56  78  Resp: 17     Temp: (!) 97.5 F (36.4 C)     TempSrc: Oral     SpO2: 96%  92% 93%  Weight:      Height:        Physical Examination: General exam: alert awake, older than stated age HEENT:Oral mucosa moist, Ear/Nose WNL grossly Respiratory system: B.L clear BS, no use of accessory muscle Cardiovascular system: S1 & S2 +. Gastrointestinal system: Abdomen soft,  NT,ND,BS+ Nervous System: Alert, awake,  following commands. Extremities: LE edema neg, warm extremities Skin: No rashes,warm. MSK: Normal muscle bulk/tone.   Data Reviewed: I have personally reviewed following labs and imaging studies ( see epic result tab) CBC: Recent Labs  Lab 01/12/24 1604 01/13/24 0631  WBC 4.3 3.9*  HGB 11.2* 10.2*  HCT 36.1* 32.3*  MCV 83.6 83.5  PLT 170 155   CMP: Recent Labs  Lab 01/12/24 1604 01/13/24 0631  NA 141 142  K 3.2* 3.2*  CL 107 108  CO2 27 27  GLUCOSE 124* 81  BUN 11 13  CREATININE 1.21 1.02  CALCIUM  8.4* 7.9*  MG  --  1.9  PHOS  --  3.2   GFR: Estimated Creatinine Clearance: 57.1 mL/min (by C-G formula based on SCr of 1.02 mg/dL). Recent Labs  Lab 01/12/24 1604 01/13/24 0631  AST 14* 13*  ALT 9 10  ALKPHOS 84 74  BILITOT 1.0 1.3*  PROT 5.7* 5.2*  ALBUMIN 3.2* 3.0*   No results for input(s): "LIPASE", "AMYLASE" in the last 168 hours. No results for input(s): "AMMONIA" in the last 168 hours. Coagulation Profile: No results for input(s): "INR", "PROTIME" in the last 168 hours. Unresulted Labs (From admission, onward)     None      Antimicrobials/Microbiology: Anti-infectives (From admission, onward)    None         Component Value Date/Time   SDES  04/17/2019 1311    IN/OUT CATH URINE Performed at [facility], [address], [place], [place] [address]    [facility]  04/17/2019 1311    NONE Performed at [facility], [address], [place], [place] [address]    CULT  04/17/2019 1311    NO GROWTH Performed at [facility], [address] [place], [place] [address]    REPTSTATUS 04/18/2019 FINAL 04/17/2019 1311    Medications reviewed:  Scheduled Meds:  atorvastatin   80 mg Oral QHS   feeding supplement  237 mL Oral BID BM   losartan   100 mg Oral Daily   pantoprazole   40 mg Oral BID   potassium chloride   40 mEq Oral Q3H   Continuous Infusions:  Lesa Rape, MD [facility] 01/13/2024, 11:16 AM

## 2024-01-14 DIAGNOSIS — K922 Gastrointestinal hemorrhage, unspecified: Secondary | ICD-10-CM | POA: Diagnosis not present

## 2024-01-14 DIAGNOSIS — K625 Hemorrhage of anus and rectum: Secondary | ICD-10-CM | POA: Diagnosis not present

## 2024-01-14 DIAGNOSIS — K921 Melena: Principal | ICD-10-CM

## 2024-01-14 LAB — CBC
HCT: 30.1 % — ABNORMAL LOW (ref 39.0–52.0)
Hemoglobin: 9.5 g/dL — ABNORMAL LOW (ref 13.0–17.0)
MCH: 26.5 pg (ref 26.0–34.0)
MCHC: 31.6 g/dL (ref 30.0–36.0)
MCV: 83.8 fL (ref 80.0–100.0)
Platelets: 159 10*3/uL (ref 150–400)
RBC: 3.59 MIL/uL — ABNORMAL LOW (ref 4.22–5.81)
RDW: 14.4 % (ref 11.5–15.5)
WBC: 4.4 10*3/uL (ref 4.0–10.5)
nRBC: 0 % (ref 0.0–0.2)

## 2024-01-14 LAB — BASIC METABOLIC PANEL WITH GFR
Anion gap: 5 (ref 5–15)
BUN: 12 mg/dL (ref 8–23)
CO2: 27 mmol/L (ref 22–32)
Calcium: 8.2 mg/dL — ABNORMAL LOW (ref 8.9–10.3)
Chloride: 107 mmol/L (ref 98–111)
Creatinine, Ser: 1.03 mg/dL (ref 0.61–1.24)
GFR, Estimated: >60 mL/min (ref >60)
Glucose, Bld: 94 mg/dL (ref 70–99)
Potassium: 3.6 mmol/L (ref 3.5–5.1)
Sodium: 139 mmol/L (ref 135–145)

## 2024-01-14 MED ORDER — PEG 3350-KCL-NA BICARB-NACL 420 G PO SOLR
4000.0000 mL | Freq: Once | ORAL | Status: AC
  Administered 2024-01-14: 4000 mL via ORAL

## 2024-01-14 MED ORDER — BISACODYL 5 MG PO TBEC
10.0000 mg | DELAYED_RELEASE_TABLET | Freq: Once | ORAL | Status: AC
  Administered 2024-01-14: 10 mg via ORAL
  Filled 2024-01-14: qty 2

## 2024-01-14 NOTE — Plan of Care (Signed)

## 2024-01-14 NOTE — Progress Notes (Signed)
 Gastroenterology Progress Note   Referring Provider: No ref. provider found Primary Care Physician:  Omie Bickers, MD Primary Gastroenterologist:  Dr. Alita Irwin  Patient ID: Jeremy [name]; [id number]; June 23, 1936    Subjective   Patient feeling well overall today.  Denies any abdominal pain, nausea, vomiting.  Reports a small amount of rectal bleeding yesterday.  Already had bowel movement this morning with no signs of blood present.  Appetite is good overall.  Has had some intermittent shortness of breath but has been using his baseline breathing treatments that he uses at home.   Objective   Vital signs in last 24 hours Temp:  [98 F (36.7 C)-98.6 F (37 C)] 98.4 F (36.9 C) (05/02 0325) Pulse Rate:  [65-79] 68 (05/02 0325) Resp:  [20] 20 (05/02 0325) BP: (133-147)/(52-76) 133/52 (05/02 0325) SpO2:  [90 %-97 %] 90 % (05/02 0834) Weight:  [82.3 kg] 82.3 kg (05/01 1300) Last BM Date : 01/14/24  Physical Exam General:   Alert and oriented, pleasant Head:  Normocephalic and atraumatic. Eyes:  No icterus, sclera clear. Conjuctiva pink.  Neck:  Supple, without thyromegaly or masses.  Heart:  S1, S2 present, no murmurs noted.  Lungs: Clear to auscultation bilaterally, without wheezing, rales, or rhonchi.  Abdomen:  Bowel sounds present, soft, non-tender, non-distended. No HSM or hernias noted. No rebound or guarding. No masses appreciated  Neurologic:  Alert and  oriented x4;  grossly normal neurologically. Skin:  Warm and dry, intact without significant lesions.  Psych:  Alert and cooperative. Normal mood and affect.  Intake/Output from previous day: 05/01 0701 - 05/02 0700 In: 240 [P.O.:240] Out: -  Intake/Output this shift: No intake/output data recorded.  Lab Results  Recent Labs    01/12/24 1604 01/13/24 0631 01/13/24 1920 01/14/24 0443  WBC 4.3 3.9*  --  4.4  HGB 11.2* 10.2* 10.3* 9.5*  HCT 36.1* 32.3* 33.1* 30.1*  PLT 170 155  --  159   BMET Recent Labs     01/12/24 1604 01/13/24 0631 01/14/24 0443  NA 141 142 139  K 3.2* 3.2* 3.6  CL 107 108 107  CO2 27 27 27   GLUCOSE 124* 81 94  BUN 11 13 12   CREATININE 1.21 1.02 1.03  CALCIUM  8.4* 7.9* 8.2*   LFT Recent Labs    01/12/24 1604 01/13/24 0631  PROT 5.7* 5.2*  ALBUMIN 3.2* 3.0*  AST 14* 13*  ALT 9 10  ALKPHOS 84 74  BILITOT 1.0 1.3*   PT/INR No results for input(s): "LABPROT", "INR" in the last 72 hours. Hepatitis Panel No results for input(s): "HEPBSAG", "HCVAB", "HEPAIGM", "HEPBIGM" in the last 72 hours.  Studies/Results CT ANGIO GI BLEED Result Date: 01/12/2024 CLINICAL DATA:  Lower gastrointestinal hemorrhage EXAM: CTA ABDOMEN AND PELVIS WITHOUT AND WITH CONTRAST TECHNIQUE: Multidetector CT imaging of the abdomen and pelvis was performed using the standard protocol during bolus administration of intravenous contrast. Multiplanar reconstructed images and MIPs were obtained and reviewed to evaluate the vascular anatomy. RADIATION DOSE REDUCTION: This exam was performed according to the departmental dose-optimization program which includes automated exposure control, adjustment of the mA and/or kV according to patient size and/or use of iterative reconstruction technique. CONTRAST:  OMNIPAQUE  IOHEXOL  350 MG/ML SOLN COMPARISON:  None Available. FINDINGS: VASCULAR Aorta: Normal caliber aorta without aneurysm, dissection, vasculitis or significant stenosis. Extensive atherosclerotic calcification Celiac: Patent without evidence of aneurysm, dissection, vasculitis or significant stenosis. Replaced left hepatic artery. 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 Distally widely patent. Renals: Both renal arteries are patent without evidence of aneurysm, dissection, vasculitis, fibromuscular dysplasia or significant stenosis. IMA: Patent without evidence of aneurysm, dissection, vasculitis or significant stenosis. Inflow: Patent without evidence  of aneurysm, dissection, vasculitis or significant stenosis. Proximal Outflow: Bilateral common femoral and visualized portions of the superficial and profunda femoral arteries are patent without evidence of aneurysm, dissection, vasculitis or significant stenosis. Veins: Unremarkable Review of the MIP images confirms the above findings. NON-VASCULAR Lower chest: Advanced emphysema no acute abnormality. Hepatobiliary: No focal liver abnormality is seen. Status post cholecystectomy. No biliary dilatation. Pancreas: Unremarkable Spleen: Unremarkable Adrenals/Urinary Tract: Adrenal glands are unremarkable. Simple cortical cysts are seen within the kidneys bilaterally for which no follow-up imaging is recommended. Punctate 2-3 mm nonobstructing calculi are seen within the left kidney. The kidneys are otherwise unremarkable. Bladder unremarkable. Stomach/Bowel: No active gastrointestinal hemorrhage. Severe distal descending and sigmoid colonic diverticulosis. Stomach, small bowel, and large bowel are otherwise unremarkable. Appendix absent. No evidence of obstruction or focal inflammation. No free intraperitoneal gas or fluid. Lymphatic: 18 mm rounded enhancing masses within the gastrohepatic ligament is indeterminate, possibly representing a gastric attic lymph node or an exophytic nodule arising from the left hepatic lobe, but appears stable since remote prior CT examination of 08/15/2016 and is likely benign given its stability over time. No additional pathologic adenopathy within the abdomen and pelvis. Reproductive: Prostate is unremarkable. Other: No abdominal wall hernia or abnormality. No abdominopelvic ascites. Musculoskeletal: No acute bone abnormality. No lytic or blastic bone lesion. Osseous structures are age appropriate. IMPRESSION: 1. No active gastrointestinal hemorrhage. 2. Extensive atherosclerotic calcification of the abdominal vasculature without evidence of aneurysm or dissection. 3. 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 4. Severe distal colonic diverticulosis without evidence of acute diverticulitis. 5. 2-3 mm nonobstructing left nephrolithiasis. 6. 18 mm rounded enhancing mass within the gastrohepatic ligament, stable since remote prior CT examination of 08/15/2016 and likely benign given its stability over time. This may represent a gastrohepatic lymph node or an exophytic nodule arising from the left hepatic lobe, but appears stable since remote prior CT examination of 08/15/2016 and is likely benign given its stability over time. Aortic Atherosclerosis (ICD10-I70.0) and Emphysema (ICD10-J43.9). Electronically Signed   By: Worthy Heads M.D.   On: 01/12/2024 22:16    Assessment  88 y.o. male with a history of MI, COPD, diverticular bleed, HTN, HLD, sleep apnea who presented to the ED with multiple episodes of rectal bleeding which started on Wednesday morning.  GI consulted for further evaluation and management.  Rectal bleeding: Acute onset painless bleeding which started on Wednesday.  He reports moderate to large amount occurring 4-5 times.  He previously reported a similar episode in 2022 that was felt to be a diverticular bleed.  Last colonoscopy in October 2024 with numerous diverticula in the sigmoid colon and a noncritical stricture 30 cm from anal margin which was traversed with ultraslim scope.  Continue to suspect self-limiting diverticular bleed however he did have a mild amount of blood yesterday although none this morning with bowel movement.  Despite no overt bleeding, he continues to have a slow decline in his hemoglobin.  Given the slow decline, discussed with Dr. Sammi Crick who has suggested colonoscopy while inpatient, spoke with daughter and patient about this and they have agreed to proceed with repeat colonoscopy which we will perform tomorrow.  He is aware that Dr. Homero Luster will not  be performing the procedure, that Dr. Sammi Crick  will be.   I have discussed the risks, alternatives, benefits with regards to but not limited to the risk of reaction to medication, bleeding, infection, perforation and the patient is agreeable to proceed. Written consent to be obtained.   Plan / Recommendations  Clear liquids today N.p.o. midnight Colonoscopy tomorrow with Dr. Sammi Crick. Continue to hold low-dose aspirin  currently while inpatient.    LOS: 1 day    01/14/2024, 12:02 PM   Julian Obey, MSN, FNP-BC, AGACNP-BC [facility]

## 2024-01-14 NOTE — Care Management Important Message (Signed)
 Important Message  Patient Details  Name: NOYAN TOLEDO MRN: [id number] Date of Birth: July 02, 1936   Important Message Given:  Yes - Medicare IM     Jaskarn Schweer L Jamiyah Dingley 01/14/2024, 2:01 PM

## 2024-01-14 NOTE — Progress Notes (Signed)
 PROGRESS NOTE Jeremy [name]  XBJ:[id number] DOB: 04-06-1936 DOA: 01/12/2024 PCP: Omie Bickers, MD  Brief Narrative/Hospital Course: 89 yom w/  HTN HLD, MI/CAD W/ prior GI bleed, diverticulosis wHo presented to the ED due to rectal bleed with bright red blood in the commode noticed on 4/26morning between 7:30 AM to 8 AM and continued to have episodes of this every 2-3 hours with last episode being between 3 to 4 PM  and brought to the ED  where vitals stable labs showed normocytic anemia and normal BMP except for potassium of 3.2, blood glucose 124, albumin 3.2. CT abdomen and pelvis without and with contrast>>no active GI hemorrhage, but showed severe distal colonic diverticulosis without evidence of acute diverticulitisGastroenterologist on-call (Dr. Riley Cheadle) was consulted and patient was admitted for further management  Subjective: Seen and examined this morning.  Resting comfortably. S/o red blood in the stool, denies nausea vomiting abdominal pain. Overnight afebrile on room air BP stable Labs this morning stable electrolytes, hemoglobin 11.2> 10.2> 10.3>9.5.  Assessment and plan:  Rectal bleeding Acute on chronic anemia: Similar presentation back in August 2022 and had upper EGD and colonoscopy at that time. Suspect diverticular bleeding and likely self-limited. GI following closely, CTA without active extravasation severe distal colonic diverticulosis noticed.  Last EGD in last September for dysphagia, heart dilatation of esophageal stenosis with improvement.  Encouraged to avoid NSAIDs, prevent constipation maintain Metamucil MiraLAX, hemoglobin overall stable although slightly down GI input appreciated, planning for clear liquid diet, n.p.o. past midnight and colonoscopy tomorrow.  Holding aspirin  Recent Labs  Lab 01/12/24 1604 01/13/24 0631 01/13/24 1920 01/14/24 0443  HGB 11.2* 10.2* 10.3* 9.5*  HCT 36.1* 32.3* 33.1* 30.1*    Hypokalemia: Resolved   Hypoalbuminemia: Albumin  3.2, augment nutrition    Essential hypertension BP remains well-controlled. Continue losartan    Mixed hyperlipidemia Continue Lipitor   COPD Continue DuoNeb as needed   GERD Continue Protonix   DVT prophylaxis: SCDs Start: 01/13/24 0618 Code Status:   Code Status: Full Code Family Communication: plan of care discussed with patient/wife at bedside. Patient status is: Remains hospitalized because of severity of illness Level of care: Med-Surg   Dispo: The patient is from: home            Anticipated disposition: Pending GI clearance.    Objective: Vitals last 24 hrs: Vitals:   01/13/24 2013 01/13/24 2045 01/14/24 0325 01/14/24 0834  BP: (!) 143/72  (!) 133/52   Pulse: 79  68   Resp: 20  20   Temp: 98.6 F (37 C)  98.4 F (36.9 C)   TempSrc: Oral     SpO2: 96% 93% 91% 90%  Weight:      Height:        Physical Examination: General exam: alert awake, oriented HEENT:Oral mucosa moist, Ear/Nose WNL grossly Respiratory system: Bilaterally clear BS,no use of accessory muscle Cardiovascular system: S1 & S2 +, No JVD. Gastrointestinal system: Abdomen soft,NT,ND, BS+ Nervous System: Alert, awake, moving all extremities,and following commands. Extremities: LE edema neg,distal peripheral pulses palpable and warm.  Skin: No rashes,no icterus. MSK: Normal muscle bulk,tone, power   Data Reviewed: I have personally reviewed following labs and imaging studies ( see epic result tab) CBC: Recent Labs  Lab 01/12/24 1604 01/13/24 0631 01/13/24 1920 01/14/24 0443  WBC 4.3 3.9*  --  4.4  HGB 11.2* 10.2* 10.3* 9.5*  HCT 36.1* 32.3* 33.1* 30.1*  MCV 83.6 83.5  --  83.8  PLT 170 155  --  159   CMP: Recent Labs  Lab 01/12/24 1604 01/13/24 0631 01/14/24 0443  NA 141 142 139  K 3.2* 3.2* 3.6  CL 107 108 107  CO2 27 27 27   GLUCOSE 124* 81 94  BUN 11 13 12   CREATININE 1.21 1.02 1.03  CALCIUM  8.4* 7.9* 8.2*  MG  --  1.9  --   PHOS  --  3.2  --    GFR: Estimated Creatinine  Clearance: 56.5 mL/min (by C-G formula based on SCr of 1.03 mg/dL). Recent Labs  Lab 01/12/24 1604 01/13/24 0631  AST 14* 13*  ALT 9 10  ALKPHOS 84 74  BILITOT 1.0 1.3*  PROT 5.7* 5.2*  ALBUMIN 3.2* 3.0*   No results for input(s): "LIPASE", "AMYLASE" in the last 168 hours. No results for input(s): "AMMONIA" in the last 168 hours. Coagulation Profile: No results for input(s): "INR", "PROTIME" in the last 168 hours. Unresulted Labs (From admission, onward)    None      Antimicrobials/Microbiology: Anti-infectives (From admission, onward)    None         Component Value Date/Time   SDES  04/17/2019 1311    IN/OUT CATH URINE Performed at [facility], [address] [place], [place] [address]    [facility]  04/17/2019 1311    NONE Performed at  [facility], [address], [place], [place] [address]    CULT  04/17/2019 1311    NO GROWTH Performed at [facility], [address] [place], [place] [address]    REPTSTATUS 04/18/2019 FINAL 04/17/2019 1311    Medications reviewed:  Scheduled Meds:  arformoterol   15 mcg Nebulization Q12H   atorvastatin   80 mg Oral QHS   bisacodyl   10 mg Oral Once   feeding supplement  237 mL Oral BID BM   losartan   100 mg Oral Daily   pantoprazole   40 mg Oral BID   polyethylene glycol-electrolytes  4,000 mL Oral Once   revefenacin   175 mcg Nebulization Daily   Continuous Infusions:  Lesa Rape, MD [facility] 01/14/2024, 1:11 PM

## 2024-01-15 ENCOUNTER — Telehealth (INDEPENDENT_AMBULATORY_CARE_PROVIDER_SITE_OTHER): Payer: Self-pay | Admitting: Gastroenterology

## 2024-01-15 ENCOUNTER — Encounter (HOSPITAL_COMMUNITY)
Admission: EM | Discharge status: Home / Self Care | Payer: Self-pay | Source: Home / Self Care | Attending: Internal Medicine

## 2024-01-15 ENCOUNTER — Inpatient Hospital Stay (HOSPITAL_COMMUNITY): Admitting: Anesthesiology

## 2024-01-15 DIAGNOSIS — D122 Benign neoplasm of ascending colon: Secondary | ICD-10-CM | POA: Diagnosis not present

## 2024-01-15 DIAGNOSIS — K922 Gastrointestinal hemorrhage, unspecified: Secondary | ICD-10-CM | POA: Diagnosis not present

## 2024-01-15 DIAGNOSIS — D123 Benign neoplasm of transverse colon: Secondary | ICD-10-CM | POA: Diagnosis not present

## 2024-01-15 DIAGNOSIS — D120 Benign neoplasm of cecum: Secondary | ICD-10-CM

## 2024-01-15 DIAGNOSIS — K5730 Diverticulosis of large intestine without perforation or abscess without bleeding: Secondary | ICD-10-CM | POA: Diagnosis not present

## 2024-01-15 LAB — CBC
HCT: 31 % — ABNORMAL LOW (ref 39.0–52.0)
Hemoglobin: 9.9 g/dL — ABNORMAL LOW (ref 13.0–17.0)
MCH: 26.8 pg (ref 26.0–34.0)
MCHC: 31.9 g/dL (ref 30.0–36.0)
MCV: 84 fL (ref 80.0–100.0)
Platelets: 156 10*3/uL (ref 150–400)
RBC: 3.69 MIL/uL — ABNORMAL LOW (ref 4.22–5.81)
RDW: 14.4 % (ref 11.5–15.5)
WBC: 3.8 10*3/uL — ABNORMAL LOW (ref 4.0–10.5)
nRBC: 0 % (ref 0.0–0.2)

## 2024-01-15 LAB — BASIC METABOLIC PANEL WITH GFR
Anion gap: 6 (ref 5–15)
BUN: 7 mg/dL — ABNORMAL LOW (ref 8–23)
CO2: 28 mmol/L (ref 22–32)
Calcium: 8 mg/dL — ABNORMAL LOW (ref 8.9–10.3)
Chloride: 108 mmol/L (ref 98–111)
Creatinine, Ser: 1.12 mg/dL (ref 0.61–1.24)
GFR, Estimated: >60 mL/min (ref >60)
Glucose, Bld: 86 mg/dL (ref 70–99)
Potassium: 3.6 mmol/L (ref 3.5–5.1)
Sodium: 142 mmol/L (ref 135–145)

## 2024-01-15 SURGERY — COLONOSCOPY
Anesthesia: General

## 2024-01-15 MED ORDER — LACTATED RINGERS IV SOLN: INTRAVENOUS | Status: DC | PRN

## 2024-01-15 MED ORDER — LIDOCAINE HCL (CARDIAC) PF 100 MG/5ML IV SOSY
PREFILLED_SYRINGE | INTRAVENOUS | Status: DC | PRN
Start: 2024-01-15 — End: 2024-01-15
  Administered 2024-01-15: 100 mg via INTRAVENOUS

## 2024-01-15 MED ORDER — PROPOFOL 10 MG/ML IV BOLUS
INTRAVENOUS | Status: AC
  Filled 2024-01-15: qty 20

## 2024-01-15 MED ORDER — LIDOCAINE 2% (20 MG/ML) 5 ML SYRINGE
INTRAMUSCULAR | Status: AC
  Filled 2024-01-15: qty 5

## 2024-01-15 MED ORDER — IPRATROPIUM-ALBUTEROL 0.5-2.5 (3) MG/3ML IN SOLN: 3.0000 mL | RESPIRATORY_TRACT | 0 refills | Status: AC | PRN

## 2024-01-15 MED ORDER — PROPOFOL 10 MG/ML IV BOLUS
INTRAVENOUS | Status: DC | PRN
  Administered 2024-01-15: 60 mg via INTRAVENOUS
  Administered 2024-01-15: 50 mg via INTRAVENOUS
  Administered 2024-01-15 (×4): 30 mg via INTRAVENOUS
  Administered 2024-01-15: 20 mg via INTRAVENOUS
  Administered 2024-01-15: 50 mg via INTRAVENOUS

## 2024-01-15 MED ORDER — LIDOCAINE HCL (CARDIAC) PF 100 MG/5ML IV SOSY: PREFILLED_SYRINGE | INTRAVENOUS | Status: DC | PRN

## 2024-01-15 NOTE — Plan of Care (Signed)

## 2024-01-15 NOTE — Plan of Care (Signed)
  Problem: Education: Goal: Knowledge of General Education information will improve Description: Including pain rating scale, medication(s)/side effects and non-pharmacologic comfort measures Outcome: Progressing   Problem: Clinical Measurements: Goal: Will remain free from infection Outcome: Progressing   Problem: Elimination: Goal: Will not experience complications related to bowel motility Outcome: Progressing   Problem: Pain Managment: Goal: General experience of comfort will improve and/or be controlled Outcome: Progressing

## 2024-01-15 NOTE — Telephone Encounter (Signed)
 Hi Amalia Badder,  Can you please schedule a follow up appointment for this patient in 3-4 weeks with Dr. Alita Irwin or any of the APPs?  Thanks,  Samantha Cress, MD Gastroenterology and Hepatology [facility]

## 2024-01-15 NOTE — Progress Notes (Signed)
We will proceed with colonoscopy as scheduled.  I thoroughly discussed with the patient the procedure, including the risks involved. Patient understands what the procedure involves including the benefits and any risks. Patient understands alternatives to the proposed procedure. Risks including (but not limited to) bleeding, tearing of the lining (perforation), rupture of adjacent organs, problems with heart and lung function, infection, and medication reactions. A small percentage of complications may require surgery, hospitalization, repeat endoscopic procedure, and/or transfusion.  Patient understood and agreed.  Jeremy Peppers, MD Gastroenterology and Hepatology [facility]

## 2024-01-15 NOTE — Progress Notes (Signed)
 Patient drank the majority of the prep. Tolerated okay. Patient up oob multiple times throughout the shift having bms. Patient noted a dot size amount of blood in his stool once. One tap water  enema given this morning, patient tolerated well.

## 2024-01-15 NOTE — Anesthesia Postprocedure Evaluation (Signed)
 Anesthesia Post Note  Patient: Jeremy [name]  Procedure(s) Performed: COLONOSCOPY  Patient location during evaluation: PACU Anesthesia Type: General Level of consciousness: awake and alert Pain management: pain level controlled Vital Signs Assessment: post-procedure vital signs reviewed and stable Respiratory status: spontaneous breathing, nonlabored ventilation, respiratory function stable and patient connected to nasal cannula oxygen  Cardiovascular status: blood pressure returned to baseline and stable Postop Assessment: no apparent nausea or vomiting Anesthetic complications: no   There were no known notable events for this encounter.   Last Vitals:  Vitals:   01/15/24 1500 01/15/24 1515  BP: (!) 154/80   Pulse: 74 67  Resp: 15 16  Temp: 36.7 C   SpO2: 98% 96%    Last Pain:  Vitals:   01/15/24 1515  TempSrc:   PainSc: 0-No pain                 Jeremy [name]

## 2024-01-15 NOTE — Brief Op Note (Addendum)
 01/15/2024  2:57 PM  PATIENT:  Jeremy [name]  88 y.o. male  PRE-OPERATIVE DIAGNOSIS:  anemia, rectal bleeding.  POST-OPERATIVE DIAGNOSIS:  left-sided diverticulosis;  PROCEDURE:  Procedure(s): COLONOSCOPY (N/A)  SURGEON:  Surgeons and Role:    * Urban Garden, MD - Primary  Patient underwent colonoscopy under propofol  sedation.  Tolerated the procedure adequately.   FINDINGS: - Preparation of the colon was fair.  - Five 1 to 2 mm polyps in the transverse colon, in the ascending colon and in the cecum, removed with a cold snare.  Resected and retrieved.  - Diverticulosis in the sigmoid colon and in the descending colon.  - The distal rectum and anal verge are normal on retroflexion view.   RECOMMENDATIONS - Return patient to hospital ward for ongoing care. Can be discharged home today. - Resume previous diet.  - Await pathology results.  - Repeat colonoscopy is not recommended due to current age (78 years or older) for screening purposes.  - Patient will follow up in GI clinic  in 3-4 weeks. - GI service will sign-off, please call us  back if you have any more questions.  Samantha Cress, MD Gastroenterology and Hepatology [facility]

## 2024-01-15 NOTE — Discharge Summary (Signed)
 Physician Discharge Summary  Jeremy [name] FAO:[id number] DOB: July 06, 1936 DOA: 01/12/2024  PCP: Omie Bickers, MD  Admit date: 01/12/2024  Discharge date: 01/15/2024  Admitted From:Home  Disposition:  Home  Recommendations for Outpatient Follow-up:  Follow up with PCP in 1-2 weeks Follow-up with GI outpatient in the next 3-4 weeks to follow-up with GI results Continue on home medications as prior  Home Health: None   Equipment/Devices: None  Discharge Condition:Stable  CODE STATUS: Full  Diet recommendation: Heart Healthy  Brief/Interim Summary: 68 yom w/ HTN HLD, MI/CAD W/ prior GI bleed, diverticulosis wHo presented to the ED due to rectal bleed.  He was admitted with acute on chronic blood loss anemia due to rectal bleed and has undergone colonoscopy on 5/3 with 5 polyps which were removed and sent to pathology.  No signs of active bleeding noted and hemoglobin levels have remained otherwise stable.  No other acute events or concerns noted and he is now in stable condition for discharge per GI.  He will follow-up with GI outpatient in next 3-4 weeks.  Discharge Diagnoses:  Principal Problem:   GI bleed Active Problems:   Essential hypertension   Mixed hyperlipidemia   COPD (chronic obstructive pulmonary disease) (HCC)   Hypokalemia   Hypoalbuminemia due to protein-calorie malnutrition (HCC)   Hematochezia  Principal discharge diagnosis: Acute on chronic blood loss anemia with rectal bleeding status post colonoscopy.  Discharge Instructions  Discharge Instructions     Ambulatory referral to Gastroenterology   Complete by: As directed    What is the reason for referral?: Other   Diet - low sodium heart healthy   Complete by: As directed    Increase activity slowly   Complete by: As directed       Allergies as of 01/15/2024       Reactions   Ibuprofen     Gi bleed         Medication List     TAKE these medications    atorvastatin  80 MG tablet Commonly  known as: LIPITOR Take 1 tablet (80 mg total) by mouth at bedtime.   diphenhydrAMINE  25 MG tablet Commonly known as: BENADRYL  Take 25 mg by mouth at bedtime as needed for sleep.   formoterol  20 MCG/2ML nebulizer solution Commonly known as: PERFOROMIST  Substituted for: Perforomist  Solution Inhale one vial in nebulizer twice a day.   hydrOXYzine  25 MG tablet Commonly known as: ATARAX  Take 25 mg by mouth at bedtime as needed.   ipratropium-albuterol  0.5-2.5 (3) MG/3ML Soln Commonly known as: DUONEB Take 3 mLs by nebulization every 4 (four) hours as needed.   losartan  100 MG tablet Commonly known as: COZAAR  Take 100 mg by mouth daily.   nitroGLYCERIN  0.4 MG SL tablet Commonly known as: NITROSTAT  PLACE 1 TABLET UNDER THE TONGUE EVERY 5 MINUTES AS NEEDED FOR CHEST PAIN. MAX 3 TABLETS PER EVENT. CALL 911   pantoprazole  40 MG tablet Commonly known as: PROTONIX  TAKE 1 TABLET(40 MG) BY MOUTH TWICE DAILY   TYLENOL  8 HOUR PO Take by mouth. Pt is taking 500mg    Yupelri  175 MCG/3ML nebulizer solution Generic drug: revefenacin  Take 3 mLs (175 mcg total) by nebulization daily.        Follow-up Information     Omie Bickers, MD. Schedule an appointment as soon as possible for a visit in 1 week(s).   Specialty: Internal Medicine Contact information: [address] [place] [address] [phone]         [facility] Go in 4 week(s).   Contact information: [address] [place]  [phone]               Allergies  Allergen Reactions   Ibuprofen      Gi bleed     Consultations: GI  Procedures/Studies: CT ANGIO GI BLEED Result Date: 01/12/2024 CLINICAL DATA:  Lower gastrointestinal hemorrhage EXAM: CTA ABDOMEN AND PELVIS WITHOUT AND WITH CONTRAST TECHNIQUE: Multidetector CT imaging of the abdomen and pelvis was performed using the standard protocol during bolus administration of intravenous  contrast. Multiplanar reconstructed images and MIPs were obtained and reviewed to evaluate the vascular anatomy. RADIATION DOSE REDUCTION: This exam was performed according to the departmental dose-optimization program which includes automated exposure control, adjustment of the mA and/or kV according to patient size and/or use of iterative reconstruction technique. CONTRAST:  OMNIPAQUE  IOHEXOL  350 MG/ML SOLN COMPARISON:  None Available. FINDINGS: VASCULAR Aorta: Normal caliber aorta without aneurysm, dissection, vasculitis or significant stenosis. Extensive atherosclerotic calcification Celiac: Patent without evidence of aneurysm, dissection, vasculitis or significant stenosis. Replaced left hepatic artery. 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 Distally widely patent. Renals: Both renal arteries are patent without evidence of aneurysm, dissection, vasculitis, fibromuscular dysplasia or significant stenosis. IMA: Patent without evidence of aneurysm, dissection, vasculitis or significant stenosis. Inflow: Patent without evidence of aneurysm, dissection, vasculitis or significant stenosis. Proximal Outflow: Bilateral common femoral and visualized portions of the superficial and profunda femoral arteries are patent without evidence of aneurysm, dissection, vasculitis or significant stenosis. Veins: Unremarkable Review of the MIP images confirms the above findings. NON-VASCULAR Lower chest: Advanced emphysema no acute abnormality. Hepatobiliary: No focal liver abnormality is seen. Status post cholecystectomy. No biliary dilatation. Pancreas: Unremarkable Spleen: Unremarkable Adrenals/Urinary Tract: Adrenal glands are unremarkable. Simple cortical cysts are seen within the kidneys bilaterally for which no follow-up imaging is recommended. Punctate 2-3 mm nonobstructing calculi are seen within the left kidney. The kidneys are otherwise unremarkable. Bladder  unremarkable. Stomach/Bowel: No active gastrointestinal hemorrhage. Severe distal descending and sigmoid colonic diverticulosis. Stomach, small bowel, and large bowel are otherwise unremarkable. Appendix absent. No evidence of obstruction or focal inflammation. No free intraperitoneal gas or fluid. Lymphatic: 18 mm rounded enhancing masses within the gastrohepatic ligament is indeterminate, possibly representing a gastric attic lymph node or an exophytic nodule arising from the left hepatic lobe, but appears stable since remote prior CT examination of 08/15/2016 and is likely benign given its stability over time. No additional pathologic adenopathy within the abdomen and pelvis. Reproductive: Prostate is unremarkable. Other: No abdominal wall hernia or abnormality. No abdominopelvic ascites. Musculoskeletal: No acute bone abnormality. No lytic or blastic bone lesion. Osseous structures are age appropriate. IMPRESSION: 1. No active gastrointestinal hemorrhage. 2. Extensive atherosclerotic calcification of the abdominal vasculature without evidence of aneurysm or dissection. 3. 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 4. Severe distal colonic diverticulosis without evidence of acute diverticulitis. 5. 2-3 mm nonobstructing left nephrolithiasis. 6. 18 mm rounded enhancing mass within the gastrohepatic ligament, stable since remote prior CT examination of 08/15/2016 and likely benign given its stability over time. This may represent a gastrohepatic lymph node or an exophytic nodule arising from the left hepatic lobe, but appears stable since remote prior CT examination of 08/15/2016 and is likely benign given its stability over time. Aortic Atherosclerosis (ICD10-I70.0) and Emphysema (ICD10-J43.9). Electronically Signed   By: Worthy Heads M.D.   On: 01/12/2024  22:16     Discharge Exam: Vitals:   01/15/24 0345 01/15/24 0846  BP: (!) 153/67   Pulse: 69   Resp: 19    Temp: 97.7 F (36.5 C)   SpO2: 92% 90%   Vitals:   01/14/24 1955 01/14/24 2038 01/15/24 0345 01/15/24 0846  BP:  (!) 173/75 (!) 153/67   Pulse:  65 69   Resp:  20 19   Temp:  98 F (36.7 C) 97.7 F (36.5 C)   TempSrc:  Oral Oral   SpO2: 95% 94% 92% 90%  Weight:      Height:        General: Pt is alert, awake, not in acute distress Cardiovascular: RRR, S1/S2 +, no rubs, no gallops Respiratory: CTA bilaterally, no wheezing, no rhonchi Abdominal: Soft, NT, ND, bowel sounds + Extremities: no edema, no cyanosis    The results of significant diagnostics from this hospitalization (including imaging, microbiology, ancillary and laboratory) are listed below for reference.     Microbiology: No results found for this or any previous visit (from the past 240 hours).   Labs: BNP (last 3 results) Recent Labs    03/17/23 1038 12/07/23 0939  BNP 21.0 35.3   Basic Metabolic Panel: Recent Labs  Lab 01/12/24 1604 01/13/24 0631 01/14/24 0443 01/15/24 0717  NA 141 142 139 142  K 3.2* 3.2* 3.6 3.6  CL 107 108 107 108  CO2 27 27 27 28   GLUCOSE 124* 81 94 86  BUN 11 13 12  7*  CREATININE 1.21 1.02 1.03 1.12  CALCIUM  8.4* 7.9* 8.2* 8.0*  MG  --  1.9  --   --   PHOS  --  3.2  --   --    Liver Function Tests: Recent Labs  Lab 01/12/24 1604 01/13/24 0631  AST 14* 13*  ALT 9 10  ALKPHOS 84 74  BILITOT 1.0 1.3*  PROT 5.7* 5.2*  ALBUMIN 3.2* 3.0*   No results for input(s): "LIPASE", "AMYLASE" in the last 168 hours. No results for input(s): "AMMONIA" in the last 168 hours. CBC: Recent Labs  Lab 01/12/24 1604 01/13/24 0631 01/13/24 1920 01/14/24 0443 01/15/24 0717  WBC 4.3 3.9*  --  4.4 3.8*  HGB 11.2* 10.2* 10.3* 9.5* 9.9*  HCT 36.1* 32.3* 33.1* 30.1* 31.0*  MCV 83.6 83.5  --  83.8 84.0  PLT 170 155  --  159 156   Cardiac Enzymes: No results for input(s): "CKTOTAL", "CKMB", "CKMBINDEX", "TROPONINI" in the last 168 hours. BNP: Invalid input(s):  "POCBNP" CBG: No results for input(s): "GLUCAP" in the last 168 hours. D-Dimer No results for input(s): "DDIMER" in the last 72 hours. Hgb A1c No results for input(s): "HGBA1C" in the last 72 hours. Lipid Profile No results for input(s): "CHOL", "HDL", "LDLCALC", "TRIG", "CHOLHDL", "LDLDIRECT" in the last 72 hours. Thyroid  function studies No results for input(s): "TSH", "T4TOTAL", "T3FREE", "THYROIDAB" in the last 72 hours.  Invalid input(s): "FREET3" Anemia work up No results for input(s): "VITAMINB12", "FOLATE", "FERRITIN", "TIBC", "IRON", "RETICCTPCT" in the last 72 hours. Urinalysis    Component Value Date/Time   COLORURINE YELLOW 09/08/2022 0045   APPEARANCEUR CLEAR 09/08/2022 0045   LABSPEC 1.018 09/08/2022 0045   PHURINE 6.0 09/08/2022 0045   GLUCOSEU NEGATIVE 09/08/2022 0045   HGBUR NEGATIVE 09/08/2022 0045   BILIRUBINUR NEGATIVE 09/08/2022 0045   KETONESUR NEGATIVE 09/08/2022 0045   PROTEINUR NEGATIVE 09/08/2022 0045   UROBILINOGEN 0.2 10/22/2013 0820   NITRITE NEGATIVE 09/08/2022 0045   LEUKOCYTESUR  NEGATIVE 09/08/2022 0045   Sepsis Labs Recent Labs  Lab 01/12/24 1604 01/13/24 0631 01/14/24 0443 01/15/24 0717  WBC 4.3 3.9* 4.4 3.8*   Microbiology No results found for this or any previous visit (from the past 240 hours).   Time coordinating discharge: 35 minutes  SIGNED:   Cornelius Dill, DO [facility] 01/15/2024, 3:08 PM  If 7PM-7AM, please contact night-coverage [email or web address]

## 2024-01-15 NOTE — Op Note (Signed)
 [facility] Patient Name: Jeremy [name] Procedure Date: 01/15/2024 10:20 AM MRN: [id number] Date of Birth: [birth year] Attending MD: Samantha Cress , , [id number] CSN: [id number] Age: 88 Admit Type: Inpatient Procedure:                Colonoscopy Indications:              Rectal bleeding Providers:                Samantha Cress, Pasco Bond, RN,  Butter, Technician Referring MD:              Medicines:                Monitored Anesthesia Care Complications:            No immediate complications. Estimated Blood Loss:     Estimated blood loss: none. Procedure:                Pre-Anesthesia Assessment:                           - Prior to the procedure, a History and Physical                            was performed, and patient medications, allergies                            and sensitivities were reviewed. The patient's                            tolerance of previous anesthesia was reviewed.                           - The risks and benefits of the procedure and the                            sedation options and risks were discussed with the                            patient. All questions were answered and informed                            consent was obtained.                           - ASA Grade Assessment: II - A patient with mild                            systemic disease.                           After obtaining informed consent, the colonoscope                            was passed under direct vision. Throughout the  procedure, the patient's blood pressure, pulse, and                            oxygen  saturations were monitored continuously. The                            [id number]-[id number] ([id number]) scope was introduced through                            the anus and advanced to the the cecum, identified                            by appendiceal orifice and ileocecal valve. The                             colonoscopy was performed without difficulty. The                            patient tolerated the procedure well. The quality                            of the bowel preparation was fair. Scope In: 2:25:38 PM Scope Out: 2:53:05 PM Scope Withdrawal Time: 0 hours 12 minutes 23 seconds  Total Procedure Duration: 0 hours 27 minutes 27 seconds  Findings:      The perianal and digital rectal examinations were normal.      Five sessile polyps were found in the transverse colon, ascending colon       and cecum. The polyps were 1 to 2 mm in size. These polyps were removed       with a cold snare. Resection and retrieval were complete.      Multiple large-mouthed and small-mouthed diverticula were found in the       sigmoid colon and descending colon.      The retroflexed view of the distal rectum and anal verge was normal and       showed no anal or rectal abnormalities.      Note: bleeding from diverticular disease Impression:               - Preparation of the colon was fair.                           - Five 1 to 2 mm polyps in the transverse colon, in                            the ascending colon and in the cecum, removed with                            a cold snare. Resected and retrieved.                           - Diverticulosis in the sigmoid colon and in the                            descending colon.                           -  The distal rectum and anal verge are normal on                            retroflexion view. Moderate Sedation:      Per Anesthesia Care Recommendation:           - Return patient to hospital ward for ongoing care.                            Can be discharged home today.                           - Resume previous diet.                           - Await pathology results.                           - Repeat colonoscopy is not recommended due to                            current age (46 years or older) for screening                             purposes. Procedure Code(s):        --- Professional ---                           [phone], Colonoscopy, flexible; with removal of                            tumor(s), polyp(s), or other lesion(s) by snare                            technique Diagnosis Code(s):        --- Professional ---                           D12.3, Benign neoplasm of transverse colon (hepatic                            flexure or splenic flexure)                           D12.2, Benign neoplasm of ascending colon                           D12.0, Benign neoplasm of cecum                           K62.5, Hemorrhage of anus and rectum                           K57.30, Diverticulosis of large intestine without                            perforation or abscess without bleeding CPT  copyright 2022 American Medical Association. All rights reserved. The codes documented in this report are preliminary and upon coder review may  be revised to meet current compliance requirements. Samantha Cress, MD Samantha Cress,  01/15/2024 2:58:53 PM This report has been signed electronically. Number of Addenda: 0

## 2024-01-15 NOTE — Plan of Care (Signed)
  Problem: Education: Goal: Knowledge of General Education information will improve Description: Including pain rating scale, medication(s)/side effects and non-pharmacologic comfort measures 01/15/2024 1554 by Goldie Later, RN Outcome: Adequate for Discharge 01/15/2024 1014 by Goldie Later, RN Outcome: Progressing   Problem: Health Behavior/Discharge Planning: Goal: Ability to manage health-related needs will improve 01/15/2024 1554 by Goldie Later, RN Outcome: Adequate for Discharge 01/15/2024 1014 by Goldie Later, RN Outcome: Progressing   Problem: Clinical Measurements: Goal: Ability to maintain clinical measurements within normal limits will improve 01/15/2024 1554 by Goldie Later, RN Outcome: Adequate for Discharge 01/15/2024 1014 by Goldie Later, RN Outcome: Progressing Goal: Will remain free from infection 01/15/2024 1554 by Goldie Later, RN Outcome: Adequate for Discharge 01/15/2024 1014 by Goldie Later, RN Outcome: Progressing Goal: Diagnostic test results will improve 01/15/2024 1554 by Goldie Later, RN Outcome: Adequate for Discharge 01/15/2024 1014 by Goldie Later, RN Outcome: Progressing Goal: Respiratory complications will improve 01/15/2024 1554 by Goldie Later, RN Outcome: Adequate for Discharge 01/15/2024 1014 by Goldie Later, RN Outcome: Progressing Goal: Cardiovascular complication will be avoided 01/15/2024 1554 by Goldie Later, RN Outcome: Adequate for Discharge 01/15/2024 1014 by Goldie Later, RN Outcome: Progressing   Problem: Activity: Goal: Risk for activity intolerance will decrease 01/15/2024 1554 by Goldie Later, RN Outcome: Adequate for Discharge 01/15/2024 1014 by Goldie Later, RN Outcome: Progressing   Problem: Nutrition: Goal: Adequate nutrition will be maintained 01/15/2024 1554 by Goldie Later, RN Outcome: Adequate for Discharge 01/15/2024 1014 by Goldie Later, RN Outcome: Progressing   Problem: Coping: Goal: Level of anxiety will  decrease 01/15/2024 1554 by Goldie Later, RN Outcome: Adequate for Discharge 01/15/2024 1014 by Goldie Later, RN Outcome: Progressing   Problem: Elimination: Goal: Will not experience complications related to bowel motility 01/15/2024 1554 by Goldie Later, RN Outcome: Adequate for Discharge 01/15/2024 1014 by Goldie Later, RN Outcome: Progressing Goal: Will not experience complications related to urinary retention 01/15/2024 1554 by Goldie Later, RN Outcome: Adequate for Discharge 01/15/2024 1014 by Goldie Later, RN Outcome: Progressing   Problem: Pain Managment: Goal: General experience of comfort will improve and/or be controlled 01/15/2024 1554 by Goldie Later, RN Outcome: Adequate for Discharge 01/15/2024 1014 by Goldie Later, RN Outcome: Progressing   Problem: Safety: Goal: Ability to remain free from injury will improve 01/15/2024 1554 by Goldie Later, RN Outcome: Adequate for Discharge 01/15/2024 1014 by Goldie Later, RN Outcome: Progressing   Problem: Skin Integrity: Goal: Risk for impaired skin integrity will decrease 01/15/2024 1554 by Goldie Later, RN Outcome: Adequate for Discharge 01/15/2024 1014 by Goldie Later, RN Outcome: Progressing

## 2024-01-15 NOTE — Transfer of Care (Signed)
 Immediate Anesthesia Transfer of Care Note  Patient: Jeremy [name]  Procedure(s) Performed: COLONOSCOPY  Patient Location: PACU  Anesthesia Type:General  Level of Consciousness: awake  Airway & Oxygen  Therapy: Patient connected to nasal cannula oxygen   Post-op Assessment: Post -op Vital signs reviewed and stable  Post vital signs: Reviewed  Last Vitals:  Vitals Value Taken Time  BP 154/80 01/15/24 1500  Temp    Pulse 78 01/15/24 1500  Resp 15 01/15/24 1500  SpO2 96 % 01/15/24 1500  Vitals shown include unfiled device data.  Last Pain:  Vitals:   01/15/24 0745  TempSrc:   PainSc: 0-No pain         Complications: There were no known notable events for this encounter.

## 2024-01-15 NOTE — Anesthesia Preprocedure Evaluation (Addendum)
 Anesthesia Evaluation  Patient identified by MRN, date of birth, ID band Patient awake    Reviewed: Allergy & Precautions, H&P , NPO status , Patient's Chart, lab work & pertinent test results, reviewed documented beta blocker date and time   Airway Mallampati: II  TM Distance: >3 FB Neck ROM: full    Dental  (+) Upper Dentures, Edentulous Lower   Pulmonary shortness of breath, sleep apnea , COPD, former smoker   Pulmonary exam normal breath sounds clear to auscultation       Cardiovascular Exercise Tolerance: Good hypertension, + CAD, + Past MI and + DOE  Normal cardiovascular exam Rhythm:regular Rate:Normal  Echo looks normal ? diastolic   Neuro/Psych negative neurological ROS  negative psych ROS   GI/Hepatic negative GI ROS, Neg liver ROS,,,  Endo/Other  negative endocrine ROS    Renal/GU Renal disease  negative genitourinary   Musculoskeletal   Abdominal   Peds  Hematology  (+) Blood dyscrasia, anemia Hgb 9.9   Anesthesia Other Findings   Reproductive/Obstetrics negative OB ROS                             Anesthesia Physical Anesthesia Plan  ASA: 4  Anesthesia Plan: General   Post-op Pain Management: Minimal or no pain anticipated   Induction: Intravenous  PONV Risk Score and Plan: Propofol  infusion  Airway Management Planned: Nasal Cannula and Natural Airway  Additional Equipment: None  Intra-op Plan:   Post-operative Plan:   Informed Consent: I have reviewed the patients History and Physical, chart, labs and discussed the procedure including the risks, benefits and alternatives for the proposed anesthesia with the patient or authorized representative who has indicated his/her understanding and acceptance.     Dental Advisory Given  Plan Discussed with: CRNA  Anesthesia Plan Comments:        Anesthesia Quick Evaluation

## 2024-01-16 ENCOUNTER — Other Ambulatory Visit: Payer: Self-pay | Admitting: Internal Medicine

## 2024-01-16 DIAGNOSIS — J449 Chronic obstructive pulmonary disease, unspecified: Secondary | ICD-10-CM

## 2024-01-17 ENCOUNTER — Encounter (HOSPITAL_COMMUNITY): Payer: Self-pay | Admitting: Gastroenterology

## 2024-01-17 NOTE — Transitions of Care (Post Inpatient/ED Visit) (Signed)
 01/17/2024  Patient ID: Jeremy [name], male   DOB: 06-20-1936, 88 y.o.   MRN: [id number]  Medication Review for transitions of care.  See [employer] for documentation.  Sekou Zuckerman J. Akyla Vavrek RN, MSN [facility], [facility] RN Care Manager Direct Dial: [phone]  Fax: [phone] Website: [email or web address]

## 2024-01-18 ENCOUNTER — Encounter (INDEPENDENT_AMBULATORY_CARE_PROVIDER_SITE_OTHER): Payer: Self-pay | Admitting: *Deleted

## 2024-01-18 ENCOUNTER — Ambulatory Visit: Admitting: Internal Medicine

## 2024-01-18 ENCOUNTER — Telehealth: Payer: Self-pay

## 2024-01-18 ENCOUNTER — Encounter: Payer: Self-pay | Admitting: Internal Medicine

## 2024-01-18 ENCOUNTER — Other Ambulatory Visit (HOSPITAL_COMMUNITY): Payer: Self-pay

## 2024-01-18 VITALS — BP 110/64 | HR 87 | Ht 72.0 in | Wt 182.2 lb

## 2024-01-18 DIAGNOSIS — J449 Chronic obstructive pulmonary disease, unspecified: Secondary | ICD-10-CM

## 2024-01-18 DIAGNOSIS — Z87891 Personal history of nicotine dependence: Secondary | ICD-10-CM

## 2024-01-18 DIAGNOSIS — R0609 Other forms of dyspnea: Secondary | ICD-10-CM

## 2024-01-18 LAB — SURGICAL PATHOLOGY

## 2024-01-18 MED ORDER — YUPELRI 175 MCG/3ML IN SOLN: 175.0000 ug | Freq: Every day | RESPIRATORY_TRACT | 11 refills | Status: DC

## 2024-01-18 NOTE — Progress Notes (Signed)
 Jeremy [name], male    DOB: 1936-07-29,    MRN: [id number]   Brief patient profile:  27 yowm quit smoking 1990/MM   p MI  referred to pulmonary clinic in [place]  10/13/2021 by Dr Del Favia for copd eval with Nov 2022 > ER with dx aecopd with GOLD 2 criteria in 2017 but not requiring any maint rx up until Nov 2022     History of Present Illness  10/13/2021  Pulmonary/ 1st office eval/ Jaydon Avina / [name] / on ACEi/breztri   Chief Complaint  Patient presents with   Consult    Patient had covid in 2019. Patient states that he does feel better now. States that he really needed to be seen a month ago but is better now. States that he is able to walk longer distance now than before.   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 Much worse in am's historically but better now  Cough: none but still urge to clear his throat Sleep: bed blocks/ one pillow SABA use: much less now breztri  one bid / maybe one albuterol  hfa/ neb one each am  Rec Ok to continue lisinopril  but it may be part of the problem with your throat (clearing, choking, gagging, something  stuck, problem swallowing)   Plan A = Automatic = Always=    Breztri  Take 1-2 puffs first thing in am and then another 2 puffs about 12 hours later.   Work on inhaler technique:   Plan B = Backup (to supplement plan A, not to replace it)Only use your albuterol  inhaler as a rescue medication to be used if you can't catch your breath by resting or doing a relaxed purse lip pattern.  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 Cough: min rattling daytime/ no purulent sputum  Sleeping: better at hs on bed blocks  SABA use: alb q am (was supposed to be prn  02: not using  Rec Plan A = Automatic = Always=    performist/yupelri  each am and performist 12 hours later  Plan B = Backup (to supplement plan A, not to replace it) Only use your albuterol  nebulizer as a rescue medication Plan C = Crisis (instead of Plan B but only if Plan B stops working) Prednisone  10 mg take  4 each am x 2 days,   2 each am x 2 days,  1 each am x 2 days and stop  Ok to use Mucinex  dm 1200 mg every 12 hours as needed for cough  Ok to use 02 as needed but goal is to keep the oxygen  meter over 90%   Please keep  follow up office  but call sooner if needed  - bring  all medications/ solutions    06/08/2023  f/u ov/[place] office/Lucielle Vokes re: GOLD 2 copd  maint on yupelri  / bud  did  bring meds / still on xarelto for dvt/PE  rx per Dr Quentin Brunner office  Chief Complaint  Patient presents with   Follow-up    3 month follow up   Dyspnea:  limited more by leg weakness  than breathing  Cough: none  Sleeping: bed blocks and 1 pillow s resp cc  SABA use: none  02: not using   Rec For any flare of cough wheeze short of breath add budesonide  0.25 mg twice daily until better for a whole week     12/07/2023  f/u ov/[place] office/Katelyn Kohlmeyer re: GOLD 2 COPD  maint on yupelri /performist  rarely using budesonide   Chief Complaint  Patient presents with   Follow-up    Hasn't notice any change in breathing since last visit, having some coughing with mucus , no longer taking aspirin  per cardiology because blood in stool   Dyspnea:  no change 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 "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" (not reproduced in office, see walking study)  Cough: none  Sleeping: bed blocks no resp cc  SABA use: not using  02: has it not using  Rec Please remember to go to the  x-ray department  :  cxr copd only  Please schedule  a follow up office visit in 6 weeks, call sooner if needed with all medications /inhalers/ solutions in hand  12[birth year]   Alpha one AT [facility]  / lelvel 155  Eos 0.2   01/18/2024  f/u ov/[place] office/Ankita Newcomer re: GOLD 2 copd  maint on yupelri  did  bring meds  Chief Complaint  Patient presents with   Shortness of Breath   Dyspnea:  improved on yupelri  and formoterol   Cough: none  Sleeping: bed blockes/ one pillow   resp cc  SABA use: none  02: prn     No obvious day to day or daytime variability or assoc excess/ purulent sputum or mucus plugs or hemoptysis or cp or chest tightness, subjective wheeze or overt sinus or hb symptoms.    Also denies any obvious fluctuation of symptoms with weather or environmental changes or other aggravating or alleviating factors except as outlined above   No unusual exposure hx or h/o childhood pna/ asthma or knowledge of premature birth.  Current Allergies, Complete Past Medical History, Past Surgical History, Family History, and Social History were reviewed in [employer] record.  ROS  The following are not active complaints unless bolded Hoarseness, sore throat, dysphagia, dental problems, itching, sneezing,  nasal congestion or discharge of excess mucus or purulent secretions, ear ache,   fever, chills, sweats, unintended wt loss or wt gain, classically pleuritic or exertional cp,  orthopnea pnd or arm/hand swelling  or leg swelling, presyncope, palpitations, abdominal pain, anorexia, nausea, vomiting, diarrhea  or change in bowel habits or change in bladder habits, change in stools or change in urine, dysuria, hematuria,  rash, arthralgias, visual complaints, headache, numbness, weakness or ataxia or problems with walking or coordination,  change in mood or  memory.        Current Meds  Medication Sig   Acetaminophen  (TYLENOL  8 HOUR PO) Take by mouth. Pt is taking 500mg    atorvastatin  (LIPITOR) 80 MG tablet Take 1 tablet (80 mg total)  by mouth at bedtime.   diphenhydrAMINE  (BENADRYL ) 25 MG tablet Take 25 mg by mouth at bedtime as needed for sleep.   formoterol  (PERFOROMIST ) 20 MCG/2ML nebulizer solution Substituted for: Perforomist  Solution Inhale one vial in nebulizer twice a day.   hydrOXYzine  (ATARAX ) 25 MG tablet Take 25 mg by mouth at bedtime  as needed.   losartan  (COZAAR ) 100 MG tablet Take 100 mg by mouth daily.   nitroGLYCERIN  (NITROSTAT ) 0.4 MG SL tablet PLACE 1 TABLET UNDER THE TONGUE EVERY 5 MINUTES AS NEEDED FOR CHEST PAIN. MAX 3 TABLETS PER EVENT. CALL 911   pantoprazole  (PROTONIX ) 40 MG tablet TAKE 1 TABLET(40 MG) BY MOUTH TWICE DAILY   polyethylene glycol powder (GLYCOLAX/MIRALAX) 17 GM/SCOOP powder Take by mouth once.   Psyllium (DAILY FIBER PO) Take by mouth.   revefenacin  (YUPELRI ) 175 MCG/3ML nebulizer solution Take 3 mLs (175 mcg total) by nebulization daily.           Past Medical History:  Diagnosis Date   Abnormal CXR 4/20011   Chest  CT mode/serere COPD ( no mediastinal abnormality)   CAD (coronary artery disease) 2005   anterior MI with stent to the LAD in 1991 / stent 1996 / nuclear 2005, no ischemia   Carotid bruit    Doppler, June, 2013, 0-39% bilateral   COPD (chronic obstructive pulmonary disease) (HCC)    Diverticula of colon    Ejection fraction    55-60%, echo, April, 2011, could not estimate RV pressure   Esophagitis    HOH (hard of hearing)    Hyperlipidemia    Hypertension    Myocardial infarction (HCC)    x2   Shortness of breath    with exertion   Sleep apnea    Stop Bang score of 5   Tobacco abuse         Objective:    Wts  01/18/2024         182  12/07/2023       186  06/08/2023       180  04/08/2023       186  02/17/2023         187 01/18/2023         190  11/03/2022       190   08/05/2022     193 02/11/2022       187   12/05/21 185 lb 9.6 oz (84.2 kg)  11/22/21 187 lb (84.8 kg)  10/13/21 187 lb (84.8 kg)    Vital signs reviewed  01/18/2024  - Note at rest 02  sats  96% on RA   General appearance:    elderly amb wm, very hard of hearing    HEENT : Oropharynx  clear / edentulous  Nasal turbinates nl    NECK :  without  apparent JVD/ palpable Nodes/TM    LUNGS: no acc muscle use,  Min barrel  contour chest wall with bilateral  slightly decreased bs s audible wheeze and  without cough on insp or exp maneuvers and min  Hyperresonant  to  percussion bilaterally    CV:  RRR  no s3 or murmur or increase in P2, and no edema   ABD:  soft and nontender with pos end  insp Hoover's  in the supine position.  No bruits or organomegaly appreciated   MS:  Nl gait/ ext warm without deformities Or obvious joint restrictions  calf tenderness, cyanosis or clubbing     SKIN: warm and dry without lesions    NEURO:  alert, approp, nl sensorium with  no motor or cerebellar deficits apparent.                 Assessment

## 2024-01-18 NOTE — Telephone Encounter (Signed)
 Per patient he is having difficulty getting Yupelri  and Perforomist  refills from pharmacy. He states he was told it was due to his recent hospitalization and something to do with the hospital pharmacy.   Spoke with Betzi, rep for Yupelri . She states this is likely due to the hospital pharmacy billing out for the medications while he was inpatient. She recommends resubmitting new rx's, demo/insurance, and OV note from today. This should restart patient's medication delivery.   Message routed to pharmacy team for assistance.

## 2024-01-18 NOTE — Assessment & Plan Note (Signed)
 Quit smoking 1990 /MM - Spirometry 11/08/15   FEV1 1.89 (58%)  Ratio 0.49 p 10% improvement from saba  - 10/13/2021  After extensive coaching inhaler device,  effectiveness =    75% (short Ti)  - d/c acei 11/22/21 > all wheeze resolve by 12/05/2021 ov - PFT's  12/03/21  FEV1 1.76 (58 % ) ratio 0.42  p 10 % improvement from saba p 0 prior to study with DLCO  14.04 (55%) corrects to 2.66  (69%)  for alv volume and FV curve classically concave    - 11/03/2022  After extensive coaching inhaler device,  effectiveness =    80% (slt delayed trigger) - 01/18/2023 flared on breztri /  "neb is the only thing that helps"  with assoc throat clearing ? Adverse effects of hfa so rec change to neb formoterol /bud   - 02/17/2023 added yupelri   - 04/08/2023 added prn pred x 6 d and if needing freq next step is add budesonide  to neb  - 04/08/2023   Walked on RA  x  2  lap(s) =  approx 300  ft  @  mod pace, stopped due to sob with lowest 02 sats 89%   - 12/07/2023   Walked on RA  x  lap(s) =  appr ox 150  ft  @ mod pace/ akward gait, stopped due to became "tired" unsteady on feet  with lowest 02 sats 93% and no sob  - 12/06/33   Alpha one AT [facility]  / lelvel 155  Eos 0.2   Pt is Group B in terms of symptom/risk and laba/lama therefore appropriate rx at this point >>>  continue yupelri /brovana  if can acquire it and if not then stiolto best choice

## 2024-01-18 NOTE — Telephone Encounter (Signed)
 Miller, Juliana M, CPhT to Me      01/18/24 12:40 PM Nebulizer solutions must be billed under Medicare Part B.    I have sent new rx to pt's pharm with the dx code J44.9

## 2024-01-18 NOTE — Telephone Encounter (Signed)
 Nebulizer solutions for this patient must be billed through Medicare Part B with the appropriate ICD 10 code stated on the prescriptions sent to the patients pharmacy.

## 2024-01-18 NOTE — Assessment & Plan Note (Addendum)
 Echo 03/17/23  TDS but no abn findings  - 12/07/2023 unsteady on feet but no sob or desats p 150 ft mod pace walking  - 01/18/2024   Walked on RA  x  2.5  lap(s) =  approx 375  ft  @ slow pace, stopped due mild sob/ somewhat unsteady on his feet   with lowest 02 sats 92%    F/u q 3 m sooner prn   Each maintenance medication was reviewed in detail including emphasizing most importantly the difference between maintenance and prns and under what circumstances the prns are to be triggered using an action plan format where appropriate.  Total time for H and P, chart review, counseling, reviewing neb/pulse ox device(s) , directly observing portions of ambulatory 02 saturation study/ and generating customized AVS unique to this office visit / same day charting = 30 min

## 2024-01-18 NOTE — Telephone Encounter (Signed)
*  Pulm  Pharmacy Patient Advocate Encounter   Received notification from Pt Calls Messages that prior authorization for yupelri  neb sol.  is required/requested.   Insurance verification completed.   The patient is insured through [facility] .   Per test claim: Medication is not eligible for pharmacy benefits and must be billed through medical insurance. As our team only handles pharmacy related prior auths, medical PA's must be submitted by the clinic. Thank you

## 2024-01-18 NOTE — Patient Instructions (Signed)
 No change in medications   We will be calling [facility] to sort out the issue of your nebulizer solutions  Please schedule a follow up visit in 3 months but call sooner if needed

## 2024-01-19 MED ORDER — FORMOTEROL FUMARATE 20 MCG/2ML IN NEBU: INHALATION_SOLUTION | RESPIRATORY_TRACT | 11 refills | Status: AC

## 2024-01-19 MED ORDER — YUPELRI 175 MCG/3ML IN SOLN: 175.0000 ug | Freq: Every day | RESPIRATORY_TRACT | 11 refills | Status: DC

## 2024-01-19 NOTE — Telephone Encounter (Signed)
 Yupelri  and Perforomist  sent to [facility] with dx codes.

## 2024-01-26 DIAGNOSIS — Z8719 Personal history of other diseases of the digestive system: Secondary | ICD-10-CM | POA: Diagnosis not present

## 2024-01-26 DIAGNOSIS — K922 Gastrointestinal hemorrhage, unspecified: Secondary | ICD-10-CM | POA: Diagnosis not present

## 2024-02-03 DIAGNOSIS — H43393 Other vitreous opacities, bilateral: Secondary | ICD-10-CM | POA: Diagnosis not present

## 2024-02-12 DIAGNOSIS — E782 Mixed hyperlipidemia: Secondary | ICD-10-CM | POA: Diagnosis not present

## 2024-02-12 DIAGNOSIS — N401 Enlarged prostate with lower urinary tract symptoms: Secondary | ICD-10-CM | POA: Diagnosis not present

## 2024-02-12 DIAGNOSIS — J449 Chronic obstructive pulmonary disease, unspecified: Secondary | ICD-10-CM | POA: Diagnosis not present

## 2024-02-12 DIAGNOSIS — I10 Essential (primary) hypertension: Secondary | ICD-10-CM | POA: Diagnosis not present

## 2024-02-14 NOTE — Progress Notes (Unsigned)
 GI Office Note    Referring Provider: Omie Bickers, MD Primary Care Physician:  Omie Bickers, MD  Primary Gastroenterologist: Dr. Alita Irwin  Chief Complaint   Chief Complaint  Patient presents with   Follow-up    Follow up on gastroenteritis and passing blood. Pt states he is better and has not passed any blood    History of Present Illness   Jeremy [name] is a 88 y.o. male presenting today for follow up. Seen in 12/2023 with bowel issues, abd pain, nausea.   He was admitted last month with rectal bleeindg. Hgb 11.2 (down from 12.6 on 11/2023). CTA GI bleed scan with no active GI hemorrhage noted. SMA origin with >75% stenosis, distally widely patent. IMA and celiac patent. Severe distal colonic diverticulosis. 18 mm rounded enhancing mass within gastrohepatic ligament, stable since 08/2016, likely benign. Similar presentation in 2022 felt to be diverticular bleed at that time.    Today:    No dysphagia, heartburn, n/v. Appetite ok. Weight stable. No abdominal pain. BM every day to every other day. Using metamucil, one tablespoon and miralax one tablespoon daily. Stopped colace. Noted his BP is low today and recently has been more fatigued. Saturday, wife discovered that he had been taking an extra BP at night. Stopped this on Saturday. Did not take his BP before they realized this. Yesterday he bush hogged for 3 hours and mowed grass.    Had labs this morning. Sees PCP June 9th. Per patient, cardiology/pcp want him back on ASA 81mg  daily.   Wt Readings from Last 10 Encounters:  02/15/24 183 lb 6.4 oz (83.2 kg)  01/18/24 182 lb 3.2 oz (82.6 kg)  01/13/24 181 lb 7 oz (82.3 kg)  01/05/24 186 lb (84.4 kg)  12/07/23 186 lb 12.8 oz (84.7 kg)  10/27/23 190 lb 3.2 oz (86.3 kg)  07/05/23 184 lb 3.2 oz (83.6 kg)  06/11/23 180 lb 3.2 oz (81.7 kg)  06/08/23 180 lb 3.2 oz (81.7 kg)  05/31/23 180 lb 6.4 oz (81.8 kg)    Colonoscopy 01/15/24: -preparation of colon fair -five 1-49mm polyps  removed, tuublar adenomas -diverticulosis  -no need for repeat colonoscopy due to age unless develops new symptosm  CT abdomen pelvis 04/30/2023  1. Nonobstructive punctate left nephrolithiasis. 2. Colonic diverticulosis with no acute diverticulitis. 3. Stable infrarenal abdominal aorta. Recommend follow-up ultrasound every 3 years. This recommendation follows ACR consensus guidelines: White Paper of the ACR Incidental Findings Committee II on Vascular Findings. J Am Coll Radiol 2013; 16:109-604. 4.  Aortic Atherosclerosis (ICD10-I70.0). 5. No acute intra-intrapelvic abnormality with limited evaluation on this noncontrast study.   CTA Chest10/2024  IMPRESSION: -Previous pulmonary emboli are actually decreased from previous. There is some minimal web like areas in the right lung base which could be subacute to chronic. No new large or central embolus. Breathing motion. -Advanced emphysematous lung changes. 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 -There is a enlarged solitary node identified along the gastrohepatic ligament of uncertain etiology and significance. Additional evaluation can be considered when clinically appropriate.   Endoscopic history  05/2023  - Benign- appearing esophageal stenosis. Dilated. - 2 cm hiatal hernia. - Erythematous mucosa in the antrum. Biopsied. - Normal duodenal bulb and second portion of the duodenum.   No H. Pylori bacteria in stomach, or any early cancer changes to the stomach mucosa ( Intestinal metaplasia)    04/2021 Moderate distal esophageal stricture dilated with balloon dilated to 18 mm. Small sliding  hiatal hernia. Normal examination stomach first and second part of duodenum.   Colonoscopy completed with ultraslim scope. Numerous diverticula at sigmoid colon with noncritical stricture at 30 cm from the anal margin traversed with ultraslim scope. No polyps or other abnormalities noted. Anal papillae     Medications    Current Outpatient Medications  Medication Sig Dispense Refill   Acetaminophen  (TYLENOL  8 HOUR PO) Take by mouth. Pt is taking 500mg      atorvastatin  (LIPITOR) 80 MG tablet Take 1 tablet (80 mg total) by mouth at bedtime. 90 tablet 2   formoterol  (PERFOROMIST ) 20 MCG/2ML nebulizer solution Substituted for: Perforomist  Solution Inhale one vial in nebulizer twice a day. Dx: J44.9 2 mL 11   hydrOXYzine  (ATARAX ) 25 MG tablet Take 25 mg by mouth at bedtime as needed.     losartan  (COZAAR ) 100 MG tablet Take 100 mg by mouth daily.     nitroGLYCERIN  (NITROSTAT ) 0.4 MG SL tablet PLACE 1 TABLET UNDER THE TONGUE EVERY 5 MINUTES AS NEEDED FOR CHEST PAIN. MAX 3 TABLETS PER EVENT. CALL 911 25 tablet 3   pantoprazole  (PROTONIX ) 40 MG tablet TAKE 1 TABLET(40 MG) BY MOUTH TWICE DAILY 180 tablet 3   polyethylene glycol powder (GLYCOLAX/MIRALAX) 17 GM/SCOOP powder Take by mouth once.     Psyllium (DAILY FIBER PO) Take by mouth.     revefenacin  (YUPELRI ) 175 MCG/3ML nebulizer solution Take 3 mLs (175 mcg total) by nebulization daily. 90 mL 11   diphenhydrAMINE  (BENADRYL ) 25 MG tablet Take 25 mg by mouth at bedtime as needed for sleep. (Patient not taking: Reported on 02/15/2024)     ipratropium-albuterol  (DUONEB) 0.5-2.5 (3) MG/3ML SOLN Take 3 mLs by nebulization every 4 (four) hours as needed. (Patient not taking: Reported on 02/15/2024) 360 mL 0   No current facility-administered medications for this visit.    Allergies   Allergies as of 02/15/2024 - Review Complete 02/15/2024  Allergen Reaction Noted   Ibuprofen   05/31/2023       Review of Systems   General: Negative for anorexia, weight loss, fever, chills, fatigue, weakness. ENT: Negative for hoarseness, difficulty swallowing , nasal congestion. CV: Negative for chest pain, angina, palpitations, dyspnea on exertion, peripheral edema.  Respiratory: Negative for dyspnea at rest, dyspnea on exertion, cough, sputum, wheezing.  GI: See history of  present illness. GU:  Negative for dysuria, hematuria, urinary incontinence, urinary frequency, nocturnal urination.  Endo: Negative for unusual weight change.     Physical Exam   BP (!) 92/56   Pulse 73   Temp 98.7 F (37.1 C)   Ht 6' (1.829 m)   Wt 183 lb 6.4 oz (83.2 kg)   BMI 24.87 kg/m    General: Well-nourished, well-developed in no acute distress.  Eyes: No icterus. Mouth: Oropharyngeal mucosa moist and pink   Lungs: Clear to auscultation bilaterally.  Heart: Regular rate and rhythm, no murmurs rubs or gallops.  Abdomen: Bowel sounds are normal, nontender, nondistended, no hepatosplenomegaly or masses,  no abdominal bruits or hernia , no rebound or guarding.  Rectal: not performed  Extremities: No lower extremity edema. No clubbing or deformities. Neuro: Alert and oriented x 4   Skin: Warm and dry, no jaundice.   Psych: Alert and cooperative, normal mood and affect.  Labs   Lab Results  Component Value Date   NA 142 01/15/2024   CL 108 01/15/2024   K 3.6 01/15/2024   CO2 28 01/15/2024   BUN 7 (L) 01/15/2024   CREATININE 1.12  01/15/2024   GFRNONAA >60 01/15/2024   CALCIUM  8.0 (L) 01/15/2024   PHOS 3.2 01/13/2024   ALBUMIN 3.0 (L) 01/13/2024   GLUCOSE 86 01/15/2024   Lab Results  Component Value Date   ALT 10 01/13/2024   AST 13 (L) 01/13/2024   ALKPHOS 74 01/13/2024   BILITOT 1.3 (H) 01/13/2024   Lab Results  Component Value Date   WBC 3.8 (L) 01/15/2024   HGB 9.9 (L) 01/15/2024   HCT 31.0 (L) 01/15/2024   MCV 84.0 01/15/2024   PLT 156 01/15/2024    No results found for: "VITAMINB12" No results found for: "FOLATE"  Imaging Studies   No results found.  Assessment/Plan:   Rectal bleeding/anemia: -recent admission, suspected diverticular bleed -colonoscopy completed as outlined -continue miralax/metamucil to avoid constipation -follow up today's labs when available -patient states cardiology wants him back on ASA 81mg  daily, he can resume,  monitor for bleeding  Low normal BP: -accidentally doubling his BP medication, just realized two days ago, less symptomatic today but had been fatigued.  -monitor BP at home, if persistently low over next 24-48 hours he will notify PCP.   Trudie Fuse. Harles Lied MHS, PA-C [facility]  Addendum: 02/15/24: Hgb 10.8 (up from 9.9 one month ago).

## 2024-02-15 ENCOUNTER — Ambulatory Visit (INDEPENDENT_AMBULATORY_CARE_PROVIDER_SITE_OTHER): Admitting: Gastroenterology

## 2024-02-15 VITALS — BP 92/56 | HR 73 | Temp 98.7°F | Ht 72.0 in | Wt 183.4 lb

## 2024-02-15 DIAGNOSIS — K625 Hemorrhage of anus and rectum: Secondary | ICD-10-CM | POA: Diagnosis not present

## 2024-02-15 DIAGNOSIS — I10 Essential (primary) hypertension: Secondary | ICD-10-CM | POA: Diagnosis not present

## 2024-02-15 DIAGNOSIS — D649 Anemia, unspecified: Secondary | ICD-10-CM | POA: Diagnosis not present

## 2024-02-15 DIAGNOSIS — Z8719 Personal history of other diseases of the digestive system: Secondary | ICD-10-CM | POA: Diagnosis not present

## 2024-02-15 DIAGNOSIS — N1831 Chronic kidney disease, stage 3a: Secondary | ICD-10-CM | POA: Diagnosis not present

## 2024-02-15 DIAGNOSIS — E539 Vitamin B deficiency, unspecified: Secondary | ICD-10-CM | POA: Diagnosis not present

## 2024-02-15 NOTE — Patient Instructions (Signed)
 Monitor for any signs of bleeding once restarting baby aspirin  (81mg ).   Continue miralax and metamucil for constipation. If you find stools are too hard, you can increase miralax to whole capful daily.  I will follow up on your lab results when available.   Check your blood pressure could of different times during the day over the next couple of days. Your pressure should improve now that you have reduced your medication.   Call with any questions or concerns.

## 2024-02-18 ENCOUNTER — Telehealth: Payer: Self-pay | Admitting: Gastroenterology

## 2024-02-18 NOTE — Telephone Encounter (Signed)
 Please let pt know I saw his pcp labs. Hgb up to 10.8. would recommend rechecking in 8 weeks. Please arrange.   Patient should follow up with Dr. Alita Irwin in 3 months.

## 2024-02-21 ENCOUNTER — Other Ambulatory Visit: Payer: Self-pay

## 2024-02-21 DIAGNOSIS — E782 Mixed hyperlipidemia: Secondary | ICD-10-CM | POA: Diagnosis not present

## 2024-02-21 DIAGNOSIS — Z8719 Personal history of other diseases of the digestive system: Secondary | ICD-10-CM | POA: Diagnosis not present

## 2024-02-21 DIAGNOSIS — M25561 Pain in right knee: Secondary | ICD-10-CM | POA: Diagnosis not present

## 2024-02-21 DIAGNOSIS — M6281 Muscle weakness (generalized): Secondary | ICD-10-CM | POA: Diagnosis not present

## 2024-02-21 DIAGNOSIS — J309 Allergic rhinitis, unspecified: Secondary | ICD-10-CM | POA: Diagnosis not present

## 2024-02-21 DIAGNOSIS — R630 Anorexia: Secondary | ICD-10-CM | POA: Diagnosis not present

## 2024-02-21 DIAGNOSIS — I129 Hypertensive chronic kidney disease with stage 1 through stage 4 chronic kidney disease, or unspecified chronic kidney disease: Secondary | ICD-10-CM | POA: Diagnosis not present

## 2024-02-21 DIAGNOSIS — Z9981 Dependence on supplemental oxygen: Secondary | ICD-10-CM | POA: Diagnosis not present

## 2024-02-21 DIAGNOSIS — I7143 Infrarenal abdominal aortic aneurysm, without rupture: Secondary | ICD-10-CM | POA: Diagnosis not present

## 2024-02-21 DIAGNOSIS — I700 Atherosclerosis of aorta: Secondary | ICD-10-CM | POA: Diagnosis not present

## 2024-02-21 DIAGNOSIS — D649 Anemia, unspecified: Secondary | ICD-10-CM

## 2024-02-21 DIAGNOSIS — J449 Chronic obstructive pulmonary disease, unspecified: Secondary | ICD-10-CM | POA: Diagnosis not present

## 2024-02-21 DIAGNOSIS — N1831 Chronic kidney disease, stage 3a: Secondary | ICD-10-CM | POA: Diagnosis not present

## 2024-02-21 NOTE — Telephone Encounter (Signed)
 Pt was made aware and verbalized understanding. Lab was ordered and will be mailed to the pt to complete in 8 weeks.   Jeremy [name]: please arrange f/u with Dr. Alita Irwin in 3 mths.

## 2024-02-23 DIAGNOSIS — K219 Gastro-esophageal reflux disease without esophagitis: Secondary | ICD-10-CM | POA: Diagnosis not present

## 2024-02-23 DIAGNOSIS — E539 Vitamin B deficiency, unspecified: Secondary | ICD-10-CM | POA: Diagnosis not present

## 2024-02-23 DIAGNOSIS — G4700 Insomnia, unspecified: Secondary | ICD-10-CM | POA: Diagnosis not present

## 2024-02-23 DIAGNOSIS — I2699 Other pulmonary embolism without acute cor pulmonale: Secondary | ICD-10-CM | POA: Diagnosis not present

## 2024-02-23 DIAGNOSIS — I7143 Infrarenal abdominal aortic aneurysm, without rupture: Secondary | ICD-10-CM | POA: Diagnosis not present

## 2024-02-23 DIAGNOSIS — J449 Chronic obstructive pulmonary disease, unspecified: Secondary | ICD-10-CM | POA: Diagnosis not present

## 2024-02-23 DIAGNOSIS — E782 Mixed hyperlipidemia: Secondary | ICD-10-CM | POA: Diagnosis not present

## 2024-02-23 DIAGNOSIS — R630 Anorexia: Secondary | ICD-10-CM | POA: Diagnosis not present

## 2024-02-23 DIAGNOSIS — J309 Allergic rhinitis, unspecified: Secondary | ICD-10-CM | POA: Diagnosis not present

## 2024-02-23 DIAGNOSIS — I10 Essential (primary) hypertension: Secondary | ICD-10-CM | POA: Diagnosis not present

## 2024-02-23 DIAGNOSIS — I2510 Atherosclerotic heart disease of native coronary artery without angina pectoris: Secondary | ICD-10-CM | POA: Diagnosis not present

## 2024-02-23 DIAGNOSIS — N401 Enlarged prostate with lower urinary tract symptoms: Secondary | ICD-10-CM | POA: Diagnosis not present

## 2024-03-13 DIAGNOSIS — J449 Chronic obstructive pulmonary disease, unspecified: Secondary | ICD-10-CM | POA: Diagnosis not present

## 2024-03-13 DIAGNOSIS — I10 Essential (primary) hypertension: Secondary | ICD-10-CM | POA: Diagnosis not present

## 2024-03-13 DIAGNOSIS — E782 Mixed hyperlipidemia: Secondary | ICD-10-CM | POA: Diagnosis not present

## 2024-03-13 DIAGNOSIS — I2510 Atherosclerotic heart disease of native coronary artery without angina pectoris: Secondary | ICD-10-CM | POA: Diagnosis not present

## 2024-03-21 DIAGNOSIS — I2510 Atherosclerotic heart disease of native coronary artery without angina pectoris: Secondary | ICD-10-CM | POA: Diagnosis not present

## 2024-03-21 DIAGNOSIS — I10 Essential (primary) hypertension: Secondary | ICD-10-CM | POA: Diagnosis not present

## 2024-03-21 DIAGNOSIS — R630 Anorexia: Secondary | ICD-10-CM | POA: Diagnosis not present

## 2024-03-21 DIAGNOSIS — I2699 Other pulmonary embolism without acute cor pulmonale: Secondary | ICD-10-CM | POA: Diagnosis not present

## 2024-03-21 DIAGNOSIS — E539 Vitamin B deficiency, unspecified: Secondary | ICD-10-CM | POA: Diagnosis not present

## 2024-03-21 DIAGNOSIS — E782 Mixed hyperlipidemia: Secondary | ICD-10-CM | POA: Diagnosis not present

## 2024-03-21 DIAGNOSIS — K219 Gastro-esophageal reflux disease without esophagitis: Secondary | ICD-10-CM | POA: Diagnosis not present

## 2024-03-21 DIAGNOSIS — J449 Chronic obstructive pulmonary disease, unspecified: Secondary | ICD-10-CM | POA: Diagnosis not present

## 2024-03-21 DIAGNOSIS — I7143 Infrarenal abdominal aortic aneurysm, without rupture: Secondary | ICD-10-CM | POA: Diagnosis not present

## 2024-03-21 DIAGNOSIS — N401 Enlarged prostate with lower urinary tract symptoms: Secondary | ICD-10-CM | POA: Diagnosis not present

## 2024-03-21 DIAGNOSIS — J309 Allergic rhinitis, unspecified: Secondary | ICD-10-CM | POA: Diagnosis not present

## 2024-03-21 DIAGNOSIS — G4700 Insomnia, unspecified: Secondary | ICD-10-CM | POA: Diagnosis not present

## 2024-04-07 ENCOUNTER — Other Ambulatory Visit: Payer: Self-pay | Admitting: Internal Medicine

## 2024-04-07 NOTE — Telephone Encounter (Signed)
 Copied from CRM [id number]. Topic: Clinical - Medication Refill >> Apr 07, 2024  3:03 PM Corean SAUNDERS wrote: Medication: revefenacin  (YUPELRI ) 175 MCG/3ML nebulizer solution  Has the patient contacted their pharmacy? Pharmacy advised patient there are no more refills.  (Agent: If no, request that the patient contact the pharmacy for the refill. If patient does not wish to contact the pharmacy document the reason why and proceed with request.) (Agent: If yes, when and what did the pharmacy advise?)  This is the patient's preferred pharmacy:    [facility] - [place], [place] - [address] [place] [address] Phone: [phone] Fax: [phone]  Is this the correct pharmacy for this prescription? Yes If no, delete pharmacy and type the correct one.   Has the prescription been filled recently? Yes  Is the patient out of the medication? No  Has the patient been seen for an appointment in the last year OR does the patient have an upcoming appointment? Yes  Can we respond through MyChart? No  Agent: Please be advised that Rx refills may take up to 3 business days. We ask that you follow-up with your pharmacy.

## 2024-04-10 MED ORDER — YUPELRI 175 MCG/3ML IN SOLN: 175.0000 ug | Freq: Every day | RESPIRATORY_TRACT | 11 refills | Status: DC

## 2024-04-11 ENCOUNTER — Other Ambulatory Visit: Payer: Self-pay

## 2024-04-11 DIAGNOSIS — I7143 Infrarenal abdominal aortic aneurysm, without rupture: Secondary | ICD-10-CM | POA: Diagnosis not present

## 2024-04-11 DIAGNOSIS — J449 Chronic obstructive pulmonary disease, unspecified: Secondary | ICD-10-CM | POA: Diagnosis not present

## 2024-04-11 DIAGNOSIS — E539 Vitamin B deficiency, unspecified: Secondary | ICD-10-CM | POA: Diagnosis not present

## 2024-04-11 DIAGNOSIS — J309 Allergic rhinitis, unspecified: Secondary | ICD-10-CM | POA: Diagnosis not present

## 2024-04-11 DIAGNOSIS — I10 Essential (primary) hypertension: Secondary | ICD-10-CM | POA: Diagnosis not present

## 2024-04-11 DIAGNOSIS — I2699 Other pulmonary embolism without acute cor pulmonale: Secondary | ICD-10-CM | POA: Diagnosis not present

## 2024-04-11 DIAGNOSIS — G4700 Insomnia, unspecified: Secondary | ICD-10-CM | POA: Diagnosis not present

## 2024-04-11 DIAGNOSIS — N401 Enlarged prostate with lower urinary tract symptoms: Secondary | ICD-10-CM | POA: Diagnosis not present

## 2024-04-11 DIAGNOSIS — D649 Anemia, unspecified: Secondary | ICD-10-CM

## 2024-04-11 DIAGNOSIS — R630 Anorexia: Secondary | ICD-10-CM | POA: Diagnosis not present

## 2024-04-11 DIAGNOSIS — K219 Gastro-esophageal reflux disease without esophagitis: Secondary | ICD-10-CM | POA: Diagnosis not present

## 2024-04-11 DIAGNOSIS — E782 Mixed hyperlipidemia: Secondary | ICD-10-CM | POA: Diagnosis not present

## 2024-04-11 DIAGNOSIS — I2510 Atherosclerotic heart disease of native coronary artery without angina pectoris: Secondary | ICD-10-CM | POA: Diagnosis not present

## 2024-04-13 DIAGNOSIS — N401 Enlarged prostate with lower urinary tract symptoms: Secondary | ICD-10-CM | POA: Diagnosis not present

## 2024-04-13 DIAGNOSIS — K219 Gastro-esophageal reflux disease without esophagitis: Secondary | ICD-10-CM | POA: Diagnosis not present

## 2024-04-13 DIAGNOSIS — I10 Essential (primary) hypertension: Secondary | ICD-10-CM | POA: Diagnosis not present

## 2024-04-13 DIAGNOSIS — E782 Mixed hyperlipidemia: Secondary | ICD-10-CM | POA: Diagnosis not present

## 2024-04-14 ENCOUNTER — Other Ambulatory Visit: Payer: Self-pay

## 2024-04-14 ENCOUNTER — Telehealth: Payer: Self-pay

## 2024-04-14 MED ORDER — YUPELRI 175 MCG/3ML IN SOLN: 175.0000 ug | Freq: Every day | RESPIRATORY_TRACT | 11 refills | Status: AC

## 2024-04-14 NOTE — Telephone Encounter (Signed)
 Copied from CRM [id number]. Topic: Clinical - Prescription Issue >> Apr 14, 2024 10:53 AM Joesph PARAS wrote: Reason for CRM: Patient's wife is requesting this revefenacin  (YUPELRI ) 175 MCG/3ML nebulizer solution be sent to the [facility] mail order pharmacy, as [facility] cannot fill this medication.  Sent medication to the correct pharmacy.

## 2024-04-16 NOTE — Progress Notes (Deleted)
 Jeremy [name], male    DOB: [birth year],    MRN: [id number]   Brief patient profile:  43 yowm quit smoking 1990/MM   p MI  referred to pulmonary clinic in [place]  10/13/2021 by Dr Shona for copd eval with Nov 2022 > ER with dx aecopd with GOLD 2 criteria in 2017 but not requiring any maint rx up until Nov 2022     History of Present Illness  10/13/2021  Pulmonary/ 1st office eval/ Aleira Deiter / [place] [facility] / on ACEi/breztri   Chief Complaint  Patient presents with   Consult    Patient had covid in 2019. Patient states that he does feel better now. States that he really needed to be seen a month ago but is better now. States that he is able to walk longer distance now than before.   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 Much worse in am's historically but better now  Cough: none but still urge to clear his throat Sleep: bed blocks/ one pillow SABA use: much less now breztri  one bid / maybe one albuterol  hfa/ neb one each am  Rec Ok to continue lisinopril  but it may be part of the problem with your throat (clearing, choking, gagging, something  stuck, problem swallowing)   Plan A = Automatic = Always=    Breztri  Take 1-2 puffs first thing in am and then another 2 puffs about 12 hours later.   Work on inhaler technique:   Plan B = Backup (to supplement plan A, not to replace it)Only use your albuterol  inhaler as a rescue medication to be used if you can't catch your breath by resting or doing a relaxed purse lip pattern.  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 Cough: min rattling daytime/ no purulent sputum  Sleeping: better at hs on bed blocks  SABA use: alb q am (was supposed to be prn  02: not using  Rec Plan A = Automatic = Always=    performist/yupelri  each am and performist 12 hours later  Plan B = Backup (to supplement plan A, not to replace it) Only use your albuterol  nebulizer as a rescue medication Plan C = Crisis (instead of Plan B but only if Plan B stops working) Prednisone  10 mg take  4 each am x 2 days,   2 each am x 2 days,  1 each am x 2 days and stop  Ok to use Mucinex  dm 1200 mg every 12 hours as needed for cough  Ok to use 02 as needed but goal is to keep the oxygen  meter over 90%   Please keep  follow up office  but call sooner if needed  - bring  all medications/ solutions    06/08/2023  f/u ov/[place] office/Delani Kohli re: GOLD 2 copd  maint on yupelri  / bud  did  bring meds / still on xarelto for dvt/PE  rx per Dr [place] office  Chief Complaint  Patient presents with   Follow-up    3 month follow up   Dyspnea:  limited more by leg weakness  than breathing  Cough: none  Sleeping: bed blocks and 1 pillow s resp cc  SABA use: none  02: not using   Rec For any flare of cough wheeze short of breath add budesonide  0.25 mg twice daily until better for a whole week     12/07/2023  f/u ov/[place] office/Yoshi Vicencio re: GOLD 2 COPD  maint on yupelri /performist  rarely using budesonide   Chief Complaint  Patient presents with   Follow-up    Hasn't notice any change in breathing since last visit, having some coughing with mucus , no longer taking aspirin  per cardiology because blood in stool   Dyspnea:  no change 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 (not reproduced in office, see walking study)  Cough: none  Sleeping: bed blocks no resp cc  SABA use: not using  02: has it not using  Rec Please remember to go to the  x-ray department  :  cxr copd only  Please schedule  a follow up office visit in 6 weeks, call sooner if needed with all medications /inhalers/ solutions in hand  [birth year]   [facility] AT [facility]  / lelvel 155  Eos 0.2   01/18/2024  f/u ov/[place] [facility]/Amonte Brookover re: GOLD 2 copd  maint on yupelri  did  bring meds  Chief Complaint  Patient presents with   Shortness of Breath   Dyspnea:  improved on yupelri  and formoterol   Cough: none  Sleeping: bed blockes/ one pillow   resp cc  SABA use: none  02: prn  Rec     04/19/2024  f/u ov/Yarielis Funaro re: ***   maint on ***  No chief complaint on file.   Dyspnea:  *** Cough: *** Sleeping: *** resp cc  SABA use: *** 02: ***  Lung cancer screening :  ***    No obvious day to day or daytime variability or assoc excess/ purulent sputum or mucus plugs or hemoptysis or cp or chest tightness, subjective wheeze or overt sinus or hb symptoms.    Also denies any obvious fluctuation of symptoms with weather or environmental changes or other aggravating or alleviating factors except as outlined above   No unusual exposure hx or h/o childhood pna/ asthma or knowledge of premature birth.  Current Allergies, Complete Past Medical History, Past Surgical History, Family History, and Social History were reviewed in [employer] record.  ROS  The following are not active complaints unless bolded Hoarseness, sore throat, dysphagia, dental problems, itching, sneezing,  nasal congestion or discharge of excess mucus or purulent secretions, ear ache,   fever, chills, sweats, unintended wt loss or wt gain, classically pleuritic or exertional cp,  orthopnea pnd or arm/hand swelling  or leg swelling, presyncope, palpitations, abdominal pain, anorexia, nausea, vomiting, diarrhea  or change in bowel habits or change in bladder habits, change in stools or change in urine, dysuria, hematuria,  rash, arthralgias, visual complaints, headache, numbness, weakness or ataxia or problems with walking or coordination,  change  in mood or  memory.        No outpatient medications have been marked as taking for the 04/19/24 encounter (Appointment) with Darlean Ozell NOVAK, MD.             Past Medical History:  Diagnosis Date   Abnormal CXR 4/20011  Chest  CT mode/serere COPD ( no mediastinal abnormality)   CAD (coronary artery disease) 2005   anterior MI with stent to the LAD in 1991 / stent 1996 / nuclear 2005, no ischemia   Carotid bruit    Doppler, June, 2013, 0-39% bilateral   COPD (chronic obstructive pulmonary disease) (HCC)    Diverticula of colon    Ejection fraction    55-60%, echo, April, 2011, could not estimate RV pressure   Esophagitis    HOH (hard of hearing)    Hyperlipidemia    Hypertension    Myocardial infarction (HCC)    x2   Shortness of breath    with exertion   Sleep apnea    Stop Bang score of 5   Tobacco abuse         Objective:    Wts  04/19/2024          ***  01/18/2024         182  12/07/2023       186  06/08/2023       180  04/08/2023       186  02/17/2023         187 01/18/2023         190  11/03/2022       190   08/05/2022     193 02/11/2022       187   12/05/21 185 lb 9.6 oz (84.2 kg)  11/22/21 187 lb (84.8 kg)  10/13/21 187 lb (84.8 kg)    Vital signs reviewed  04/19/2024  - Note at rest 02 sats  ***% on ***   General appearance:    ***   edentulous  Min barr****               Assessment

## 2024-04-17 DIAGNOSIS — D649 Anemia, unspecified: Secondary | ICD-10-CM | POA: Diagnosis not present

## 2024-04-18 LAB — CBC WITH DIFFERENTIAL/PLATELET
Basophils Absolute: 0 x10E3/uL (ref 0.0–0.2)
Basos: 0 %
EOS (ABSOLUTE): 0.2 x10E3/uL (ref 0.0–0.4)
Eos: 5 %
Hematocrit: 36.2 % — ABNORMAL LOW (ref 37.5–51.0)
Hemoglobin: 10.8 g/dL — ABNORMAL LOW (ref 13.0–17.7)
Immature Grans (Abs): 0 x10E3/uL (ref 0.0–0.1)
Immature Granulocytes: 0 %
Lymphocytes Absolute: 1.2 x10E3/uL (ref 0.7–3.1)
Lymphs: 27 %
MCH: 24.2 pg — ABNORMAL LOW (ref 26.6–33.0)
MCHC: 29.8 g/dL — ABNORMAL LOW (ref 31.5–35.7)
MCV: 81 fL (ref 79–97)
Monocytes Absolute: 0.3 x10E3/uL (ref 0.1–0.9)
Monocytes: 7 %
Neutrophils Absolute: 2.7 x10E3/uL (ref 1.4–7.0)
Neutrophils: 61 %
Platelets: 168 x10E3/uL (ref 150–450)
RBC: 4.47 x10E6/uL (ref 4.14–5.80)
RDW: 15.5 % — ABNORMAL HIGH (ref 11.6–15.4)
WBC: 4.6 x10E3/uL (ref 3.4–10.8)

## 2024-04-19 ENCOUNTER — Ambulatory Visit: Admitting: Internal Medicine

## 2024-04-30 ENCOUNTER — Ambulatory Visit: Payer: Self-pay | Admitting: Gastroenterology

## 2024-05-07 NOTE — Progress Notes (Unsigned)
 Jeremy [name], male    DOB: [birth year],    MRN: [id number]   Brief patient profile:  35 yowm quit smoking 1990/MM   p MI  referred to pulmonary clinic in [place]  10/13/2021 by Dr Shona for copd eval with Nov 2022 > ER with dx aecopd with GOLD 2 criteria in 2017 but not requiring any maint rx up until Nov 2022     History of Present Illness  10/13/2021  Pulmonary/ 1st office eval/ Jeremy [name] / [name] / on ACEi/breztri   Chief Complaint  Patient presents with   Consult    Patient had covid in 2019. Patient states that he does feel better now. States that he really needed to be seen a month ago but is better now. States that he is able to walk longer distance now than before.   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 Much worse in am's historically but better now  Cough: none but still urge to clear his throat Sleep: bed blocks/ one pillow SABA use: much less now breztri  one bid / maybe one albuterol  hfa/ neb one each am  Rec Ok to continue lisinopril  but it may be part of the problem with your throat (clearing, choking, gagging, something  stuck, problem swallowing)   Plan A = Automatic = Always=    Breztri  Take 1-2 puffs first thing in am and then another 2 puffs about 12 hours later.   Work on inhaler technique:   Plan B = Backup (to supplement plan A, not to replace it)Only use your albuterol  inhaler as a rescue medication to be used if you can't catch your breath by resting or doing a relaxed purse lip pattern.  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 Cough: min rattling daytime/ no purulent sputum  Sleeping: better at hs on bed blocks  SABA use: alb q am (was supposed to be prn  02: not using  Rec Plan A = Automatic = Always=    performist/yupelri  each am and performist 12 hours later  Plan B = Backup (to supplement plan A, not to replace it) Only use your albuterol  nebulizer as a rescue medication Plan C = Crisis (instead of Plan B but only if Plan B stops working) Prednisone  10 mg take  4 each am x 2 days,   2 each am x 2 days,  1 each am x 2 days and stop  Ok to use Mucinex  dm 1200 mg every 12 hours as needed for cough  Ok to use 02 as needed but goal is to keep the oxygen  meter over 90%   Please keep  follow up office  but call sooner if needed  - bring  all medications/ solutions    06/08/2023  f/u ov/[place] office/Jeremy [name] re: GOLD 2 copd  maint on yupelri  / bud  did  bring meds / still on xarelto for dvt/PE  rx per Dr Milford office  Chief Complaint  Patient presents with   Follow-up    3 month follow up   Dyspnea:  limited more by leg weakness  than breathing  Cough: none  Sleeping: bed blocks and 1 pillow s resp cc  SABA use: none  02: not using   Rec For any flare of cough wheeze short of breath add budesonide  0.25 mg twice daily until better for a whole week     12/07/2023  f/u ov/[name]/Jeremy [name] re: GOLD 2 COPD  maint on yupelri /performist  rarely using budesonide   Chief Complaint  Patient presents with   Follow-up    Hasn't notice any change in breathing since last visit, having some coughing with mucus , no longer taking aspirin  per cardiology because blood in stool   Dyspnea:  no change 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 (not reproduced in office, see walking study)  Cough: none  Sleeping: bed blocks no resp cc  SABA use: not using  02: has it not using  Rec Please remember to go to the  x-ray department  :  cxr copd only  Please schedule  a follow up office visit in 6 weeks, call sooner if needed with all medications /inhalers/ solutions in hand  [birth year]   [facility] AT [facility]  / lelvel 155  Eos 0.2   01/18/2024  f/u ov/[place] office/Jeremy [name] re: GOLD 2 copd  maint on yupelri  did  bring meds  Chief Complaint  Patient presents with   Shortness of Breath   Dyspnea:  improved on yupelri  and formoterol   Cough: none  Sleeping: bed blockes/ one pillow   resp cc  SABA use: none  02: prn  Rec     05/08/2024  f/u ov/Jeremy [name] re: ***   maint on ***  No chief complaint on file.   Dyspnea:  *** Cough: *** Sleeping: *** resp cc  SABA use: *** 02: ***  Lung cancer screening :  ***    No obvious day to day or daytime variability or assoc excess/ purulent sputum or mucus plugs or hemoptysis or cp or chest tightness, subjective wheeze or overt sinus or hb symptoms.    Also denies any obvious fluctuation of symptoms with weather or environmental changes or other aggravating or alleviating factors except as outlined above   No unusual exposure hx or h/o childhood pna/ asthma or knowledge of premature birth.  Current Allergies, Complete Past Medical History, Past Surgical History, Family History, and Social History were reviewed in [employer] record.  ROS  The following are not active complaints unless bolded Hoarseness, sore throat, dysphagia, dental problems, itching, sneezing,  nasal congestion or discharge of excess mucus or purulent secretions, ear ache,   fever, chills, sweats, unintended wt loss or wt gain, classically pleuritic or exertional cp,  orthopnea pnd or arm/hand swelling  or leg swelling, presyncope, palpitations, abdominal pain, anorexia, nausea, vomiting, diarrhea  or change in bowel habits or change in bladder habits, change in stools or change in urine, dysuria, hematuria,  rash, arthralgias, visual complaints, headache, numbness, weakness or ataxia or problems with walking or coordination,  change  in mood or  memory.        No outpatient medications have been marked as taking for the 05/08/24 encounter (Appointment) with Jeremy Ozell NOVAK, MD.             Past Medical History:  Diagnosis Date   Abnormal CXR 4/20011  Chest  CT mode/serere COPD ( no mediastinal abnormality)   CAD (coronary artery disease) 2005   anterior MI with stent to the LAD in 1991 / stent 1996 / nuclear 2005, no ischemia   Carotid bruit    Doppler, June, 2013, 0-39% bilateral   COPD (chronic obstructive pulmonary disease) (HCC)    Diverticula of colon    Ejection fraction    55-60%, echo, April, 2011, could not estimate RV pressure   Esophagitis    HOH (hard of hearing)    Hyperlipidemia    Hypertension    Myocardial infarction (HCC)    x2   Shortness of breath    with exertion   Sleep apnea    Stop Bang score of 5   Tobacco abuse         Objective:    Wts  05/08/2024          ***  01/18/2024         182  12/07/2023       186  06/08/2023       180  04/08/2023       186  02/17/2023         187 01/18/2023         190  11/03/2022       190   08/05/2022     193 02/11/2022       187   12/05/21 185 lb 9.6 oz (84.2 kg)  11/22/21 187 lb (84.8 kg)  10/13/21 187 lb (84.8 kg)    Vital signs reviewed  05/08/2024  - Note at rest 02 sats  ***% on ***   General appearance:    ***   edentulous  Min barr****               Assessment

## 2024-05-08 ENCOUNTER — Ambulatory Visit (INDEPENDENT_AMBULATORY_CARE_PROVIDER_SITE_OTHER): Admitting: Internal Medicine

## 2024-05-08 ENCOUNTER — Encounter: Payer: Self-pay | Admitting: Internal Medicine

## 2024-05-08 VITALS — BP 133/66 | HR 92 | Ht 72.0 in | Wt 184.0 lb

## 2024-05-08 DIAGNOSIS — R0902 Hypoxemia: Secondary | ICD-10-CM | POA: Insufficient documentation

## 2024-05-08 DIAGNOSIS — J449 Chronic obstructive pulmonary disease, unspecified: Secondary | ICD-10-CM

## 2024-05-08 NOTE — Patient Instructions (Addendum)
 No change in medications   We will order you a Portable 02 concentrator or similar more portable system than what you have now but you must use it properly     Make sure you check your oxygen  saturation  AT  your highest level of activity (not after you stop)   to be sure it stays over 90% and adjust  02 flow upward to maintain this level if needed but remember to turn it back to previous settings when you stop (to conserve your supply).   Please schedule a follow up visit in 6  months but call sooner if needed  with all medications /inhalers/ solutions/ oxygen  system  in hand so we can verify exactly what you are taking. This includes all medications from all doctors and over the counters

## 2024-05-08 NOTE — Assessment & Plan Note (Addendum)
 05/08/2024   Walked on RA  x  2  lap(s) =  approx 300  ft  @ mod pace, stopped due to legs hurt > sob  with lowest 02 sats 87% resolved on 2lpm cont flow and able to do another 150 ft   with lowest sats = 96% and no sob or leg pains   Avised on how to adjust 02 sat with ex with goal > 90%  - see avs for instructions unique to this ov

## 2024-05-08 NOTE — Assessment & Plan Note (Addendum)
 Quit smoking 1990 /MM - Spirometry 11/08/15   FEV1 1.89 (58%)  Ratio 0.49 p 10% improvement from saba  - 10/13/2021  After extensive coaching inhaler device,  effectiveness =    75% (short Ti)  - d/c acei 11/22/21 > all wheeze resolve by 12/05/2021 ov - PFT's  12/03/21  FEV1 1.76 (58 % ) ratio 0.42  p 10 % improvement from saba p 0 prior to study with DLCO  14.04 (55%) corrects to 2.66  (69%)  for alv volume and FV curve classically concave    - 11/03/2022  After extensive coaching inhaler device,  effectiveness =    80% (slt delayed trigger) - 01/18/2023 flared on breztri /  neb is the only thing that helps  with assoc throat clearing ? Adverse effects of hfa so rec change to neb formoterol /bud   - 02/17/2023 added yupelri   - 04/08/2023 added prn pred x 6 d and if needing freq next step is add budesonide  to neb  - 04/08/2023   Walked on RA  x  2  lap(s) =  approx 300  ft  @  mod pace, stopped due to sob with lowest 02 sats 89%   - 12/07/2023   Walked on RA  x  lap(s) =  appr ox 150  ft  @ mod pace/ akward gait, stopped due to became tired unsteady on feet  with lowest 02 sats 93% and no sob  - [birth year]   [facility] one AT [facility]  / lelvel 155  Eos 0.2  - 05/08/2024 ex desats documented   Pt is Group B in terms of symptom/risk and laba/lama therefore appropriate rx at this point >>>  perforomist / yupelri  and prn saba  Each maintenance medication was reviewed in detail including emphasizing most importantly the difference between maintenance and prns and under what circumstances the prns are to be triggered using an action plan format where appropriate.  Total time for H and P, chart review, counseling, reviewing 02/pulse ox/ neb   device(s) , directly observing portions of ambulatory 02 saturation study/ and generating customized AVS unique to this office visit / same day charting = 36 min

## 2024-05-08 NOTE — Addendum Note (Signed)
 Addended by: RUFFUS LUKES T on: 05/08/2024 02:30 PM   Modules accepted: Orders

## 2024-05-09 DIAGNOSIS — I2510 Atherosclerotic heart disease of native coronary artery without angina pectoris: Secondary | ICD-10-CM | POA: Diagnosis not present

## 2024-05-09 DIAGNOSIS — I2699 Other pulmonary embolism without acute cor pulmonale: Secondary | ICD-10-CM | POA: Diagnosis not present

## 2024-05-09 DIAGNOSIS — N401 Enlarged prostate with lower urinary tract symptoms: Secondary | ICD-10-CM | POA: Diagnosis not present

## 2024-05-09 DIAGNOSIS — G4700 Insomnia, unspecified: Secondary | ICD-10-CM | POA: Diagnosis not present

## 2024-05-09 DIAGNOSIS — E539 Vitamin B deficiency, unspecified: Secondary | ICD-10-CM | POA: Diagnosis not present

## 2024-05-09 DIAGNOSIS — K219 Gastro-esophageal reflux disease without esophagitis: Secondary | ICD-10-CM | POA: Diagnosis not present

## 2024-05-09 DIAGNOSIS — J309 Allergic rhinitis, unspecified: Secondary | ICD-10-CM | POA: Diagnosis not present

## 2024-05-09 DIAGNOSIS — J449 Chronic obstructive pulmonary disease, unspecified: Secondary | ICD-10-CM | POA: Diagnosis not present

## 2024-05-09 DIAGNOSIS — I10 Essential (primary) hypertension: Secondary | ICD-10-CM | POA: Diagnosis not present

## 2024-05-09 DIAGNOSIS — E782 Mixed hyperlipidemia: Secondary | ICD-10-CM | POA: Diagnosis not present

## 2024-05-09 DIAGNOSIS — I7143 Infrarenal abdominal aortic aneurysm, without rupture: Secondary | ICD-10-CM | POA: Diagnosis not present

## 2024-05-09 DIAGNOSIS — R630 Anorexia: Secondary | ICD-10-CM | POA: Diagnosis not present

## 2024-05-12 DIAGNOSIS — J449 Chronic obstructive pulmonary disease, unspecified: Secondary | ICD-10-CM | POA: Diagnosis not present

## 2024-05-12 DIAGNOSIS — N401 Enlarged prostate with lower urinary tract symptoms: Secondary | ICD-10-CM | POA: Diagnosis not present

## 2024-05-12 DIAGNOSIS — I10 Essential (primary) hypertension: Secondary | ICD-10-CM | POA: Diagnosis not present

## 2024-05-12 DIAGNOSIS — E782 Mixed hyperlipidemia: Secondary | ICD-10-CM | POA: Diagnosis not present

## 2024-05-30 ENCOUNTER — Ambulatory Visit (INDEPENDENT_AMBULATORY_CARE_PROVIDER_SITE_OTHER): Admitting: Gastroenterology

## 2024-05-30 ENCOUNTER — Encounter (INDEPENDENT_AMBULATORY_CARE_PROVIDER_SITE_OTHER): Payer: Self-pay | Admitting: Gastroenterology

## 2024-05-30 VITALS — BP 115/64 | HR 79 | Temp 98.0°F | Ht 72.0 in | Wt 184.7 lb

## 2024-05-30 DIAGNOSIS — K5904 Chronic idiopathic constipation: Secondary | ICD-10-CM | POA: Insufficient documentation

## 2024-05-30 DIAGNOSIS — D649 Anemia, unspecified: Secondary | ICD-10-CM | POA: Diagnosis not present

## 2024-05-30 DIAGNOSIS — Z8719 Personal history of other diseases of the digestive system: Secondary | ICD-10-CM | POA: Diagnosis not present

## 2024-05-30 DIAGNOSIS — K5900 Constipation, unspecified: Secondary | ICD-10-CM | POA: Diagnosis not present

## 2024-05-30 DIAGNOSIS — R1319 Other dysphagia: Secondary | ICD-10-CM

## 2024-05-30 NOTE — Patient Instructions (Signed)
 It was very nice to meet you today, as dicussed with will plan for the following :  1) labwork  2)Ensure adequate fluid intake: Aim for 8 glasses of water  daily. Follow a high fiber diet: Include foods such as dates, prunes, pears, and kiwi. Take Miralax daily

## 2024-05-30 NOTE — Progress Notes (Signed)
 Evelyn Aguinaldo Faizan Traniece Boffa , M.D. Gastroenterology & Hepatology [facility] Gastroenterology [address] [place], [place] [address] Primary Care Physician: Shona Norleen PEDLAR, MD [address] [place] [address]  Chief Complaint: Anemia, intermittent constipation and history of diverticular bleed  History of Present Illness:  This is a very pleasant 88 year old male with history of coronary artery disease, COPD, hypertension, hyperlipidemia, prior diverticular bleeding presents for follow-up of anemia, intermittent constipation and history of diverticular bleed  Patient had hospitalization 01/2024 hematochezia with downtrend hemoglobin underwent CTA and colonoscopy deemed to be diverticular.  Since then patient is doing well no blood in stool.  Has intermittent constipation with a bowel movement every other day  Most recent labs from 04/17/2024 10.8 Colonoscopy 01/15/24: -preparation of colon fair -five 1-10mm polyps removed, tuublar adenomas -diverticulosis  -no need for repeat colonoscopy due to age unless develops new symptosm  CT abdomen pelvis 04/30/2023  1. Nonobstructive punctate left nephrolithiasis. 2. Colonic diverticulosis with no acute diverticulitis. 3. Stable infrarenal abdominal aorta. Recommend follow-up ultrasound every 3 years. This recommendation follows ACR consensus guidelines: White Paper of the ACR Incidental Findings Committee II on Vascular Findings. J Am Coll Radiol 2013; 89:210-205. 4.  Aortic Atherosclerosis (ICD10-I70.0). 5. No acute intra-intrapelvic abnormality with limited evaluation on this noncontrast study.   CTA Chest10/2024  IMPRESSION: -Previous pulmonary emboli are actually decreased from previous. There is some minimal web like areas in the right lung base which could be subacute to chronic. No new large or central embolus. Breathing motion. -Advanced emphysematous lung changes. -Stable small saccular aneurysm along  the aortic arch. -There is a enlarged solitary node identified along the gastrohepatic ligament of uncertain etiology and significance. Additional evaluation can be considered when clinically appropriate.   Endoscopic history  05/2023  - Benign- appearing esophageal stenosis. Dilated. - 2 cm hiatal hernia. - Erythematous mucosa in the antrum. Biopsied. - Normal duodenal bulb and second portion of the duodenum.   No H. Pylori bacteria in stomach, or any early cancer changes to the stomach mucosa ( Intestinal metaplasia)    04/2021 Moderate distal esophageal stricture dilated with balloon dilated to 18 mm. Small sliding hiatal hernia. Normal examination stomach first and second part of duodenum.   Colonoscopy completed with ultraslim scope. Numerous diverticula at sigmoid colon with noncritical stricture at 30 cm from the anal margin traversed with ultraslim scope. No polyps or other abnormalities noted. Anal papillae  Past Medical History: Past Medical History:  Diagnosis Date   Abnormal CXR 4/20011   Chest  CT mode/serere COPD ( no mediastinal abnormality)   CAD (coronary artery disease) 2005   anterior MI with stent to the LAD in 1991 / stent 1996 / nuclear 2005, no ischemia   Carotid bruit    Doppler, June, 2013, 0-39% bilateral   COPD (chronic obstructive pulmonary disease) (HCC)    Diverticula of colon    Ejection fraction    55-60%, echo, April, 2011, could not estimate RV pressure   Esophagitis    Gastroenteritis    HOH (hard of hearing)    Hyperlipidemia    Hypertension    Myocardial infarction (HCC)    x2   Shortness of breath    with exertion   Sleep apnea    Stop Bang score of 5   Tobacco abuse     Past Surgical History: Past Surgical History:  Procedure Laterality Date   APPENDECTOMY     BIOPSY  11/17/2017   Procedure:  BIOPSY;  Surgeon: Golda Claudis PENNER, MD;  [facility];  Service: Endoscopy;;  gastric   BIOPSY  06/11/2023   Procedure:  BIOPSY;  Surgeon: Cinderella Deatrice FALCON, MD;  [facility];  Service: Endoscopy;;   CARDIAC CATHETERIZATION     CATARACT EXTRACTION Left    CATARACT EXTRACTION W/PHACO Right 01/22/2014   Procedure: CATARACT EXTRACTION PHACO AND INTRAOCULAR LENS PLACEMENT (IOC);  Surgeon: Cherene Mania, MD;  [facility];  Service: Ophthalmology;  Laterality: Right;  CDE 7.46   CHOLECYSTECTOMY     8 years ago [facility]   COLONOSCOPY  10/05/2012   Dr. Golda: multiple diverticula at sigmoid colon, multiple polyps (tubular adenomas), small external hemorrhoids   COLONOSCOPY N/A 11/18/2017   Procedure: COLONOSCOPY;  Surgeon: Golda Claudis PENNER, MD;  [facility];  Service: Endoscopy;  Laterality: N/A;   COLONOSCOPY N/A 01/15/2024   Procedure: COLONOSCOPY;  Surgeon: Eartha Angelia Sieving, MD;  [facility];  Service: Gastroenterology;  Laterality: N/A;   COLONOSCOPY WITH PROPOFOL  N/A 05/07/2021   Procedure: COLONOSCOPY WITH PROPOFOL ;  Surgeon: Golda Claudis PENNER, MD;  [facility];  Service: Endoscopy;  Laterality: N/A;   CYST REMOVAL HAND     right wrist   ESOPHAGEAL DILATION N/A 05/23/2015   Procedure: ESOPHAGEAL DILATION;  Surgeon: Lamar CHRISTELLA Hollingshead, MD;  [facility];  Service: Endoscopy;  Laterality: N/A;   ESOPHAGEAL DILATION N/A 11/17/2017   Procedure: ESOPHAGEAL DILATION;  Surgeon: Golda Claudis PENNER, MD;  [facility];  Service: Endoscopy;  Laterality: N/A;   ESOPHAGEAL DILATION N/A 05/07/2021   Procedure: ESOPHAGEAL DILATION;  Surgeon: Golda Claudis PENNER, MD;  [facility];  Service: Endoscopy;  Laterality: N/A;   ESOPHAGOGASTRODUODENOSCOPY N/A 05/23/2015   Dr. Hollingshead: Ulcerative/ erosive reflux esophagitis with peptic stricture formation. Status post dilation as described. Hiatal hernia. Abnormal gastric mucosa of uncertain significance status post gastric biospy. +H.pylori   ESOPHAGOGASTRODUODENOSCOPY N/A 11/17/2017   Procedure: ESOPHAGOGASTRODUODENOSCOPY  (EGD);  Surgeon: Golda Claudis PENNER, MD;  [facility];  Service: Endoscopy;  Laterality: N/A;   ESOPHAGOGASTRODUODENOSCOPY (EGD) WITH PROPOFOL  N/A 05/07/2021   Procedure: ESOPHAGOGASTRODUODENOSCOPY (EGD) WITH PROPOFOL ;  Surgeon: Golda Claudis PENNER, MD;  [facility];  Service: Endoscopy;  Laterality: N/A;   ESOPHAGOGASTRODUODENOSCOPY (EGD) WITH PROPOFOL  N/A 06/11/2023   Procedure: ESOPHAGOGASTRODUODENOSCOPY (EGD) WITH PROPOFOL ;  Surgeon: Cinderella Deatrice FALCON, MD;  [facility];  Service: Endoscopy;  Laterality: N/A;  8:45AM;ASA 3   PERCUTANEOUS STENT INTERVENTION     SKIN CANCER EXCISION  2015   ear & neck   TONSILLECTOMY      Family History: Family History  Problem Relation Age of Onset   Heart disease Mother    Heart disease Father    Heart disease Brother    Stomach cancer Brother    Colon cancer Neg Hx     Social History: Social History   Tobacco Use  Smoking Status Former   Current packs/day: 0.00   Average packs/day: 3.0 packs/day for 12.0 years (36.0 ttl pk-yrs)   Types: Cigarettes   Start date: 09/15/1983   Quit date: 09/14/1988   Years since quitting: 35.7  Smokeless Tobacco Current   Types: Chew  Tobacco Comments   chews 80% of a pack tobacco per day -- 10/12 doesnt chew too much   Social History   Substance and Sexual Activity  Alcohol Use No   Alcohol/week: 0.0 standard drinks of alcohol   Social  History   Substance and Sexual Activity  Drug Use No    Allergies: Allergies  Allergen Reactions   Ibuprofen      Gi bleed     Medications: Current Outpatient Medications  Medication Sig Dispense Refill   Acetaminophen  (TYLENOL  8 HOUR PO) Take by mouth. Pt is taking 500mg      aspirin  EC 81 MG tablet Take 81 mg by mouth daily. Swallow whole.     atorvastatin  (LIPITOR) 80 MG tablet Take 1 tablet (80 mg total) by mouth at bedtime. 90 tablet 2   formoterol  (PERFOROMIST ) 20 MCG/2ML nebulizer solution Substituted for: Perforomist   Solution Inhale one vial in nebulizer twice a day. Dx: J44.9 2 mL 11   hydrOXYzine  (ATARAX ) 25 MG tablet Take 25 mg by mouth at bedtime as needed.     ipratropium-albuterol  (DUONEB) 0.5-2.5 (3) MG/3ML SOLN Take 3 mLs by nebulization every 4 (four) hours as needed. 360 mL 0   Iron Combinations (CHROMAGEN) capsule Take 1 capsule by mouth daily.     losartan  (COZAAR ) 100 MG tablet Take 100 mg by mouth daily.     Multiple Vitamins-Minerals (MULTIVITAMIN WITH MINERALS) tablet Take 1 tablet by mouth daily.     nitroGLYCERIN  (NITROSTAT ) 0.4 MG SL tablet PLACE 1 TABLET UNDER THE TONGUE EVERY 5 MINUTES AS NEEDED FOR CHEST PAIN. MAX 3 TABLETS PER EVENT. CALL 911 25 tablet 3   pantoprazole  (PROTONIX ) 40 MG tablet TAKE 1 TABLET(40 MG) BY MOUTH TWICE DAILY 180 tablet 3   polyethylene glycol powder (GLYCOLAX/MIRALAX) 17 GM/SCOOP powder Take by mouth once.     Psyllium (DAILY FIBER PO) Take by mouth.     revefenacin  (YUPELRI ) 175 MCG/3ML nebulizer solution Take 3 mLs (175 mcg total) by nebulization daily. 90 mL 11   No current facility-administered medications for this visit.    Review of Systems: GENERAL: negative for malaise, night sweats HEENT: No changes in hearing or vision, no nose bleeds or other nasal problems. NECK: Negative for lumps, goiter, pain and significant neck swelling RESPIRATORY: Negative for cough, wheezing CARDIOVASCULAR: Negative for chest pain, leg swelling, palpitations, orthopnea GI: SEE HPI MUSCULOSKELETAL: Negative for joint pain or swelling, back pain, and muscle pain. SKIN: Negative for lesions, rash HEMATOLOGY Negative for prolonged bleeding, bruising easily, and swollen nodes. ENDOCRINE: Negative for cold or heat intolerance, polyuria, polydipsia and goiter. NEURO: negative for tremor, gait imbalance, syncope and seizures. The remainder of the review of systems is noncontributory.   Physical Exam: BP 115/64   Pulse 79   Temp 98 F (36.7 C)   Ht 6' (1.829 m)   Wt  184 lb 11.2 oz (83.8 kg)   BMI 25.05 kg/m  GENERAL: The patient is AO x3, in no acute distress. HEENT: Head is normocephalic and atraumatic. EOMI are intact. Mouth is well hydrated and without lesions. NECK: Supple. No masses LUNGS: Clear to auscultation. No presence of rhonchi/wheezing/rales. Adequate chest expansion HEART: RRR, normal s1 and s2. ABDOMEN: Soft, nontender, no guarding, no peritoneal signs, and nondistended. BS +. No masses.   Imaging/Labs: as above     Latest Ref Rng & Units 04/17/2024    9:06 AM 01/15/2024    7:17 AM 01/14/2024    4:43 AM  CBC  WBC 3.4 - 10.8 x10E3/uL 4.6  3.8  4.4   Hemoglobin 13.0 - 17.7 g/dL 89.1  9.9  9.5   Hematocrit 37.5 - 51.0 % 36.2  31.0  30.1   Platelets 150 - 450 x10E3/uL 168  156  159  Lab Results  Component Value Date   FERRITIN 198 04/19/2019    I personally reviewed and interpreted the available labs, imaging and endoscopic files.  This is a very pleasant 88 year old male with history of coronary artery disease, COPD, hypertension, hyperlipidemia, prior diverticular bleeding presents for follow-up of anemia, intermittent constipation and history of diverticular bleed  # Normocytic anemia # Intermittent constipation # History of diverticular bleed  Patient had colonoscopy 01/2024 and a CT at that time  I discussed with patient and wife the mainstay treatment here remains avoiding constipation and avoiding NSAIDs  Ensure adequate fluid intake: Aim for 8 glasses of water  daily. Follow a high fiber diet: Include foods such as dates, prunes, pears, and kiwi. Take Miralax daily  Last blood work from 1 month ago with uptrending hemoglobin.  Will obtain repeat CBC along with iron profile vitamin B12 and folate  No absolute GI contraindication for continuing aspirin  81 mg while clinically indicated  All questions were answered.      Devetta Hagenow Faizan Aryan Bello, MD Gastroenterology and Hepatology [facility]  Gastroenterology   This chart has been completed using [facility] Dictation software, and while attempts have been made to ensure accuracy , certain words and phrases may not be transcribed as intended

## 2024-06-07 DIAGNOSIS — N401 Enlarged prostate with lower urinary tract symptoms: Secondary | ICD-10-CM | POA: Diagnosis not present

## 2024-06-07 DIAGNOSIS — I10 Essential (primary) hypertension: Secondary | ICD-10-CM | POA: Diagnosis not present

## 2024-06-07 DIAGNOSIS — I7143 Infrarenal abdominal aortic aneurysm, without rupture: Secondary | ICD-10-CM | POA: Diagnosis not present

## 2024-06-07 DIAGNOSIS — K219 Gastro-esophageal reflux disease without esophagitis: Secondary | ICD-10-CM | POA: Diagnosis not present

## 2024-06-07 DIAGNOSIS — E539 Vitamin B deficiency, unspecified: Secondary | ICD-10-CM | POA: Diagnosis not present

## 2024-06-07 DIAGNOSIS — J309 Allergic rhinitis, unspecified: Secondary | ICD-10-CM | POA: Diagnosis not present

## 2024-06-07 DIAGNOSIS — J449 Chronic obstructive pulmonary disease, unspecified: Secondary | ICD-10-CM | POA: Diagnosis not present

## 2024-06-07 DIAGNOSIS — I2699 Other pulmonary embolism without acute cor pulmonale: Secondary | ICD-10-CM | POA: Diagnosis not present

## 2024-06-07 DIAGNOSIS — R630 Anorexia: Secondary | ICD-10-CM | POA: Diagnosis not present

## 2024-06-07 DIAGNOSIS — G4700 Insomnia, unspecified: Secondary | ICD-10-CM | POA: Diagnosis not present

## 2024-06-07 DIAGNOSIS — E782 Mixed hyperlipidemia: Secondary | ICD-10-CM | POA: Diagnosis not present

## 2024-06-07 DIAGNOSIS — I2510 Atherosclerotic heart disease of native coronary artery without angina pectoris: Secondary | ICD-10-CM | POA: Diagnosis not present

## 2024-06-12 DIAGNOSIS — D649 Anemia, unspecified: Secondary | ICD-10-CM | POA: Diagnosis not present

## 2024-06-13 DIAGNOSIS — K219 Gastro-esophageal reflux disease without esophagitis: Secondary | ICD-10-CM | POA: Diagnosis not present

## 2024-06-13 DIAGNOSIS — J449 Chronic obstructive pulmonary disease, unspecified: Secondary | ICD-10-CM | POA: Diagnosis not present

## 2024-06-13 DIAGNOSIS — E782 Mixed hyperlipidemia: Secondary | ICD-10-CM | POA: Diagnosis not present

## 2024-06-13 DIAGNOSIS — I10 Essential (primary) hypertension: Secondary | ICD-10-CM | POA: Diagnosis not present

## 2024-06-13 LAB — CBC WITH DIFFERENTIAL/PLATELET
Basophils Absolute: 0 x10E3/uL (ref 0.0–0.2)
Basos: 1 %
EOS (ABSOLUTE): 0.2 x10E3/uL (ref 0.0–0.4)
Eos: 6 %
Hematocrit: 40.4 % (ref 37.5–51.0)
Hemoglobin: 12 g/dL — ABNORMAL LOW (ref 13.0–17.7)
Immature Grans (Abs): 0 x10E3/uL (ref 0.0–0.1)
Immature Granulocytes: 0 %
Lymphocytes Absolute: 1.1 x10E3/uL (ref 0.7–3.1)
Lymphs: 27 %
MCH: 24.6 pg — ABNORMAL LOW (ref 26.6–33.0)
MCHC: 29.7 g/dL — ABNORMAL LOW (ref 31.5–35.7)
MCV: 83 fL (ref 79–97)
Monocytes Absolute: 0.3 x10E3/uL (ref 0.1–0.9)
Monocytes: 7 %
Neutrophils Absolute: 2.3 x10E3/uL (ref 1.4–7.0)
Neutrophils: 59 %
Platelets: 171 x10E3/uL (ref 150–450)
RBC: 4.87 x10E6/uL (ref 4.14–5.80)
RDW: 17.8 % — ABNORMAL HIGH (ref 11.6–15.4)
WBC: 3.9 x10E3/uL (ref 3.4–10.8)

## 2024-06-13 LAB — IRON,TIBC AND FERRITIN PANEL
Ferritin: 19 ng/mL — ABNORMAL LOW (ref 30–400)
Iron Saturation: 14 % — ABNORMAL LOW (ref 15–55)
Iron: 45 ug/dL (ref 38–169)
Total Iron Binding Capacity: 329 ug/dL (ref 250–450)
UIBC: 284 ug/dL (ref 111–343)

## 2024-06-13 LAB — VITAMIN B12: Vitamin B-12: 607 pg/mL (ref 232–1245)

## 2024-06-13 LAB — FOLATE: Folate: 5.1 ng/mL (ref >3.0)

## 2024-06-14 ENCOUNTER — Ambulatory Visit (INDEPENDENT_AMBULATORY_CARE_PROVIDER_SITE_OTHER): Payer: Self-pay | Admitting: Gastroenterology

## 2024-06-14 NOTE — Progress Notes (Signed)
 Hi Ann ,  Can you please arrange a referral for this patient to hematology   Diagnosis: Iron deficiency anemia    Thanks, ==============  Hi Tanya ,  Can you please call the patient and tell the patient the lab work shows that your blood count has come up which is a good news . Although it shows you are iron deficient and would recommend to see the blood doctors for possible iron infusion , we will refer you to hematology   Thanks,  Siddalee Vanderheiden Faizan Shaquelle Hernon, MD Gastroenterology and Hepatology [facility] Gastroenterology  ========= Improved hemoglobin   HBG : 10---->12 Iron sat:14 Ferritin : 19

## 2024-06-14 NOTE — Progress Notes (Signed)
 Referral sent, they will contact patient with apt

## 2024-07-04 DIAGNOSIS — N401 Enlarged prostate with lower urinary tract symptoms: Secondary | ICD-10-CM | POA: Diagnosis not present

## 2024-07-04 DIAGNOSIS — J309 Allergic rhinitis, unspecified: Secondary | ICD-10-CM | POA: Diagnosis not present

## 2024-07-04 DIAGNOSIS — R630 Anorexia: Secondary | ICD-10-CM | POA: Diagnosis not present

## 2024-07-04 DIAGNOSIS — I2699 Other pulmonary embolism without acute cor pulmonale: Secondary | ICD-10-CM | POA: Diagnosis not present

## 2024-07-04 DIAGNOSIS — K219 Gastro-esophageal reflux disease without esophagitis: Secondary | ICD-10-CM | POA: Diagnosis not present

## 2024-07-04 DIAGNOSIS — I7143 Infrarenal abdominal aortic aneurysm, without rupture: Secondary | ICD-10-CM | POA: Diagnosis not present

## 2024-07-04 DIAGNOSIS — I2510 Atherosclerotic heart disease of native coronary artery without angina pectoris: Secondary | ICD-10-CM | POA: Diagnosis not present

## 2024-07-04 DIAGNOSIS — E782 Mixed hyperlipidemia: Secondary | ICD-10-CM | POA: Diagnosis not present

## 2024-07-04 DIAGNOSIS — J449 Chronic obstructive pulmonary disease, unspecified: Secondary | ICD-10-CM | POA: Diagnosis not present

## 2024-07-04 DIAGNOSIS — I10 Essential (primary) hypertension: Secondary | ICD-10-CM | POA: Diagnosis not present

## 2024-07-04 DIAGNOSIS — G4700 Insomnia, unspecified: Secondary | ICD-10-CM | POA: Diagnosis not present

## 2024-07-04 DIAGNOSIS — E539 Vitamin B deficiency, unspecified: Secondary | ICD-10-CM | POA: Diagnosis not present

## 2024-07-05 ENCOUNTER — Ambulatory Visit: Attending: Cardiology | Admitting: Cardiology

## 2024-07-05 ENCOUNTER — Encounter: Payer: Self-pay | Admitting: Cardiology

## 2024-07-05 ENCOUNTER — Other Ambulatory Visit (HOSPITAL_BASED_OUTPATIENT_CLINIC_OR_DEPARTMENT_OTHER): Payer: Self-pay

## 2024-07-05 VITALS — BP 136/74 | HR 76 | Ht 72.0 in | Wt 185.0 lb

## 2024-07-05 DIAGNOSIS — I2510 Atherosclerotic heart disease of native coronary artery without angina pectoris: Secondary | ICD-10-CM | POA: Insufficient documentation

## 2024-07-05 DIAGNOSIS — I10 Essential (primary) hypertension: Secondary | ICD-10-CM | POA: Diagnosis not present

## 2024-07-05 DIAGNOSIS — E782 Mixed hyperlipidemia: Secondary | ICD-10-CM | POA: Insufficient documentation

## 2024-07-05 MED ORDER — NITROGLYCERIN 0.4 MG SL SUBL
0.4000 mg | SUBLINGUAL_TABLET | SUBLINGUAL | 3 refills | Status: AC | PRN
  Filled 2024-07-05: qty 25, 8d supply, fill #0

## 2024-07-05 NOTE — Patient Instructions (Addendum)

## 2024-07-05 NOTE — Progress Notes (Signed)
 Clinical Summary Jeremy [name] is a 88 y.o.male seen for the following medical problems.    1. CAD - history of prior stenting to LAD - nuclear stress 03/2014 with no ischemia. LVEF 47%.  - admit 04/2016 with chest pain, negative EKG and enzymes. Symptoms better with belching, thought GI etiology. He had been off his protonix  at the time. Symptoms improved after restarting.     - 03/2022 echo LVEF 50-55%, grade I dd,  - 03/2023 echo: LVEF 65-70%, poorly seen RV grossly normal   - no chest pains. SOB at times, better with prn nebulizer.  - compliant with meds   2. COPD - PFTs 10/2015 with moderate COPD 11/2021 admission with COPD exacerbation  - admit 03/2023 after COPD exacerbation.      3. Hyperlipidemia - he is on atorvastatin  80mg  daily - 03/2023 TC 113 TG 73 HDL 38 LDL 60 - 08/2023 TC 106 TG 69 HDL 37 LDL 54 - 02/2024 TC 895 TG 66 HDL 36 LDL 54 - upcoming labs with pcp in Nov     4. History of GI bleed - admit 11/2017 with rectal bleeding. EGD showed esophagitis, gastritis but no active bleeding.  - admit 8/29024. From notes thought to be self limiting divericular bleed. - admit 01/2024 with GI bleed   5. HTN - he is compliant with meds     6. Leg pain - no pain at rest. Pins and needles like pain, lower back pain No consistent with claudicatio   7. 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  Past Medical History:  Diagnosis Date   Abnormal CXR 4/20011   Chest  CT mode/serere COPD ( no mediastinal abnormality)   CAD (coronary artery disease) 2005   anterior MI with stent to the LAD in 1991 / stent 1996 / nuclear 2005, no ischemia   Carotid bruit    Doppler, June, 2013, 0-39% bilateral   COPD (chronic obstructive pulmonary disease) (HCC)    Diverticula of colon    Ejection  fraction    55-60%, echo, April, 2011, could not estimate RV pressure   Esophagitis    Gastroenteritis    HOH (hard of hearing)    Hyperlipidemia    Hypertension    Myocardial infarction (HCC)    x2   Shortness of breath    with exertion   Sleep apnea    Stop Bang score of 5   Tobacco abuse      Allergies  Allergen Reactions   Ibuprofen      Gi bleed      Current Outpatient Medications  Medication Sig Dispense Refill   Acetaminophen  (TYLENOL  8 HOUR PO) Take by mouth. Pt is taking 500mg      aspirin  EC 81 MG tablet Take 81 mg by mouth daily. Swallow whole.     atorvastatin  (LIPITOR) 80 MG tablet Take 1 tablet (80 mg total) by mouth at bedtime. 90 tablet 2   formoterol  (PERFOROMIST ) 20 MCG/2ML nebulizer solution Substituted for: Perforomist  Solution Inhale one vial in nebulizer twice a day. Dx: J44.9 2 mL 11   hydrOXYzine  (ATARAX ) 25 MG tablet Take 25 mg by mouth at bedtime as needed.     ipratropium-albuterol  (DUONEB) 0.5-2.5 (3) MG/3ML SOLN Take 3 mLs  by nebulization every 4 (four) hours as needed. 360 mL 0   Iron Combinations (CHROMAGEN) capsule Take 1 capsule by mouth daily.     losartan  (COZAAR ) 100 MG tablet Take 100 mg by mouth daily.     Multiple Vitamins-Minerals (MULTIVITAMIN WITH MINERALS) tablet Take 1 tablet by mouth daily.     nitroGLYCERIN  (NITROSTAT ) 0.4 MG SL tablet PLACE 1 TABLET UNDER THE TONGUE EVERY 5 MINUTES AS NEEDED FOR CHEST PAIN. MAX 3 TABLETS PER EVENT. CALL 911 25 tablet 3   pantoprazole  (PROTONIX ) 40 MG tablet TAKE 1 TABLET(40 MG) BY MOUTH TWICE DAILY 180 tablet 3   polyethylene glycol powder (GLYCOLAX/MIRALAX) 17 GM/SCOOP powder Take by mouth once.     Psyllium (DAILY FIBER PO) Take by mouth.     revefenacin  (YUPELRI ) 175 MCG/3ML nebulizer solution Take 3 mLs (175 mcg total) by nebulization daily. 90 mL 11   No current facility-administered medications for this visit.     Past Surgical History:  Procedure Laterality Date   APPENDECTOMY      BIOPSY  11/17/2017   Procedure: BIOPSY;  Surgeon: Golda Claudis PENNER, MD;  [facility];  Service: Endoscopy;;  gastric   BIOPSY  06/11/2023   Procedure: BIOPSY;  Surgeon: Cinderella Deatrice FALCON, MD;  [facility];  Service: Endoscopy;;   CARDIAC CATHETERIZATION     CATARACT EXTRACTION Left    CATARACT EXTRACTION W/PHACO Right 01/22/2014   Procedure: CATARACT EXTRACTION PHACO AND INTRAOCULAR LENS PLACEMENT (IOC);  Surgeon: Cherene Mania, MD;  [facility];  Service: Ophthalmology;  Laterality: Right;  CDE 7.46   CHOLECYSTECTOMY     8 years ago [facility]   COLONOSCOPY  10/05/2012   Dr. Golda: multiple diverticula at sigmoid colon, multiple polyps (tubular adenomas), small external hemorrhoids   COLONOSCOPY N/A 11/18/2017   Procedure: COLONOSCOPY;  Surgeon: Golda Claudis PENNER, MD;  [facility];  Service: Endoscopy;  Laterality: N/A;   COLONOSCOPY N/A 01/15/2024   Procedure: COLONOSCOPY;  Surgeon: Eartha Angelia Sieving, MD;  [facility];  Service: Gastroenterology;  Laterality: N/A;   COLONOSCOPY WITH PROPOFOL  N/A 05/07/2021   Procedure: COLONOSCOPY WITH PROPOFOL ;  Surgeon: Golda Claudis PENNER, MD;  [facility];  Service: Endoscopy;  Laterality: N/A;   CYST REMOVAL HAND     right wrist   ESOPHAGEAL DILATION N/A 05/23/2015   Procedure: ESOPHAGEAL DILATION;  Surgeon: Lamar CHRISTELLA Hollingshead, MD;  [facility];  Service: Endoscopy;  Laterality: N/A;   ESOPHAGEAL DILATION N/A 11/17/2017   Procedure: ESOPHAGEAL DILATION;  Surgeon: Golda Claudis PENNER, MD;  [facility];  Service: Endoscopy;  Laterality: N/A;   ESOPHAGEAL DILATION N/A 05/07/2021   Procedure: ESOPHAGEAL DILATION;  Surgeon: Golda Claudis PENNER, MD;  [facility];  Service: Endoscopy;  Laterality: N/A;   ESOPHAGOGASTRODUODENOSCOPY N/A 05/23/2015   Dr. Hollingshead: Ulcerative/ erosive reflux esophagitis with peptic stricture formation. Status post dilation as described. Hiatal hernia. Abnormal  gastric mucosa of uncertain significance status post gastric biospy. +H.pylori   ESOPHAGOGASTRODUODENOSCOPY N/A 11/17/2017   Procedure: ESOPHAGOGASTRODUODENOSCOPY (EGD);  Surgeon: Golda Claudis PENNER, MD;  [facility];  Service: Endoscopy;  Laterality: N/A;   ESOPHAGOGASTRODUODENOSCOPY (EGD) WITH PROPOFOL  N/A 05/07/2021   Procedure: ESOPHAGOGASTRODUODENOSCOPY (EGD) WITH PROPOFOL ;  Surgeon: Golda Claudis PENNER, MD;  [facility];  Service: Endoscopy;  Laterality: N/A;   ESOPHAGOGASTRODUODENOSCOPY (EGD) WITH PROPOFOL  N/A 06/11/2023   Procedure: ESOPHAGOGASTRODUODENOSCOPY (EGD) WITH PROPOFOL ;  Surgeon: Cinderella Deatrice FALCON, MD;  [facility];  Service: Endoscopy;  Laterality: N/A;  8:45AM;ASA 3   PERCUTANEOUS STENT INTERVENTION     SKIN CANCER EXCISION  2015   ear & neck   TONSILLECTOMY       Allergies  Allergen Reactions   Ibuprofen      Gi bleed       Family History  Problem Relation Age of Onset   Heart disease Mother    Heart disease Father    Heart disease Brother    Stomach cancer Brother    Colon cancer Neg Hx      Social History Mr. Youngberg reports that he quit smoking about 35 years ago. His smoking use included cigarettes. He started smoking about 40 years ago. He has a 36 pack-year smoking history. His smokeless tobacco use includes chew. Mr. Stroot reports no history of alcohol use.    Physical Examination Today's Vitals   07/05/24 1155  BP: 136/74  Pulse: 76  SpO2: 95%  Weight: 185 lb (83.9 kg)  Height: 6' (1.829 m)   Body mass index is 25.09 kg/m.  Gen: resting comfortably, no acute distress HEENT: no scleral icterus, pupils equal round and reactive, no palptable cervical adenopathy,  CV: RRR, no mrg, no jvd Resp: Clear to auscultation bilaterally GI: abdomen is soft, non-tender, non-distended, normal bowel sounds, no hepatosplenomegaly MSK: extremities are warm, no edema.  Skin: warm, no rash Neuro:  no focal deficits Psych:  appropriate affect   Diagnostic Studies  03/2022 echo 1. Left ventricular ejection fraction, by estimation, is approximately  55%. The left ventricle has normal function. The left ventricle has no  regional wall motion abnormalities. Left ventricular diastolic parameters  are consistent with Grade I diastolic   dysfunction (impaired relaxation). The average left ventricular global  longitudinal strain is -18.0 %. The global longitudinal strain is normal.   2. Right ventricular systolic function is normal. The right ventricular  size is normal. There is normal pulmonary artery systolic pressure. The  estimated right ventricular systolic pressure is 11.6 mmHg.   3. The mitral valve is grossly normal. Trivial mitral valve  regurgitation.   4. The aortic valve is tricuspid. Aortic valve regurgitation is not  visualized. Aortic valve sclerosis is present, with no evidence of aortic  valve stenosis.   5. The inferior vena cava is normal in size with greater than 50%  respiratory variability, suggesting right atrial pressure of 3 mmHg.    Assessment and Plan   1. CAD - no recent symptoms, continue current meds - EKG today shows SR, no acute ischemic changes   2. Hyperlipidemia -at goal, continue current meds     3. HTN - at goal, continue current meds     Dorn PHEBE Ross, M.D.

## 2024-07-06 NOTE — Progress Notes (Unsigned)
 [facility]   [phone] [phone]   Clinic New Consult Note   Patient Care Team: Shona Norleen PEDLAR, MD as PCP - General (Internal Medicine) Alvan, Dorn FALCON, MD as PCP - Cardiology (Cardiology) Darlean Ozell NOVAK, MD as Consulting Physician (Pulmonary Disease) 07/07/2024  CHIEF COMPLAINTS/PURPOSE OF CONSULTATION:  IDA  REFERRING PHYSICIAN: Cinderella Deatrice FALCON MD  Discussed the use of AI scribe software for clinical note transcription with the patient, who gave verbal consent to proceed.  History of Present Illness Jeremy [name] is an 88 year old male who presents for a new consult regarding anemia. He was referred by his gastroenterologist for evaluation of anemia.  He has a history of rectal bleeding, initially severe and leading to hospitalization a few years ago, attributed to ibuprofen  use. Despite discontinuing ibuprofen , another episode occurred, and a colonoscopy in May 2020 revealed a few polyps. He has not experienced rectal bleeding since May 2025. He has not needed any blood transfusion.  He has mild anemia, persisting despite taking over-the-counter oral iron supplements three times a week for the past six months. His hemoglobin was 12 three weeks ago, ferritin was still low at 19, and iron saturation was also low at 14%.  He has mild dyspnea on exertion.  He has a history of heart disease with three heart attacks and three stents. He also has COPD and emphysema, contributing to shortness of breath. He uses a nebulizer and has oxygen  available to improve walking endurance. He quit smoking over 30 years ago but continues to use chewing tobacco.  Current medications include an inhaler, pantoprazole , atorvastatin , losartan , iron pills, a multivitamin, and Flomax . He previously took simvastatin , which was changed due to breathing issues.  No current rectal bleeding, and stools are not dark or black. He does not currently use oxygen  but has  it available if needed.     MEDICAL HISTORY:  Past Medical History:  Diagnosis Date   Abnormal CXR 4/20011   Chest  CT mode/serere COPD ( no mediastinal abnormality)   CAD (coronary artery disease) 2005   anterior MI with stent to the LAD in 1991 / stent 1996 / nuclear 2005, no ischemia   Carotid bruit    Doppler, June, 2013, 0-39% bilateral   COPD (chronic obstructive pulmonary disease) (HCC)    Diverticula of colon    Ejection fraction    55-60%, echo, April, 2011, could not estimate RV pressure   Esophagitis    Gastroenteritis    HOH (hard of hearing)    Hyperlipidemia    Hypertension    Myocardial infarction (HCC)    x2   Shortness of breath    with exertion   Sleep apnea    Stop Bang score of 5   Tobacco abuse     SURGICAL HISTORY: Past Surgical History:  Procedure Laterality Date   APPENDECTOMY     BIOPSY  11/17/2017   Procedure: BIOPSY;  Surgeon: Golda Claudis PENNER, MD;  [facility];  Service: Endoscopy;;  gastric   BIOPSY  06/11/2023   Procedure: BIOPSY;  Surgeon: Cinderella Deatrice FALCON, MD;  [facility];  Service: Endoscopy;;   CARDIAC CATHETERIZATION     CATARACT EXTRACTION Left    CATARACT EXTRACTION W/PHACO Right 01/22/2014   Procedure: CATARACT EXTRACTION PHACO AND INTRAOCULAR LENS PLACEMENT (IOC);  Surgeon: Cherene Mania, MD;  Location: [facility];  Service: Ophthalmology;  Laterality: Right;  CDE 7.46   CHOLECYSTECTOMY  8 years ago [facility]   COLONOSCOPY  10/05/2012   Dr. Golda: multiple diverticula at sigmoid colon, multiple polyps (tubular adenomas), small external hemorrhoids   COLONOSCOPY N/A 11/18/2017   Procedure: COLONOSCOPY;  Surgeon: Golda Claudis PENNER, MD;  [facility];  Service: Endoscopy;  Laterality: N/A;   COLONOSCOPY N/A 01/15/2024   Procedure: COLONOSCOPY;  Surgeon: Eartha Angelia Sieving, MD;  [facility];  Service: Gastroenterology;  Laterality: N/A;   COLONOSCOPY WITH PROPOFOL  N/A 05/07/2021   Procedure:  COLONOSCOPY WITH PROPOFOL ;  Surgeon: Golda Claudis PENNER, MD;  [facility];  Service: Endoscopy;  Laterality: N/A;   CYST REMOVAL HAND     right wrist   ESOPHAGEAL DILATION N/A 05/23/2015   Procedure: ESOPHAGEAL DILATION;  Surgeon: Lamar CHRISTELLA Hollingshead, MD;  [facility];  Service: Endoscopy;  Laterality: N/A;   ESOPHAGEAL DILATION N/A 11/17/2017   Procedure: ESOPHAGEAL DILATION;  Surgeon: Golda Claudis PENNER, MD;  [facility];  Service: Endoscopy;  Laterality: N/A;   ESOPHAGEAL DILATION N/A 05/07/2021   Procedure: ESOPHAGEAL DILATION;  Surgeon: Golda Claudis PENNER, MD;  [facility];  Service: Endoscopy;  Laterality: N/A;   ESOPHAGOGASTRODUODENOSCOPY N/A 05/23/2015   Dr. Hollingshead: Ulcerative/ erosive reflux esophagitis with peptic stricture formation. Status post dilation as described. Hiatal hernia. Abnormal gastric mucosa of uncertain significance status post gastric biospy. +H.pylori   ESOPHAGOGASTRODUODENOSCOPY N/A 11/17/2017   Procedure: ESOPHAGOGASTRODUODENOSCOPY (EGD);  Surgeon: Golda Claudis PENNER, MD;  [facility];  Service: Endoscopy;  Laterality: N/A;   ESOPHAGOGASTRODUODENOSCOPY (EGD) WITH PROPOFOL  N/A 05/07/2021   Procedure: ESOPHAGOGASTRODUODENOSCOPY (EGD) WITH PROPOFOL ;  Surgeon: Golda Claudis PENNER, MD;  [facility];  Service: Endoscopy;  Laterality: N/A;   ESOPHAGOGASTRODUODENOSCOPY (EGD) WITH PROPOFOL  N/A 06/11/2023   Procedure: ESOPHAGOGASTRODUODENOSCOPY (EGD) WITH PROPOFOL ;  Surgeon: Cinderella Deatrice FALCON, MD;  [facility];  Service: Endoscopy;  Laterality: N/A;  8:45AM;ASA 3   PERCUTANEOUS STENT INTERVENTION     SKIN CANCER EXCISION  2015   ear & neck   TONSILLECTOMY      SOCIAL HISTORY: Social History   Socioeconomic History   Marital status: Married    Spouse name: Not on file   Number of children: Not on file   Years of education: Not on file   Highest education level: Not on file  Occupational History   Not on file   Tobacco Use   Smoking status: Former    Current packs/day: 0.00    Average packs/day: 3.0 packs/day for 12.0 years (36.0 ttl pk-yrs)    Types: Cigarettes    Start date: 09/15/1983    Quit date: 09/14/1988    Years since quitting: 35.8   Smokeless tobacco: Current    Types: Chew   Tobacco comments:    chews 80% of a pack tobacco per day -- 10/12 doesnt chew too much  Vaping Use   Vaping status: Never Used  Substance and Sexual Activity   Alcohol use: No    Alcohol/week: 0.0 standard drinks of alcohol   Drug use: No   Sexual activity: Yes    Partners: Female  Other Topics Concern   Not on file  Social History Narrative   Not on file   Social Drivers of Health   Financial Resource Strain: Not on file  Food Insecurity: No Food Insecurity (07/07/2024)   Hunger Vital Sign    Worried About Running Out of Food in the Last Year: Never true  Ran Out of Food in the Last Year: Never true  Transportation Needs: No Transportation Needs (07/07/2024)   PRAPARE - Administrator, Civil Service (Medical): No    Lack of Transportation (Non-Medical): No  Physical Activity: Not on file  Stress: Not on file  Social Connections: Moderately Isolated (01/13/2024)   Social Connection and Isolation Panel    Frequency of Communication with Friends and Family: More than three times a week    Frequency of Social Gatherings with Friends and Family: More than three times a week    Attends Religious Services: Never    Database administrator or Organizations: No    Attends Banker Meetings: Never    Marital Status: Married  Catering manager Violence: Not At Risk (07/07/2024)   Humiliation, Afraid, Rape, and Kick questionnaire    Fear of Current or Ex-Partner: No    Emotionally Abused: No    Physically Abused: No    Sexually Abused: No    FAMILY HISTORY: Family History  Problem Relation Age of Onset   Heart disease Mother    Heart disease Father    Heart disease Brother     Stomach cancer Brother    Colon cancer Neg Hx     ALLERGIES:  is allergic to ibuprofen .  MEDICATIONS:  Current Outpatient Medications  Medication Sig Dispense Refill   Acetaminophen  (TYLENOL  8 HOUR PO) Take by mouth. Pt is taking 500mg      aspirin  EC 81 MG tablet Take 81 mg by mouth daily. Swallow whole.     atorvastatin  (LIPITOR) 80 MG tablet Take 1 tablet (80 mg total) by mouth at bedtime. 90 tablet 2   formoterol  (PERFOROMIST ) 20 MCG/2ML nebulizer solution Substituted for: Perforomist  Solution Inhale one vial in nebulizer twice a day. Dx: J44.9 2 mL 11   hydrOXYzine  (ATARAX ) 25 MG tablet Take 25 mg by mouth at bedtime as needed.     Iron Combinations (CHROMAGEN) capsule Take 1 capsule by mouth daily. (Patient taking differently: Take 1 capsule by mouth daily. Taking 3 times a week)     levocetirizine (XYZAL) 5 MG tablet Take 5 mg by mouth daily.     losartan  (COZAAR ) 100 MG tablet Take 100 mg by mouth daily.     Menthol, Topical Analgesic, (BIOFREEZE) 10 % CREA      Multiple Vitamins-Minerals (MULTIVITAMIN WITH MINERALS) tablet Take 1 tablet by mouth daily.     nitroGLYCERIN  (NITROSTAT ) 0.4 MG SL tablet Place 1 tablet (0.4 mg total) under the tongue every 5 (five) minutes x 3 doses as needed for chest pain (if no relief after 2nd dose proceed to ED or call 911). 25 tablet 3   pantoprazole  (PROTONIX ) 40 MG tablet TAKE 1 TABLET(40 MG) BY MOUTH TWICE DAILY 180 tablet 3   polyethylene glycol powder (GLYCOLAX/MIRALAX) 17 GM/SCOOP powder Take by mouth once.     Psyllium (DAILY FIBER PO) Take by mouth.     revefenacin  (YUPELRI ) 175 MCG/3ML nebulizer solution Take 3 mLs (175 mcg total) by nebulization daily. 90 mL 11   tamsulosin  (FLOMAX ) 0.4 MG CAPS capsule Take 0.4 mg by mouth at bedtime.     ipratropium-albuterol  (DUONEB) 0.5-2.5 (3) MG/3ML SOLN Take 3 mLs by nebulization every 4 (four) hours as needed. (Patient not taking: Reported on 07/07/2024) 360 mL 0   No current  facility-administered medications for this visit.    REVIEW OF SYSTEMS:   Constitutional: Denies fevers, chills or abnormal night sweats Eyes: Denies blurriness of  vision, double vision or watery eyes Ears, nose, mouth, throat, and face: Denies mucositis or sore throat Respiratory: Denies cough, dyspnea or wheezes Cardiovascular: Denies palpitation, chest discomfort or lower extremity swelling Gastrointestinal:  Denies nausea, heartburn or change in bowel habits Skin: Denies abnormal skin rashes Lymphatics: Denies new lymphadenopathy or easy bruising Neurological:Denies numbness, tingling or new weaknesses Behavioral/Psych: Mood is stable, no new changes  All other systems were reviewed with the patient and are negative.  PHYSICAL EXAMINATION: ECOG PERFORMANCE STATUS: 1 - Symptomatic but completely ambulatory  Vitals:   07/07/24 1347  BP: (!) 164/88  Pulse: 84  Resp: 20  Temp: 97.8 F (36.6 C)  SpO2: 97%   Filed Weights   07/07/24 1347  Weight: 186 lb 15.2 oz (84.8 kg)    GENERAL:alert, no distress and comfortable SKIN: skin color, texture, turgor are normal, no rashes or significant lesions EYES: normal, conjunctiva are pink and non-injected, sclera clear OROPHARYNX:no exudate, no erythema and lips, buccal mucosa, and tongue normal  NECK: supple, thyroid  normal size, non-tender, without nodularity LYMPH:  no palpable lymphadenopathy in the cervical, axillary or inguinal LUNGS: clear to auscultation and percussion with normal breathing effort HEART: regular rate & rhythm and no murmurs and no lower extremity edema ABDOMEN:abdomen soft, non-tender and normal bowel sounds Musculoskeletal:no cyanosis of digits and no clubbing  PSYCH: alert & oriented x 3 with fluent speech NEURO: no focal motor/sensory deficits  Physical Exam   LABORATORY DATA:  I have reviewed the data as listed    Latest Ref Rng & Units 06/12/2024    8:41 AM 04/17/2024    9:06 AM 01/15/2024    7:17 AM   CBC  WBC 3.4 - 10.8 x10E3/uL 3.9  4.6  3.8   Hemoglobin 13.0 - 17.7 g/dL 87.9  89.1  9.9   Hematocrit 37.5 - 51.0 % 40.4  36.2  31.0   Platelets 150 - 450 x10E3/uL 171  168  156        Latest Ref Rng & Units 01/15/2024    7:17 AM 01/14/2024    4:43 AM 01/13/2024    6:31 AM  CMP  Glucose 70 - 99 mg/dL 86  94  81   BUN 8 - 23 mg/dL 7  12  13    Creatinine 0.61 - 1.24 mg/dL 8.87  8.96  8.97   Sodium 135 - 145 mmol/L 142  139  142   Potassium 3.5 - 5.1 mmol/L 3.6  3.6  3.2   Chloride 98 - 111 mmol/L 108  107  108   CO2 22 - 32 mmol/L 28  27  27    Calcium  8.9 - 10.3 mg/dL 8.0  8.2  7.9   Total Protein 6.5 - 8.1 g/dL   5.2   Total Bilirubin 0.0 - 1.2 mg/dL   1.3   Alkaline Phos 38 - 126 U/L   74   AST 15 - 41 U/L   13   ALT 0 - 44 U/L   10      RADIOGRAPHIC STUDIES: I have personally reviewed the radiological images as listed and agreed with the findings in the report. No results found.   Assessment & Plan Iron deficiency anemia secondary to GI bleeding Mild iron deficiency anemia with persistent low iron levels despite six months of oral supplementation. Hemoglobin was 12 three weeks ago ferritin and iron saturation. Anemia may slightly contribute to shortness of breath, but COPD and heart disease are likely the main causes. -Deficiency and mild  anemia is related to his recurrent GI bleeding. - Schedule IV iron infusion once a week for the next two to three weeks - Check blood counts and iron levels after infusions - Administer loratadine  prior to infusion to prevent allergic reactions  Lower gastrointestinal bleeding due to colonic polyp Significant rectal bleeding due to a colonic polyp, removed during a colonoscopy in May 2020. No further bleeding episodes reported.  Chronic obstructive pulmonary disease with emphysema COPD with emphysema contributing to significant shortness of breath. Symptoms managed with inhalers and nebulizer treatments. He quit smoking over 30 years ago  but continues to use chewing tobacco.  Coronary artery disease with prior stent placement Coronary artery disease with a history of three myocardial infarctions and stent placements. He is on aspirin  therapy as recommended by his cardiologist.  Hypertension Hypertension managed with losartan . Blood pressure control is important given his history of coronary artery disease.  Plan -I recommend IV Venofer 200 mg weekly for 3 due to his persistent iron deficiency and dyspnea, despite adequate oral iron. - Repeat lab tests in one month after IV iron therapy to assess blood counts and iron levels - Schedule follow-up appointment after lab tests     No orders of the defined types were placed in this encounter.   All questions were answered. The patient knows to call the clinic with any problems, questions or concerns. I spent 25 minutes counseling the patient face to face. The total time spent in the appointment was 30 minutes including review of chart and various tests results, discussions about plan of care and coordination of care plan.     Onita Mattock, MD 07/07/2024 2:33 PM

## 2024-07-07 ENCOUNTER — Inpatient Hospital Stay

## 2024-07-07 ENCOUNTER — Inpatient Hospital Stay: Attending: Hematology | Admitting: Hematology

## 2024-07-07 ENCOUNTER — Encounter: Payer: Self-pay | Admitting: Hematology

## 2024-07-07 VITALS — BP 164/88 | HR 84 | Temp 97.8°F | Resp 20 | Wt 186.9 lb

## 2024-07-07 DIAGNOSIS — K922 Gastrointestinal hemorrhage, unspecified: Secondary | ICD-10-CM | POA: Diagnosis not present

## 2024-07-07 DIAGNOSIS — Z955 Presence of coronary angioplasty implant and graft: Secondary | ICD-10-CM | POA: Insufficient documentation

## 2024-07-07 DIAGNOSIS — D500 Iron deficiency anemia secondary to blood loss (chronic): Secondary | ICD-10-CM | POA: Diagnosis not present

## 2024-07-07 DIAGNOSIS — I119 Hypertensive heart disease without heart failure: Secondary | ICD-10-CM | POA: Diagnosis not present

## 2024-07-07 DIAGNOSIS — J439 Emphysema, unspecified: Secondary | ICD-10-CM | POA: Diagnosis not present

## 2024-07-07 DIAGNOSIS — F17220 Nicotine dependence, chewing tobacco, uncomplicated: Secondary | ICD-10-CM | POA: Insufficient documentation

## 2024-07-07 DIAGNOSIS — I2510 Atherosclerotic heart disease of native coronary artery without angina pectoris: Secondary | ICD-10-CM | POA: Diagnosis not present

## 2024-07-07 DIAGNOSIS — Z7982 Long term (current) use of aspirin: Secondary | ICD-10-CM | POA: Insufficient documentation

## 2024-07-12 ENCOUNTER — Inpatient Hospital Stay

## 2024-07-12 VITALS — BP 132/70 | HR 70 | Temp 97.9°F | Resp 16

## 2024-07-12 DIAGNOSIS — D500 Iron deficiency anemia secondary to blood loss (chronic): Secondary | ICD-10-CM | POA: Diagnosis not present

## 2024-07-12 DIAGNOSIS — Z955 Presence of coronary angioplasty implant and graft: Secondary | ICD-10-CM | POA: Diagnosis not present

## 2024-07-12 DIAGNOSIS — I2510 Atherosclerotic heart disease of native coronary artery without angina pectoris: Secondary | ICD-10-CM | POA: Diagnosis not present

## 2024-07-12 DIAGNOSIS — K922 Gastrointestinal hemorrhage, unspecified: Secondary | ICD-10-CM | POA: Diagnosis not present

## 2024-07-12 DIAGNOSIS — F17220 Nicotine dependence, chewing tobacco, uncomplicated: Secondary | ICD-10-CM | POA: Diagnosis not present

## 2024-07-12 DIAGNOSIS — J439 Emphysema, unspecified: Secondary | ICD-10-CM | POA: Diagnosis not present

## 2024-07-12 MED ORDER — IRON SUCROSE 200 MG IVPB - SIMPLE MED
200.0000 mg | Freq: Once | Status: AC
  Administered 2024-07-12: 200 mg via INTRAVENOUS
  Filled 2024-07-12: qty 200

## 2024-07-12 MED ORDER — ACETAMINOPHEN 325 MG PO TABS
650.0000 mg | ORAL_TABLET | Freq: Once | ORAL | Status: AC
  Administered 2024-07-12: 650 mg via ORAL
  Filled 2024-07-12: qty 2

## 2024-07-12 MED ORDER — CETIRIZINE HCL 10 MG/ML IV SOLN
5.0000 mg | Freq: Once | INTRAVENOUS | Status: AC
  Administered 2024-07-12: 5 mg via INTRAVENOUS
  Filled 2024-07-12: qty 1

## 2024-07-12 MED ORDER — SODIUM CHLORIDE 0.9 % IV SOLN: INTRAVENOUS | Status: DC

## 2024-07-12 NOTE — Progress Notes (Signed)
 Patient presents today for Venofer infusion per providers order.  Vital signs within parameters for treatment.  Patient has no new complaints at this time.  Peripheral IV started and blood returned noted pre and post infusion.  Stable during infusion without adverse affects.  Vital signs stable.  No complaints at this time.  Discharge from clinic ambulatory in stable condition.  Alert and oriented X 3.  Follow up with [facility] as scheduled.

## 2024-07-12 NOTE — Patient Instructions (Signed)

## 2024-07-14 DIAGNOSIS — E782 Mixed hyperlipidemia: Secondary | ICD-10-CM | POA: Diagnosis not present

## 2024-07-14 DIAGNOSIS — I10 Essential (primary) hypertension: Secondary | ICD-10-CM | POA: Diagnosis not present

## 2024-07-14 DIAGNOSIS — N401 Enlarged prostate with lower urinary tract symptoms: Secondary | ICD-10-CM | POA: Diagnosis not present

## 2024-07-14 DIAGNOSIS — J449 Chronic obstructive pulmonary disease, unspecified: Secondary | ICD-10-CM | POA: Diagnosis not present

## 2024-07-17 ENCOUNTER — Other Ambulatory Visit (HOSPITAL_BASED_OUTPATIENT_CLINIC_OR_DEPARTMENT_OTHER): Payer: Self-pay

## 2024-07-19 ENCOUNTER — Inpatient Hospital Stay: Attending: Oncology

## 2024-07-19 VITALS — BP 129/72 | HR 69 | Temp 97.7°F | Resp 18

## 2024-07-19 DIAGNOSIS — I119 Hypertensive heart disease without heart failure: Secondary | ICD-10-CM | POA: Diagnosis not present

## 2024-07-19 DIAGNOSIS — Z7982 Long term (current) use of aspirin: Secondary | ICD-10-CM | POA: Insufficient documentation

## 2024-07-19 DIAGNOSIS — D500 Iron deficiency anemia secondary to blood loss (chronic): Secondary | ICD-10-CM | POA: Diagnosis not present

## 2024-07-19 DIAGNOSIS — J439 Emphysema, unspecified: Secondary | ICD-10-CM | POA: Diagnosis not present

## 2024-07-19 DIAGNOSIS — I2510 Atherosclerotic heart disease of native coronary artery without angina pectoris: Secondary | ICD-10-CM | POA: Insufficient documentation

## 2024-07-19 DIAGNOSIS — Z955 Presence of coronary angioplasty implant and graft: Secondary | ICD-10-CM | POA: Diagnosis not present

## 2024-07-19 DIAGNOSIS — K922 Gastrointestinal hemorrhage, unspecified: Secondary | ICD-10-CM | POA: Diagnosis not present

## 2024-07-19 DIAGNOSIS — F17220 Nicotine dependence, chewing tobacco, uncomplicated: Secondary | ICD-10-CM | POA: Insufficient documentation

## 2024-07-19 MED ORDER — SODIUM CHLORIDE 0.9 % IV SOLN: INTRAVENOUS | Status: DC

## 2024-07-19 MED ORDER — IRON SUCROSE 200 MG IVPB - SIMPLE MED
200.0000 mg | Freq: Once | Status: AC
  Administered 2024-07-19: 200 mg via INTRAVENOUS
  Filled 2024-07-19: qty 200

## 2024-07-19 MED ORDER — CETIRIZINE HCL 10 MG/ML IV SOLN: 5.0000 mg | Freq: Once | INTRAVENOUS | Status: DC

## 2024-07-19 MED ORDER — ACETAMINOPHEN 325 MG PO TABS: 650.0000 mg | ORAL_TABLET | Freq: Once | ORAL | Status: DC

## 2024-07-19 NOTE — Progress Notes (Signed)
 Patient presents today for Venofer, patient reports taking pre-meds at home. Patient tolerated iron infusion with no complaints voiced. Peripheral IV site clean and dry with good blood return noted before and after infusion. Band aid applied. VSS with discharge and left in satisfactory condition with no s/s of distress noted.

## 2024-07-19 NOTE — Patient Instructions (Signed)
 [facility] - A DEPT OF [facility]  Discharge Instructions: Thank you for choosing [facility] to provide your oncology and hematology care.  If you have a lab appointment with the Cancer Center - please note that after April 8th, 2024, all labs will be drawn in the cancer center.  You do not have to check in or register with the main entrance as you have in the past but will complete your check-in in the cancer center.  Wear comfortable clothing and clothing appropriate for easy access to any Portacath or PICC line.   We strive to give you quality time with your provider. You may need to reschedule your appointment if you arrive late (15 or more minutes).  Arriving late affects you and other patients whose appointments are after yours.  Also, if you miss three or more appointments without notifying the office, you may be dismissed from the clinic at the provider's discretion.      For prescription refill requests, have your pharmacy contact our office and allow 72 hours for refills to be completed.    Today you received the following Venofer , return as scheduled.   To help prevent nausea and vomiting after your treatment, we encourage you to take your nausea medication as directed.  BELOW ARE SYMPTOMS THAT SHOULD BE REPORTED IMMEDIATELY: *FEVER GREATER THAN 100.4 F (38 C) OR HIGHER *CHILLS OR SWEATING *NAUSEA AND VOMITING THAT IS NOT CONTROLLED WITH YOUR NAUSEA MEDICATION *UNUSUAL SHORTNESS OF BREATH *UNUSUAL BRUISING OR BLEEDING *URINARY PROBLEMS (pain or burning when urinating, or frequent urination) *BOWEL PROBLEMS (unusual diarrhea, constipation, pain near the anus) TENDERNESS IN MOUTH AND THROAT WITH OR WITHOUT PRESENCE OF ULCERS (sore throat, sores in mouth, or a toothache) UNUSUAL RASH, SWELLING OR PAIN  UNUSUAL VAGINAL DISCHARGE OR ITCHING   Items with * indicate a potential emergency and should be followed up as soon as possible or  go to the Emergency Department if any problems should occur.  Please show the CHEMOTHERAPY ALERT CARD or IMMUNOTHERAPY ALERT CARD at check-in to the Emergency Department and triage nurse.  Should you have questions after your visit or need to cancel or reschedule your appointment, please contact [facility] [place] - A DEPT OF [facility] [phone]  and follow the prompts.  Office hours are 8:00 a.m. to 4:30 p.m. Monday - Friday. Please note that voicemails left after 4:00 p.m. may not be returned until the following business day.  We are closed weekends and major holidays. You have access to a nurse at all times for urgent questions. Please call the main number to the clinic [phone] and follow the prompts.  For any non-urgent questions, you may also contact your provider using MyChart. We now offer e-Visits for anyone 59 and older to request care online for non-urgent symptoms. For details visit [email or web address].   Also download the MyChart app! Go to the app store, search MyChart, open the app, select [facility], and log in with your MyChart username and password.

## 2024-07-20 ENCOUNTER — Encounter (INDEPENDENT_AMBULATORY_CARE_PROVIDER_SITE_OTHER): Payer: Self-pay | Admitting: Gastroenterology

## 2024-07-21 DIAGNOSIS — I10 Essential (primary) hypertension: Secondary | ICD-10-CM | POA: Diagnosis not present

## 2024-07-21 DIAGNOSIS — N1831 Chronic kidney disease, stage 3a: Secondary | ICD-10-CM | POA: Diagnosis not present

## 2024-07-21 DIAGNOSIS — E539 Vitamin B deficiency, unspecified: Secondary | ICD-10-CM | POA: Diagnosis not present

## 2024-07-21 DIAGNOSIS — Z8719 Personal history of other diseases of the digestive system: Secondary | ICD-10-CM | POA: Diagnosis not present

## 2024-07-24 DIAGNOSIS — J309 Allergic rhinitis, unspecified: Secondary | ICD-10-CM | POA: Diagnosis not present

## 2024-07-24 DIAGNOSIS — I10 Essential (primary) hypertension: Secondary | ICD-10-CM | POA: Diagnosis not present

## 2024-07-24 DIAGNOSIS — E782 Mixed hyperlipidemia: Secondary | ICD-10-CM | POA: Diagnosis not present

## 2024-07-24 DIAGNOSIS — Z0001 Encounter for general adult medical examination with abnormal findings: Secondary | ICD-10-CM | POA: Diagnosis not present

## 2024-07-24 DIAGNOSIS — E876 Hypokalemia: Secondary | ICD-10-CM | POA: Diagnosis not present

## 2024-07-24 DIAGNOSIS — Z0000 Encounter for general adult medical examination without abnormal findings: Secondary | ICD-10-CM | POA: Diagnosis not present

## 2024-07-24 DIAGNOSIS — Z8719 Personal history of other diseases of the digestive system: Secondary | ICD-10-CM | POA: Diagnosis not present

## 2024-07-24 DIAGNOSIS — Z23 Encounter for immunization: Secondary | ICD-10-CM | POA: Diagnosis not present

## 2024-07-24 DIAGNOSIS — E539 Vitamin B deficiency, unspecified: Secondary | ICD-10-CM | POA: Diagnosis not present

## 2024-07-24 DIAGNOSIS — N1831 Chronic kidney disease, stage 3a: Secondary | ICD-10-CM | POA: Diagnosis not present

## 2024-07-24 DIAGNOSIS — J449 Chronic obstructive pulmonary disease, unspecified: Secondary | ICD-10-CM | POA: Diagnosis not present

## 2024-07-24 DIAGNOSIS — I129 Hypertensive chronic kidney disease with stage 1 through stage 4 chronic kidney disease, or unspecified chronic kidney disease: Secondary | ICD-10-CM | POA: Diagnosis not present

## 2024-07-26 ENCOUNTER — Inpatient Hospital Stay

## 2024-07-26 VITALS — BP 156/83 | HR 64 | Temp 96.8°F | Resp 18

## 2024-07-26 DIAGNOSIS — J439 Emphysema, unspecified: Secondary | ICD-10-CM | POA: Diagnosis not present

## 2024-07-26 DIAGNOSIS — K922 Gastrointestinal hemorrhage, unspecified: Secondary | ICD-10-CM | POA: Diagnosis not present

## 2024-07-26 DIAGNOSIS — I2510 Atherosclerotic heart disease of native coronary artery without angina pectoris: Secondary | ICD-10-CM | POA: Diagnosis not present

## 2024-07-26 DIAGNOSIS — Z955 Presence of coronary angioplasty implant and graft: Secondary | ICD-10-CM | POA: Diagnosis not present

## 2024-07-26 DIAGNOSIS — D500 Iron deficiency anemia secondary to blood loss (chronic): Secondary | ICD-10-CM

## 2024-07-26 DIAGNOSIS — F17220 Nicotine dependence, chewing tobacco, uncomplicated: Secondary | ICD-10-CM | POA: Diagnosis not present

## 2024-07-26 MED ORDER — IRON SUCROSE 200 MG IVPB - SIMPLE MED
200.0000 mg | Freq: Once | Status: AC
  Administered 2024-07-26: 200 mg via INTRAVENOUS
  Filled 2024-07-26: qty 200

## 2024-07-26 MED ORDER — SODIUM CHLORIDE 0.9 % IV SOLN: INTRAVENOUS | Status: DC

## 2024-07-26 NOTE — Progress Notes (Signed)
 Patient tolerated iron infusion with no complaints voiced.  Peripheral IV site clean and dry with good blood return noted before and after infusion.  Band aid applied.  VSS with discharge and left in satisfactory condition with no s/s of distress noted.

## 2024-07-26 NOTE — Progress Notes (Signed)
 Patient took pre-meds today prior to coming into clinic today.

## 2024-07-26 NOTE — Patient Instructions (Signed)

## 2024-08-01 DIAGNOSIS — E539 Vitamin B deficiency, unspecified: Secondary | ICD-10-CM | POA: Diagnosis not present

## 2024-08-01 DIAGNOSIS — G4700 Insomnia, unspecified: Secondary | ICD-10-CM | POA: Diagnosis not present

## 2024-08-01 DIAGNOSIS — I2510 Atherosclerotic heart disease of native coronary artery without angina pectoris: Secondary | ICD-10-CM | POA: Diagnosis not present

## 2024-08-01 DIAGNOSIS — K219 Gastro-esophageal reflux disease without esophagitis: Secondary | ICD-10-CM | POA: Diagnosis not present

## 2024-08-01 DIAGNOSIS — E782 Mixed hyperlipidemia: Secondary | ICD-10-CM | POA: Diagnosis not present

## 2024-08-01 DIAGNOSIS — I2699 Other pulmonary embolism without acute cor pulmonale: Secondary | ICD-10-CM | POA: Diagnosis not present

## 2024-08-01 DIAGNOSIS — N401 Enlarged prostate with lower urinary tract symptoms: Secondary | ICD-10-CM | POA: Diagnosis not present

## 2024-08-01 DIAGNOSIS — I7143 Infrarenal abdominal aortic aneurysm, without rupture: Secondary | ICD-10-CM | POA: Diagnosis not present

## 2024-08-01 DIAGNOSIS — R630 Anorexia: Secondary | ICD-10-CM | POA: Diagnosis not present

## 2024-08-01 DIAGNOSIS — J309 Allergic rhinitis, unspecified: Secondary | ICD-10-CM | POA: Diagnosis not present

## 2024-08-01 DIAGNOSIS — J449 Chronic obstructive pulmonary disease, unspecified: Secondary | ICD-10-CM | POA: Diagnosis not present

## 2024-08-01 DIAGNOSIS — I10 Essential (primary) hypertension: Secondary | ICD-10-CM | POA: Diagnosis not present

## 2024-08-07 ENCOUNTER — Other Ambulatory Visit: Payer: Self-pay

## 2024-08-07 DIAGNOSIS — D500 Iron deficiency anemia secondary to blood loss (chronic): Secondary | ICD-10-CM

## 2024-08-08 ENCOUNTER — Inpatient Hospital Stay

## 2024-08-08 ENCOUNTER — Inpatient Hospital Stay (HOSPITAL_BASED_OUTPATIENT_CLINIC_OR_DEPARTMENT_OTHER): Admitting: Oncology

## 2024-08-08 VITALS — BP 129/67 | HR 87 | Temp 97.0°F | Resp 18 | Ht 72.0 in | Wt 184.0 lb

## 2024-08-08 DIAGNOSIS — F17220 Nicotine dependence, chewing tobacco, uncomplicated: Secondary | ICD-10-CM | POA: Diagnosis not present

## 2024-08-08 DIAGNOSIS — D500 Iron deficiency anemia secondary to blood loss (chronic): Secondary | ICD-10-CM

## 2024-08-08 DIAGNOSIS — I2510 Atherosclerotic heart disease of native coronary artery without angina pectoris: Secondary | ICD-10-CM | POA: Diagnosis not present

## 2024-08-08 DIAGNOSIS — Z955 Presence of coronary angioplasty implant and graft: Secondary | ICD-10-CM | POA: Diagnosis not present

## 2024-08-08 DIAGNOSIS — K922 Gastrointestinal hemorrhage, unspecified: Secondary | ICD-10-CM | POA: Diagnosis not present

## 2024-08-08 DIAGNOSIS — J439 Emphysema, unspecified: Secondary | ICD-10-CM | POA: Diagnosis not present

## 2024-08-08 LAB — COMPREHENSIVE METABOLIC PANEL WITH GFR
ALT: 12 U/L (ref 0–44)
AST: 17 U/L (ref 15–41)
Albumin: 4.3 g/dL (ref 3.5–5.0)
Alkaline Phosphatase: 96 U/L (ref 38–126)
Anion gap: 7 (ref 5–15)
BUN: 14 mg/dL (ref 8–23)
CO2: 29 mmol/L (ref 22–32)
Calcium: 9.1 mg/dL (ref 8.9–10.3)
Chloride: 109 mmol/L (ref 98–111)
Creatinine, Ser: 1.3 mg/dL — ABNORMAL HIGH (ref 0.61–1.24)
GFR, Estimated: 54 mL/min — ABNORMAL LOW (ref >60)
Glucose, Bld: 82 mg/dL (ref 70–99)
Potassium: 4 mmol/L (ref 3.5–5.1)
Sodium: 144 mmol/L (ref 135–145)
Total Bilirubin: 0.9 mg/dL (ref 0.0–1.2)
Total Protein: 6.3 g/dL — ABNORMAL LOW (ref 6.5–8.1)

## 2024-08-08 LAB — CBC
HCT: 45.8 % (ref 39.0–52.0)
Hemoglobin: 14.9 g/dL (ref 13.0–17.0)
MCH: 28.5 pg (ref 26.0–34.0)
MCHC: 32.5 g/dL (ref 30.0–36.0)
MCV: 87.6 fL (ref 80.0–100.0)
Platelets: 142 K/uL — ABNORMAL LOW (ref 150–400)
RBC: 5.23 MIL/uL (ref 4.22–5.81)
RDW: 17.1 % — ABNORMAL HIGH (ref 11.5–15.5)
WBC: 4 K/uL (ref 4.0–10.5)
nRBC: 0 % (ref 0.0–0.2)

## 2024-08-08 LAB — FERRITIN: Ferritin: 148 ng/mL (ref 24–336)

## 2024-08-08 LAB — IRON AND TIBC
Iron: 130 ug/dL (ref 45–182)
Saturation Ratios: 47 % — ABNORMAL HIGH (ref 17.9–39.5)
TIBC: 277 ug/dL (ref 250–450)
UIBC: 147 ug/dL

## 2024-08-08 NOTE — Assessment & Plan Note (Addendum)
 Mild iron  deficiency anemia with persistent low iron  levels despite six months of oral supplementation.  Anemia likely secondary to chronic GI bleed.  He denies any melena, hematochezia or bright red blood per rectum. He received 3 doses of 200 mg IV Venofer  on 07/12/2024, 07/19/2024 and 07/26/2024 with great tolerance. Repeat labs from today show improvement of iron  levels and hemoglobin. No additional IV iron  needed at this time.  -Return to clinic in 3 months with labs a few days before.

## 2024-08-08 NOTE — Progress Notes (Signed)
 [name] OFFICE PROGRESS NOTE  Shona Norleen PEDLAR, MD  ASSESSMENT & PLAN:    Assessment & Plan Iron  deficiency anemia due to chronic blood loss Mild iron  deficiency anemia with persistent low iron  levels despite six months of oral supplementation.  Anemia likely secondary to chronic GI bleed.  He denies any melena, hematochezia or bright red blood per rectum. He received 3 doses of 200 mg IV Venofer  on 07/12/2024, 07/19/2024 and 07/26/2024 with great tolerance. Repeat labs from today show improvement of iron  levels and hemoglobin. No additional IV iron  needed at this time.  -Return to clinic in 3 months with labs a few days before.    Orders Placed This Encounter  Procedures   Ferritin    Standing Status:   Future    Expected Date:   11/08/2024    Expiration Date:   02/06/2025    Release to patient:   Immediate   Iron  and TIBC    Standing Status:   Future    Expected Date:   11/08/2024    Expiration Date:   02/06/2025    Release to patient:   Immediate   CBC    Standing Status:   Future    Expected Date:   11/08/2024    Expiration Date:   02/06/2025   Comprehensive metabolic panel with GFR    Standing Status:   Future    Expected Date:   11/08/2024    Expiration Date:   02/06/2025    Release to patient:   Immediate    INTERVAL HISTORY: Patient returns for follow-up for iron  deficiency anemia secondary to chronic GI bleed.   He has a history of rectal bleeding, initially severe and leading to hospitalization a few years ago, attributed to ibuprofen  use. Despite discontinuing ibuprofen , another episode occurred, and a colonoscopy in May 2020 revealed a few polyps. He has not experienced rectal bleeding since May 2025. He has not needed any blood transfusion.   He also has COPD and emphysema, contributing to shortness of breath. He uses a nebulizer and has oxygen  available to improve walking endurance. He quit smoking over 30 years ago but continues to use chewing  tobacco.   Current medications include an inhaler, pantoprazole , atorvastatin , losartan , iron  pills, a multivitamin, and Flomax . He previously took simvastatin , which was changed due to breathing issues.   No current rectal bleeding, and stools are not dark or black. He does not currently use oxygen  but has it available if needed.  Reports he feels significantly improved after IV iron .   We reviewed cbc, cmp, ferritin, iron  panel.   SUMMARY OF HEMATOLOGIC HISTORY: Oncology History   No history exists.     CBC    Component Value Date/Time   WBC 4.0 08/08/2024 1007   RBC 5.23 08/08/2024 1007   HGB 14.9 08/08/2024 1007   HGB 12.0 (L) 06/12/2024 0841   HCT 45.8 08/08/2024 1007   HCT 40.4 06/12/2024 0841   PLT 142 (L) 08/08/2024 1007   PLT 171 06/12/2024 0841   MCV 87.6 08/08/2024 1007   MCV 83 06/12/2024 0841   MCH 28.5 08/08/2024 1007   MCHC 32.5 08/08/2024 1007   RDW 17.1 (H) 08/08/2024 1007   RDW 17.8 (H) 06/12/2024 0841   LYMPHSABS 1.1 06/12/2024 0841   MONOABS 0.3 04/30/2023 0450   EOSABS 0.2 06/12/2024 0841   BASOSABS 0.0 06/12/2024 0841       Latest Ref Rng & Units 08/08/2024   10:07 AM 01/15/2024  7:17 AM 01/14/2024    4:43 AM  CMP  Glucose 70 - 99 mg/dL 82  86  94   BUN 8 - 23 mg/dL 14  7  12    Creatinine 0.61 - 1.24 mg/dL 8.69  8.87  8.96   Sodium 135 - 145 mmol/L 144  142  139   Potassium 3.5 - 5.1 mmol/L 4.0  3.6  3.6   Chloride 98 - 111 mmol/L 109  108  107   CO2 22 - 32 mmol/L 29  28  27    Calcium  8.9 - 10.3 mg/dL 9.1  8.0  8.2   Total Protein 6.5 - 8.1 g/dL 6.3     Total Bilirubin 0.0 - 1.2 mg/dL 0.9     Alkaline Phos 38 - 126 U/L 96     AST 15 - 41 U/L 17     ALT 0 - 44 U/L 12        Lab Results  Component Value Date   FERRITIN 148 08/08/2024   VITAMINB12 607 06/12/2024    Vitals:   08/08/24 1117  BP: 129/67  Pulse: 87  Resp: 18  Temp: (!) 97 F (36.1 C)  SpO2: 94%    Review of System:  Review of Systems  Constitutional:   Negative for fever and malaise/fatigue.  Respiratory:  Positive for shortness of breath.   Gastrointestinal:  Negative for abdominal pain, constipation, diarrhea, nausea and vomiting.  Neurological:  Positive for headaches.    Physical Exam: Physical Exam Constitutional:      Appearance: Normal appearance.  HENT:     Head: Normocephalic and atraumatic.  Eyes:     Pupils: Pupils are equal, round, and reactive to light.  Cardiovascular:     Rate and Rhythm: Normal rate and regular rhythm.     Heart sounds: Normal heart sounds. No murmur heard. Pulmonary:     Effort: Pulmonary effort is normal.     Breath sounds: Normal breath sounds. No wheezing.  Abdominal:     General: Bowel sounds are normal. There is no distension.     Palpations: Abdomen is soft.     Tenderness: There is no abdominal tenderness.  Musculoskeletal:        General: Normal range of motion.     Cervical back: Normal range of motion.  Skin:    General: Skin is warm and dry.     Findings: No rash.  Neurological:     Mental Status: He is alert and oriented to person, place, and time.     Gait: Gait is intact.  Psychiatric:        Mood and Affect: Mood and affect normal.        Cognition and Memory: Memory normal.        Judgment: Judgment normal.      I spent 20 minutes dedicated to the care of this patient (face-to-face and non-face-to-face) on the date of the encounter to include what is described in the assessment and plan.,  Delon Hope, NP 08/08/2024 12:43 PM

## 2024-08-13 DIAGNOSIS — J449 Chronic obstructive pulmonary disease, unspecified: Secondary | ICD-10-CM | POA: Diagnosis not present

## 2024-08-13 DIAGNOSIS — I10 Essential (primary) hypertension: Secondary | ICD-10-CM | POA: Diagnosis not present

## 2024-08-13 DIAGNOSIS — N401 Enlarged prostate with lower urinary tract symptoms: Secondary | ICD-10-CM | POA: Diagnosis not present

## 2024-08-13 DIAGNOSIS — E782 Mixed hyperlipidemia: Secondary | ICD-10-CM | POA: Diagnosis not present

## 2024-08-29 DIAGNOSIS — K219 Gastro-esophageal reflux disease without esophagitis: Secondary | ICD-10-CM | POA: Diagnosis not present

## 2024-08-29 DIAGNOSIS — E539 Vitamin B deficiency, unspecified: Secondary | ICD-10-CM | POA: Diagnosis not present

## 2024-08-29 DIAGNOSIS — N401 Enlarged prostate with lower urinary tract symptoms: Secondary | ICD-10-CM | POA: Diagnosis not present

## 2024-08-29 DIAGNOSIS — I7143 Infrarenal abdominal aortic aneurysm, without rupture: Secondary | ICD-10-CM | POA: Diagnosis not present

## 2024-08-29 DIAGNOSIS — I2699 Other pulmonary embolism without acute cor pulmonale: Secondary | ICD-10-CM | POA: Diagnosis not present

## 2024-08-29 DIAGNOSIS — G4700 Insomnia, unspecified: Secondary | ICD-10-CM | POA: Diagnosis not present

## 2024-08-29 DIAGNOSIS — J449 Chronic obstructive pulmonary disease, unspecified: Secondary | ICD-10-CM | POA: Diagnosis not present

## 2024-08-29 DIAGNOSIS — I2510 Atherosclerotic heart disease of native coronary artery without angina pectoris: Secondary | ICD-10-CM | POA: Diagnosis not present

## 2024-08-29 DIAGNOSIS — E782 Mixed hyperlipidemia: Secondary | ICD-10-CM | POA: Diagnosis not present

## 2024-08-29 DIAGNOSIS — R609 Edema, unspecified: Secondary | ICD-10-CM | POA: Diagnosis not present

## 2024-08-29 DIAGNOSIS — J309 Allergic rhinitis, unspecified: Secondary | ICD-10-CM | POA: Diagnosis not present

## 2024-08-29 DIAGNOSIS — I10 Essential (primary) hypertension: Secondary | ICD-10-CM | POA: Diagnosis not present

## 2024-10-02 ENCOUNTER — Telehealth: Payer: Self-pay

## 2024-10-02 NOTE — Telephone Encounter (Signed)
 Copied from CRM [id number]. Topic: Clinical - Order For Equipment >> Oct 02, 2024 11:44 AM Rilla NOVAK wrote: Reason for CRM: Patient would like an order for a portable oxygen  concentrator. Patient states they use to use [employer] and believes they have been bought out and she is not impressed with that company.  Any questions [phone]  Pt needs walk test appt to qualify for POC in [place] please scd.

## 2024-10-03 NOTE — Telephone Encounter (Signed)
 LVM for patient to call and discuss the walk test he needs to qualify for POC

## 2024-10-11 NOTE — Telephone Encounter (Signed)
 LVM for patient to call and discuss scheduling appointment with Dr. Darlean to qualify for POC that he has requested

## 2024-10-12 NOTE — Telephone Encounter (Signed)
 Called and spoke with Bruna (spouse on DPR).  Mr. Wherry is coming tomorrow. Friday 10/13/24 at 8:45 am to see Dr. Darlean

## 2024-10-12 NOTE — Telephone Encounter (Signed)
 Copied from CRM #[id number]. Topic: Clinical - Order For Equipment >> Oct 11, 2024  4:39 PM Rilla B wrote: Reason for CRM: Patient returning call from office regarding portable oxygen  concentrator.  Please call patient @   Called and spoke with wife Bruna in [place] -states he did the POC eval at the company [employer] they where very unprofessional and rude, and stated that he would not benefit from a POC. Pt and wife both disagreed with [employer] employee - pt needs appt POC walk test

## 2024-10-13 ENCOUNTER — Encounter: Payer: Self-pay | Admitting: Internal Medicine

## 2024-10-13 ENCOUNTER — Ambulatory Visit: Admitting: Internal Medicine

## 2024-10-13 VITALS — BP 128/74 | HR 80 | Ht 72.0 in | Wt 191.0 lb

## 2024-10-13 DIAGNOSIS — Z87891 Personal history of nicotine dependence: Secondary | ICD-10-CM | POA: Diagnosis not present

## 2024-10-13 DIAGNOSIS — R0902 Hypoxemia: Secondary | ICD-10-CM

## 2024-10-13 DIAGNOSIS — J449 Chronic obstructive pulmonary disease, unspecified: Secondary | ICD-10-CM | POA: Diagnosis not present

## 2024-10-13 NOTE — Patient Instructions (Addendum)
 Add budesonide  to the AM and PM treatments    Slow your pace a bit with walking    Please schedule a follow up visit in 3 months but call sooner if needed

## 2024-10-14 NOTE — Assessment & Plan Note (Addendum)
 05/08/2024   Walked on RA  x  2  lap(s) =  approx 300  ft  @ mod pace, stopped due to legs hurt > sob  with lowest 02 sats 87% resolved on 2lpm cont flow and able to do another 150 ft   with lowest sats = 96% and no sob or leg pains   - 10/13/2024   Walked on RA   x  2  lap(s) =  approx 300  ft  @ mod   pace, stopped due to sob with lowest 02 sats 90%   At this point not limited by ex hypoxemia so no need for portable system but advised:  .Make sure you check your oxygen  saturation at your highest level of activity(NOT after you stop)  to be sure it stays over 90% and keep track of it at least once a week, more often if breathing getting worse, and let me know if losing ground. (Collect the dots to connect the dots approach)

## 2024-10-14 NOTE — Assessment & Plan Note (Addendum)
 Quit smoking 1990 /MM - Spirometry 11/08/15   FEV1 1.89 (58%)  Ratio 0.49 p 10% improvement from saba  - 10/13/2021  After extensive coaching inhaler device,  effectiveness =    75% (short Ti)  - d/c acei 11/22/21 > all wheeze resolve by 12/05/2021 ov - PFT's  12/03/21  FEV1 1.76 (58 % ) ratio 0.42  p 10 % improvement from saba p 0 prior to study with DLCO  14.04 (55%) corrects to 2.66  (69%)  for alv volume and FV curve classically concave    - 11/03/2022  After extensive coaching inhaler device,  effectiveness =    80% (slt delayed trigger) - 01/18/2023 flared on breztri /  neb is the only thing that helps  with assoc throat clearing ? Adverse effects of hfa so rec change to neb formoterol /bud   - 02/17/2023 added yupelri   - 04/08/2023 added prn pred x 6 d and if needing freq next step is add budesonide  to neb  - 04/08/2023   Walked on RA  x  2  lap(s) =  approx 300  ft  @  mod pace, stopped due to sob with lowest 02 sats 89%   - 12/07/2023   Walked on RA  x  lap(s) =  appr ox 150  ft  @ mod pace/ akward gait, stopped due to became tired unsteady on feet  with lowest 02 sats 93% and no sob  - 12/06/33   Alpha one AT [facility]  / lelvel 155  Eos 0.2  - 05/08/2024 ex desats documented > see ex hypoxemia   - 10/13/2024 added budesonide  to yupelri / performist    Group E in terms of symptoms/risk so  laba/lama/ICS  therefore appropriate rx at this point >>>  Triple rx per neb   and approp SABA prn.   Each maintenance medication was reviewed in detail including emphasizing most importantly the difference between maintenance and prns and under what circumstances the prns are to be triggered using an action plan format where appropriate.  Total time for H and P, chart review, counseling, reviewing neb/ 02/ pulseo ox  device(s) , directly observing portions of ambulatory 02 saturation study/ and generating customized AVS unique to this office visit / same day charting = 34 min

## 2024-11-28 ENCOUNTER — Inpatient Hospital Stay: Admitting: Oncology

## 2024-11-28 ENCOUNTER — Inpatient Hospital Stay: Attending: Oncology

## 2025-01-05 ENCOUNTER — Ambulatory Visit: Admitting: Internal Medicine
